# Patient Record
Sex: Female | Born: 1935 | Race: Black or African American | Hispanic: No | Smoking: Former smoker
Health system: Southern US, Community
[De-identification: ages and names within clinical notes are randomized; demographics above are authoritative.]

## PROBLEM LIST (undated history)

## (undated) DIAGNOSIS — I10 Essential (primary) hypertension: Secondary | ICD-10-CM

## (undated) DIAGNOSIS — I5032 Chronic diastolic (congestive) heart failure: Secondary | ICD-10-CM

## (undated) DIAGNOSIS — R609 Edema, unspecified: Secondary | ICD-10-CM

## (undated) DIAGNOSIS — C801 Malignant (primary) neoplasm, unspecified: Secondary | ICD-10-CM

## (undated) DIAGNOSIS — J90 Pleural effusion, not elsewhere classified: Secondary | ICD-10-CM

## (undated) DIAGNOSIS — E213 Hyperparathyroidism, unspecified: Secondary | ICD-10-CM

## (undated) DIAGNOSIS — M4624 Osteomyelitis of vertebra, thoracic region: Secondary | ICD-10-CM

## (undated) DIAGNOSIS — E7800 Pure hypercholesterolemia, unspecified: Secondary | ICD-10-CM

## (undated) DIAGNOSIS — M4644 Discitis, unspecified, thoracic region: Secondary | ICD-10-CM

## (undated) DIAGNOSIS — I509 Heart failure, unspecified: Secondary | ICD-10-CM

## (undated) DIAGNOSIS — J189 Pneumonia, unspecified organism: Secondary | ICD-10-CM

## (undated) DIAGNOSIS — R531 Weakness: Secondary | ICD-10-CM

## (undated) DIAGNOSIS — I119 Hypertensive heart disease without heart failure: Secondary | ICD-10-CM

## (undated) DIAGNOSIS — N184 Chronic kidney disease, stage 4 (severe): Secondary | ICD-10-CM

## (undated) DIAGNOSIS — N289 Disorder of kidney and ureter, unspecified: Secondary | ICD-10-CM

## (undated) HISTORY — PX: ROTATOR CUFF REPAIR: SHX139

## (undated) HISTORY — PX: TONSILLECTOMY: SUR1361

## (undated) HISTORY — DX: Osteomyelitis of vertebra, thoracic region: M46.24

## (undated) HISTORY — DX: Discitis, unspecified, thoracic region: M46.44

## (undated) HISTORY — DX: Hyperparathyroidism, unspecified: E21.3

## (undated) HISTORY — DX: Chronic diastolic (congestive) heart failure: I50.32

## (undated) HISTORY — DX: Pleural effusion, not elsewhere classified: J90

## (undated) HISTORY — DX: Hypertensive heart disease without heart failure: I11.9

## (undated) HISTORY — DX: Pneumonia, unspecified organism: J18.9

## (undated) HISTORY — PX: TUBAL LIGATION: SHX77

## (undated) HISTORY — DX: Chronic kidney disease, stage 4 (severe): N18.4

## (undated) HISTORY — DX: Weakness: R53.1

---

## 1998-10-11 ENCOUNTER — Other Ambulatory Visit: Admission: RE | Admit: 1998-10-11 | Discharge: 1998-10-11 | Payer: Self-pay | Admitting: Family Medicine

## 1998-10-19 ENCOUNTER — Ambulatory Visit (HOSPITAL_COMMUNITY): Admission: RE | Admit: 1998-10-19 | Discharge: 1998-10-19 | Payer: Self-pay | Admitting: Gastroenterology

## 1998-12-01 ENCOUNTER — Encounter: Admission: RE | Admit: 1998-12-01 | Discharge: 1999-03-01 | Payer: Self-pay | Admitting: Orthopedic Surgery

## 2000-09-23 ENCOUNTER — Encounter: Admission: RE | Admit: 2000-09-23 | Discharge: 2000-09-23 | Payer: Self-pay | Admitting: Orthopedic Surgery

## 2000-09-23 ENCOUNTER — Encounter: Payer: Self-pay | Admitting: Orthopedic Surgery

## 2000-09-24 ENCOUNTER — Ambulatory Visit (HOSPITAL_BASED_OUTPATIENT_CLINIC_OR_DEPARTMENT_OTHER): Admission: RE | Admit: 2000-09-24 | Discharge: 2000-09-25 | Payer: Self-pay | Admitting: Orthopedic Surgery

## 2000-12-17 ENCOUNTER — Encounter: Payer: Self-pay | Admitting: Emergency Medicine

## 2000-12-17 ENCOUNTER — Inpatient Hospital Stay (HOSPITAL_COMMUNITY): Admission: EM | Admit: 2000-12-17 | Discharge: 2000-12-23 | Payer: Self-pay | Admitting: Emergency Medicine

## 2000-12-18 ENCOUNTER — Encounter: Payer: Self-pay | Admitting: Family Medicine

## 2002-11-06 ENCOUNTER — Encounter: Admission: RE | Admit: 2002-11-06 | Discharge: 2002-11-06 | Payer: Self-pay | Admitting: Family Medicine

## 2002-11-06 ENCOUNTER — Encounter: Payer: Self-pay | Admitting: Family Medicine

## 2002-12-24 ENCOUNTER — Encounter: Admission: RE | Admit: 2002-12-24 | Discharge: 2003-03-24 | Payer: Self-pay

## 2004-08-11 ENCOUNTER — Emergency Department (HOSPITAL_COMMUNITY): Admission: EM | Admit: 2004-08-11 | Discharge: 2004-08-11 | Payer: Self-pay | Admitting: Emergency Medicine

## 2012-04-08 ENCOUNTER — Emergency Department (HOSPITAL_COMMUNITY)
Admission: EM | Admit: 2012-04-08 | Discharge: 2012-04-08 | Disposition: A | Discharge status: Home Health | Payer: 59 | Source: Home / Self Care | Attending: Family Medicine | Admitting: Family Medicine

## 2012-04-08 ENCOUNTER — Encounter (HOSPITAL_COMMUNITY): Payer: Self-pay

## 2012-04-08 DIAGNOSIS — R609 Edema, unspecified: Secondary | ICD-10-CM

## 2012-04-08 DIAGNOSIS — R600 Localized edema: Secondary | ICD-10-CM

## 2012-04-08 HISTORY — DX: Pure hypercholesterolemia, unspecified: E78.00

## 2012-04-08 HISTORY — DX: Essential (primary) hypertension: I10

## 2012-04-08 LAB — POCT I-STAT, CHEM 8
BUN: 25 mg/dL — ABNORMAL HIGH (ref 6–23)
Creatinine, Ser: 1.5 mg/dL — ABNORMAL HIGH (ref 0.50–1.10)
Glucose, Bld: 91 mg/dL (ref 70–99)
Hemoglobin: 12.6 g/dL (ref 12.0–15.0)
Sodium: 143 mEq/L (ref 135–145)
TCO2: 26 mmol/L (ref 0–100)

## 2012-04-08 NOTE — ED Notes (Signed)
Pt c/o edema to bilateral lower extremities, onset 2 weeks ago.   Pt denies SOB.  Pt states they are painful to palpation.  2+ pulses palpated bilaterally.  Pt currently on antihypertensive with HCTZ.

## 2012-04-08 NOTE — ED Provider Notes (Signed)
History     CSN: [id number]  Arrival date & time 04/08/12  1626   None     Chief Complaint  Patient presents with  . Leg Swelling    (Consider location/radiation/quality/duration/timing/severity/associated sxs/prior treatment) Patient is a 78 y.o. female presenting with extremity pain. The history is provided by the patient. No language interpreter was used.  Extremity Pain This is a new problem. The current episode started 2 days ago. The problem occurs constantly. The problem has not changed since onset.Pertinent negatives include no shortness of breath. The symptoms are aggravated by nothing. The symptoms are relieved by nothing. She has tried nothing for the symptoms. The treatment provided no relief.  Pt complains of swelling to both of her feet.  Pt is visiting here from [place].  Pt reports she has been sitting with feet down more often and may have gotten increased salt from fast food she has been eating.   Past Medical History  Diagnosis Date  . Hypertension   . Gout   . Hypercholesteremia   . Diabetes mellitus     Past Surgical History  Procedure Date  . Cesarean section   . Rotator cuff repair   . Tonsillectomy   . Cholecystectomy   . Tubal ligation     No family history on file.  History  Substance Use Topics  . Smoking status: Former Games developer  . Smokeless tobacco: Not on file  . Alcohol Use: No    OB History    Grav Para Term Preterm Abortions TAB SAB Ect Mult Living                  Review of Systems  Respiratory: Negative for cough and shortness of breath.   Musculoskeletal: Positive for joint swelling.  Skin: Negative for rash.  All other systems reviewed and are negative.    Allergies  Motrin  Home Medications   Current Outpatient Rx  Name Route Sig Dispense Refill  . ALLOPURINOL 300 MG PO TABS Oral Take 300 mg by mouth daily.    Marland Kitchen LOSARTAN POTASSIUM 25 MG PO TABS Oral Take 25 mg by mouth daily.    Marland Kitchen METFORMIN HCL 1000 MG PO TABS  Oral Take 1,000 mg by mouth 2 (two) times daily with a meal.    . METOPROLOL SUCCINATE ER 50 MG PO TB24 Oral Take 50 mg by mouth daily. Take with or immediately following a meal.    . SIMVASTATIN 20 MG PO TABS Oral Take 20 mg by mouth every evening.      BP 152/71  Pulse 78  Temp(Src) 98.6 F (37 C) (Oral)  Resp 24  SpO2 97%  Physical Exam  Nursing note and vitals reviewed. Constitutional: She appears well-developed and well-nourished.  HENT:  Head: Normocephalic and atraumatic.  Eyes: Pupils are equal, round, and reactive to light.  Cardiovascular: Normal rate and normal heart sounds.   Pulmonary/Chest: Effort normal.  Abdominal: Soft. Bowel sounds are normal.  Musculoskeletal: Normal range of motion.  Neurological: She is alert.  Skin: Skin is warm.  Psychiatric: She has a normal mood and affect.    ED Course  Procedures (including critical care time)  Labs Reviewed - No data to display No results found.   No diagnosis found.    MDM  Pt advised of labs,  Elevate feet,  Restrict salt.   Pt advised to see her Md to have her BUn and creatine rechecked.        Elson Areas,  PA 04/08/12 1921

## 2012-04-08 NOTE — Discharge Instructions (Signed)
Peripheral Edema You have swelling in your legs (peripheral edema). This swelling is due to excess accumulation of salt and water in your body. Edema may be a sign of heart, kidney or liver disease, or a side effect of a medication. It may also be due to problems in the leg veins. Elevating your legs and using special support stockings may be very helpful, if the cause of the swelling is due to poor venous circulation. Avoid long periods of standing, whatever the cause. Treatment of edema depends on identifying the cause. Chips, pretzels, pickles and other salty foods should be avoided. Restricting salt in your diet is almost always needed. Water pills (diuretics) are often used to remove the excess salt and water from your body via urine. These medicines prevent the kidney from reabsorbing sodium. This increases urine flow. Diuretic treatment may also result in lowering of potassium levels in your body. Potassium supplements may be needed if you have to use diuretics daily. Daily weights can help you keep track of your progress in clearing your edema. You should call your caregiver for follow up care as recommended. SEEK IMMEDIATE MEDICAL CARE IF:   You have increased swelling, pain, redness, or heat in your legs.   You develop shortness of breath, especially when lying down.   You develop chest or abdominal pain, weakness, or fainting.   You have a fever.  Document Released: 12/13/2004 Document Revised: 10/25/2011 Document Reviewed: 11/23/2009 Exit[employer] Patient Information 2012 [employer]. 

## 2012-04-09 NOTE — ED Provider Notes (Signed)
Medical screening examination/treatment/procedure(s) were performed by non-physician practitioner and as supervising physician I was immediately available for consultation/collaboration.   MORENO-COLL,Azrael Huss; MD   Aycen Porreca Moreno-Coll, MD 04/09/12 1114 

## 2013-11-19 ENCOUNTER — Inpatient Hospital Stay (HOSPITAL_COMMUNITY)
Admission: EM | Admit: 2013-11-19 | Discharge: 2013-11-26 | DRG: 519 | Disposition: A | Discharge status: Skilled Nursing Facility | Payer: Medicare HMO | Attending: Internal Medicine | Admitting: Internal Medicine

## 2013-11-19 ENCOUNTER — Encounter (HOSPITAL_COMMUNITY): Payer: Self-pay | Admitting: Emergency Medicine

## 2013-11-19 ENCOUNTER — Emergency Department (HOSPITAL_COMMUNITY): Payer: Medicare HMO

## 2013-11-19 DIAGNOSIS — M549 Dorsalgia, unspecified: Secondary | ICD-10-CM | POA: Diagnosis present

## 2013-11-19 DIAGNOSIS — M519 Unspecified thoracic, thoracolumbar and lumbosacral intervertebral disc disorder: Principal | ICD-10-CM | POA: Diagnosis present

## 2013-11-19 DIAGNOSIS — N184 Chronic kidney disease, stage 4 (severe): Secondary | ICD-10-CM

## 2013-11-19 DIAGNOSIS — Y92009 Unspecified place in unspecified non-institutional (private) residence as the place of occurrence of the external cause: Secondary | ICD-10-CM

## 2013-11-19 DIAGNOSIS — M4640 Discitis, unspecified, site unspecified: Secondary | ICD-10-CM

## 2013-11-19 DIAGNOSIS — Z87891 Personal history of nicotine dependence: Secondary | ICD-10-CM

## 2013-11-19 DIAGNOSIS — I10 Essential (primary) hypertension: Secondary | ICD-10-CM | POA: Diagnosis present

## 2013-11-19 DIAGNOSIS — R7881 Bacteremia: Secondary | ICD-10-CM

## 2013-11-19 DIAGNOSIS — M109 Gout, unspecified: Secondary | ICD-10-CM | POA: Diagnosis present

## 2013-11-19 DIAGNOSIS — IMO0002 Reserved for concepts with insufficient information to code with codable children: Secondary | ICD-10-CM | POA: Diagnosis present

## 2013-11-19 DIAGNOSIS — I129 Hypertensive chronic kidney disease with stage 1 through stage 4 chronic kidney disease, or unspecified chronic kidney disease: Secondary | ICD-10-CM | POA: Diagnosis present

## 2013-11-19 DIAGNOSIS — E1169 Type 2 diabetes mellitus with other specified complication: Secondary | ICD-10-CM | POA: Diagnosis present

## 2013-11-19 DIAGNOSIS — R8290 Unspecified abnormal findings in urine: Secondary | ICD-10-CM | POA: Diagnosis present

## 2013-11-19 DIAGNOSIS — W07XXXA Fall from chair, initial encounter: Secondary | ICD-10-CM | POA: Diagnosis present

## 2013-11-19 DIAGNOSIS — M869 Osteomyelitis, unspecified: Secondary | ICD-10-CM | POA: Diagnosis present

## 2013-11-19 DIAGNOSIS — N179 Acute kidney failure, unspecified: Secondary | ICD-10-CM | POA: Diagnosis not present

## 2013-11-19 DIAGNOSIS — M9080 Osteopathy in diseases classified elsewhere, unspecified site: Secondary | ICD-10-CM | POA: Diagnosis present

## 2013-11-19 DIAGNOSIS — Z9181 History of falling: Secondary | ICD-10-CM

## 2013-11-19 DIAGNOSIS — Z8249 Family history of ischemic heart disease and other diseases of the circulatory system: Secondary | ICD-10-CM

## 2013-11-19 DIAGNOSIS — E876 Hypokalemia: Secondary | ICD-10-CM | POA: Diagnosis present

## 2013-11-19 MED ORDER — ONDANSETRON 4 MG PO TBDP
4.0000 mg | ORAL_TABLET | Freq: Once | ORAL | Status: AC
  Administered 2013-11-19: 4 mg via ORAL
  Filled 2013-11-19: qty 1

## 2013-11-19 NOTE — ED Notes (Addendum)
Presents with lumbar back pain worse with standing began after falling 3 weeks ago, was not seen at doctor for fall, pain has increasing worsened over the last 3 weeks. Nothing makes better.  Pain is associated with weakness in the legs. CMS intact to lower extremities. Denies loss of bowel and bladder. Reports constipation. Pain rated 10/10, pt is tearful.  denies trouble urinating and painful urination

## 2013-11-20 ENCOUNTER — Inpatient Hospital Stay (HOSPITAL_COMMUNITY): Payer: Medicare HMO

## 2013-11-20 ENCOUNTER — Encounter (HOSPITAL_COMMUNITY): Payer: Self-pay | Admitting: *Deleted

## 2013-11-20 DIAGNOSIS — N184 Chronic kidney disease, stage 4 (severe): Secondary | ICD-10-CM | POA: Diagnosis present

## 2013-11-20 DIAGNOSIS — R8290 Unspecified abnormal findings in urine: Secondary | ICD-10-CM | POA: Diagnosis present

## 2013-11-20 DIAGNOSIS — I10 Essential (primary) hypertension: Secondary | ICD-10-CM

## 2013-11-20 DIAGNOSIS — E876 Hypokalemia: Secondary | ICD-10-CM | POA: Diagnosis present

## 2013-11-20 DIAGNOSIS — M549 Dorsalgia, unspecified: Secondary | ICD-10-CM | POA: Diagnosis present

## 2013-11-20 HISTORY — DX: Chronic kidney disease, stage 4 (severe): N18.4

## 2013-11-20 LAB — BASIC METABOLIC PANEL
BUN: 32 mg/dL — ABNORMAL HIGH (ref 6–23)
BUN: 30 mg/dL — ABNORMAL HIGH (ref 6–23)
CHLORIDE: 102 meq/L (ref 96–112)
CHLORIDE: 105 meq/L (ref 96–112)
CO2: 31 meq/L (ref 19–32)
CO2: 27 meq/L (ref 19–32)
Calcium: 10.8 mg/dL — ABNORMAL HIGH (ref 8.4–10.5)
Calcium: 10.1 mg/dL (ref 8.4–10.5)
Creatinine, Ser: 1.85 mg/dL — ABNORMAL HIGH (ref 0.50–1.10)
Creatinine, Ser: 1.77 mg/dL — ABNORMAL HIGH (ref 0.50–1.10)
GFR calc Af Amer: 28 mL/min — ABNORMAL LOW (ref >90)
GFR calc non Af Amer: 24 mL/min — ABNORMAL LOW (ref >90)
GFR calc non Af Amer: 26 mL/min — ABNORMAL LOW (ref >90)
GFR, EST AFRICAN AMERICAN: 30 mL/min — AB (ref >90)
Glucose, Bld: 125 mg/dL — ABNORMAL HIGH (ref 70–99)
Glucose, Bld: 108 mg/dL — ABNORMAL HIGH (ref 70–99)
POTASSIUM: 3.9 meq/L (ref 3.7–5.3)
Potassium: 3.6 mEq/L — ABNORMAL LOW (ref 3.7–5.3)
SODIUM: 144 meq/L (ref 137–147)
SODIUM: 144 meq/L (ref 137–147)

## 2013-11-20 LAB — CBC
HCT: 35.2 % — ABNORMAL LOW (ref 36.0–46.0)
Hemoglobin: 11.4 g/dL — ABNORMAL LOW (ref 12.0–15.0)
MCH: 27.8 pg (ref 26.0–34.0)
MCHC: 32.4 g/dL (ref 30.0–36.0)
MCV: 85.9 fL (ref 78.0–100.0)
PLATELETS: 171 10*3/uL (ref 150–400)
RBC: 4.1 MIL/uL (ref 3.87–5.11)
RDW: 14.8 % (ref 11.5–15.5)
WBC: 9 10*3/uL (ref 4.0–10.5)

## 2013-11-20 LAB — URINALYSIS, ROUTINE W REFLEX MICROSCOPIC
BILIRUBIN URINE: NEGATIVE
GLUCOSE, UA: NEGATIVE mg/dL
Hgb urine dipstick: NEGATIVE
KETONES UR: NEGATIVE mg/dL
Nitrite: NEGATIVE
PH: 5 (ref 5.0–8.0)
Protein, ur: NEGATIVE mg/dL
Specific Gravity, Urine: 1.016 (ref 1.005–1.030)
Urobilinogen, UA: 0.2 mg/dL (ref 0.0–1.0)

## 2013-11-20 LAB — CBC WITH DIFFERENTIAL/PLATELET
BASOS ABS: 0.1 10*3/uL (ref 0.0–0.1)
Basophils Relative: 1 % (ref 0–1)
Eosinophils Absolute: 0.2 10*3/uL (ref 0.0–0.7)
Eosinophils Relative: 3 % (ref 0–5)
HEMATOCRIT: 37.3 % (ref 36.0–46.0)
Hemoglobin: 12.1 g/dL (ref 12.0–15.0)
LYMPHS PCT: 34 % (ref 12–46)
Lymphs Abs: 2.9 10*3/uL (ref 0.7–4.0)
MCH: 27.9 pg (ref 26.0–34.0)
MCHC: 32.4 g/dL (ref 30.0–36.0)
MCV: 86.1 fL (ref 78.0–100.0)
MONO ABS: 0.7 10*3/uL (ref 0.1–1.0)
Monocytes Relative: 8 % (ref 3–12)
NEUTROS ABS: 4.8 10*3/uL (ref 1.7–7.7)
NEUTROS PCT: 55 % (ref 43–77)
Platelets: 202 10*3/uL (ref 150–400)
RBC: 4.33 MIL/uL (ref 3.87–5.11)
RDW: 14.7 % (ref 11.5–15.5)
WBC: 8.7 10*3/uL (ref 4.0–10.5)

## 2013-11-20 LAB — URINE MICROSCOPIC-ADD ON

## 2013-11-20 LAB — HEMOGLOBIN A1C
Hgb A1c MFr Bld: 6.8 % — ABNORMAL HIGH (ref <5.7)
Mean Plasma Glucose: 148 mg/dL — ABNORMAL HIGH (ref <117)

## 2013-11-20 LAB — SEDIMENTATION RATE: SED RATE: 27 mm/h — AB (ref 0–22)

## 2013-11-20 LAB — TSH: TSH: 1.225 u[IU]/mL (ref 0.350–4.500)

## 2013-11-20 LAB — GLUCOSE, CAPILLARY: GLUCOSE-CAPILLARY: 99 mg/dL (ref 70–99)

## 2013-11-20 LAB — PRO B NATRIURETIC PEPTIDE: PRO B NATRI PEPTIDE: 894.4 pg/mL — AB (ref 0–450)

## 2013-11-20 LAB — CG4 I-STAT (LACTIC ACID): LACTIC ACID, VENOUS: 0.68 mmol/L (ref 0.5–2.2)

## 2013-11-20 MED ORDER — VANCOMYCIN HCL IN DEXTROSE 1-5 GM/200ML-% IV SOLN: 1000.0000 mg | INTRAVENOUS | Status: DC

## 2013-11-20 MED ORDER — HYDROCODONE-ACETAMINOPHEN 5-325 MG PO TABS
1.0000 | ORAL_TABLET | ORAL | Status: DC | PRN
  Administered 2013-11-20 (×3): 1 via ORAL
  Administered 2013-11-21: 2 via ORAL
  Administered 2013-11-21: 1 via ORAL
  Administered 2013-11-21 – 2013-11-26 (×18): 2 via ORAL
  Administered 2013-11-26: 1 via ORAL
  Administered 2013-11-26: 2 via ORAL
  Filled 2013-11-20 (×12): qty 2
  Filled 2013-11-20 (×2): qty 1
  Filled 2013-11-20 (×3): qty 2
  Filled 2013-11-20: qty 1
  Filled 2013-11-20 (×5): qty 2
  Filled 2013-11-20: qty 1
  Filled 2013-11-20: qty 2

## 2013-11-20 MED ORDER — SODIUM CHLORIDE 0.9 % IV SOLN
INTRAVENOUS | Status: DC
  Administered 2013-11-20 – 2013-11-25 (×4): via INTRAVENOUS

## 2013-11-20 MED ORDER — HYDROMORPHONE HCL PF 1 MG/ML IJ SOLN
0.5000 mg | INTRAMUSCULAR | Status: DC | PRN
  Administered 2013-11-25: 0.5 mg via INTRAVENOUS
  Filled 2013-11-20 (×2): qty 1

## 2013-11-20 MED ORDER — ENOXAPARIN SODIUM 30 MG/0.3ML ~~LOC~~ SOLN
30.0000 mg | Freq: Every day | SUBCUTANEOUS | Status: DC
  Administered 2013-11-20 – 2013-11-21 (×2): 30 mg via SUBCUTANEOUS
  Filled 2013-11-20 (×2): qty 0.3

## 2013-11-20 MED ORDER — ONDANSETRON HCL 4 MG/2ML IJ SOLN
4.0000 mg | Freq: Four times a day (QID) | INTRAMUSCULAR | Status: DC | PRN
  Administered 2013-11-25: 4 mg via INTRAVENOUS
  Filled 2013-11-20: qty 2

## 2013-11-20 MED ORDER — ONDANSETRON HCL 4 MG PO TABS: 4.0000 mg | ORAL_TABLET | Freq: Four times a day (QID) | ORAL | Status: DC | PRN

## 2013-11-20 MED ORDER — SODIUM CHLORIDE 0.9 % IJ SOLN
10.0000 mL | Freq: Two times a day (BID) | INTRAMUSCULAR | Status: DC
  Administered 2013-11-23: 40 mL
  Administered 2013-11-24: 10 mL
  Administered 2013-11-26: 20 mL

## 2013-11-20 MED ORDER — VANCOMYCIN HCL 10 G IV SOLR
1500.0000 mg | Freq: Once | INTRAVENOUS | Status: DC
  Filled 2013-11-20: qty 1500

## 2013-11-20 MED ORDER — MORPHINE SULFATE 4 MG/ML IJ SOLN
4.0000 mg | Freq: Once | INTRAMUSCULAR | Status: AC
  Administered 2013-11-20: 4 mg via INTRAVENOUS
  Filled 2013-11-20: qty 1

## 2013-11-20 MED ORDER — ADULT MULTIVITAMIN W/MINERALS CH
1.0000 | ORAL_TABLET | Freq: Every day | ORAL | Status: DC
  Administered 2013-11-20 – 2013-11-26 (×7): 1 via ORAL
  Filled 2013-11-20 (×8): qty 1

## 2013-11-20 MED ORDER — GLUCERNA SHAKE PO LIQD
237.0000 mL | Freq: Two times a day (BID) | ORAL | Status: DC
  Administered 2013-11-20 – 2013-11-26 (×9): 237 mL via ORAL
  Filled 2013-11-20 (×2): qty 237

## 2013-11-20 MED ORDER — SODIUM CHLORIDE 0.9 % IJ SOLN
10.0000 mL | INTRAMUSCULAR | Status: DC | PRN
  Administered 2013-11-20 – 2013-11-26 (×8): 10 mL

## 2013-11-20 MED ORDER — PIPERACILLIN-TAZOBACTAM 3.375 G IVPB
3.3750 g | Freq: Three times a day (TID) | INTRAVENOUS | Status: DC
  Administered 2013-11-20 – 2013-11-21 (×3): 3.375 g via INTRAVENOUS
  Filled 2013-11-20 (×6): qty 50

## 2013-11-20 NOTE — ED Notes (Signed)
IV team at bedside 

## 2013-11-20 NOTE — Progress Notes (Signed)
INITIAL NUTRITION ASSESSMENT  DOCUMENTATION CODES Per approved criteria  -Morbid Obesity   INTERVENTION: Provide Glucerna Shakes BID in between meals Provide Multivitamin with minerals daily Encourage PO intake  NUTRITION DIAGNOSIS: Inadequate oral intake related to decreased appetite as evidenced by 12% weight loss in 8 months per pt report.   Goal: Pt to meet >/= 90% of their estimated nutrition needs   Monitor:  PO intake Weight Labs  Reason for Assessment: Malnutrition Screening Tool, score of 3  80 y.o. female  Admitting Dx: <principal problem not specified>  ASSESSMENT: 80 year old female with history of hypertension, hypercholesterolemia, diabetes and gout who presents with back pain after a fall that occurred several hours PTA. Per family, pt had three other falls at home this past year and has been progressively weaker.  Pt reports having a poor appetite and eating much less for the past month. She reports losing weight over the past 8 months due to eating less; she states she weighed 260 lbs 8 months ago. Per pt's daughter at bedside pt ate about 30% of breakfast and pt recently started drinking Glucerna Shakes at home due to poor po intake and weight loss. 12% weight loss in 8 months per pt report- not significant for malnutrition criteria.   Height: Ht Readings from Last 1 Encounters:  11/20/13 5\' 2"  (1.575 m)    Weight: Wt Readings from Last 1 Encounters:  11/20/13 228 lb 6.3 oz (103.6 kg)    Ideal Body Weight: 110 lbs  % Ideal Body Weight: 207%  Wt Readings from Last 10 Encounters:  11/20/13 228 lb 6.3 oz (103.6 kg)    Usual Body Weight: 260 lbs  % Usual Body Weight: 88%  BMI:  Body mass index is 41.76 kg/(m^2).  Estimated Nutritional Needs: Kcal: 1700-1860 Protein: 80-90 grams Fluid: > 1.9 L/day  Skin: skin tear on left lower pretibial  Diet Order: General  EDUCATION NEEDS: -No education needs identified at this time   Intake/Output  Summary (Last 24 hours) at 11/20/13 1202 Last data filed at 11/20/13 0400  Gross per 24 hour  Intake    240 ml  Output      0 ml  Net    240 ml    Last BM: 12/32  Labs:   Recent Labs Lab 11/20/13 0130 11/20/13 0530  NA 144 144  K 3.9 3.6*  CL 102 105  CO2 31 27  BUN 32* 30*  CREATININE 1.85* 1.77*  CALCIUM 10.8* 10.1  GLUCOSE 125* 108*    CBG (last 3)   Recent Labs  11/20/13 0422  GLUCAP 99    Scheduled Meds: . enoxaparin (LOVENOX) injection  30 mg Subcutaneous Daily  . piperacillin-tazobactam (ZOSYN)  IV  3.375 g Intravenous Q8H  . sodium chloride  10-40 mL Intracatheter Q12H  . vancomycin  1,500 mg Intravenous Once  . [START ON 11/22/2013] vancomycin  1,000 mg Intravenous Q48H    Continuous Infusions: . sodium chloride      Past Medical History  Diagnosis Date  . Hypertension   . Gout   . Hypercholesteremia   . Diabetes mellitus     Past Surgical History  Procedure Laterality Date  . Cesarean section    . Rotator cuff repair    . Tonsillectomy    . Tubal ligation      Pryor Ochoa RD, LDN Inpatient Clinical Dietitian Pager: [phone] After Hours Pager: [phone]

## 2013-11-20 NOTE — ED Notes (Signed)
IV team attempted IV start without success, as well as PA with ultrasound. Pt without IV access at this time.

## 2013-11-20 NOTE — Progress Notes (Signed)
ANTIBIOTIC CONSULT NOTE - INITIAL  Pharmacy Consult for Vancomycin and Zosyn  Indication: osteomyelitis  Allergies  Allergen Reactions  . Motrin [Ibuprofen] Hives    Patient Measurements: Height: 5\' 2"  (157.5 cm) Weight: 228 lb 6.3 oz (103.6 kg) IBW/kg (Calculated) : 50.1 Adjusted Body Weight: 70 kg  Vital Signs: Temp: 98.4 F (36.9 C) (01/02 0409) Temp src: Oral (01/02 0409) BP: 172/99 mmHg (01/02 0409) Pulse Rate: 88 (01/02 0409) Intake/Output from previous day: 01/01 0701 - 01/02 0700 In: 240 [P.O.:240] Out: -  Intake/Output from this shift: Total I/O In: 240 [P.O.:240] Out: -   Labs:  Recent Labs  11/20/13 0130  WBC 8.7  HGB 12.1  PLT 202  CREATININE 1.85*   Estimated Creatinine Clearance: 26.9 ml/min (by C-G formula based on Cr of 1.85). No results found for this basename: VANCOTROUGH, VANCOPEAK, VANCORANDOM, GENTTROUGH, GENTPEAK, GENTRANDOM, TOBRATROUGH, TOBRAPEAK, TOBRARND, AMIKACINPEAK, AMIKACINTROU, AMIKACIN,  in the last 72 hours   Microbiology: No results found for this or any previous visit (from the past 720 hour(s)).  Medical History: Past Medical History  Diagnosis Date  . Hypertension   . Gout   . Hypercholesteremia   . Diabetes mellitus     Medications:  Prescriptions prior to admission  Medication Sig Dispense Refill  . PRESCRIPTION MEDICATION Take 1 tablet by mouth every morning. Blood Pressure      . traMADol (ULTRAM) 50 MG tablet Take 50 mg by mouth every 6 (six) hours as needed.       Assessment: 80 yo female with back pain and possible osteomyelitis for empiric antibiotics  Goal of Therapy:  Vancomycin trough level 15-20 mcg/ml  Plan:  Vancomycin 1500 mg IV now, then 1 g IV q48h Zosyn 3.375 g IV q8h  Rahn Lacuesta, Bronson Curb 11/20/2013,4:51 AM

## 2013-11-20 NOTE — Progress Notes (Cosign Needed)
[facility] Progress Note [place] TEAM [address]    DABNEY DEVER PQZ:[id number] DOB: [birth year] DOA: 11/19/2013 PCP: No PCP Per Patient  Brief narrative: 80 year old female patient with history of hypertension diabetes and gout who presented with back pain after fall that occurred several hours prior to arrival. Patient described the pain as persistent sharp 10 out of 10 in severity worse with movement. The fall was very minimal and occurred when the patient was attempting to sit down in a recliner chair and did not sit far not back into the chair and slid down out of the chair to the floor. According to the family the patient's had 3 other falls at home this year has become progressively weaker. X-ray in the ER was concerning for possible discitis. It is noted that the patient has had no fever or leukocytosis at presentation.  Assessment/Plan: Active Problems:   Back pain -MRI pending -Patient still with significant back pain but able to maneuver self on to bed pan out any apparent neurological deficit -Review of previous MRI in 2003 revealed patient had significant lower thoracic disc disease with noted disc desiccation and bulging and stenosis -Continue empiric antibiotics for possible discitis    Abnormal urinalysis -Urinalysis appears consistent with UTI -Since no fever or leukocytosis will not begin empiric antibiotics and instead will follow up on culture    CKD (chronic kidney disease) stage 4, GFR 15-29 ml/min -Baseline creatinine unknown    HTN (hypertension)    Hypokalemia -Replete as needed   DVT prophylaxis: Lovenox Code Status: Full Family Communication: Daughter at bedside Disposition Plan/Expected LOS: Remain inpatient until source of back pain clearly delineated Isolation: None Nutritional Status: Stable  Consultants: None  Procedures: None  Antibiotics: Zosyn 1/1 >>> Vancomycin 1/1 >>>  HPI/Subjective: Patient alert and complaining of the same low  back pain.  Objective: Blood pressure 159/61, pulse 89, temperature 99.3 F (37.4 C), temperature source Oral, resp. rate 17, height 5\' 2"  (1.575 m), weight 228 lb 6.3 oz (103.6 kg), SpO2 92.00%.  Intake/Output Summary (Last 24 hours) at 11/20/13 1530 Last data filed at 11/20/13 1400  Gross per 24 hour  Intake 366.67 ml  Output      0 ml  Net 366.67 ml     Exam: Followup exam completed noting patient admitted at 4:31 AM today  Scheduled Meds:  Scheduled Meds: . enoxaparin (LOVENOX) injection  30 mg Subcutaneous Daily  . feeding supplement (GLUCERNA SHAKE)  237 mL Oral BID BM  . multivitamin with minerals  1 tablet Oral Daily  . piperacillin-tazobactam (ZOSYN)  IV  3.375 g Intravenous Q8H  . sodium chloride  10-40 mL Intracatheter Q12H  . vancomycin  1,500 mg Intravenous Once  . [START ON 11/22/2013] vancomycin  1,000 mg Intravenous Q48H   Continuous Infusions: . sodium chloride 50 mL/hr at 11/20/13 1228    **Reviewed in detail by the Attending Physician  Data Reviewed: Basic Metabolic Panel:  Recent Labs Lab 11/20/13 0130 11/20/13 0530  NA 144 144  K 3.9 3.6*  CL 102 105  CO2 31 27  GLUCOSE 125* 108*  BUN 32* 30*  CREATININE 1.85* 1.77*  CALCIUM 10.8* 10.1   Liver Function Tests: No results found for this basename: AST, ALT, ALKPHOS, BILITOT, PROT, ALBUMIN,  in the last 168 hours No results found for this basename: LIPASE, AMYLASE,  in the last 168 hours No results found for this basename: AMMONIA,  in the last 168 hours CBC:  Recent Labs Lab 11/20/13  0130 11/20/13 0530  WBC 8.7 9.0  NEUTROABS 4.8  --   HGB 12.1 11.4*  HCT 37.3 35.2*  MCV 86.1 85.9  PLT 202 171   Cardiac Enzymes: No results found for this basename: CKTOTAL, CKMB, CKMBINDEX, TROPONINI,  in the last 168 hours BNP (last 3 results)  Recent Labs  11/20/13 0530  PROBNP 894.4*   CBG:  Recent Labs Lab 11/20/13 0422  GLUCAP 99    No results found for this or any previous visit  (from the past 240 hour(s)).   Studies:  Recent x-ray studies have been reviewed in detail by the Attending Physician     Erin Hearing, ANP [facility] Office  [phone] Pager [phone]  **If unable to reach the above provider after paging please contact the [place] @ [phone]  On-Call/Text Page:      [email or web address]      password [email or web address]  If 7PM-7AM, please contact night-coverage [email or web address] Password [email or web address] 11/20/2013, 3:30 PM   LOS: 1 day

## 2013-11-20 NOTE — Progress Notes (Signed)
Peripherally Inserted Central Catheter/Midline Placement  The IV Nurse has discussed with the patient and/or persons authorized to consent for the patient, the purpose of this procedure and the potential benefits and risks involved with this procedure.  The benefits include less needle sticks, lab draws from the catheter and patient may be discharged home with the catheter.  Risks include, but not limited to, infection, bleeding, blood clot (thrombus formation), and puncture of an artery; nerve damage and irregular heat beat.  Alternatives to this procedure were also discussed. BY Yarnell  PICC/Midline Placement Documentation  PICC / Midline Double Lumen 11/20/13 PICC Right Cephalic 40 cm 1 cm (Active)  Indication for Insertion or Continuance of Line Poor Vasculature-patient has had multiple peripheral attempts or PIVs lasting less than 24 hours 11/20/2013 11:57 AM  Exposed Catheter (cm) 1 cm 11/20/2013 11:57 AM  Site Assessment Clean;Dry;Intact 11/20/2013 11:57 AM  Lumen #1 Status Flushed;Saline locked;Blood return noted 11/20/2013 11:57 AM  Lumen #2 Status Flushed;Saline locked;Blood return noted 11/20/2013 11:57 AM  Dressing Type Transparent 11/20/2013 11:57 AM  Dressing Intervention New dressing 11/20/2013 11:57 AM  Dressing Change Due 11/27/13 11/20/2013 11:57 AM       Gordan Payment 11/20/2013, 11:59 AM

## 2013-11-20 NOTE — ED Notes (Addendum)
Attempted iv start x2. IV team paged. Pt also placed on bedpan but unable to use.

## 2013-11-20 NOTE — ED Notes (Signed)
Pt returned from xray

## 2013-11-20 NOTE — Plan of Care (Signed)
Problem: Phase I Progression Outcomes Goal: Hemodynamically stable Outcome: Progressing Patient admitted to floor from [facility] ED with back pain s/p multiple falls in recent weeks.  She is alert and oriented x4, in no apparent distress.  She is capable to transferring from stretcher to bed with assistance.  Has back pain 7/10.  Will give PRN analgesics for relief.  Discussed plan of care with patient and daughter.  No concerns voiced.  Will continue to monitor patient condition.

## 2013-11-20 NOTE — H&P (Signed)
[facility] History and Physical  ANTWAN BRIBIESCA YNW:[id number] DOB: 1930/06/08 DOA: 11/19/2013  Referring physician: ED physician PCP: No PCP Per Patient   Chief Complaint: back pain   HPI:  Patient is an 80 year old female with history of hypertension, hypercholesterolemia, diabetes and gout who presents with back pain after a fall that occurred several hours PTA. Pt describes pain as persistent and sharp, 10/10 in severity, worse with movement and no specific alleviating symptoms. NO specific radiating symptom but it does involve almost entire back area. She was with family trying to get into a recliner chair when she was sitting too close to the edge and slid down from the chair. Per family, pt had three other falls at home this past year and has been progressively weaker. Pt denies chest pain, shortness of breath, no specific abdominal or urinary concerns. [facility] asked to admit for further evaluation given findings on XRAY imaging of possible discitis vs osteomyelitis.   Assessment and Plan: Active Problems:   Back pain - ? Discitis/osteomyelitis - needs MRI for further evaluation - this will be done in AM - in the meantime will place on Vancomycin and Zosyn - analgesia as needed, bed rest   Acute renal failure - likely pre renal in etiology - will place on IVF and repeat BMP in AM - encourage PO intake when able to tolerate    HTN - place on Hydralazine scheduled and as needed   Hypercalcemia - IVF as noted above and repeat BMP in AM   DM - will place on SSI for now   Code Status: Full Family Communication: Pt at bedside Disposition Plan:  Admit to medical floor    Review of Systems:  Constitutional: Negative for fever, chills and malaise/fatigue. Negative for diaphoresis.  HENT: Negative for hearing loss, ear pain, nosebleeds, congestion, sore throat, neck pain, tinnitus and ear discharge.   Eyes: Negative for blurred vision, double vision, photophobia, pain, discharge  and redness.  Respiratory: Negative for cough, hemoptysis, sputum production, shortness of breath, wheezing and stridor.   Cardiovascular: Negative for chest pain, palpitations, orthopnea, claudication and leg swelling.  Gastrointestinal: Negative for nausea, vomiting and abdominal pain. Negative for heartburn, constipation, blood in stool and melena.  Genitourinary: Negative for dysuria, urgency, frequency, hematuria and flank pain.  Musculoskeletal: Per HPI  Skin: Negative for itching and rash.  Neurological: Negative for tingling, tremors, sensory change, speech change, focal weakness, loss of consciousness and headaches.  Endo/Heme/Allergies: Negative for environmental allergies and polydipsia. Does not bruise/bleed easily.  Psychiatric/Behavioral: Negative for suicidal ideas. The patient is not nervous/anxious.      Past Medical History  Diagnosis Date  . Hypertension   . Gout   . Hypercholesteremia   . Diabetes mellitus     Past Surgical History  Procedure Laterality Date  . Cesarean section    . Rotator cuff repair    . Tonsillectomy    . Cholecystectomy    . Tubal ligation      Social History:  reports that she has quit smoking. She does not have any smokeless tobacco history on file. She reports that she does not drink alcohol or use illicit drugs.  Allergies  Allergen Reactions  . Motrin [Ibuprofen] Hives    History reviewed. No pertinent family history.  Prior to Admission medications   Medication Sig Start Date End Date Taking? Authorizing Provider  PRESCRIPTION MEDICATION Take 1 tablet by mouth every morning. Blood Pressure   Yes Historical Provider, MD  traMADol (  ULTRAM) 50 MG tablet Take 50 mg by mouth every 6 (six) hours as needed.   Yes Historical Provider, MD    Physical Exam: Filed Vitals:   11/19/13 2308 11/20/13 0116 11/20/13 0305 11/20/13 0409  BP: 191/79  158/74 172/99  Pulse: 88  88 88  Temp: 97.9 F (36.6 C) 99.5 F (37.5 C)  98.4 F (36.9  C)  TempSrc: Oral Oral  Oral  Resp: 20  18 16   Height:    5\' 2"  (1.575 m)  Weight: 99.791 kg (220 lb)   103.6 kg (228 lb 6.3 oz)  SpO2: 96%  95% 94%    Physical Exam  Constitutional: Appears well-developed and well-nourished. No distress.  HENT: Normocephalic. External right and left ear normal. Oropharynx is clear and moist.  Eyes: Conjunctivae and EOM are normal. PERRLA, no scleral icterus.  Neck: Normal ROM. Neck supple. No JVD. No tracheal deviation. No thyromegaly.  CVS: RRR, S1/S2 +, no murmurs, no gallops, no carotid bruit.  Pulmonary: Effort and breath sounds normal, no stridor, rhonchi, wheezes, rales.  Abdominal: Soft. BS +,  no distension, tenderness, rebound or guarding.  Musculoskeletal: Normal range of motion. Diffuse back area tenderness with examination  Lymphadenopathy: No lymphadenopathy noted, cervical, inguinal. Neuro: Alert. Normal reflexes, muscle tone coordination. No cranial nerve deficit. Skin: Skin is warm and dry. No rash noted. Not diaphoretic. No erythema. No pallor.  Psychiatric: Normal mood and affect. Behavior, judgment, thought content normal.   Labs on Admission:  Basic Metabolic Panel:  Recent Labs Lab 11/20/13 0130  NA 144  K 3.9  CL 102  CO2 31  GLUCOSE 125*  BUN 32*  CREATININE 1.85*  CALCIUM 10.8*   CBC:  Recent Labs Lab 11/20/13 0130  WBC 8.7  NEUTROABS 4.8  HGB 12.1  HCT 37.3  MCV 86.1  PLT 202   Radiological Exams on Admission: Dg Thoracic Spine W/swimmers   11/20/2013    1. Highly unusual appearance of T10 and T11, as above, which is concern for potential discitis/osteomyelitis. Given the patient's history of recent fall, these findings could simply be related to acute trauma, however, the marked irregularity of the endplates at this level raises concern for infection.  2. The appearance of the lung parenchyma suggests an underlying interstitial lung disease  Dg Lumbar Spine Complete   11/20/2013    1. The appearance of  T10 and T11 remains concerning for potential discitis/osteomyelitis, and correlation with MRI of the thoracic spine with and without IV gadolinium is strongly recommended.  2. No acute radiographic abnormality of the lumbar spine itself.    EKG: Normal sinus rhythm, no ST/T wave changes  Faye Ramsay, MD  [facility] Pager [phone]  If 7PM-7AM, please contact night-coverage [email or web address] Password [email or web address] 11/20/2013, 4:23 AM

## 2013-11-20 NOTE — ED Notes (Addendum)
Spoke with PA Dammen regarding patient not having IV access. PA explained that admitting MD will be ordering a PICC line placed.

## 2013-11-20 NOTE — ED Provider Notes (Signed)
CSN: [id number]     Arrival date & time 11/19/13  2302 History   First MD Initiated Contact with Patient 11/19/13 2315     Chief Complaint  Patient presents with  . Back Pain   HPI  History provided by the patient and family. Patient is an 80 year old female with history of hypertension, hypercholesterolemia, diabetes and gout who presents with back pain after a fall. Patient reports having a fall earlier this morning with persistent and worsened back pain. She was with family trying to get into a recliner chair when she was sitting too close to the edge and slid down from the chair. She had significant difficulty even with help getting back up due to pains in her back. The family reports that patient has had 3 other recent falls and daily continuous back pain and decreased activity. Patient's first fall was on November 27. At that time she states she was getting up to go to the floor in the living room and "tripped"falling to the ground. She states she had significant back pains and difficulty getting up on her own. If she remained on the floor for approximately 45 minutes but was eventually able to get up. Since then she has been using a walker to help with mobility. She also takes Tylenol for pains which she has been having daily. Her next fall was on December 7 again when she was in the living room walking by the couch. She states her legs just gave out and she went down to the floor. On December 30 she was with family who are helping her up the steps when her leg gave out and they helped her down to the floor. She was having significant difficulty getting up on that occasion as well. She denies any numbness to her legs. She does occasionally have pain down the right posterior leg to the thigh. Denies any urinary or fecal incontinence, urinary retention or perineal numbness. No fever, chills or sweats.   Past Medical History  Diagnosis Date  . Hypertension   . Gout   . Hypercholesteremia   .  Diabetes mellitus    Past Surgical History  Procedure Laterality Date  . Cesarean section    . Rotator cuff repair    . Tonsillectomy    . Cholecystectomy    . Tubal ligation     History reviewed. No pertinent family history. History  Substance Use Topics  . Smoking status: Former Research scientist (life sciences)  . Smokeless tobacco: Not on file  . Alcohol Use: No   OB History   Grav Para Term Preterm Abortions TAB SAB Ect Mult Living                 Review of Systems  Constitutional: Negative for fever, chills and diaphoresis.  Musculoskeletal: Positive for back pain.  Neurological: Positive for weakness. Negative for numbness.  All other systems reviewed and are negative.    Allergies  Motrin  Home Medications   Current Outpatient Rx  Name  Route  Sig  Dispense  Refill  . PRESCRIPTION MEDICATION   Oral   Take 1 tablet by mouth every morning. Blood Pressure         . traMADol (ULTRAM) 50 MG tablet   Oral   Take 50 mg by mouth every 6 (six) hours as needed.          BP 191/79  Pulse 88  Temp(Src) 97.9 F (36.6 C) (Oral)  Resp 20  Wt 220 lb (99.791 kg)  SpO2 96% Physical Exam  Nursing note and vitals reviewed. Constitutional: She is oriented to person, place, and time. She appears well-developed and well-nourished. No distress.  HENT:  Head: Normocephalic.  Cardiovascular: Normal rate and regular rhythm.   Pulmonary/Chest: Effort normal and breath sounds normal. No respiratory distress. She has no wheezes. She has no rales.  Abdominal: Soft.  Musculoskeletal:       Lumbar back: She exhibits tenderness.       Back:  Skin appears normal without erythema. There are some healing excoriations lower on the skin. No other rash. No swelling. no deformity. Patient is significant obese.  Neurological: She is alert and oriented to person, place, and time. No sensory deficit.  Strength equal in lower feet and ankles. Normal sensations to light touch.   Skin: Skin is warm and dry. No  rash noted.  Psychiatric: She has a normal mood and affect. Her behavior is normal.    ED Course  Procedures   DIAGNOSTIC STUDIES: Oxygen Saturation is 96% on room air.    COORDINATION OF CARE:  Nursing notes reviewed. Vital signs reviewed. Initial pt interview and examination performed.   1:07 AM-patient seen and evaluated. Patient laying on her stomach appears uncomfortable. No acute distress. Discussed work up plan with pt at bedside, which includes lab testing admission and MRI later in the morning. Pt agrees with plan.  Patient discussed with attending physician. We'll plan on admission so patient may have MRI and further followup and treatment in the morning.  Spoke with [facility] hospital[facility]. They will see patient and admit. Would like holding orders for MedSurg bed.    Results for orders placed during the hospital encounter of 11/19/13  CBC WITH DIFFERENTIAL      Result Value Range   WBC 8.7  4.0 - 10.5 K/uL   RBC 4.33  3.87 - 5.11 MIL/uL   Hemoglobin 12.1  12.0 - 15.0 g/dL   HCT 37.3  36.0 - 46.0 %   MCV 86.1  78.0 - 100.0 fL   MCH 27.9  26.0 - 34.0 pg   MCHC 32.4  30.0 - 36.0 g/dL   RDW 14.7  11.5 - 15.5 %   Platelets 202  150 - 400 K/uL   Neutrophils Relative % 55  43 - 77 %   Neutro Abs 4.8  1.7 - 7.7 K/uL   Lymphocytes Relative 34  12 - 46 %   Lymphs Abs 2.9  0.7 - 4.0 K/uL   Monocytes Relative 8  3 - 12 %   Monocytes Absolute 0.7  0.1 - 1.0 K/uL   Eosinophils Relative 3  0 - 5 %   Eosinophils Absolute 0.2  0.0 - 0.7 K/uL   Basophils Relative 1  0 - 1 %   Basophils Absolute 0.1  0.0 - 0.1 K/uL  BASIC METABOLIC PANEL      Result Value Range   Sodium 144  137 - 147 mEq/L   Potassium 3.9  3.7 - 5.3 mEq/L   Chloride 102  96 - 112 mEq/L   CO2 31  19 - 32 mEq/L   Glucose, Bld 125 (*) 70 - 99 mg/dL   BUN 32 (*) 6 - 23 mg/dL   Creatinine, Ser 1.85 (*) 0.50 - 1.10 mg/dL   Calcium 10.8 (*) 8.4 - 10.5 mg/dL   GFR calc non Af Amer 24 (*) >90 mL/min   GFR calc Af  Amer 28 (*) >90 mL/min  SEDIMENTATION RATE  Result Value Range   Sed Rate 27 (*) 0 - 22 mm/hr  GLUCOSE, CAPILLARY      Result Value Range   Glucose-Capillary 99  70 - 99 mg/dL   Comment 1 Notify RN    CG4 I-STAT (LACTIC ACID)      Result Value Range   Lactic Acid, Venous 0.68  0.5 - 2.2 mmol/L     Imaging Review Dg Thoracic Spine W/swimmers  11/20/2013   CLINICAL DATA:  History of fall yesterday.  Difficulty moving.  EXAM: THORACIC SPINE - 2 VIEW + SWIMMERS  COMPARISON:  No priors.  FINDINGS: Irregularity of the inferior endplate of O24 and superior endplate of M35 is highly irregular and concerning for discitis/osteomyelitis. There is some compression of both of these vertebral bodies with approximately 50% loss of height at both levels, and mild acute kyphotic deformity centered at T10-T11. There is mild multilevel degenerative disc disease, including what appears to be complete bony fusion between T8 and T9. Incidentally imaged portions of the thorax demonstrate diffuse interstitial prominence throughout the lungs bilaterally, most pronounced in the periphery of the lungs, concerning for the underlying interstitial lung disease.  IMPRESSION: 1. Highly unusual appearance of T10 and T11, as above, which is concern for potential discitis/osteomyelitis. Given the patient's history of recent fall, these findings could simply be related to acute trauma, however, the marked irregularity of the endplates at this level raises concern for infection. Correlation with MRI of the thoracic spine with and without IV gadolinium is recommended. 2. The appearance of the lung parenchyma suggests an underlying interstitial lung disease. This could be better evaluated with followup nonemergent high-resolution chest CT if clinically appropriate. These results were called by telephone at the time of interpretation on 11/20/2013 at 12:23 AM toPETER Dinara Lupu, PA, who verbally acknowledged these results.   Electronically  Signed   By: Vinnie Langton M.D.   On: 11/20/2013 00:25   Dg Lumbar Spine Complete  11/20/2013   CLINICAL DATA:  History of fall complaining of difficulty moving.  EXAM: LUMBAR SPINE - COMPLETE 4+ VIEW  COMPARISON:  No priors.  FINDINGS: Five views of the lumbar spine demonstrate no definite acute displaced fracture or compression type fracture in the lumbar spine. Alignment is anatomic. Mild multilevel degenerative disc disease.  Incidentally imaged portions of the lower thoracic spine again demonstrate marked irregularity of T10 and T11 where there is evidence of bony destruction of the inferior endplate of T61 and superior endplate of W43 with widening of the intervening disc space, highly concerning for discitis and osteomyelitis. At both of these levels there is significant loss of vertebral body height (approximately 50%).  IMPRESSION: 1. The appearance of T10 and T11 remains concerning for potential discitis/osteomyelitis, and correlation with MRI of the thoracic spine with and without IV gadolinium is strongly recommended. 2. No acute radiographic abnormality of the lumbar spine itself.   Electronically Signed   By: Vinnie Langton M.D.   On: 11/20/2013 00:28     MDM   1. Back pain        Martie Lee, PA-C 11/20/13 [phone]

## 2013-11-20 NOTE — ED Provider Notes (Signed)
Medical screening examination/treatment/procedure(s) were performed by non-physician practitioner and as supervising physician I was immediately available for consultation/collaboration.  EKG Interpretation    Date/Time:    Ventricular Rate:    PR Interval:    QRS Duration:   QT Interval:    QTC Calculation:   R Axis:     Text Interpretation:                Hoy Morn, MD 11/20/13 [phone]

## 2013-11-21 DIAGNOSIS — E119 Type 2 diabetes mellitus without complications: Secondary | ICD-10-CM

## 2013-11-21 DIAGNOSIS — M519 Unspecified thoracic, thoracolumbar and lumbosacral intervertebral disc disorder: Secondary | ICD-10-CM

## 2013-11-21 DIAGNOSIS — I129 Hypertensive chronic kidney disease with stage 1 through stage 4 chronic kidney disease, or unspecified chronic kidney disease: Secondary | ICD-10-CM

## 2013-11-21 LAB — CBC
HCT: 34.5 % — ABNORMAL LOW (ref 36.0–46.0)
Hemoglobin: 10.8 g/dL — ABNORMAL LOW (ref 12.0–15.0)
MCH: 27.3 pg (ref 26.0–34.0)
MCHC: 31.3 g/dL (ref 30.0–36.0)
MCV: 87.1 fL (ref 78.0–100.0)
Platelets: 177 10*3/uL (ref 150–400)
RBC: 3.96 MIL/uL (ref 3.87–5.11)
RDW: 15.1 % (ref 11.5–15.5)
WBC: 8.3 10*3/uL (ref 4.0–10.5)

## 2013-11-21 LAB — BASIC METABOLIC PANEL
BUN: 30 mg/dL — AB (ref 6–23)
CHLORIDE: 108 meq/L (ref 96–112)
CO2: 27 mEq/L (ref 19–32)
Calcium: 10 mg/dL (ref 8.4–10.5)
Creatinine, Ser: 1.96 mg/dL — ABNORMAL HIGH (ref 0.50–1.10)
GFR calc non Af Amer: 23 mL/min — ABNORMAL LOW (ref >90)
GFR, EST AFRICAN AMERICAN: 26 mL/min — AB (ref >90)
GLUCOSE: 100 mg/dL — AB (ref 70–99)
Potassium: 3.9 mEq/L (ref 3.7–5.3)
Sodium: 145 mEq/L (ref 137–147)

## 2013-11-21 LAB — URINE CULTURE
Colony Count: 30000
Special Requests: NORMAL

## 2013-11-21 LAB — GLUCOSE, CAPILLARY
GLUCOSE-CAPILLARY: 109 mg/dL — AB (ref 70–99)
GLUCOSE-CAPILLARY: 130 mg/dL — AB (ref 70–99)

## 2013-11-21 LAB — VANCOMYCIN, RANDOM: Vancomycin Rm: <5 ug/mL

## 2013-11-21 MED ORDER — CARVEDILOL 12.5 MG PO TABS
12.5000 mg | ORAL_TABLET | Freq: Two times a day (BID) | ORAL | Status: DC
  Administered 2013-11-21 – 2013-11-24 (×6): 12.5 mg via ORAL
  Filled 2013-11-21 (×10): qty 1

## 2013-11-21 MED ORDER — ENOXAPARIN SODIUM 30 MG/0.3ML ~~LOC~~ SOLN
30.0000 mg | Freq: Every day | SUBCUTANEOUS | Status: DC
  Administered 2013-11-23 – 2013-11-25 (×3): 30 mg via SUBCUTANEOUS
  Filled 2013-11-21 (×3): qty 0.3

## 2013-11-21 MED ORDER — INSULIN ASPART 100 UNIT/ML ~~LOC~~ SOLN: 0.0000 [IU] | Freq: Every day | SUBCUTANEOUS | Status: DC

## 2013-11-21 MED ORDER — HYDRALAZINE HCL 20 MG/ML IJ SOLN
10.0000 mg | INTRAMUSCULAR | Status: DC | PRN
  Administered 2013-11-21 – 2013-11-25 (×6): 10 mg via INTRAVENOUS
  Filled 2013-11-21 (×6): qty 1

## 2013-11-21 MED ORDER — INSULIN ASPART 100 UNIT/ML ~~LOC~~ SOLN
0.0000 [IU] | Freq: Three times a day (TID) | SUBCUTANEOUS | Status: DC
  Administered 2013-11-22 – 2013-11-25 (×6): 1 [IU] via SUBCUTANEOUS
  Administered 2013-11-25: 2 [IU] via SUBCUTANEOUS
  Administered 2013-11-25 – 2013-11-26 (×2): 1 [IU] via SUBCUTANEOUS

## 2013-11-21 MED ORDER — VANCOMYCIN HCL 10 G IV SOLR: 1500.0000 mg | INTRAVENOUS | Status: DC

## 2013-11-21 MED ORDER — VANCOMYCIN HCL 10 G IV SOLR
2000.0000 mg | INTRAVENOUS | Status: AC
  Administered 2013-11-21: 2000 mg via INTRAVENOUS
  Filled 2013-11-21: qty 2000

## 2013-11-21 MED ORDER — VANCOMYCIN HCL 10 G IV SOLR
1500.0000 mg | INTRAVENOUS | Status: DC
  Administered 2013-11-23: 1500 mg via INTRAVENOUS
  Filled 2013-11-21 (×2): qty 1500

## 2013-11-21 MED ORDER — WHITE PETROLATUM GEL
Status: AC
  Administered 2013-11-22: 0.2
  Filled 2013-11-21: qty 5

## 2013-11-21 MED ORDER — FUROSEMIDE 40 MG PO TABS
40.0000 mg | ORAL_TABLET | Freq: Two times a day (BID) | ORAL | Status: DC
  Administered 2013-11-21: 40 mg via ORAL
  Filled 2013-11-21: qty 1

## 2013-11-21 NOTE — Progress Notes (Signed)
Notified Dr Dillard Essex of pt's increased BP (206/75) and that pt requesting to have her CBG monitored.  New orders received, will cont to monitor.

## 2013-11-21 NOTE — Progress Notes (Addendum)
[facility] Progress Note     [name] [name]:[id number] DOB: [birth year] DOA: 11/19/2013 PCP: No PCP Per Patient  Brief narrative: 80 year old female patient with history of hypertension diabetes and gout who presented with back pain after fall that occurred several hours prior to arrival. Patient described the pain as persistent sharp 10 out of 10 in severity worse with movement. The fall was very minimal and occurred when the patient was attempting to sit down in a recliner chair and did not sit far not back into the chair and slid down out of the chair to the floor. According to the family the patient's had 3 other falls at home this year has become progressively weaker. X-ray in the ER was concerning for possible discitis. It is noted that the patient has had no fever or leukocytosis at presentation.  Assessment/Plan: Active Problems:   Back pain/T10-T11 discitis/osteo -MRI of thoracic spine with severe chronic-appearing T10-T11 discitis osteomyelitis was discharged on a T10 and T11 vertebral bodies, extension of the dorsal epidural without discrete abscess. Moderate canal stenosis at T10-11 with severe neural foramina all narrowing-please see full MRI report for details -Patient had been started on empiric antibiotics on 1/1 but following consultation with ID with a discussion of the above MRI results Dr. Baxter Flattery recommended to discontinue antibiotics and have radiology to do disc aspiration to send for cultures -Following the above lab called with preliminary blood cultures showing 1/2 bottles with GPC in clusters, and on followup discussion with Dr. Baxter Flattery she recommended to resume vancomycin even though IR can only do the aspirate in a.m. (as patient already received prophylactic Lovenox earlier this a.m.) -Hold a.m. dose of in anticipation of this aspiration in a.m. -I have consulted neurosurgery as recommended per ID for further recommendations   Abnormal urinalysis -Urine cultures  only with multiple bacterial morphotypes present, none predominant   CKD (chronic kidney disease) stage 4, GFR 15-29 ml/min -Baseline creatinine unknown, creatinine 1.96 this a.m. continue to monitor closely on vanc -Continuing to worsen may need further evaluation/renal consulted -Will hold Lasix for now.   HTN (hypertension), uncontrolled -Resume Coreg, when necessary nitroglycerin and follow   Hypokalemia -Resolved, follow Bacteremia GPC in clusters -1/2 -Discussed with Dr. Baxter Flattery and since patient reported fevers/chills prior to admission -she recommends to restart vancomycin at this time.  DVT prophylaxis: Lovenox Code Status: Full Family Communication: Updated patient &Daughter at bedside Disposition Plan/Expected LOS: Remain inpatient until source of back pain clearly delineated   Consultants: Infectious disease eval pending Neurosurgery eval pending  Procedures: None  Antibiotics: Zosyn 1/1 >>>1/3 Vancomycin 1/1 >>>  HPI/Subjective: Patient alert and complaining of low back pain- but states okay control with meds  Objective: Blood pressure 176/81, pulse 82, temperature 98.2 F (36.8 C), temperature source Oral, resp. rate 18, height 5\' 2"  (1.575 m), weight 103.6 kg (228 lb 6.3 oz), SpO2 92.00%.  Intake/Output Summary (Last 24 hours) at 11/21/13 1053 Last data filed at 11/21/13 0549  Gross per 24 hour  Intake  917.5 ml  Output      0 ml  Net  917.5 ml      Exam:  General: alert & oriented x 3 In NAD Cardiovascular: RRR, nl S1 s2 Respiratory: CTAB Abdomen: soft +BS NT/ND, no masses palpable Back: Tenderness in mid back area,decreased range of motion due to pain. Extremities: No cyanosis and no edema    Scheduled Meds:  Scheduled Meds: . enoxaparin (LOVENOX) injection  30 mg Subcutaneous Daily  . feeding  supplement (GLUCERNA SHAKE)  237 mL Oral BID BM  . multivitamin with minerals  1 tablet Oral Daily  . sodium chloride  10-40 mL Intracatheter Q12H   . vancomycin  2,000 mg Intravenous STAT   Continuous Infusions: . sodium chloride 50 mL/hr at 11/20/13 1228     Data Reviewed: Basic Metabolic Panel:  Recent Labs Lab 11/20/13 0130 11/20/13 0530 11/21/13 0500  NA 144 144 145  K 3.9 3.6* 3.9  CL 102 105 108  CO2 31 27 27   GLUCOSE 125* 108* 100*  BUN 32* 30* 30*  CREATININE 1.85* 1.77* 1.96*  CALCIUM 10.8* 10.1 10.0   Liver Function Tests: No results found for this basename: AST, ALT, ALKPHOS, BILITOT, PROT, ALBUMIN,  in the last 168 hours No results found for this basename: LIPASE, AMYLASE,  in the last 168 hours No results found for this basename: AMMONIA,  in the last 168 hours CBC:  Recent Labs Lab 11/20/13 0130 11/20/13 0530 11/21/13 0500  WBC 8.7 9.0 8.3  NEUTROABS 4.8  --   --   HGB 12.1 11.4* 10.8*  HCT 37.3 35.2* 34.5*  MCV 86.1 85.9 87.1  PLT 202 171 177   Cardiac Enzymes: No results found for this basename: CKTOTAL, CKMB, CKMBINDEX, TROPONINI,  in the last 168 hours BNP (last 3 results)  Recent Labs  11/20/13 0530  PROBNP 894.4*   CBG:  Recent Labs Lab 11/20/13 0422  GLUCAP 99    No results found for this or any previous visit (from the past 240 hour(s)).   Studies:  I have reviewed all Recent  Labs and x-ray studies in detail.    Minette Headland MD [facility] Office  [phone] Pager [phone]  **If unable to reach the above provider after paging please contact the [facility] @ [phone]  On-Call/Text Page:      [email or web address]      password [email or web address]  If 7PM-7AM, please contact night-coverage [email or web address] Password [email or web address] 11/21/2013, 10:53 AM   LOS: 2 days

## 2013-11-21 NOTE — Progress Notes (Signed)
RN Spoke with Rod Holler from pharmacy about resuming the Vanc consult.  Explained to her that I'd only stopped the infusing this morning when Dr Dillard Essex notified me to stop it until ID consult.  Now that blood cultures are back, ID advised to restart the Vanc.  Will continue infusing the bag that was hung earlier and held. Lowella Bandy Ward

## 2013-11-21 NOTE — Progress Notes (Signed)
ANTIBIOTIC CONSULT NOTE - FOLLOW UP  Pharmacy Consult for Vanco/Zosyn Indication: Osteo  Allergies  Allergen Reactions  . Motrin [Ibuprofen] Hives    Patient Measurements: Height: 5\' 2"  (157.5 cm) Weight: 228 lb 6.3 oz (103.6 kg) IBW/kg (Calculated) : 50.1 Adjusted Body Weight:    Vital Signs: Temp: 98.2 F (36.8 C) (01/03 0548) Temp src: Oral (01/03 0548) BP: 176/81 mmHg (01/03 0548) Pulse Rate: 82 (01/03 0548) Intake/Output from previous day: 01/02 0701 - 01/03 0700 In: 917.5 [I.V.:867.5; IV Piggyback:50] Out: -  Intake/Output from this shift:    Labs:  Recent Labs  11/20/13 0130 11/20/13 0530 11/21/13 0500  WBC 8.7 9.0 8.3  HGB 12.1 11.4* 10.8*  PLT 202 171 177  CREATININE 1.85* 1.77* 1.96*   Estimated Creatinine Clearance: 25.4 ml/min (by C-G formula based on Cr of 1.96).  Recent Labs  11/21/13 0840  VANCORANDOM <5.0     Microbiology: No results found for this or any previous visit (from the past 720 hour(s)).  Anti-infectives   Start     Dose/Rate Route Frequency Ordered Stop   11/23/13 1000  vancomycin (VANCOCIN) 1,500 mg in sodium chloride 0.9 % 500 mL IVPB     1,500 mg 250 mL/hr over 120 Minutes Intravenous Every 48 hours 11/21/13 0950     11/22/13 0600  vancomycin (VANCOCIN) IVPB 1000 mg/200 mL premix  Status:  Discontinued     1,000 mg 200 mL/hr over 60 Minutes Intravenous Every 48 hours 11/20/13 0455 11/21/13 0950   11/21/13 1000  vancomycin (VANCOCIN) 2,000 mg in sodium chloride 0.9 % 500 mL IVPB     2,000 mg 250 mL/hr over 120 Minutes Intravenous STAT 11/21/13 0950 11/22/13 1000   11/20/13 0600  vancomycin (VANCOCIN) 1,500 mg in sodium chloride 0.9 % 500 mL IVPB  Status:  Discontinued     1,500 mg 250 mL/hr over 120 Minutes Intravenous  Once 11/20/13 0455 11/21/13 0949   11/20/13 0530  piperacillin-tazobactam (ZOSYN) IVPB 3.375 g     3.375 g 12.5 mL/hr over 240 Minutes Intravenous 3 times per day 11/20/13 0455         Assessment: Admit Complaint: Back pain s/p fall.  Anticoagulation: Lovenox 30mg /d. CrCl 28. Hgb down to 10.8. Plts 177 ok.  Infectious Disease Vancomycin and Zosyn for poss osteo/discitis. 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 Tmax 99.3. WBC 8.3. Scr 1.96 (up). F/u cultures. Dose of Vancomycin was not charted as given 1/2. Random Vancomycin level =<5 indicating that Vancomycin was not given yesterday.  1/2 Vanco>> 1/2 Zosyn>>  Cardiovascular: Htn HLD. 176/81, HR 82. No meds Endocrinology: DM Gout. CBG 99, 100. No meds Gastrointestinal / Nutrition: Glucerna, MV, regular diet. Neurology Nephrology: ARF with Scr up to 1.96. Pulmonary: 92% Hematology / Oncology: Hgb trending down to 10.8. PTA Medication Issues Best Practices   Goal of Therapy:  Vancomycin trough level 15-20 mcg/ml  Plan:  Vancomycin 2000 mg IV stat now, then 1500 g IV q48h  Zosyn 3.375 g IV q8h  Analeigha Nauman S. Alford Highland, PharmD, [facility] Clinical Staff Pharmacist Pager [phone]    Eilene Ghazi Stillinger 11/21/2013,9:51 AM

## 2013-11-21 NOTE — Consult Note (Signed)
[facility]  Total days of antibiotics 2        Day 1 vanco        Day 2 piptazo               Reason for Consult:osteo/discitis   Referring Physician:viyuoh  Active Problems:   Back pain   CKD (chronic kidney disease) stage 4, GFR 15-29 ml/min   HTN (hypertension)   Abnormal urinalysis   Hypokalemia    HPI: [name] [name] is a 80 y.o. female with HTN, DM and CKD 4 who has reports repeated ground level falls due to weakness and worsening back pain that became extreme and led her to be admitted to the hospital on 11/19/13. She states that she does not recall any recent illnesses but has had chills for roughly a week. No overt nightsweats. She also has had some new onset right leg weakness. She has recently moved to [place] from [place] to be closer to her daughter. In the ED she was found to have no fever, no leukocytosis but MRI findings concerning for osteo/diskitis with T10-T11 endplate destruction and prevertebral phlegmon. She was seen by neurosurgery who felt no surgical intervention needed but recommended aspiration of tissue/fluid for culture and med manage for chronic thoracic osteo.  Past Medical History  Diagnosis Date  . Hypertension   . Gout   . Hypercholesteremia   . Diabetes mellitus     Allergies:  Allergies  Allergen Reactions  . Motrin [Ibuprofen] Hives    MEDICATIONS: . enoxaparin (LOVENOX) injection  30 mg Subcutaneous Daily  . feeding supplement (GLUCERNA SHAKE)  237 mL Oral BID BM  . multivitamin with minerals  1 tablet Oral Daily  . sodium chloride  10-40 mL Intracatheter Q12H  . vancomycin  2,000 mg Intravenous STAT    History  Substance Use Topics  . Smoking status: Former Research scientist (life sciences)  . Smokeless tobacco: Not on file  . Alcohol Use: No    Family History  Problem Relation Age of Onset  . Hypertension Mother   . Hypertension Father   . Hypertension Daughter      Review of Systems  Constitutional: Negative for  fever, chills, diaphoresis, activity change, appetite change, fatigue and unexpected weight change.  HENT: Negative for congestion, sore throat, rhinorrhea, sneezing, trouble swallowing and sinus pressure.  Eyes: Negative for photophobia and visual disturbance.  Respiratory: Negative for cough, chest tightness, shortness of breath, wheezing and stridor.  Cardiovascular: Negative for chest pain, palpitations and leg swelling.  Gastrointestinal: Negative for nausea, vomiting, abdominal pain, diarrhea, constipation, blood in stool, abdominal distention and anal bleeding.  Genitourinary: Negative for dysuria, hematuria, flank pain and difficulty urinating.  Musculoskeletal: back pain and right lower leg weakness Skin: Negative for color change, pallor, rash and wound.  Neurological: Negative for dizziness, tremors, weakness and light-headedness.  Hematological: Negative for adenopathy. Does not bruise/bleed easily.  Psychiatric/Behavioral: Negative for behavioral problems, confusion, sleep disturbance, dysphoric mood, decreased concentration and agitation.     OBJECTIVE: Temp:  [98.2 F (36.8 C)-99.3 F (37.4 C)] 98.2 F (36.8 C) (01/03 0548) Pulse Rate:  [82-91] 82 (01/03 0548) Resp:  [17-18] 18 (01/03 0548) BP: (153-176)/(46-81) 176/81 mmHg (01/03 0548) SpO2:  [92 %] 92 % (01/03 0548)  Physical Exam  Constitutional:  oriented to person, place, and time. appears well-developed and well-nourished. No distress.  HENT:  Mouth/Throat: Oropharynx is clear and moist. No oropharyngeal exudate.  Cardiovascular: Normal rate, regular rhythm and normal  heart sounds. Exam reveals no gallop and no friction rub.  No murmur heard.  Pulmonary/Chest: Effort normal and breath sounds normal. No respiratory distress.  no wheezes.  Abdominal: Soft. Bowel sounds are normal.  exhibits no distension. There is no tenderness.  Lymphadenopathy:  no cervical adenopathy.  Neurological:  alert and oriented to  person, place, and time. 5/5 motor in upper and lower extremities bilaterally. Gait not assessed Skin: Skin is warm and dry. No rash noted. No erythema.  Psychiatric: a normal mood and affect.  behavior is normal.    LABS: Results for orders placed during the hospital encounter of 11/19/13 (from the past 48 hour(s))  CBC WITH DIFFERENTIAL     Status: None   Collection Time    11/20/13  1:30 AM      Result Value Range   WBC 8.7  4.0 - 10.5 K/uL   RBC 4.33  3.87 - 5.11 MIL/uL   Hemoglobin 12.1  12.0 - 15.0 g/dL   HCT 37.3  36.0 - 46.0 %   MCV 86.1  78.0 - 100.0 fL   MCH 27.9  26.0 - 34.0 pg   MCHC 32.4  30.0 - 36.0 g/dL   RDW 14.7  11.5 - 15.5 %   Platelets 202  150 - 400 K/uL   Neutrophils Relative % 55  43 - 77 %   Neutro Abs 4.8  1.7 - 7.7 K/uL   Lymphocytes Relative 34  12 - 46 %   Lymphs Abs 2.9  0.7 - 4.0 K/uL   Monocytes Relative 8  3 - 12 %   Monocytes Absolute 0.7  0.1 - 1.0 K/uL   Eosinophils Relative 3  0 - 5 %   Eosinophils Absolute 0.2  0.0 - 0.7 K/uL   Basophils Relative 1  0 - 1 %   Basophils Absolute 0.1  0.0 - 0.1 K/uL  BASIC METABOLIC PANEL     Status: Abnormal   Collection Time    11/20/13  1:30 AM      Result Value Range   Sodium 144  137 - 147 mEq/L   Comment: Please note change in reference range.   Potassium 3.9  3.7 - 5.3 mEq/L   Comment: Please note change in reference range.   Chloride 102  96 - 112 mEq/L   CO2 31  19 - 32 mEq/L   Glucose, Bld 125 (*) 70 - 99 mg/dL   BUN 32 (*) 6 - 23 mg/dL   Creatinine, Ser 1.85 (*) 0.50 - 1.10 mg/dL   Calcium 10.8 (*) 8.4 - 10.5 mg/dL   GFR calc non Af Amer 24 (*) >90 mL/min   GFR calc Af Amer 28 (*) >90 mL/min   Comment: (NOTE)     The eGFR has been calculated using the CKD EPI equation.     This calculation has not been validated in all clinical situations.     eGFR's persistently <90 mL/min signify possible Chronic Kidney     Disease.  SEDIMENTATION RATE     Status: Abnormal   Collection Time     11/20/13  1:30 AM      Result Value Range   Sed Rate 27 (*) 0 - 22 mm/hr  CG4 I-STAT (LACTIC ACID)     Status: None   Collection Time    11/20/13  1:35 AM      Result Value Range   Lactic Acid, Venous 0.68  0.5 - 2.2 mmol/L  GLUCOSE, CAPILLARY  Status: None   Collection Time    11/20/13  4:22 AM      Result Value Range   Glucose-Capillary 99  70 - 99 mg/dL   Comment 1 Notify RN    CBC     Status: Abnormal   Collection Time    11/20/13  5:30 AM      Result Value Range   WBC 9.0  4.0 - 10.5 K/uL   RBC 4.10  3.87 - 5.11 MIL/uL   Hemoglobin 11.4 (*) 12.0 - 15.0 g/dL   HCT 35.2 (*) 36.0 - 46.0 %   MCV 85.9  78.0 - 100.0 fL   MCH 27.8  26.0 - 34.0 pg   MCHC 32.4  30.0 - 36.0 g/dL   RDW 14.8  11.5 - 15.5 %   Platelets 171  150 - 400 K/uL  BASIC METABOLIC PANEL     Status: Abnormal   Collection Time    11/20/13  5:30 AM      Result Value Range   Sodium 144  137 - 147 mEq/L   Comment: Please note change in reference range.   Potassium 3.6 (*) 3.7 - 5.3 mEq/L   Comment: Please note change in reference range.   Chloride 105  96 - 112 mEq/L   CO2 27  19 - 32 mEq/L   Glucose, Bld 108 (*) 70 - 99 mg/dL   BUN 30 (*) 6 - 23 mg/dL   Creatinine, Ser 1.77 (*) 0.50 - 1.10 mg/dL   Calcium 10.1  8.4 - 10.5 mg/dL   GFR calc non Af Amer 26 (*) >90 mL/min   GFR calc Af Amer 30 (*) >90 mL/min   Comment: (NOTE)     The eGFR has been calculated using the CKD EPI equation.     This calculation has not been validated in all clinical situations.     eGFR's persistently <90 mL/min signify possible Chronic Kidney     Disease.  TSH     Status: None   Collection Time    11/20/13  5:30 AM      Result Value Range   TSH 1.225  0.350 - 4.500 uIU/mL   Comment: Performed at [facility]     Status: Abnormal   Collection Time    11/20/13  5:30 AM      Result Value Range   Hemoglobin A1C 6.8 (*) <5.7 %   Comment: (NOTE)                                                                                According to the ADA Clinical Practice Recommendations for 2011, when     HbA1c is used as a screening test:      >=6.5%   Diagnostic of Diabetes Mellitus               (if abnormal result is confirmed)     5.7-6.4%   Increased risk of developing Diabetes Mellitus     References:Diagnosis and Classification of Diabetes Mellitus,Diabetes     JIRC,7893,81(OFBPZ 1):S62-S69 and Standards of Medical Care in             Diabetes -  2011,Diabetes YKDX,8338,25 (Suppl 1):S11-S61.   Mean Plasma Glucose 148 (*) <117 mg/dL   Comment: Performed at [facility]     Status: Abnormal   Collection Time    11/20/13  5:30 AM      Result Value Range   Pro B Natriuretic peptide (BNP) 894.4 (*) 0 - 450 pg/mL  URINALYSIS, ROUTINE W REFLEX MICROSCOPIC     Status: Abnormal   Collection Time    11/20/13 10:07 AM      Result Value Range   Color, Urine YELLOW  YELLOW   APPearance CLOUDY (*) CLEAR   Specific Gravity, Urine 1.016  1.005 - 1.030   pH 5.0  5.0 - 8.0   Glucose, UA NEGATIVE  NEGATIVE mg/dL   Hgb urine dipstick NEGATIVE  NEGATIVE   Bilirubin Urine NEGATIVE  NEGATIVE   Ketones, ur NEGATIVE  NEGATIVE mg/dL   Protein, ur NEGATIVE  NEGATIVE mg/dL   Urobilinogen, UA 0.2  0.0 - 1.0 mg/dL   Nitrite NEGATIVE  NEGATIVE   Leukocytes, UA MODERATE (*) NEGATIVE  URINE MICROSCOPIC-ADD ON     Status: Abnormal   Collection Time    11/20/13 10:07 AM      Result Value Range   Squamous Epithelial / LPF FEW (*) RARE   WBC, UA 7-10  <3 WBC/hpf   RBC / HPF 0-2  <3 RBC/hpf   Bacteria, UA MANY (*) RARE  CBC     Status: Abnormal   Collection Time    11/21/13  5:00 AM      Result Value Range   WBC 8.3  4.0 - 10.5 K/uL   RBC 3.96  3.87 - 5.11 MIL/uL   Hemoglobin 10.8 (*) 12.0 - 15.0 g/dL   HCT 34.5 (*) 36.0 - 46.0 %   MCV 87.1  78.0 - 100.0 fL   MCH 27.3  26.0 - 34.0 pg   MCHC 31.3  30.0 - 36.0 g/dL   RDW 15.1  11.5 - 15.5 %   Platelets 177  150 - 400  K/uL  BASIC METABOLIC PANEL     Status: Abnormal   Collection Time    11/21/13  5:00 AM      Result Value Range   Sodium 145  137 - 147 mEq/L   Comment: Please note change in reference range.   Potassium 3.9  3.7 - 5.3 mEq/L   Comment: Please note change in reference range.   Chloride 108  96 - 112 mEq/L   CO2 27  19 - 32 mEq/L   Glucose, Bld 100 (*) 70 - 99 mg/dL   BUN 30 (*) 6 - 23 mg/dL   Creatinine, Ser 1.96 (*) 0.50 - 1.10 mg/dL   Calcium 10.0  8.4 - 10.5 mg/dL   GFR calc non Af Amer 23 (*) >90 mL/min   GFR calc Af Amer 26 (*) >90 mL/min   Comment: (NOTE)     The eGFR has been calculated using the CKD EPI equation.     This calculation has not been validated in all clinical situations.     eGFR's persistently <90 mL/min signify possible Chronic Kidney     Disease.  VANCOMYCIN, RANDOM     Status: None   Collection Time    11/21/13  8:40 AM      Result Value Range   Vancomycin Rm <5.0     Comment:            Random Vancomycin therapeutic  range is dependent on dosage and     time of specimen collection.     A peak range is 20.0-40.0 ug/mL     A trough range is 5.0-15.0 ug/mL               MICRO:  IMAGING: Dg Thoracic Spine W/swimmers  11/20/2013   CLINICAL DATA:  History of fall yesterday.  Difficulty moving.  EXAM: THORACIC SPINE - 2 VIEW + SWIMMERS  COMPARISON:  No priors.  FINDINGS: Irregularity of the inferior endplate of U27 and superior endplate of O53 is highly irregular and concerning for discitis/osteomyelitis. There is some compression of both of these vertebral bodies with approximately 50% loss of height at both levels, and mild acute kyphotic deformity centered at T10-T11. There is mild multilevel degenerative disc disease, including what appears to be complete bony fusion between T8 and T9. Incidentally imaged portions of the thorax demonstrate diffuse interstitial prominence throughout the lungs bilaterally, most pronounced in the periphery of the lungs,  concerning for the underlying interstitial lung disease.  IMPRESSION: 1. Highly unusual appearance of T10 and T11, as above, which is concern for potential discitis/osteomyelitis. Given the patient's history of recent fall, these findings could simply be related to acute trauma, however, the marked irregularity of the endplates at this level raises concern for infection. Correlation with MRI of the thoracic spine with and without IV gadolinium is recommended. 2. The appearance of the lung parenchyma suggests an underlying interstitial lung disease. This could be better evaluated with followup nonemergent high-resolution chest CT if clinically appropriate. These results were called by telephone at the time of interpretation on 11/20/2013 at 12:23 AM toPETER DAMMEN, PA, who verbally acknowledged these results.   Electronically Signed   By: Vinnie Langton M.D.   On: 11/20/2013 00:25   Dg Lumbar Spine Complete  11/20/2013   CLINICAL DATA:  History of fall complaining of difficulty moving.  EXAM: LUMBAR SPINE - COMPLETE 4+ VIEW  COMPARISON:  No priors.  FINDINGS: Five views of the lumbar spine demonstrate no definite acute displaced fracture or compression type fracture in the lumbar spine. Alignment is anatomic. Mild multilevel degenerative disc disease.  Incidentally imaged portions of the lower thoracic spine again demonstrate marked irregularity of T10 and T11 where there is evidence of bony destruction of the inferior endplate of G64 and superior endplate of Q03 with widening of the intervening disc space, highly concerning for discitis and osteomyelitis. At both of these levels there is significant loss of vertebral body height (approximately 50%).  IMPRESSION: 1. The appearance of T10 and T11 remains concerning for potential discitis/osteomyelitis, and correlation with MRI of the thoracic spine with and without IV gadolinium is strongly recommended. 2. No acute radiographic abnormality of the lumbar spine  itself.   Electronically Signed   By: Vinnie Langton M.D.   On: 11/20/2013 00:28   Mr Lumbar Spine Wo Contrast  11/20/2013   CLINICAL DATA:  Back pain, evaluate for discitis.  EXAM: MRI LUMBAR SPINE WITHOUT CONTRAST  TECHNIQUE: Multiplanar, multisequence MR imaging was performed. No intravenous contrast was administered (low GFR of 30, stage 4 kidney disease).  COMPARISON:  Lumbar spine radiograph November 19, 2013  FINDINGS: Lumbar vertebral bodies and posterior elements are intact and aligned with maintenance of lumbar lordosis. Intervertebral discs demonstrate normal morphology, decreased T2 signal within all disc most consistent mild desiccation. Minimal STIR signal within the L5-S1 facets favoring reactive changes associated with small effusions. No suspicious STIR signal within the  vertebral bodies or discs.  Included view of the thoracic spine demonstrates T11-12 broad-based disc bulge fluid signal within the disc common destructive process of the T11 vertebral body incompletely characterized. Conus medullaris terminates at L1-2 and appears normal in morphology and signal characteristics. Cauda equina is nonsuspicious. Multiple T2 hyperintensities arising from the kidneys may reflect cyst. Multiple small low signal lesions throughout the uterus most consistent with leiomyomata. Partially imaged diverticulosis.  Level by level evaluation:  L1-2: Minimal annular bulging without canal stenosis or neural foraminal narrowing.  L2-3: Small bilateral far lateral disc protrusions. Mild facet arthropathy and ligamentum flavum redundancy. Minimal canal stenosis. Mild bilateral neural foraminal narrowing.  L3-4: 4 mm broad-based disc bulge. Moderate facet arthropathy and ligamentum flavum redundancy with bilateral facet effusions measuring up to 3 mm on the right. Moderate canal stenosis, AP dimension of the thecal sac is 7 mm. Mild to moderate right, mild left neural foraminal narrowing.  L4-5: 2 mm broad-based disc  bulge is superimposed small left far lateral disc protrusion and annular tear. Mild to moderate facet arthropathy and ligamentum flavum redundancy with trace facet effusions which are likely reactive. Mild canal stenosis. Mild bilateral neural foraminal narrowing.  L5-S1 4 mm broad-based disc bulge asymmetric to the right may encroach upon the exited right L5 nerve. Moderate to severe facet arthropathy and ligamentum flavum redundancy with trace facet effusions which are likely reactive. No canal stenosis. Severe right, moderate to severe left neural foraminal narrowing.  IMPRESSION: Abnormal destructive changes of the T10-11 disc, please see dedicated MRI of the thoracic spine from same day, reported separately for further description. No MR findings of discitis osteomyelitis in the lumbar spine.  Degenerative change of the lumbar spine: Moderate canal stenosis at L3-4, mild at L4-5.  Neural foraminal narrowing L2-3 to L5-S1: Severe on the right at L5-S1.  Mildly widened facets at L3-4, which can be associated with dynamic instability and could be further characterize with flexion and extension radiographs if clinically indicated.  Critical Value/emergent results were called by telephone at the time of interpretation on 11/20/2013 at 7:57 PM to Dr. Kathline Magic, who verbally acknowledged these results.   Electronically Signed   By: Elon Alas   On: 11/20/2013 19:58   Mr Thoracic Spine W Wo Contrast  11/20/2013   ADDENDUM REPORT: 11/20/2013 19:58  ADDENDUM: Critical Value/emergent results were called by telephone at the time of interpretation on 11/20/2013 at 7:57 PM to Dr. Kathline Magic, who verbally acknowledged these results.   Electronically Signed   By: Elon Alas   On: 11/20/2013 19:58   11/20/2013   CLINICAL DATA:  Pain, rule out discitis.  EXAM: MRI THORACIC SPINE WITHOUT AND WITH CONTRAST  TECHNIQUE: Multiplanar and multiecho pulse sequences of the thoracic spine were obtained without and with  intravenous contrast.  CONTRAST:  None: Due to low GFR (30) contrast was not administered.  COMPARISON:  Thoracic radiographs November 19, 2013  FINDINGS: Low T1, bright T2 signal intradiscal mass at T10-11 with complete destruction of the T10 body and inferior endplate, T11 body at and superior endplate. Abnormal fluid/phlegmon extends of the prevertebral soft tissues, right pedicle and posterior elements. 4 mm retropulsed disc material and bony fragments, in addition to facet arthropathy results in moderate canal stenosis at T10-11, AP dimension of the canal is 7 mm. Severe T10-11 neural foraminal narrowing. In addition, severe T11-12 disc height loss with fluid signal within the T11-12 disc associated with moderate subacute to chronic discogenic endplate changes and  moderate (4 mm) broad-based disc bulge resulting in mild canal stenosis. Moderate to severe right, moderate left T11-12 neural foraminal narrowing.  The remaining thoracic vertebral bodies and posterior elements are intact and aligned. T8-9 disc height loss, with bridging bone marrow signal suggests auto interbody arthrodesis. Mild discogenic endplate changes of the upper thoracic levels. Scattered chronic Schmorl's nodes.  Thoracic spinal cord deformity at T10-11 due to canal stenosis, with no abnormal cord signal ; conus medullaris terminates at L1. Minimal bright interstitial surgeries STIR signal at T10-11 consistent with grade 1 strain.  Small broad-based disc bulge at T7-8 results in mild canal stenosis.  Small bilateral pleural effusions.  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 Moderate canal stenosis at T10-11 with severe neural foraminal narrowing.  Abnormal signal within T11 disc concerning for early discitis without osteomyelitis. Degenerative change at this level resulting in mild canal stenosis  and moderate to severe right, moderate left neural foraminal narrowing.  Grade 1 paraspinal muscle strain T10-11.  Electronically Signed: By: Elon Alas On: 11/20/2013 19:06    Assessment/Plan:  80yo F with DM, HTN, CKD 4 who presents with worsening back pain and recent falls found to have MRI findings concerning for T10-11 disckitis/osteo  - Would get biopsy/aspirate for tissue to send for  aerobic culture as well as pathology - sed rate not elevated significantly, may reflect chronic osteo - no need for antibiotics at this time, await to start them after specimen collection. Can treat with vancomycin and ceftriaxone 2gm IV daily empirically, then will narrow once cultures return - will likely need 4-6 wks of IV antibiotics.  Elzie Rings [facility] [phone]

## 2013-11-21 NOTE — Progress Notes (Signed)
IR consult for disc aspiration. Chart and imaging reviewed with Radiologists, Dr. Estanislado Pandy and Dr. Maree Erie. Appropriate for disc aspiration and should yield a reasonable sample. However, pt received Lovenox today. Pt is also on IV Vanc and Zosyn per med rec, which may affect culture results. IR can perform disc aspiration tomorrow am. Have made pt NPO p MN for procedure.  Ascencion Dike PA-C Interventional Radiology 11/21/2013 12:30 PM

## 2013-11-21 NOTE — Progress Notes (Signed)
ANTIBIOTIC CONSULT NOTE - FOLLOW UP  Pharmacy Consult for Vanco/Zosyn Indication: GPC Bacteremia/  Ostemyelitis  Allergies  Allergen Reactions  . Motrin [Ibuprofen] Hives    Patient Measurements: Height: 5\' 2"  (157.5 cm) Weight: 228 lb 6.3 oz (103.6 kg) IBW/kg (Calculated) : 50.1 Adjusted Body Weight:    Vital Signs: Temp: 97.6 F (36.4 C) (01/03 1339) Temp src: Oral (01/03 1339) BP: 152/53 mmHg (01/03 1552) Pulse Rate: 86 (01/03 1339) Intake/Output from previous day: 01/02 0701 - 01/03 0700 In: 917.5 [I.V.:867.5; IV Piggyback:50] Out: -  Intake/Output from this shift:    Labs:  Recent Labs  11/20/13 0130 11/20/13 0530 11/21/13 0500  WBC 8.7 9.0 8.3  HGB 12.1 11.4* 10.8*  PLT 202 171 177  CREATININE 1.85* 1.77* 1.96*   Estimated Creatinine Clearance: 25.4 ml/min (by C-G formula based on Cr of 1.96).  Recent Labs  11/21/13 0840  VANCORANDOM <5.0     Microbiology: Recent Results (from the past 720 hour(s))  CULTURE, BLOOD (ROUTINE X 2)     Status: None   Collection Time    11/20/13  7:35 AM      Result Value Range Status   Specimen Description BLOOD LEFT ARM   Final   Special Requests BOTTLES DRAWN AEROBIC AND ANAEROBIC 5CC   Final   Culture  Setup Time     Final   Value: 11/20/2013 12:52     Performed at [facility]   Culture     Final   Value:        BLOOD CULTURE RECEIVED NO GROWTH TO DATE CULTURE WILL BE HELD FOR 5 DAYS BEFORE ISSUING A FINAL NEGATIVE REPORT     Performed at [facility]   Report Status PENDING   Incomplete  CULTURE, BLOOD (ROUTINE X 2)     Status: None   Collection Time    11/20/13  7:50 AM      Result Value Range Status   Specimen Description BLOOD RIGHT ARM   Final   Special Requests BOTTLES DRAWN AEROBIC AND ANAEROBIC 5CC   Final   Culture  Setup Time     Final   Value: 11/20/2013 12:51     Performed at [facility]   Culture     Final   Value: GRAM POSITIVE COCCI IN CLUSTERS     Note: Gram  Stain Report Called to,Read Back By and Verified With: KELLY CORUM 11/21/13 @ 1:27PM BY RUSCOE A.     Performed at [facility]   Report Status PENDING   Incomplete  URINE CULTURE     Status: None   Collection Time    11/20/13 10:07 AM      Result Value Range Status   Specimen Description URINE, CLEAN CATCH   Final   Special Requests Normal   Final   Culture  Setup Time     Final   Value: 11/20/2013 15:11     Performed at [facility]     Final   Value: 30,000 COLONIES/ML     Performed at [facility]   Culture     Final   Value: Multiple bacterial morphotypes present, none predominant. Suggest appropriate recollection if clinically indicated.     Performed at [facility]   Report Status 11/21/2013 FINAL   Final    Anti-infectives   Start     Dose/Rate Route Frequency Ordered Stop   11/23/13 1000  vancomycin (VANCOCIN) 1,500 mg  in sodium chloride 0.9 % 500 mL IVPB  Status:  Discontinued     1,500 mg 250 mL/hr over 120 Minutes Intravenous Every 48 hours 11/21/13 0950 11/21/13 1045   11/22/13 0600  vancomycin (VANCOCIN) IVPB 1000 mg/200 mL premix  Status:  Discontinued     1,000 mg 200 mL/hr over 60 Minutes Intravenous Every 48 hours 11/20/13 0455 11/21/13 0950   11/21/13 1000  vancomycin (VANCOCIN) 2,000 mg in sodium chloride 0.9 % 500 mL IVPB     2,000 mg 250 mL/hr over 120 Minutes Intravenous STAT 11/21/13 0950 11/21/13 1235   11/20/13 0600  vancomycin (VANCOCIN) 1,500 mg in sodium chloride 0.9 % 500 mL IVPB  Status:  Discontinued     1,500 mg 250 mL/hr over 120 Minutes Intravenous  Once 11/20/13 0455 11/21/13 0949   11/20/13 0530  piperacillin-tazobactam (ZOSYN) IVPB 3.375 g  Status:  Discontinued     3.375 g 12.5 mL/hr over 240 Minutes Intravenous 3 times per day 11/20/13 0455 11/21/13 1045      Assessment: Admit Complaint: Back pain s/p fall.  Infectious Disease: Vancomycin 2 g IV dose (1st dose) dose hung at 10:35 then  held at 11:05 due to orders from physician to hold all antibiotics since patient was supposed to have fluid drained & cultured.  The vancomycin and zosyn orders were discontinued by the MD.  Now vancomycin pharmacy consult has been reordered to restart vancomycin IV for GPC bacteremia.  RN reports MD said to hold the Zosyn since still needs to have fluid drained/cultured.   Dose of Vancomycin was not charted as given 1/2. Random Vancomycin level =<5 indicating that Vancomycin was not given yesterday.  1/2 Vanco>>1/3 : restart 1/3>> 1/2 Zosyn>>DC'd 1/3   Goal of Therapy:  Vancomycin trough level 15-20 mcg/ml  Plan:  Finish infusing the Vancomycin 2000 mg IV dose now, then 1500 g IV q48h.  Follow up if Zosyn to be resumed  [facility] pharmacy consult has not been discontinued but MD DC'd the Zosyn medication today).  Follow up renal function, clinical status, culture results and vancomycin steady state trough.   Nicole Cella, RPh Clinical Pharmacist Pager: [phone] 11/21/2013,4:22 PM

## 2013-11-21 NOTE — Progress Notes (Signed)
Patient was lower thoracic pain without significant radiation or weakness. MRI scan demonstrates destructive process at the T10/T11 level consistent with a chronic-appearing vertebral osteomyelitis/discitis. No evidence of epidural abscess. No evidence of significant spinal stenosis.  I recommend aspiration of the disc space by interventional radiology and placement of patient on broad-spectrum antibiotics secondary to her history of diabetes. Although the most likely organism will be staph in diabetics there is also the possibility of gram-negative organisms as well.

## 2013-11-22 ENCOUNTER — Inpatient Hospital Stay (HOSPITAL_COMMUNITY): Payer: Medicare HMO

## 2013-11-22 ENCOUNTER — Encounter (HOSPITAL_COMMUNITY): Payer: Self-pay | Admitting: Radiology

## 2013-11-22 DIAGNOSIS — R82998 Other abnormal findings in urine: Secondary | ICD-10-CM

## 2013-11-22 LAB — GLUCOSE, CAPILLARY
GLUCOSE-CAPILLARY: 96 mg/dL (ref 70–99)
GLUCOSE-CAPILLARY: 137 mg/dL — AB (ref 70–99)
Glucose-Capillary: 110 mg/dL — ABNORMAL HIGH (ref 70–99)
Glucose-Capillary: 114 mg/dL — ABNORMAL HIGH (ref 70–99)

## 2013-11-22 LAB — CULTURE, BLOOD (ROUTINE X 2)

## 2013-11-22 LAB — CBC
HCT: 34.1 % — ABNORMAL LOW (ref 36.0–46.0)
Hemoglobin: 10.7 g/dL — ABNORMAL LOW (ref 12.0–15.0)
MCH: 27.3 pg (ref 26.0–34.0)
MCHC: 31.4 g/dL (ref 30.0–36.0)
MCV: 87 fL (ref 78.0–100.0)
PLATELETS: 171 10*3/uL (ref 150–400)
RBC: 3.92 MIL/uL (ref 3.87–5.11)
RDW: 15.2 % (ref 11.5–15.5)
WBC: 8.1 10*3/uL (ref 4.0–10.5)

## 2013-11-22 LAB — BASIC METABOLIC PANEL
BUN: 29 mg/dL — ABNORMAL HIGH (ref 6–23)
CALCIUM: 9.9 mg/dL (ref 8.4–10.5)
CO2: 25 mEq/L (ref 19–32)
Chloride: 108 mEq/L (ref 96–112)
Creatinine, Ser: 1.93 mg/dL — ABNORMAL HIGH (ref 0.50–1.10)
GFR calc non Af Amer: 23 mL/min — ABNORMAL LOW (ref >90)
GFR, EST AFRICAN AMERICAN: 27 mL/min — AB (ref >90)
Glucose, Bld: 111 mg/dL — ABNORMAL HIGH (ref 70–99)
Potassium: 4 mEq/L (ref 3.7–5.3)
SODIUM: 144 meq/L (ref 137–147)

## 2013-11-22 LAB — HEMOGLOBIN A1C
HEMOGLOBIN A1C: 6.8 % — AB (ref <5.7)
MEAN PLASMA GLUCOSE: 148 mg/dL — AB (ref <117)

## 2013-11-22 MED ORDER — MIDAZOLAM HCL 2 MG/2ML IJ SOLN
INTRAMUSCULAR | Status: AC | PRN
  Administered 2013-11-22 (×2): 1 mg via INTRAVENOUS

## 2013-11-22 MED ORDER — SODIUM CHLORIDE 0.9 % IV SOLN: INTRAVENOUS | Status: AC

## 2013-11-22 MED ORDER — FENTANYL CITRATE 0.05 MG/ML IJ SOLN
INTRAMUSCULAR | Status: AC
  Filled 2013-11-22: qty 2

## 2013-11-22 MED ORDER — MIDAZOLAM HCL 2 MG/2ML IJ SOLN
INTRAMUSCULAR | Status: AC
  Filled 2013-11-22: qty 2

## 2013-11-22 MED ORDER — DEXTROSE 5 % IV SOLN
2.0000 g | INTRAVENOUS | Status: DC
  Administered 2013-11-22 – 2013-11-26 (×5): 2 g via INTRAVENOUS
  Filled 2013-11-22 (×6): qty 2

## 2013-11-22 MED ORDER — FENTANYL CITRATE 0.05 MG/ML IJ SOLN
INTRAMUSCULAR | Status: AC | PRN
  Administered 2013-11-22 (×2): 25 ug via INTRAVENOUS

## 2013-11-22 NOTE — Consult Note (Signed)
HPI: [name] [name] is an 80 y.o. female with back pain, admitted and found to have evidence of discitis/osteomyelitis on MRI at the T10 and T11 levels. She has been started on abx and IR is asked to perform disc aspiration in hopes for accurate culture. Chart, PMHx, current meds reviewed. Pt received Lovenox yesterday morning, but none since. Not on any chronic anticoagulation.  Past Medical History:  Past Medical History  Diagnosis Date  . Hypertension   . Gout   . Hypercholesteremia   . Diabetes mellitus     Past Surgical History:  Past Surgical History  Procedure Laterality Date  . Cesarean section    . Rotator cuff repair    . Tonsillectomy    . Tubal ligation      Family History:  Family History  Problem Relation Age of Onset  . Hypertension Mother   . Hypertension Father   . Hypertension Daughter     Social History:  reports that she has quit smoking. She does not have any smokeless tobacco history on file. She reports that she does not drink alcohol or use illicit drugs.  Allergies:  Allergies  Allergen Reactions  . Motrin [Ibuprofen] Hives    Medications: Current facility-administered medications:0.9 %  sodium chloride infusion, , Intravenous, Continuous, Theodis Blaze, MD, Last Rate: 50 mL/hr at 11/21/13 1201;  carvedilol (COREG) tablet 12.5 mg, 12.5 mg, Oral, BID WC, Adeline C Viyuoh, MD, 12.5 mg at 11/21/13 1509;  [START ON 11/23/2013] enoxaparin (LOVENOX) injection 30 mg, 30 mg, Subcutaneous, Daily, Adeline C Viyuoh, MD feeding supplement (GLUCERNA SHAKE) (GLUCERNA SHAKE) liquid 237 mL, 237 mL, Oral, BID BM, Baird Lyons, RD, 237 mL at 11/21/13 1509;  hydrALAZINE (APRESOLINE) injection 10 mg, 10 mg, Intravenous, Q4H PRN, Sheila Oats, MD, 10 mg at 11/21/13 1509;  HYDROcodone-acetaminophen (NORCO/VICODIN) 5-325 MG per tablet 1-2 tablet, 1-2 tablet, Oral, Q4H PRN, Theodis Blaze, MD, 2 tablet at 11/22/13 0214 HYDROmorphone (DILAUDID) injection 0.5 mg, 0.5  mg, Intravenous, Q2H PRN, Theodis Blaze, MD;  insulin aspart (novoLOG) injection 0-5 Units, 0-5 Units, Subcutaneous, QHS, Adeline C Viyuoh, MD;  insulin aspart (novoLOG) injection 0-9 Units, 0-9 Units, Subcutaneous, TID WC, Adeline C Viyuoh, MD;  multivitamin with minerals tablet 1 tablet, 1 tablet, Oral, Daily, Baird Lyons, RD, 1 tablet at 11/21/13 1035 ondansetron (ZOFRAN) injection 4 mg, 4 mg, Intravenous, Q6H PRN, Theodis Blaze, MD;  ondansetron [facility]) tablet 4 mg, 4 mg, Oral, Q6H PRN, Theodis Blaze, MD;  sodium chloride 0.9 % injection 10-40 mL, 10-40 mL, Intracatheter, Q12H, Adeline C Viyuoh, MD;  sodium chloride 0.9 % injection 10-40 mL, 10-40 mL, Intracatheter, PRN, Sheila Oats, MD, 10 mL at 11/21/13 1735 [START ON 11/23/2013] vancomycin (VANCOCIN) 1,500 mg in sodium chloride 0.9 % 500 mL IVPB, 1,500 mg, Intravenous, Q48H, Arman Bogus, [facility]  Please HPI for pertinent positives, otherwise complete 10 system ROS negative.  Physical Exam: BP 168/69  Pulse 80  Temp(Src) 98.5 F (36.9 C) (Oral)  Resp 18  Ht _0  (1.575 m)  Wt 228 lb 6.3 oz (103.6 kg)  BMI 41.76 kg/m2  SpO2 97% Body mass index is 41.76 kg/(m^2).   General Appearance:  Alert, cooperative, no distress, appears stated age  Head:  Normocephalic, without obvious abnormality, atraumatic  ENT: Unremarkable  Neck: Supple, symmetrical, trachea midline  Lungs:   Clear to auscultation bilaterally, no w/r/r, respirations unlabored without use of accessory muscles.  Chest Wall:  No tenderness or  deformity  Heart:  Regular rate and rhythm, S1, S2 normal, no murmur, rub or gallop.  Neurologic: Normal affect, no gross deficits.   Results for orders placed during the hospital encounter of 11/19/13 (from the past 48 hour(s))  URINALYSIS, ROUTINE W REFLEX MICROSCOPIC     Status: Abnormal   Collection Time    11/20/13 10:07 AM      Result Value Range   Color, Urine YELLOW  YELLOW   APPearance CLOUDY (*) CLEAR    Specific Gravity, Urine 1.016  1.005 - 1.030   pH 5.0  5.0 - 8.0   Glucose, UA NEGATIVE  NEGATIVE mg/dL   Hgb urine dipstick NEGATIVE  NEGATIVE   Bilirubin Urine NEGATIVE  NEGATIVE   Ketones, ur NEGATIVE  NEGATIVE mg/dL   Protein, ur NEGATIVE  NEGATIVE mg/dL   Urobilinogen, UA 0.2  0.0 - 1.0 mg/dL   Nitrite NEGATIVE  NEGATIVE   Leukocytes, UA MODERATE (*) NEGATIVE  URINE CULTURE     Status: None   Collection Time    11/20/13 10:07 AM      Result Value Range   Specimen Description URINE, CLEAN CATCH     Special Requests Normal     Culture  Setup Time       Value: 11/20/2013 15:11     Performed at [facility]       Value: 30,000 COLONIES/ML     Performed at [facility]   Culture       Value: Multiple bacterial morphotypes present, none predominant. Suggest appropriate recollection if clinically indicated.     Performed at [facility]   Report Status 11/21/2013 FINAL    URINE MICROSCOPIC-ADD ON     Status: Abnormal   Collection Time    11/20/13 10:07 AM      Result Value Range   Squamous Epithelial / LPF FEW (*) RARE   WBC, UA 7-10  <3 WBC/hpf   RBC / HPF 0-2  <3 RBC/hpf   Bacteria, UA MANY (*) RARE  CBC     Status: Abnormal   Collection Time    11/21/13  5:00 AM      Result Value Range   WBC 8.3  4.0 - 10.5 K/uL   RBC 3.96  3.87 - 5.11 MIL/uL   Hemoglobin 10.8 (*) 12.0 - 15.0 g/dL   HCT 34.5 (*) 36.0 - 46.0 %   MCV 87.1  78.0 - 100.0 fL   MCH 27.3  26.0 - 34.0 pg   MCHC 31.3  30.0 - 36.0 g/dL   RDW 15.1  11.5 - 15.5 %   Platelets 177  150 - 400 K/uL  BASIC METABOLIC PANEL     Status: Abnormal   Collection Time    11/21/13  5:00 AM      Result Value Range   Sodium 145  137 - 147 mEq/L   Comment: Please note change in reference range.   Potassium 3.9  3.7 - 5.3 mEq/L   Comment: Please note change in reference range.   Chloride 108  96 - 112 mEq/L   CO2 27  19 - 32 mEq/L   Glucose, Bld 100 (*) 70 - 99 mg/dL   BUN 30 (*) 6  - 23 mg/dL   Creatinine, Ser 1.96 (*) 0.50 - 1.10 mg/dL   Calcium 10.0  8.4 - 10.5 mg/dL   GFR calc non Af Amer 23 (*) >90 mL/min   GFR calc Af Wyvonnia Lora  26 (*) >90 mL/min   Comment: (NOTE)     The eGFR has been calculated using the CKD EPI equation.     This calculation has not been validated in all clinical situations.     eGFR's persistently <90 mL/min signify possible Chronic Kidney     Disease.  HEMOGLOBIN A1C     Status: Abnormal   Collection Time    11/21/13  5:00 AM      Result Value Range   Hemoglobin A1C 6.8 (*) <5.7 %   Comment: (NOTE)                                                                               According to the ADA Clinical Practice Recommendations for 2011, when     HbA1c is used as a screening test:      >=6.5%   Diagnostic of Diabetes Mellitus               (if abnormal result is confirmed)     5.7-6.4%   Increased risk of developing Diabetes Mellitus     References:Diagnosis and Classification of Diabetes Mellitus,Diabetes     TRVU,0233,43(HWYSH 1):S62-S69 and Standards of Medical Care in             Diabetes - 2011,Diabetes Care,2011,34 (Suppl 1):S11-S61.   Mean Plasma Glucose 148 (*) <117 mg/dL   Comment: Performed at [facility], [facility]     Status: None   Collection Time    11/21/13  8:40 AM      Result Value Range   Vancomycin Rm <5.0     Comment:            Random Vancomycin therapeutic     range is dependent on dosage and     time of specimen collection.     A peak range is 20.0-40.0 ug/mL     A trough range is 5.0-15.0 ug/mL             GLUCOSE, CAPILLARY     Status: Abnormal   Collection Time    11/21/13  5:15 PM      Result Value Range   Glucose-Capillary 109 (*) 70 - 99 mg/dL   Comment 1 Documented in Chart     Comment 2 Notify RN    GLUCOSE, CAPILLARY     Status: Abnormal   Collection Time    11/21/13  9:38 PM      Result Value Range   Glucose-Capillary 130 (*) 70 - 99 mg/dL  BASIC METABOLIC PANEL      Status: Abnormal   Collection Time    11/22/13  5:50 AM      Result Value Range   Sodium 144  137 - 147 mEq/L   Comment: Please note change in reference range.   Potassium 4.0  3.7 - 5.3 mEq/L   Comment: Please note change in reference range.   Chloride 108  96 - 112 mEq/L   CO2 25  19 - 32 mEq/L   Glucose, Bld 111 (*) 70 - 99 mg/dL   BUN 29 (*) 6 - 23 mg/dL   Creatinine, Ser 1.93 (*) 0.50 - 1.10 mg/dL  Calcium 9.9  8.4 - 10.5 mg/dL   GFR calc non Af Amer 23 (*) >90 mL/min   GFR calc Af Amer 27 (*) >90 mL/min   Comment: (NOTE)     The eGFR has been calculated using the CKD EPI equation.     This calculation has not been validated in all clinical situations.     eGFR's persistently <90 mL/min signify possible Chronic Kidney     Disease.  CBC     Status: Abnormal   Collection Time    11/22/13  5:50 AM      Result Value Range   WBC 8.1  4.0 - 10.5 K/uL   RBC 3.92  3.87 - 5.11 MIL/uL   Hemoglobin 10.7 (*) 12.0 - 15.0 g/dL   HCT 34.1 (*) 36.0 - 46.0 %   MCV 87.0  78.0 - 100.0 fL   MCH 27.3  26.0 - 34.0 pg   MCHC 31.4  30.0 - 36.0 g/dL   RDW 15.2  11.5 - 15.5 %   Platelets 171  150 - 400 K/uL  GLUCOSE, CAPILLARY     Status: Abnormal   Collection Time    11/22/13  7:35 AM      Result Value Range   Glucose-Capillary 110 (*) 70 - 99 mg/dL   Mr Lumbar Spine Wo Contrast  11/20/2013   CLINICAL DATA:  Back pain, evaluate for discitis.  EXAM: MRI LUMBAR SPINE WITHOUT CONTRAST  TECHNIQUE: Multiplanar, multisequence MR imaging was performed. No intravenous contrast was administered (low GFR of 30, stage 4 kidney disease).  COMPARISON:  Lumbar spine radiograph November 19, 2013  FINDINGS: Lumbar vertebral bodies and posterior elements are intact and aligned with maintenance of lumbar lordosis. Intervertebral discs demonstrate normal morphology, decreased T2 signal within all disc most consistent mild desiccation. Minimal STIR signal within the L5-S1 facets favoring reactive changes associated  with small effusions. No suspicious STIR signal within the vertebral bodies or discs.  Included view of the thoracic spine demonstrates T11-12 broad-based disc bulge fluid signal within the disc common destructive process of the T11 vertebral body incompletely characterized. Conus medullaris terminates at L1-2 and appears normal in morphology and signal characteristics. Cauda equina is nonsuspicious. Multiple T2 hyperintensities arising from the kidneys may reflect cyst. Multiple small low signal lesions throughout the uterus most consistent with leiomyomata. Partially imaged diverticulosis.  Level by level evaluation:  L1-2: Minimal annular bulging without canal stenosis or neural foraminal narrowing.  L2-3: Small bilateral far lateral disc protrusions. Mild facet arthropathy and ligamentum flavum redundancy. Minimal canal stenosis. Mild bilateral neural foraminal narrowing.  L3-4: 4 mm broad-based disc bulge. Moderate facet arthropathy and ligamentum flavum redundancy with bilateral facet effusions measuring up to 3 mm on the right. Moderate canal stenosis, AP dimension of the thecal sac is 7 mm. Mild to moderate right, mild left neural foraminal narrowing.  L4-5: 2 mm broad-based disc bulge is superimposed small left far lateral disc protrusion and annular tear. Mild to moderate facet arthropathy and ligamentum flavum redundancy with trace facet effusions which are likely reactive. Mild canal stenosis. Mild bilateral neural foraminal narrowing.  L5-S1 4 mm broad-based disc bulge asymmetric to the right may encroach upon the exited right L5 nerve. Moderate to severe facet arthropathy and ligamentum flavum redundancy with trace facet effusions which are likely reactive. No canal stenosis. Severe right, moderate to severe left neural foraminal narrowing.  IMPRESSION: Abnormal destructive changes of the T10-11 disc, please see dedicated MRI of the thoracic spine from  same day, reported separately for further  description. No MR findings of discitis osteomyelitis in the lumbar spine.  Degenerative change of the lumbar spine: Moderate canal stenosis at L3-4, mild at L4-5.  Neural foraminal narrowing L2-3 to L5-S1: Severe on the right at L5-S1.  Mildly widened facets at L3-4, which can be associated with dynamic instability and could be further characterize with flexion and extension radiographs if clinically indicated.  Critical Value/emergent results were called by telephone at the time of interpretation on 11/20/2013 at 7:57 PM to Dr. Kathline Magic, who verbally acknowledged these results.   Electronically Signed   By: Elon Alas   On: 11/20/2013 19:58   Mr Thoracic Spine W Wo Contrast  11/20/2013   ADDENDUM REPORT: 11/20/2013 19:58  ADDENDUM: Critical Value/emergent results were called by telephone at the time of interpretation on 11/20/2013 at 7:57 PM to Dr. Kathline Magic, who verbally acknowledged these results.   Electronically Signed   By: Elon Alas   On: 11/20/2013 19:58   11/20/2013   CLINICAL DATA:  Pain, rule out discitis.  EXAM: MRI THORACIC SPINE WITHOUT AND WITH CONTRAST  TECHNIQUE: Multiplanar and multiecho pulse sequences of the thoracic spine were obtained without and with intravenous contrast.  CONTRAST:  None: Due to low GFR (30) contrast was not administered.  COMPARISON:  Thoracic radiographs November 19, 2013  FINDINGS: Low T1, bright T2 signal intradiscal mass at T10-11 with complete destruction of the T10 body and inferior endplate, T11 body at and superior endplate. Abnormal fluid/phlegmon extends of the prevertebral soft tissues, right pedicle and posterior elements. 4 mm retropulsed disc material and bony fragments, in addition to facet arthropathy results in moderate canal stenosis at T10-11, AP dimension of the canal is 7 mm. Severe T10-11 neural foraminal narrowing. In addition, severe T11-12 disc height loss with fluid signal within the T11-12 disc associated with moderate subacute  to chronic discogenic endplate changes and moderate (4 mm) broad-based disc bulge resulting in mild canal stenosis. Moderate to severe right, moderate left T11-12 neural foraminal narrowing.  The remaining thoracic vertebral bodies and posterior elements are intact and aligned. T8-9 disc height loss, with bridging bone marrow signal suggests auto interbody arthrodesis. Mild discogenic endplate changes of the upper thoracic levels. Scattered chronic Schmorl's nodes.  Thoracic spinal cord deformity at T10-11 due to canal stenosis, with no abnormal cord signal ; conus medullaris terminates at L1. Minimal bright interstitial surgeries STIR signal at T10-11 consistent with grade 1 strain.  Small broad-based disc bulge at T7-8 results in mild canal stenosis.  Small bilateral pleural effusions.  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 Moderate canal stenosis at T10-11 with severe neural foraminal narrowing.  Abnormal signal within T11 disc concerning for early discitis without osteomyelitis. Degenerative change at this level resulting in mild canal stenosis and moderate to severe right, moderate left neural foraminal narrowing.  Grade 1 paraspinal muscle strain T10-11.  Electronically Signed: By: Elon Alas On: 11/20/2013 19:06    Assessment/Plan: Back pain with T10 and T11 discitis/osteomyelitis For Image guided disc aspiration. Discussed procedure, risks, complications, use of sedation Labs reviewed. No anticoagulation past 24hrs. Consent signed in chart  Ascencion Dike PA-C 11/22/2013, 8:19 AM

## 2013-11-22 NOTE — Progress Notes (Signed)
Patient ID: [name] [name], female   DOB: [birth year], 80 y.o.   MRN: [id number] Patient appears to be doing all right this morning still back pain controlled medication no new symptoms in her legs strength appears to be 5 out of 5 awaiting CT guided aspiration of the space.

## 2013-11-22 NOTE — Progress Notes (Signed)
[facility]    Date of Admission:  11/19/2013   Total days of antibiotics 2        Day 2 vanco           ID: [name] [name] is a 80 y.o. female  with DM, HTN, CKD 4 who presents with worsening back pain and recent falls found to have MRI findings concerning for T10-11 disckitis/osteo  Active Problems:   Back pain   CKD (chronic kidney disease) stage 4, GFR 15-29 ml/min   HTN (hypertension)   Abnormal urinalysis   Hypokalemia    Subjective: Underwent aspirate this morning. Remains afebrile  Started on vanco yesterday due to gpc on blood cx from 1 of 2 sets, identified as CoNS  Medications:  . carvedilol  12.5 mg Oral BID WC  . [START ON 11/23/2013] enoxaparin (LOVENOX) injection  30 mg Subcutaneous Daily  . feeding supplement (GLUCERNA SHAKE)  237 mL Oral BID BM  . fentaNYL      . insulin aspart  0-5 Units Subcutaneous QHS  . insulin aspart  0-9 Units Subcutaneous TID WC  . midazolam      . multivitamin with minerals  1 tablet Oral Daily  . sodium chloride  10-40 mL Intracatheter Q12H  . [START ON 11/23/2013] vancomycin  1,500 mg Intravenous Q48H    Objective: Vital signs in last 24 hours: Temp:  [97.6 F (36.4 C)-98.8 F (37.1 C)] 98.5 F (36.9 C) (01/04 0509) Pulse Rate:  [80-93] 93 (01/04 1051) Resp:  [11-18] 12 (01/04 1051) BP: (148-222)/(53-98) 197/98 mmHg (01/04 1051) SpO2:  [94 %-98 %] 98 % (01/04 1051)   Constitutional:oriented to person, place, and time. appears well-developed and well-nourished. No distress.  HENT:  Mouth/Throat: Oropharynx is clear and moist. No oropharyngeal exudate.  Cardiovascular: Normal rate, regular rhythm and normal heart sounds. Exam reveals no gallop and no friction rub.  No murmur heard.  Pulmonary/Chest: Effort normal and breath sounds normal. No respiratory distress.  has no wheezes.  Abdominal: Soft. Bowel sounds are normal.  exhibits no distension. There is no tenderness.  Lymphadenopathy:  no  cervical adenopathy.  Neurological:  alert and oriented to person, place, and time.  Skin: Skin is warm and dry. No rash noted. No erythema.    Lab Results  Recent Labs  11/21/13 0500 11/22/13 0550  WBC 8.3 8.1  HGB 10.8* 10.7*  HCT 34.5* 34.1*  NA 145 144  K 3.9 4.0  CL 108 108  CO2 27 25  BUN 30* 29*  CREATININE 1.96* 1.93*   Liver Panel No results found for this basename: PROT, ALBUMIN, AST, ALT, ALKPHOS, BILITOT, BILIDIR, IBILI,  in the last 72 hours Sedimentation Rate  Recent Labs  11/20/13 0130  ESRSEDRATE 27*   C-Reactive Protein No results found for this basename: CRP,  in the last 72 hours  Microbiology: 1/2 blood cx 1 of 2 CoNs 1/4 aspirate: pending Studies/Results: Mr Lumbar Spine Wo Contrast  11/20/2013   CLINICAL DATA:  Back pain, evaluate for discitis.  EXAM: MRI LUMBAR SPINE WITHOUT CONTRAST  TECHNIQUE: Multiplanar, multisequence MR imaging was performed. No intravenous contrast was administered (low GFR of 30, stage 4 kidney disease).  COMPARISON:  Lumbar spine radiograph November 19, 2013  FINDINGS: Lumbar vertebral bodies and posterior elements are intact and aligned with maintenance of lumbar lordosis. Intervertebral discs demonstrate normal morphology, decreased T2 signal within all disc most consistent mild desiccation. Minimal STIR signal within the L5-S1 facets  favoring reactive changes associated with small effusions. No suspicious STIR signal within the vertebral bodies or discs.  Included view of the thoracic spine demonstrates T11-12 broad-based disc bulge fluid signal within the disc common destructive process of the T11 vertebral body incompletely characterized. Conus medullaris terminates at L1-2 and appears normal in morphology and signal characteristics. Cauda equina is nonsuspicious. Multiple T2 hyperintensities arising from the kidneys may reflect cyst. Multiple small low signal lesions throughout the uterus most consistent with leiomyomata.  Partially imaged diverticulosis.  Level by level evaluation:  L1-2: Minimal annular bulging without canal stenosis or neural foraminal narrowing.  L2-3: Small bilateral far lateral disc protrusions. Mild facet arthropathy and ligamentum flavum redundancy. Minimal canal stenosis. Mild bilateral neural foraminal narrowing.  L3-4: 4 mm broad-based disc bulge. Moderate facet arthropathy and ligamentum flavum redundancy with bilateral facet effusions measuring up to 3 mm on the right. Moderate canal stenosis, AP dimension of the thecal sac is 7 mm. Mild to moderate right, mild left neural foraminal narrowing.  L4-5: 2 mm broad-based disc bulge is superimposed small left far lateral disc protrusion and annular tear. Mild to moderate facet arthropathy and ligamentum flavum redundancy with trace facet effusions which are likely reactive. Mild canal stenosis. Mild bilateral neural foraminal narrowing.  L5-S1 4 mm broad-based disc bulge asymmetric to the right may encroach upon the exited right L5 nerve. Moderate to severe facet arthropathy and ligamentum flavum redundancy with trace facet effusions which are likely reactive. No canal stenosis. Severe right, moderate to severe left neural foraminal narrowing.  IMPRESSION: Abnormal destructive changes of the T10-11 disc, please see dedicated MRI of the thoracic spine from same day, reported separately for further description. No MR findings of discitis osteomyelitis in the lumbar spine.  Degenerative change of the lumbar spine: Moderate canal stenosis at L3-4, mild at L4-5.  Neural foraminal narrowing L2-3 to L5-S1: Severe on the right at L5-S1.  Mildly widened facets at L3-4, which can be associated with dynamic instability and could be further characterize with flexion and extension radiographs if clinically indicated.  Critical Value/emergent results were called by telephone at the time of interpretation on 11/20/2013 at 7:57 PM to Dr. Kathline Magic, who verbally acknowledged  these results.   Electronically Signed   By: Elon Alas   On: 11/20/2013 19:58   Mr Thoracic Spine W Wo Contrast  11/20/2013   ADDENDUM REPORT: 11/20/2013 19:58  ADDENDUM: Critical Value/emergent results were called by telephone at the time of interpretation on 11/20/2013 at 7:57 PM to Dr. Kathline Magic, who verbally acknowledged these results.   Electronically Signed   By: Elon Alas   On: 11/20/2013 19:58   11/20/2013   CLINICAL DATA:  Pain, rule out discitis.  EXAM: MRI THORACIC SPINE WITHOUT AND WITH CONTRAST  TECHNIQUE: Multiplanar and multiecho pulse sequences of the thoracic spine were obtained without and with intravenous contrast.  CONTRAST:  None: Due to low GFR (30) contrast was not administered.  COMPARISON:  Thoracic radiographs November 19, 2013  FINDINGS: Low T1, bright T2 signal intradiscal mass at T10-11 with complete destruction of the T10 body and inferior endplate, T11 body at and superior endplate. Abnormal fluid/phlegmon extends of the prevertebral soft tissues, right pedicle and posterior elements. 4 mm retropulsed disc material and bony fragments, in addition to facet arthropathy results in moderate canal stenosis at T10-11, AP dimension of the canal is 7 mm. Severe T10-11 neural foraminal narrowing. In addition, severe T11-12 disc height loss with fluid signal within  the T11-12 disc associated with moderate subacute to chronic discogenic endplate changes and moderate (4 mm) broad-based disc bulge resulting in mild canal stenosis. Moderate to severe right, moderate left T11-12 neural foraminal narrowing.  The remaining thoracic vertebral bodies and posterior elements are intact and aligned. T8-9 disc height loss, with bridging bone marrow signal suggests auto interbody arthrodesis. Mild discogenic endplate changes of the upper thoracic levels. Scattered chronic Schmorl's nodes.  Thoracic spinal cord deformity at T10-11 due to canal stenosis, with no abnormal cord signal ; conus  medullaris terminates at L1. Minimal bright interstitial surgeries STIR signal at T10-11 consistent with grade 1 strain.  Small broad-based disc bulge at T7-8 results in mild canal stenosis.  Small bilateral pleural effusions.  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 Moderate canal stenosis at T10-11 with severe neural foraminal narrowing.  Abnormal signal within T11 disc concerning for early discitis without osteomyelitis. Degenerative change at this level resulting in mild canal stenosis and moderate to severe right, moderate left neural foraminal narrowing.  Grade 1 paraspinal muscle strain T10-11.  Electronically Signed: By: Elon Alas On: 11/20/2013 19:06     Assessment/Plan: 80 y.o. female  with DM, HTN, CKD 4 who presents with worsening back pain and recent falls found to have MRI findings concerning for T10-11 disckitis/osteo +/- bacteremia  - continue on vancomycin for now, please add ceftriaxone 2gm IV daily - bacteremia = not sure if true pathogen since only 1 of 2 sets positive with CoNS, await to correlate with aspirate cultures  [facility], [facility] Diseases Cell: [phone] Pager: [phone]  11/22/2013, 11:59 AM

## 2013-11-22 NOTE — Progress Notes (Signed)
[facility] Progress Note     [name] [name] [id number] DOB: 1930-09-18 DOA: 11/19/2013 PCP: No PCP Per Patient  Brief narrative: 80 year old female patient with history of hypertension diabetes and gout who presented with back pain after fall that occurred several hours prior to arrival. Patient described the pain as persistent sharp 10 out of 10 in severity worse with movement. The fall was very minimal and occurred when the patient was attempting to sit down in a recliner chair and did not sit far not back into the chair and slid down out of the chair to the floor. According to the family the patient's had 3 other falls at home this year has become progressively weaker. X-ray in the ER was concerning for possible discitis. It is noted that the patient has had no fever or leukocytosis at presentation.  Assessment/Plan: Active Problems:   Back pain/T10-T11 discitis/osteo -MRI of thoracic spine with severe chronic-appearing T10-T11 discitis osteomyelitis was discharged on a T10 and T11 vertebral bodies, extension of the dorsal epidural without discrete abscess. Moderate canal stenosis at T10-11 with severe neural foramina all narrowing-please see full MRI report for details -Patient had been started on empiric antibiotics on 1/1 but following consultation with ID with a discussion of the above MRI results Dr. Baxter Flattery recommended to discontinue antibiotics and have radiology to do disc aspiration to send for cultures -Following the above lab called with preliminary blood cultures showing 1/2 bottles with GPC in clusters, and on followup discussion with Dr. Baxter Flattery she recommended to resume vancomycin - s/p IR guided aspiration this AM -Neurosurgery consulted as recommended per ID for further recommendations   Abnormal urinalysis -Urine cultures only with multiple bacterial morphotypes present, none predominant   CKD (chronic kidney disease) stage 4, GFR 15-29 ml/min -Baseline creatinine  unknown, creatinine 1.93 this a.m. continue to monitor closely on vanc -Continuing to worsen may need further evaluation/renal consulted -Will hold Lasix for now.   HTN (hypertension), uncontrolled -Resume Coreg, when necessary nitroglycerin and follow   Hypokalemia -Resolved, follow Bacteremia GPC in clusters -1/2 -Per Dr. Baxter Flattery cont vancomycin at this time.  DVT prophylaxis: Lovenox Code Status: Full Family Communication: Updated patient &Daughter at bedside Disposition Plan/Expected LOS: Remain inpatient until source of back pain clearly delineated   Consultants: Infectious disease eval pending Neurosurgery eval pending  Procedures: None  Antibiotics: Zosyn 1/1 >>>1/3 Vancomycin 1/1 >>>  HPI/Subjective: No acute events overnight. Pt is s/p IR guided aspiration.  Objective: Blood pressure 197/98, pulse 93, temperature 98.5 F (36.9 C), temperature source Oral, resp. rate 12, height 5\' 2"  (1.575 m), weight 103.6 kg (228 lb 6.3 oz), SpO2 98.00%.  Intake/Output Summary (Last 24 hours) at 11/22/13 1134 Last data filed at 11/21/13 1803  Gross per 24 hour  Intake    120 ml  Output      0 ml  Net    120 ml      Exam:  General: alert & oriented x 3 In NAD Cardiovascular: RRR, nl S1 s2 Respiratory: CTAB Abdomen: soft +BS NT/ND, no masses palpable Back: Tenderness in mid back area,decreased range of motion due to pain. Extremities: No cyanosis and no edema    Scheduled Meds:  Scheduled Meds: . carvedilol  12.5 mg Oral BID WC  . [START ON 11/23/2013] enoxaparin (LOVENOX) injection  30 mg Subcutaneous Daily  . feeding supplement (GLUCERNA SHAKE)  237 mL Oral BID BM  . fentaNYL      . insulin aspart  0-5 Units Subcutaneous QHS  .  insulin aspart  0-9 Units Subcutaneous TID WC  . midazolam      . multivitamin with minerals  1 tablet Oral Daily  . sodium chloride  10-40 mL Intracatheter Q12H  . [START ON 11/23/2013] vancomycin  1,500 mg Intravenous Q48H   Continuous  Infusions: . sodium chloride 50 mL/hr at 11/21/13 1201  . sodium chloride       Data Reviewed: Basic Metabolic Panel:  Recent Labs Lab 11/20/13 0130 11/20/13 0530 11/21/13 0500 11/22/13 0550  NA 144 144 145 144  K 3.9 3.6* 3.9 4.0  CL 102 105 108 108  CO2 31 27 27 25   GLUCOSE 125* 108* 100* 111*  BUN 32* 30* 30* 29*  CREATININE 1.85* 1.77* 1.96* 1.93*  CALCIUM 10.8* 10.1 10.0 9.9   Liver Function Tests: No results found for this basename: AST, ALT, ALKPHOS, BILITOT, PROT, ALBUMIN,  in the last 168 hours No results found for this basename: LIPASE, AMYLASE,  in the last 168 hours No results found for this basename: AMMONIA,  in the last 168 hours CBC:  Recent Labs Lab 11/20/13 0130 11/20/13 0530 11/21/13 0500 11/22/13 0550  WBC 8.7 9.0 8.3 8.1  NEUTROABS 4.8  --   --   --   HGB 12.1 11.4* 10.8* 10.7*  HCT 37.3 35.2* 34.5* 34.1*  MCV 86.1 85.9 87.1 87.0  PLT 202 171 177 171   Cardiac Enzymes: No results found for this basename: CKTOTAL, CKMB, CKMBINDEX, TROPONINI,  in the last 168 hours BNP (last 3 results)  Recent Labs  11/20/13 0530  PROBNP 894.4*   CBG:  Recent Labs Lab 11/20/13 0422 11/21/13 1715 11/21/13 2138 11/22/13 0735  GLUCAP 99 109* 130* 110*    Recent Results (from the past 240 hour(s))  CULTURE, BLOOD (ROUTINE X 2)     Status: None   Collection Time    11/20/13  7:35 AM      Result Value Range Status   Specimen Description BLOOD LEFT ARM   Final   Special Requests BOTTLES DRAWN AEROBIC AND ANAEROBIC 5CC   Final   Culture  Setup Time     Final   Value: 11/20/2013 12:52     Performed at [facility]   Culture     Final   Value:        BLOOD CULTURE RECEIVED NO GROWTH TO DATE CULTURE WILL BE HELD FOR 5 DAYS BEFORE ISSUING A FINAL NEGATIVE REPORT     Performed at [facility]   Report Status PENDING   Incomplete  CULTURE, BLOOD (ROUTINE X 2)     Status: None   Collection Time    11/20/13  7:50 AM      Result Value  Range Status   Specimen Description BLOOD RIGHT ARM   Final   Special Requests BOTTLES DRAWN AEROBIC AND ANAEROBIC 5CC   Final   Culture  Setup Time     Final   Value: 11/20/2013 12:51     Performed at [facility]   Culture     Final   Value: STAPHYLOCOCCUS SPECIES (COAGULASE NEGATIVE)     Note: THE SIGNIFICANCE OF ISOLATING THIS ORGANISM FROM A SINGLE SET OF BLOOD CULTURES WHEN MULTIPLE SETS ARE DRAWN IS UNCERTAIN. PLEASE NOTIFY THE MICROBIOLOGY DEPARTMENT WITHIN ONE WEEK IF SPECIATION AND SENSITIVITIES ARE REQUIRED.     Note: Gram Stain Report Called to,Read Back By and Verified With: KELLY CORUM 11/21/13 @ 1:27PM BY RUSCOE A.     Performed  at [facility]   Report Status 11/22/2013 FINAL   Final  URINE CULTURE     Status: None   Collection Time    11/20/13 10:07 AM      Result Value Range Status   Specimen Description URINE, CLEAN CATCH   Final   Special Requests Normal   Final   Culture  Setup Time     Final   Value: 11/20/2013 15:11     Performed at [place]     Final   Value: 30,000 COLONIES/ML     Performed at [facility]   Culture     Final   Value: Multiple bacterial morphotypes present, none predominant. Suggest appropriate recollection if clinically indicated.     Performed at [facility]   Report Status 11/21/2013 FINAL   Final     Studies:    Marylu Lund, MD [facility] Office  [phone]  **If unable to reach the above provider after paging please contact the Flow Manager @ [phone]  On-Call/Text Page:      [email or web address]      password [email or web address]  If 7PM-7AM, please contact night-coverage [email or web address] Password [email or web address] 11/22/2013, 11:34 AM   LOS: 3 days

## 2013-11-22 NOTE — Procedures (Signed)
S/P T10 T 11  Region disc aspiration using a 20G Franseen needle . x2 passes made. Approx 5cc of turbid blood stained fluid.obtained.

## 2013-11-23 DIAGNOSIS — R7881 Bacteremia: Secondary | ICD-10-CM

## 2013-11-23 LAB — GLUCOSE, CAPILLARY
GLUCOSE-CAPILLARY: 126 mg/dL — AB (ref 70–99)
Glucose-Capillary: 109 mg/dL — ABNORMAL HIGH (ref 70–99)
Glucose-Capillary: 146 mg/dL — ABNORMAL HIGH (ref 70–99)
Glucose-Capillary: 125 mg/dL — ABNORMAL HIGH (ref 70–99)

## 2013-11-23 MED ORDER — SIMVASTATIN 20 MG PO TABS
20.0000 mg | ORAL_TABLET | Freq: Every day | ORAL | Status: DC
  Administered 2013-11-23 – 2013-11-25 (×3): 20 mg via ORAL
  Filled 2013-11-23 (×4): qty 1

## 2013-11-23 MED ORDER — CLOBETASOL PROPIONATE 0.05 % EX CREA
1.0000 "application " | TOPICAL_CREAM | Freq: Two times a day (BID) | CUTANEOUS | Status: DC
  Administered 2013-11-23 – 2013-11-26 (×7): 1 via TOPICAL
  Filled 2013-11-23 (×2): qty 15

## 2013-11-23 MED ORDER — HYDRALAZINE HCL 25 MG PO TABS
25.0000 mg | ORAL_TABLET | Freq: Two times a day (BID) | ORAL | Status: DC
  Administered 2013-11-23 – 2013-11-24 (×3): 25 mg via ORAL
  Filled 2013-11-23 (×4): qty 1

## 2013-11-23 MED ORDER — VITAMIN C 500 MG PO TABS
500.0000 mg | ORAL_TABLET | Freq: Every day | ORAL | Status: DC
  Administered 2013-11-23 – 2013-11-26 (×4): 500 mg via ORAL
  Filled 2013-11-23 (×4): qty 1

## 2013-11-23 NOTE — Progress Notes (Addendum)
[facility] for Infectious Disease    Date of Admission:  11/19/2013   Total days of antibiotics 3        Day 3 vanco        Day 2 ceftriaxone           ID: [name] [name] is a 80 y.o. female  with DM, HTN, CKD 4 who presents with worsening back pain and recent falls found to have MRI findings concerning for T10-11 disckitis/osteo  Active Problems:   Back pain   CKD (chronic kidney disease) stage 4, GFR 15-29 ml/min   HTN (hypertension)   Abnormal urinalysis   Hypokalemia    Subjective: Remains afebrile. Worked with physical therapy   Medications:  . carvedilol  12.5 mg Oral BID WC  . cefTRIAXone (ROCEPHIN)  IV  2 g Intravenous Q24H  . enoxaparin (LOVENOX) injection  30 mg Subcutaneous Daily  . feeding supplement (GLUCERNA SHAKE)  237 mL Oral BID BM  . insulin aspart  0-5 Units Subcutaneous QHS  . insulin aspart  0-9 Units Subcutaneous TID WC  . multivitamin with minerals  1 tablet Oral Daily  . sodium chloride  10-40 mL Intracatheter Q12H  . vancomycin  1,500 mg Intravenous Q48H    Objective: Vital signs in last 24 hours: Temp:  [98 F (36.7 C)-98.4 F (36.9 C)] 98 F (36.7 C) (01/05 0923) Pulse Rate:  [80-97] 85 (01/05 0923) Resp:  [16-20] 20 (01/05 0923) BP: (163-203)/(54-86) 188/79 mmHg (01/05 1052) SpO2:  [94 %-98 %] 94 % (01/05 0923)   Constitutional:oriented to person, place, and time. appears well-developed and well-nourished. No distress.  HENT:  Mouth/Throat: Oropharynx is clear and moist. No oropharyngeal exudate.  Cardiovascular: Normal rate, regular rhythm and normal heart sounds. Exam reveals no gallop and no friction rub.  No murmur heard.  Pulmonary/Chest: Effort normal and breath sounds normal. No respiratory distress.  has no wheezes.  Abdominal: Soft. Bowel sounds are normal.  exhibits no distension. There is no tenderness.  Lymphadenopathy:  no cervical adenopathy.  Neurological:  alert and oriented to person, place, and time.  Skin:  Skin is warm and dry. No rash noted. No erythema.    Lab Results  Recent Labs  11/21/13 0500 11/22/13 0550  WBC 8.3 8.1  HGB 10.8* 10.7*  HCT 34.5* 34.1*  NA 145 144  K 3.9 4.0  CL 108 108  CO2 27 25  BUN 30* 29*  CREATININE 1.96* 1.93*   Liver Panel No results found for this basename: PROT, ALBUMIN, AST, ALT, ALKPHOS, BILITOT, BILIDIR, IBILI,  in the last 72 hours Sedimentation Rate No results found for this basename: ESRSEDRATE,  in the last 72 hours C-Reactive Protein No results found for this basename: CRP,  in the last 72 hours  Microbiology: 1/2 blood cx 1 of 2 CoNs 1/4 aspirate: pending Studies/Results: No results found.   Assessment/Plan: 80 y.o. female  with DM, HTN, CKD 4 who presents with worsening back pain and recent falls found to have MRI findings concerning for T10-11 disckitis/osteo +/- bacteremia  Presumed diskitis/oseto = will treat with IV vancomycin and ceftriaxone 2gm IV daily x 6 wks if cultures remain negative - bacteremia = not sure if true pathogen since only 1 of 2 sets positive with CoNS, await to correlate with aspirate cultures. Appears to be c/w contaminant. Will repeat blood cultures  [facility], [facility] for Infectious Diseases Cell: [phone] Pager: [phone]  11/23/2013, 11:44 AM

## 2013-11-23 NOTE — Evaluation (Signed)
Occupational Therapy Evaluation Patient Details Name: [name] [name] MRN: [id number] DOB: [birth year] Today's Date: 11/23/2013 Time: 3710-6269 OT Time Calculation (min): 17 min  OT Assessment / Plan / Recommendation History of present illness Patient is an 80 year old female with history of hypertension, hypercholesterolemia, diabetes and gout who presents with back pain after a fall that occurred several hours PTA. Pt describes pain as persistent and sharp, 10/10 in severity, worse with movement and no specific alleviating symptoms. NO specific radiating symptom but it does involve almost entire back area. She was with family trying to get into a recliner chair when she was sitting too close to the edge and slid down from the chair. Per family, pt had three other falls at home this past year and has been progressively weaker. Pt denies chest pain, shortness of breath, no specific abdominal or urinary concerns. TRH asked to admit for further evaluation given findings on XRAY imaging of possible discitis vs osteomyelitis.   Clinical Impression   Pt presents with below problem list. Pt could benefit from acute OT to increase independence prior to d/c. Recommending SNF for additional rehab prior to d/c home.    OT Assessment  Patient needs continued OT Services    Follow Up Recommendations  SNF;Supervision/Assistance - 24 hour    Barriers to Discharge      Equipment Recommendations  Other (comment) (defer to next venue)    Recommendations for Other Services    Frequency  Min 2X/week    Precautions / Restrictions Precautions Precautions: Fall Restrictions Weight Bearing Restrictions: No   Pertinent Vitals/Pain Pain 8/10. Nurse notified that pt was in pain. Repositioned.    ADL  Eating/Feeding: Independent Where Assessed - Eating/Feeding: Chair Grooming: Minimal assistance (due to pain) Where Assessed - Grooming: Supported sitting Upper Body Bathing: Maximal assistance Where Assessed  - Upper Body Bathing: Supported sitting Lower Body Bathing: +1 Total assistance Where Assessed - Lower Body Bathing: Rolling right and/or left;Supine, head of bed flat;Supine, head of bed up Upper Body Dressing: Maximal assistance Where Assessed - Upper Body Dressing: Supported sitting Lower Body Dressing: +1 Total assistance Where Assessed - Lower Body Dressing: Supine, head of bed up;Supine, head of bed flat;Rolling right and/or left Toilet Transfer: +2 Total assistance Toilet Transfer: Patient Percentage: 10% (20%-sit to stand and 10%-squat pivot) Toilet Transfer Method: Sit to stand;Squat pivot Toilet Transfer Equipment: Other (comment) (from bed to recliner chair) Toileting - Clothing Manipulation and Hygiene: Maximal assistance Where Assessed - Toileting Clothing Manipulation and Hygiene: Supine, head of bed flat;Rolling right and/or left Tub/Shower Transfer Method: Not assessed Equipment Used: Gait belt;Reacher;Long-handled sponge;Long-handled shoe horn;Sock aid ADL Comments: Educated on dressing technique as pt has limited AROM in left arm. Educated on use of AE for LB ADLs. Pt performed log rolling in bed for bed pan to be placed under her (no BSC in room).  Pt attempted to perform hygiene, unable to reach well so OT assisted.    OT Diagnosis: Acute pain;Generalized weakness  OT Problem List: Decreased strength;Decreased range of motion;Decreased activity tolerance;Impaired UE functional use;Pain;Decreased knowledge of precautions;Decreased knowledge of use of DME or AE;Impaired balance (sitting and/or standing);Obesity OT Treatment Interventions: Self-care/ADL training;DME and/or AE instruction;Therapeutic activities;Patient/family education;Balance training;Therapeutic exercise   OT Goals(Current goals can be found in the care plan section) Acute Rehab OT Goals Patient Stated Goal: be able to cook OT Goal Formulation: With patient Time For Goal Achievement: 11/30/13 Potential to  Achieve Goals: Fair ADL Goals Pt Will Perform Lower Body Dressing: with  min assist;with adaptive equipment;sit to/from stand Pt Will Transfer to Toilet: with min assist;ambulating;bedside commode Pt Will Perform Toileting - Clothing Manipulation and hygiene: with min assist;sit to/from stand Additional ADL Goal #1: Pt will perform bed mobility at [place] A level as precursor for ADLs.    Visit Information  Last OT Received On: 11/23/13 Assistance Needed: +2 History of Present Illness: Patient is an 80 year old female with history of hypertension, hypercholesterolemia, diabetes and gout who presents with back pain after a fall that occurred several hours PTA. Pt describes pain as persistent and sharp, 10/10 in severity, worse with movement and no specific alleviating symptoms. NO specific radiating symptom but it does involve almost entire back area. She was with family trying to get into a recliner chair when she was sitting too close to the edge and slid down from the chair. Per family, pt had three other falls at home this past year and has been progressively weaker. Pt denies chest pain, shortness of breath, no specific abdominal or urinary concerns. TRH asked to admit for further evaluation given findings on XRAY imaging of possible discitis vs osteomyelitis.       Prior [place] expects to be discharged to:: Private residence Living Arrangements: Children Available Help at Discharge: Available 24 hours/day;Family Type of Home: [place]: One [place]: Environmental consultant - 2 wheels;Cane - single point Prior Function Level of Independence: Needs assistance ADL's / Homemaking Assistance Needed: reports needing assistance with dressing Comments: history of falls Communication Communication: No difficulties         Vision/Perception     Cognition  Cognition Arousal/Alertness: Awake/alert Behavior During Therapy: WFL for tasks  assessed/performed Overall Cognitive Status: Within Functional Limits for tasks assessed    Extremity/Trunk Assessment Upper Extremity Assessment Upper Extremity Assessment: LUE deficits/detail LUE Deficits / Details: limited ROM in LUE Lower Extremity Assessment Lower Extremity Assessment: Defer to PT evaluation     Mobility Bed Mobility Bed Mobility: Rolling Right;Right Sidelying to Sit Rolling Right: 3: Mod assist Right Sidelying to Sit: 1: +2 Total assist Right Sidelying to Sit: Patient Percentage: 20% Details for Bed Mobility Assistance: Cues for technique. Assist to help with trunk to sit on EOB. Transfers Transfers: Sit to Stand;Stand to Sit Sit to Stand: 1: +2 Total assist;With upper extremity assist;From bed;From elevated surface Sit to Stand: Patient Percentage: 20% Stand to Sit: 1: +2 Total assist;To chair/3-in-1 Stand to Sit: Patient Percentage: 20% Details for Transfer Assistance: Cues for technique and hand placement. Pt performed squat pivot from bed to recliner chair at +2 total A (10%).      Exercise     Balance     End of Session OT - End of Session Equipment Utilized During Treatment: Gait belt Activity Tolerance: Patient limited by pain Patient left: in chair;with call bell/phone within reach Nurse Communication: Mobility status;Need for lift equipment  GO     Benito Mccreedy OTR/L [phone] 11/23/2013, 5:27 PM

## 2013-11-23 NOTE — Progress Notes (Signed)
Patient ID: [name] [name]  female  [id number]    DOB: [birth year]    DOA: 11/19/2013  PCP: No PCP Per Patient  Assessment/Plan: Active Problems: Back pain/T10-T11 discitis/osteo +/- bacteremia -MRI of thoracic spine with severe chronic-appearing T10-T11 discitis osteomyelitis was discharged on a T10 and T11 vertebral bodies, extension of the dorsal epidural without discrete abscess. Moderate canal stenosis at T10-11 with severe neural foramina all narrowing - started on empiric antibiotics on 1/1 but following consultation with ID with a discussion of the above MRI results Dr. Baxter Flattery recommended to discontinue antibiotics and recommended to do disc aspiration to send for cultures  -Following the above lab called with preliminary blood cultures showing 1/2 bottles with GPC in clusters. Patient is currently on IV vancomycin and ceftriaxone, daily for 6 weeks if cultures remain negative. Repeat blood cultures, if negative, we'll place PICC line - s/p IR guided aspiration, Neurosurgery consulted as recommended per ID for further recommendations   Abnormal urinalysis  -Urine cultures only with multiple bacterial morphotypes present, none predominant   CKD (chronic kidney disease) stage 4, GFR 15-29 ml/min  -Baseline creatinine unknown, recheck BMET  -Will hold Lasix for now.   HTN (hypertension), uncontrolled  -Resume Coreg added oral hydralazine, continue PRN nitroglycerin   Hypokalemia  -Resolved, follow   Bacteremia GPC in clusters -1/2  - cont vancomycin at this time.   DVT Prophylaxis: Lovenox   Code Status: full CODE STATUS   Family Communication:  Disposition:To be determined, may need skilled nursing facility for IV antibiotics    Subjective: Still has some back pain although no fevers or chills   Objective: Weight change:   Intake/Output Summary (Last 24 hours) at 11/23/13 1410 Last data filed at 11/23/13 1024  Gross per 24 hour  Intake   1040 ml  Output      0 ml   Net   1040 ml   Blood pressure 188/79, pulse 85, temperature 98 F (36.7 C), temperature source Oral, resp. rate 20, height 5\' 2"  (1.575 m), weight 103.6 kg (228 lb 6.3 oz), SpO2 94.00%.  Physical Exam: General: Alert and awake, oriented x3, not in any acute distress. CVS: S1-S2 clear, no murmur rubs or gallops Chest: clear to auscultation bilaterally, no wheezing, rales or rhonchi Abdomen: soft nontender, nondistended, normal bowel sounds  Extremities: no cyanosis, clubbing or edema noted bilaterally Neuro : No focal neurological deficits noted   Lab Results: Basic Metabolic Panel:  Recent Labs Lab 11/21/13 0500 11/22/13 0550  NA 145 144  K 3.9 4.0  CL 108 108  CO2 27 25  GLUCOSE 100* 111*  BUN 30* 29*  CREATININE 1.96* 1.93*  CALCIUM 10.0 9.9   Liver Function Tests: No results found for this basename: AST, ALT, ALKPHOS, BILITOT, PROT, ALBUMIN,  in the last 168 hours No results found for this basename: LIPASE, AMYLASE,  in the last 168 hours No results found for this basename: AMMONIA,  in the last 168 hours CBC:  Recent Labs Lab 11/20/13 0130  11/21/13 0500 11/22/13 0550  WBC 8.7  < > 8.3 8.1  NEUTROABS 4.8  --   --   --   HGB 12.1  < > 10.8* 10.7*  HCT 37.3  < > 34.5* 34.1*  MCV 86.1  < > 87.1 87.0  PLT 202  < > 177 171  < > = values in this interval not displayed. Cardiac Enzymes: No results found for this basename: CKTOTAL, CKMB, CKMBINDEX, TROPONINI,  in the last  168 hours BNP: No components found with this basename: POCBNP,  CBG:  Recent Labs Lab 11/22/13 1117 11/22/13 1658 11/22/13 2148 11/23/13 0741 11/23/13 1138  GLUCAP 96 137* 114* 109* 146*     Micro Results: Recent Results (from the past 240 hour(s))  CULTURE, BLOOD (ROUTINE X 2)     Status: None   Collection Time    11/20/13  7:35 AM      Result Value Range Status   Specimen Description BLOOD LEFT ARM   Final   Special Requests BOTTLES DRAWN AEROBIC AND ANAEROBIC 5CC   Final    Culture  Setup Time     Final   Value: 11/20/2013 12:52     Performed at [facility]   Culture     Final   Value:        BLOOD CULTURE RECEIVED NO GROWTH TO DATE CULTURE WILL BE HELD FOR 5 DAYS BEFORE ISSUING A FINAL NEGATIVE REPORT     Performed at [facility]   Report Status PENDING   Incomplete  CULTURE, BLOOD (ROUTINE X 2)     Status: None   Collection Time    11/20/13  7:50 AM      Result Value Range Status   Specimen Description BLOOD RIGHT ARM   Final   Special Requests BOTTLES DRAWN AEROBIC AND ANAEROBIC 5CC   Final   Culture  Setup Time     Final   Value: 11/20/2013 12:51     Performed at [facility]   Culture     Final   Value: STAPHYLOCOCCUS SPECIES (COAGULASE NEGATIVE)     Note: THE SIGNIFICANCE OF ISOLATING THIS ORGANISM FROM A SINGLE SET OF BLOOD CULTURES WHEN MULTIPLE SETS ARE DRAWN IS UNCERTAIN. PLEASE NOTIFY THE MICROBIOLOGY DEPARTMENT WITHIN ONE WEEK IF SPECIATION AND SENSITIVITIES ARE REQUIRED.     Note: Gram Stain Report Called to,Read Back By and Verified With: KELLY CORUM 11/21/13 @ 1:27PM BY RUSCOE A.     Performed at [facility]   Report Status 11/22/2013 FINAL   Final  URINE CULTURE     Status: None   Collection Time    11/20/13 10:07 AM      Result Value Range Status   Specimen Description URINE, CLEAN CATCH   Final   Special Requests Normal   Final   Culture  Setup Time     Final   Value: 11/20/2013 15:11     Performed at [place]     Final   Value: 30,000 COLONIES/ML     Performed at [facility]   Culture     Final   Value: Multiple bacterial morphotypes present, none predominant. Suggest appropriate recollection if clinically indicated.     Performed at [facility]   Report Status 11/21/2013 FINAL   Final  AFB CULTURE WITH SMEAR     Status: None   Collection Time    11/22/13 11:18 AM      Result Value Range Status   Specimen Description ABSCESS BACK   Final   Special  Requests T 10 T 11   Final   ACID FAST SMEAR     Final   Value: NO ACID FAST BACILLI SEEN     Performed at [facility]   Culture     Final   Value: CULTURE WILL BE EXAMINED FOR 6 WEEKS BEFORE ISSUING A FINAL REPORT     Performed at [facility]   Report Status PENDING   Incomplete  ANAEROBIC CULTURE     Status: None   Collection Time    11/22/13 11:18 AM      Result Value Range Status   Specimen Description ABSCESS BACK   Final   Special Requests T 56 T 11   Final   Gram Stain PENDING   Incomplete   Culture     Final   Value: NO ANAEROBES ISOLATED; CULTURE IN PROGRESS FOR 5 DAYS     Performed at [facility]   Report Status PENDING   Incomplete  FUNGUS CULTURE W SMEAR     Status: None   Collection Time    11/22/13 11:18 AM      Result Value Range Status   Specimen Description ABSCESS BACK   Final   Special Requests T 10 T 11   Final   Fungal Smear     Final   Value: NO YEAST OR FUNGAL ELEMENTS SEEN     Performed at [facility]   Culture     Final   Value: CULTURE IN PROGRESS FOR FOUR WEEKS     Performed at [facility]   Report Status PENDING   Incomplete  CULTURE, ROUTINE-ABSCESS     Status: None   Collection Time    11/22/13 11:19 AM      Result Value Range Status   Specimen Description ABSCESS BACK   Final   Special Requests T 24 T 11   Final   Gram Stain PENDING   Incomplete   Culture     Final   Value: NO GROWTH     Performed at [facility]   Report Status PENDING   Incomplete    Studies/Results: Dg Thoracic Spine W/swimmers  11/20/2013   CLINICAL DATA:  History of fall yesterday.  Difficulty moving.  EXAM: THORACIC SPINE - 2 VIEW + SWIMMERS  COMPARISON:  No priors.  FINDINGS: Irregularity of the inferior endplate of G62 and superior endplate of I94 is highly irregular and concerning for discitis/osteomyelitis. There is some compression of both of these vertebral bodies with approximately 50% loss of height at both  levels, and mild acute kyphotic deformity centered at T10-T11. There is mild multilevel degenerative disc disease, including what appears to be complete bony fusion between T8 and T9. Incidentally imaged portions of the thorax demonstrate diffuse interstitial prominence throughout the lungs bilaterally, most pronounced in the periphery of the lungs, concerning for the underlying interstitial lung disease.  IMPRESSION: 1. Highly unusual appearance of T10 and T11, as above, which is concern for potential discitis/osteomyelitis. Given the patient's history of recent fall, these findings could simply be related to acute trauma, however, the marked irregularity of the endplates at this level raises concern for infection. Correlation with MRI of the thoracic spine with and without IV gadolinium is recommended. 2. The appearance of the lung parenchyma suggests an underlying interstitial lung disease. This could be better evaluated with followup nonemergent high-resolution chest CT if clinically appropriate. These results were called by telephone at the time of interpretation on 11/20/2013 at 12:23 AM toPETER DAMMEN, PA, who verbally acknowledged these results.   Electronically Signed   By: Vinnie Langton M.D.   On: 11/20/2013 00:25   Dg Lumbar Spine Complete  11/20/2013   CLINICAL DATA:  History of fall complaining of difficulty moving.  EXAM: LUMBAR SPINE - COMPLETE 4+ VIEW  COMPARISON:  No priors.  FINDINGS: Five views of  the lumbar spine demonstrate no definite acute displaced fracture or compression type fracture in the lumbar spine. Alignment is anatomic. Mild multilevel degenerative disc disease.  Incidentally imaged portions of the lower thoracic spine again demonstrate marked irregularity of T10 and T11 where there is evidence of bony destruction of the inferior endplate of H06 and superior endplate of C37 with widening of the intervening disc space, highly concerning for discitis and osteomyelitis. At both of  these levels there is significant loss of vertebral body height (approximately 50%).  IMPRESSION: 1. The appearance of T10 and T11 remains concerning for potential discitis/osteomyelitis, and correlation with MRI of the thoracic spine with and without IV gadolinium is strongly recommended. 2. No acute radiographic abnormality of the lumbar spine itself.   Electronically Signed   By: Vinnie Langton M.D.   On: 11/20/2013 00:28   Mr Lumbar Spine Wo Contrast  11/20/2013   CLINICAL DATA:  Back pain, evaluate for discitis.  EXAM: MRI LUMBAR SPINE WITHOUT CONTRAST  TECHNIQUE: Multiplanar, multisequence MR imaging was performed. No intravenous contrast was administered (low GFR of 30, stage 4 kidney disease).  COMPARISON:  Lumbar spine radiograph November 19, 2013  FINDINGS: Lumbar vertebral bodies and posterior elements are intact and aligned with maintenance of lumbar lordosis. Intervertebral discs demonstrate normal morphology, decreased T2 signal within all disc most consistent mild desiccation. Minimal STIR signal within the L5-S1 facets favoring reactive changes associated with small effusions. No suspicious STIR signal within the vertebral bodies or discs.  Included view of the thoracic spine demonstrates T11-12 broad-based disc bulge fluid signal within the disc common destructive process of the T11 vertebral body incompletely characterized. Conus medullaris terminates at L1-2 and appears normal in morphology and signal characteristics. Cauda equina is nonsuspicious. Multiple T2 hyperintensities arising from the kidneys may reflect cyst. Multiple small low signal lesions throughout the uterus most consistent with leiomyomata. Partially imaged diverticulosis.  Level by level evaluation:  L1-2: Minimal annular bulging without canal stenosis or neural foraminal narrowing.  L2-3: Small bilateral far lateral disc protrusions. Mild facet arthropathy and ligamentum flavum redundancy. Minimal canal stenosis. Mild  bilateral neural foraminal narrowing.  L3-4: 4 mm broad-based disc bulge. Moderate facet arthropathy and ligamentum flavum redundancy with bilateral facet effusions measuring up to 3 mm on the right. Moderate canal stenosis, AP dimension of the thecal sac is 7 mm. Mild to moderate right, mild left neural foraminal narrowing.  L4-5: 2 mm broad-based disc bulge is superimposed small left far lateral disc protrusion and annular tear. Mild to moderate facet arthropathy and ligamentum flavum redundancy with trace facet effusions which are likely reactive. Mild canal stenosis. Mild bilateral neural foraminal narrowing.  L5-S1 4 mm broad-based disc bulge asymmetric to the right may encroach upon the exited right L5 nerve. Moderate to severe facet arthropathy and ligamentum flavum redundancy with trace facet effusions which are likely reactive. No canal stenosis. Severe right, moderate to severe left neural foraminal narrowing.  IMPRESSION: Abnormal destructive changes of the T10-11 disc, please see dedicated MRI of the thoracic spine from same day, reported separately for further description. No MR findings of discitis osteomyelitis in the lumbar spine.  Degenerative change of the lumbar spine: Moderate canal stenosis at L3-4, mild at L4-5.  Neural foraminal narrowing L2-3 to L5-S1: Severe on the right at L5-S1.  Mildly widened facets at L3-4, which can be associated with dynamic instability and could be further characterize with flexion and extension radiographs if clinically indicated.  Critical Value/emergent results were called  by telephone at the time of interpretation on 11/20/2013 at 7:57 PM to Dr. Kathline Magic, who verbally acknowledged these results.   Electronically Signed   By: Elon Alas   On: 11/20/2013 19:58   Mr Thoracic Spine W Wo Contrast  11/20/2013   ADDENDUM REPORT: 11/20/2013 19:58  ADDENDUM: Critical Value/emergent results were called by telephone at the time of interpretation on 11/20/2013 at  7:57 PM to Dr. Kathline Magic, who verbally acknowledged these results.   Electronically Signed   By: Elon Alas   On: 11/20/2013 19:58   11/20/2013   CLINICAL DATA:  Pain, rule out discitis.  EXAM: MRI THORACIC SPINE WITHOUT AND WITH CONTRAST  TECHNIQUE: Multiplanar and multiecho pulse sequences of the thoracic spine were obtained without and with intravenous contrast.  CONTRAST:  None: Due to low GFR (30) contrast was not administered.  COMPARISON:  Thoracic radiographs November 19, 2013  FINDINGS: Low T1, bright T2 signal intradiscal mass at T10-11 with complete destruction of the T10 body and inferior endplate, T11 body at and superior endplate. Abnormal fluid/phlegmon extends of the prevertebral soft tissues, right pedicle and posterior elements. 4 mm retropulsed disc material and bony fragments, in addition to facet arthropathy results in moderate canal stenosis at T10-11, AP dimension of the canal is 7 mm. Severe T10-11 neural foraminal narrowing. In addition, severe T11-12 disc height loss with fluid signal within the T11-12 disc associated with moderate subacute to chronic discogenic endplate changes and moderate (4 mm) broad-based disc bulge resulting in mild canal stenosis. Moderate to severe right, moderate left T11-12 neural foraminal narrowing.  The remaining thoracic vertebral bodies and posterior elements are intact and aligned. T8-9 disc height loss, with bridging bone marrow signal suggests auto interbody arthrodesis. Mild discogenic endplate changes of the upper thoracic levels. Scattered chronic Schmorl's nodes.  Thoracic spinal cord deformity at T10-11 due to canal stenosis, with no abnormal cord signal ; conus medullaris terminates at L1. Minimal bright interstitial surgeries STIR signal at T10-11 consistent with grade 1 strain.  Small broad-based disc bulge at T7-8 results in mild canal stenosis.  Small bilateral pleural effusions.  IMPRESSION: Severe chronic appearing T10-11 discitis  osteomyelitis with destruction of the T10 and T11 vertebral bodies, extension of this dorsal epidural space without discrete abscess. Moderate canal stenosis at T10-11 with severe neural foraminal narrowing.  Abnormal signal within T11 disc concerning for early discitis without osteomyelitis. Degenerative change at this level resulting in mild canal stenosis and moderate to severe right, moderate left neural foraminal narrowing.  Grade 1 paraspinal muscle strain T10-11.  Electronically Signed: By: Elon Alas On: 11/20/2013 19:06    Medications: Scheduled Meds: . carvedilol  12.5 mg Oral BID WC  . cefTRIAXone (ROCEPHIN)  IV  2 g Intravenous Q24H  . enoxaparin (LOVENOX) injection  30 mg Subcutaneous Daily  . feeding supplement (GLUCERNA SHAKE)  237 mL Oral BID BM  . insulin aspart  0-5 Units Subcutaneous QHS  . insulin aspart  0-9 Units Subcutaneous TID WC  . multivitamin with minerals  1 tablet Oral Daily  . sodium chloride  10-40 mL Intracatheter Q12H  . vancomycin  1,500 mg Intravenous Q48H      LOS: 4 days   Merina Behrendt M.D. [facility] 11/23/2013, 2:10 PM Pager: [phone]  If 7PM-7AM, please contact night-coverage [email or web address] Password [email or web address]

## 2013-11-23 NOTE — Evaluation (Signed)
Physical Therapy Evaluation Patient Details Name: [name] [name] MRN: [id number] DOB: [birth year] Today's Date: 11/23/2013 Time: 7408-1448 PT Time Calculation (min): 24 min  PT Assessment / Plan / Recommendation History of Present Illness  Patient is an 80 year old female with history of hypertension, hypercholesterolemia, diabetes and gout who presents with back pain after a fall that occurred several hours PTA. Pt describes pain as persistent and sharp, 10/10 in severity, worse with movement and no specific alleviating symptoms. NO specific radiating symptom but it does involve almost entire back area. She was with family trying to get into a recliner chair when she was sitting too close to the edge and slid down from the chair. Per family, pt had three other falls at home this past year and has been progressively weaker. Pt denies chest pain, shortness of breath, no specific abdominal or urinary concerns. TRH asked to admit for further evaluation given findings on XRAY imaging of possible discitis vs osteomyelitis.  Clinical Impression  Pt presents with decreased mobility, requiring max-total A and will benefit from acute skilled PT services to increase functional independence.  Recommend SNF at d/c due to pt requiring high level of care at this time, pt agreeable.       PT Assessment  Patient needs continued PT services    Follow Up Recommendations  SNF    Does the patient have the potential to tolerate intense rehabilitation      Barriers to Discharge        Equipment Recommendations  None recommended by PT    Recommendations for Other Services     Frequency Min 3X/week    Precautions / Restrictions Restrictions Weight Bearing Restrictions: No   Pertinent Vitals/Pain Pt c/o 10/10 pain in back with sitting/standing, RN made aware, repositioned      Mobility  Bed Mobility Bed Mobility: Rolling Right;Rolling Left;Supine to Sit;Sit to Supine;Scooting to [facility] Rolling Right: 3:  Mod assist Rolling Left: 3: Mod assist Supine to Sit: 2: Max assist Sit to Supine: 2: Max assist Scooting to [facility]: 1: +2 Total assist Details for Bed Mobility Assistance: pt limited by back pain 10/10 during all movement, requries cues to assist and use bedrails to assist with transfers.  pt states she sleeps in a recliner at home because her bed is too high Transfers Transfers: Sit to Stand;Stand to Sit Sit to Stand: 2: Max assist;From elevated surface Stand to Sit: 3: Mod assist Details for Transfer Assistance: pt requires cues for hand placement, standing with trunk completely forward flexed, unable to extend trunk in standing at all due to pain.  pt requires assist for safety to control descent to sit, requires bed being raised in order to stand with +1 assist    Exercises     PT Diagnosis: Difficulty walking;Generalized weakness;Acute pain  PT Problem List: Decreased strength;Decreased mobility;Decreased range of motion;Decreased activity tolerance;Decreased balance;Pain;Decreased knowledge of use of DME PT Treatment Interventions: DME instruction;Therapeutic exercise;Wheelchair mobility training;Gait training;Balance training;Stair training;Neuromuscular re-education;Modalities;Functional mobility training;Therapeutic activities;Patient/family education     PT Goals(Current goals can be found in the care plan section) Acute Rehab PT Goals Patient Stated Goal: stop the pain PT Goal Formulation: With patient Time For Goal Achievement: 12/07/13 Potential to Achieve Goals: Good  Visit Information  Last PT Received On: 11/23/13 Assistance Needed: +2 History of Present Illness: Patient is an 80 year old female with history of hypertension, hypercholesterolemia, diabetes and gout who presents with back pain after a fall that occurred several hours PTA. Pt describes pain as  persistent and sharp, 10/10 in severity, worse with movement and no specific alleviating symptoms. NO specific  radiating symptom but it does involve almost entire back area. She was with family trying to get into a recliner chair when she was sitting too close to the edge and slid down from the chair. Per family, pt had three other falls at home this past year and has been progressively weaker. Pt denies chest pain, shortness of breath, no specific abdominal or urinary concerns. TRH asked to admit for further evaluation given findings on XRAY imaging of possible discitis vs osteomyelitis.       Prior [place] expects to be discharged to:: Private residence Living Arrangements: Children Available Help at Discharge: Available 24 hours/day;Family Type of Home: [place]: One [place]: Environmental consultant - 2 wheels;Cane - single point Prior Function Level of Independence: Independent with assistive device(s) Comments: history of falls Communication Communication: No difficulties    Cognition  Cognition Arousal/Alertness: Awake/alert Behavior During Therapy: WFL for tasks assessed/performed Overall Cognitive Status: Within Functional Limits for tasks assessed    Extremity/Trunk Assessment Lower Extremity Assessment Lower Extremity Assessment: Generalized weakness Cervical / Trunk Assessment Cervical / Trunk Assessment:  (unable to fully extend trunk in standing due to pain)   Balance Static Sitting Balance Static Sitting - Balance Support: Bilateral upper extremity supported Static Sitting - Level of Assistance: 5: Stand by assistance Static Sitting - Comment/# of Minutes: pt requires B UEs for balance to sit edge of bed, requires min A when lifting 1 UE to don/doff gown  End of Session PT - End of Session Equipment Utilized During Treatment: Gait belt Activity Tolerance: Patient limited by pain Patient left: in bed;with family/visitor present;with nursing/sitter in room;with call bell/phone within reach Nurse Communication: Mobility status  GP      [name] [name] 11/23/2013, 10:43 AM

## 2013-11-24 LAB — GLUCOSE, CAPILLARY
GLUCOSE-CAPILLARY: 112 mg/dL — AB (ref 70–99)
GLUCOSE-CAPILLARY: 145 mg/dL — AB (ref 70–99)
Glucose-Capillary: 136 mg/dL — ABNORMAL HIGH (ref 70–99)
Glucose-Capillary: 127 mg/dL — ABNORMAL HIGH (ref 70–99)

## 2013-11-24 MED ORDER — HYDRALAZINE HCL 50 MG PO TABS
50.0000 mg | ORAL_TABLET | Freq: Two times a day (BID) | ORAL | Status: DC
  Administered 2013-11-24 – 2013-11-25 (×2): 50 mg via ORAL
  Filled 2013-11-24 (×3): qty 1

## 2013-11-24 MED ORDER — CARVEDILOL 25 MG PO TABS
25.0000 mg | ORAL_TABLET | Freq: Two times a day (BID) | ORAL | Status: DC
  Administered 2013-11-24 – 2013-11-26 (×3): 25 mg via ORAL
  Filled 2013-11-24 (×6): qty 1

## 2013-11-24 NOTE — Clinical Social Work Psychosocial (Signed)
Clinical Social Work Department BRIEF PSYCHOSOCIAL ASSESSMENT 11/24/2013  Patient:  [name] [name], [name] [name]     Account Number:  [id number]     Admit date:  11/19/2013  Clinical Social Worker:  Donna Christen  Date/Time:  11/24/2013 04:27 PM  Referred by:  Physician  Date Referred:  11/24/2013 Referred for  SNF Placement   Other Referral:   none.   Interview type:  Family Other interview type:   CSW spoke to pt's daughter via phone.    PSYCHOSOCIAL DATA Living Status:  FAMILY Admitted from facility:   Level of care:   Primary support name:  Ralene Gasparyan Primary support relationship to patient:  CHILD, ADULT Degree of support available:   Strong support system.    CURRENT CONCERNS Current Concerns  Post-Acute Placement   Other Concerns:   none.    SOCIAL WORK ASSESSMENT / PLAN CSW spoke to pt's daughter, Juliann Pulse, via phone. Per Juliann Pulse, family is agreeable SNF placement for pt once she is medically stable for discharge from [facility]. Juliann Pulse stated that pt moved to [place], [place] on November 15, 2013 from [place] to live with Juliann Pulse and for Juliann Pulse to help with pt with her "illness." Juliann Pulse informed CSW that pt would be willing to do "whatever she needed to do to get better." CSW to continue to follow and assist with discharge planning needs for pt.   Assessment/plan status:  Psychosocial Support/Ongoing Assessment of Needs Other assessment/ plan:   none.   Information/referral to community resources:   [facility] SNF placement bed offers.    PATIENTS/FAMILYS RESPONSE TO PLAN OF CARE: Pt's daughter, Juliann Pulse, was understanding and agreeable to CSW plan of care.     Pati Gallo, [place] Social Worker [phone]

## 2013-11-24 NOTE — Progress Notes (Signed)
Patient ID: [name] [name]  female  [id number]    DOB: 1930-02-13    DOA: 11/19/2013  PCP: No PCP Per Patient  Assessment/Plan: Active Problems: Back pain/T10-T11 discitis/osteo +/- bacteremia -MRI of thoracic spine with severe chronic-appearing T10-T11 discitis osteomyelitis was discharged on a T10 and T11 vertebral bodies, extension of the dorsal epidural without discrete abscess. Moderate canal stenosis at T10-11 with severe neural foramina all narrowing - started on empiric antibiotics on 1/1 but following consultation with ID with a discussion of the above MRI results Dr. Baxter Flattery recommended to discontinue antibiotics and recommended to do disc aspiration to send for cultures  -Following the above lab called with preliminary blood cultures showing 1/2 bottles with GPC in clusters. Patient is currently on IV vancomycin and ceftriaxone, daily for 6 weeks if cultures remain negative. Repeat blood cultures 1/5 so far negative - s/p IR guided aspiration, Neurosurgery consulted as recommended per ID for further recommendations   Abnormal urinalysis  -Urine cultures only with multiple bacterial morphotypes present, none predominant   CKD (chronic kidney disease) stage 4, GFR 15-29 ml/min  -Baseline creatinine unknown, recheck BMET  -Will hold Lasix for now.   HTN (hypertension), uncontrolled  -Resume Coreg, increase hydralazine to 50mg  BID, continue PRN IV hydralazine  Hypokalemia  -Resolved, follow   Bacteremia GPC in clusters -1/2  - cont vancomycin at this time.   DVT Prophylaxis: Lovenox   Code Status: full CODE STATUS   Family Communication:  Disposition:To be determined, may need skilled nursing facility for IV antibiotics    Subjective: Continues to have back pain although no fevers or chills , no nausea vomiting  Objective: Weight change:   Intake/Output Summary (Last 24 hours) at 11/24/13 1429 Last data filed at 11/24/13 1231  Gross per 24 hour  Intake    840 ml   Output    225 ml  Net    615 ml   Blood pressure 164/91, pulse 85, temperature 98 F (36.7 C), temperature source Oral, resp. rate 19, height 5\' 2"  (1.575 m), weight 103.6 kg (228 lb 6.3 oz), SpO2 100.00%.  Physical Exam: General: Alert and awake, oriented x3, not in any acute distress. CVS: S1-S2 clear, Chest: CTA B.  Abdomen: soft nontender, nondistended, normal bowel sounds  Extremities: no c/c/e bilaterally Neuro : No focal neurological deficits noted   Lab Results: Basic Metabolic Panel:  Recent Labs Lab 11/21/13 0500 11/22/13 0550  NA 145 144  K 3.9 4.0  CL 108 108  CO2 27 25  GLUCOSE 100* 111*  BUN 30* 29*  CREATININE 1.96* 1.93*  CALCIUM 10.0 9.9   Liver Function Tests: No results found for this basename: AST, ALT, ALKPHOS, BILITOT, PROT, ALBUMIN,  in the last 168 hours No results found for this basename: LIPASE, AMYLASE,  in the last 168 hours No results found for this basename: AMMONIA,  in the last 168 hours CBC:  Recent Labs Lab 11/20/13 0130  11/21/13 0500 11/22/13 0550  WBC 8.7  < > 8.3 8.1  NEUTROABS 4.8  --   --   --   HGB 12.1  < > 10.8* 10.7*  HCT 37.3  < > 34.5* 34.1*  MCV 86.1  < > 87.1 87.0  PLT 202  < > 177 171  < > = values in this interval not displayed. Cardiac Enzymes: No results found for this basename: CKTOTAL, CKMB, CKMBINDEX, TROPONINI,  in the last 168 hours BNP: No components found with this basename: POCBNP,  CBG:  Recent Labs Lab 11/23/13 1138 11/23/13 1711 11/23/13 2229 11/24/13 0732 11/24/13 1222  GLUCAP 146* 125* 126* 112* 145*     Micro Results: Recent Results (from the past 240 hour(s))  CULTURE, BLOOD (ROUTINE X 2)     Status: None   Collection Time    11/20/13  7:35 AM      Result Value Range Status   Specimen Description BLOOD LEFT ARM   Final   Special Requests BOTTLES DRAWN AEROBIC AND ANAEROBIC 5CC   Final   Culture  Setup Time     Final   Value: 11/20/2013 12:52     Performed at [facility]   Culture     Final   Value:        BLOOD CULTURE RECEIVED NO GROWTH TO DATE CULTURE WILL BE HELD FOR 5 DAYS BEFORE ISSUING A FINAL NEGATIVE REPORT     Performed at [facility]   Report Status PENDING   Incomplete  CULTURE, BLOOD (ROUTINE X 2)     Status: None   Collection Time    11/20/13  7:50 AM      Result Value Range Status   Specimen Description BLOOD RIGHT ARM   Final   Special Requests BOTTLES DRAWN AEROBIC AND ANAEROBIC 5CC   Final   Culture  Setup Time     Final   Value: 11/20/2013 12:51     Performed at [facility]   Culture     Final   Value: STAPHYLOCOCCUS SPECIES (COAGULASE NEGATIVE)     Note: THE SIGNIFICANCE OF ISOLATING THIS ORGANISM FROM A SINGLE SET OF BLOOD CULTURES WHEN MULTIPLE SETS ARE DRAWN IS UNCERTAIN. PLEASE NOTIFY THE MICROBIOLOGY DEPARTMENT WITHIN ONE WEEK IF SPECIATION AND SENSITIVITIES ARE REQUIRED.     Note: Gram Stain Report Called to,Read Back By and Verified With: KELLY CORUM 11/21/13 @ 1:27PM BY RUSCOE A.     Performed at [facility]   Report Status 11/22/2013 FINAL   Final  URINE CULTURE     Status: None   Collection Time    11/20/13 10:07 AM      Result Value Range Status   Specimen Description URINE, CLEAN CATCH   Final   Special Requests Normal   Final   Culture  Setup Time     Final   Value: 11/20/2013 15:11     Performed at [facility]     Final   Value: 30,000 COLONIES/ML     Performed at [facility]   Culture     Final   Value: Multiple bacterial morphotypes present, none predominant. Suggest appropriate recollection if clinically indicated.     Performed at [facility]   Report Status 11/21/2013 FINAL   Final  AFB CULTURE WITH SMEAR     Status: None   Collection Time    11/22/13 11:18 AM      Result Value Range Status   Specimen Description ABSCESS BACK   Final   Special Requests T 10 T 11   Final   ACID FAST SMEAR     Final   Value: NO ACID FAST BACILLI  SEEN     Performed at [facility]   Culture     Final   Value: CULTURE WILL BE EXAMINED FOR 6 WEEKS BEFORE ISSUING A FINAL REPORT     Performed at [facility]   Report Status PENDING   Incomplete  ANAEROBIC CULTURE  Status: None   Collection Time    11/22/13 11:18 AM      Result Value Range Status   Specimen Description ABSCESS BACK   Final   Special Requests T 10 T 11   Final   Gram Stain     Final   Value: NO WBC SEEN     NO SQUAMOUS EPITHELIAL CELLS SEEN     NO ORGANISMS SEEN     Performed at [facility]   Culture     Final   Value: NO ANAEROBES ISOLATED; CULTURE IN PROGRESS FOR 5 DAYS     Performed at [facility]   Report Status PENDING   Incomplete  FUNGUS CULTURE W SMEAR     Status: None   Collection Time    11/22/13 11:18 AM      Result Value Range Status   Specimen Description ABSCESS BACK   Final   Special Requests T 10 T 11   Final   Fungal Smear     Final   Value: NO YEAST OR FUNGAL ELEMENTS SEEN     Performed at [facility]   Culture     Final   Value: CULTURE IN PROGRESS FOR FOUR WEEKS     Performed at [facility]   Report Status PENDING   Incomplete  CULTURE, ROUTINE-ABSCESS     Status: None   Collection Time    11/22/13 11:19 AM      Result Value Range Status   Specimen Description ABSCESS BACK   Final   Special Requests T 10 T 11   Final   Gram Stain     Final   Value: RARE WBC PRESENT,BOTH PMN AND MONONUCLEAR     NO SQUAMOUS EPITHELIAL CELLS SEEN     NO ORGANISMS SEEN     Performed at [facility]   Culture     Final   Value: NO GROWTH 2 DAYS     Performed at [facility]   Report Status PENDING   Incomplete  CULTURE, BLOOD (ROUTINE X 2)     Status: None   Collection Time    11/23/13  2:55 PM      Result Value Range Status   Specimen Description BLOOD LEFT HAND   Final   Special Requests BOTTLES DRAWN AEROBIC ONLY 10CC   Final   Culture  Setup Time     Final   Value:  11/23/2013 20:12     Performed at [facility]   Culture     Final   Value:        BLOOD CULTURE RECEIVED NO GROWTH TO DATE CULTURE WILL BE HELD FOR 5 DAYS BEFORE ISSUING A FINAL NEGATIVE REPORT     Performed at [facility]   Report Status PENDING   Incomplete  CULTURE, BLOOD (ROUTINE X 2)     Status: None   Collection Time    11/23/13  3:05 PM      Result Value Range Status   Specimen Description BLOOD LEFT HAND   Final   Special Requests BOTTLES DRAWN AEROBIC ONLY 5CC   Final   Culture  Setup Time     Final   Value: 11/23/2013 20:10     Performed at [facility]   Culture     Final   Value:        BLOOD CULTURE RECEIVED NO GROWTH TO DATE CULTURE WILL BE HELD FOR 5 DAYS BEFORE ISSUING  A FINAL NEGATIVE REPORT     Performed at [facility]   Report Status PENDING   Incomplete    Studies/Results: Dg Thoracic Spine W/swimmers  11/20/2013   CLINICAL DATA:  History of fall yesterday.  Difficulty moving.  EXAM: THORACIC SPINE - 2 VIEW + SWIMMERS  COMPARISON:  No priors.  FINDINGS: Irregularity of the inferior endplate of V95 and superior endplate of G38 is highly irregular and concerning for discitis/osteomyelitis. There is some compression of both of these vertebral bodies with approximately 50% loss of height at both levels, and mild acute kyphotic deformity centered at T10-T11. There is mild multilevel degenerative disc disease, including what appears to be complete bony fusion between T8 and T9. Incidentally imaged portions of the thorax demonstrate diffuse interstitial prominence throughout the lungs bilaterally, most pronounced in the periphery of the lungs, concerning for the underlying interstitial lung disease.  IMPRESSION: 1. Highly unusual appearance of T10 and T11, as above, which is concern for potential discitis/osteomyelitis. Given the patient's history of recent fall, these findings could simply be related to acute trauma, however, the marked  irregularity of the endplates at this level raises concern for infection. Correlation with MRI of the thoracic spine with and without IV gadolinium is recommended. 2. The appearance of the lung parenchyma suggests an underlying interstitial lung disease. This could be better evaluated with followup nonemergent high-resolution chest CT if clinically appropriate. These results were called by telephone at the time of interpretation on 11/20/2013 at 12:23 AM toPETER DAMMEN, PA, who verbally acknowledged these results.   Electronically Signed   By: Vinnie Langton M.D.   On: 11/20/2013 00:25   Dg Lumbar Spine Complete  11/20/2013   CLINICAL DATA:  History of fall complaining of difficulty moving.  EXAM: LUMBAR SPINE - COMPLETE 4+ VIEW  COMPARISON:  No priors.  FINDINGS: Five views of the lumbar spine demonstrate no definite acute displaced fracture or compression type fracture in the lumbar spine. Alignment is anatomic. Mild multilevel degenerative disc disease.  Incidentally imaged portions of the lower thoracic spine again demonstrate marked irregularity of T10 and T11 where there is evidence of bony destruction of the inferior endplate of V56 and superior endplate of E33 with widening of the intervening disc space, highly concerning for discitis and osteomyelitis. At both of these levels there is significant loss of vertebral body height (approximately 50%).  IMPRESSION: 1. The appearance of T10 and T11 remains concerning for potential discitis/osteomyelitis, and correlation with MRI of the thoracic spine with and without IV gadolinium is strongly recommended. 2. No acute radiographic abnormality of the lumbar spine itself.   Electronically Signed   By: Vinnie Langton M.D.   On: 11/20/2013 00:28   Mr Lumbar Spine Wo Contrast  11/20/2013   CLINICAL DATA:  Back pain, evaluate for discitis.  EXAM: MRI LUMBAR SPINE WITHOUT CONTRAST  TECHNIQUE: Multiplanar, multisequence MR imaging was performed. No intravenous  contrast was administered (low GFR of 30, stage 4 kidney disease).  COMPARISON:  Lumbar spine radiograph November 19, 2013  FINDINGS: Lumbar vertebral bodies and posterior elements are intact and aligned with maintenance of lumbar lordosis. Intervertebral discs demonstrate normal morphology, decreased T2 signal within all disc most consistent mild desiccation. Minimal STIR signal within the L5-S1 facets favoring reactive changes associated with small effusions. No suspicious STIR signal within the vertebral bodies or discs.  Included view of the thoracic spine demonstrates T11-12 broad-based disc bulge fluid signal within the disc common destructive process of the  T11 vertebral body incompletely characterized. Conus medullaris terminates at L1-2 and appears normal in morphology and signal characteristics. Cauda equina is nonsuspicious. Multiple T2 hyperintensities arising from the kidneys may reflect cyst. Multiple small low signal lesions throughout the uterus most consistent with leiomyomata. Partially imaged diverticulosis.  Level by level evaluation:  L1-2: Minimal annular bulging without canal stenosis or neural foraminal narrowing.  L2-3: Small bilateral far lateral disc protrusions. Mild facet arthropathy and ligamentum flavum redundancy. Minimal canal stenosis. Mild bilateral neural foraminal narrowing.  L3-4: 4 mm broad-based disc bulge. Moderate facet arthropathy and ligamentum flavum redundancy with bilateral facet effusions measuring up to 3 mm on the right. Moderate canal stenosis, AP dimension of the thecal sac is 7 mm. Mild to moderate right, mild left neural foraminal narrowing.  L4-5: 2 mm broad-based disc bulge is superimposed small left far lateral disc protrusion and annular tear. Mild to moderate facet arthropathy and ligamentum flavum redundancy with trace facet effusions which are likely reactive. Mild canal stenosis. Mild bilateral neural foraminal narrowing.  L5-S1 4 mm broad-based disc bulge  asymmetric to the right may encroach upon the exited right L5 nerve. Moderate to severe facet arthropathy and ligamentum flavum redundancy with trace facet effusions which are likely reactive. No canal stenosis. Severe right, moderate to severe left neural foraminal narrowing.  IMPRESSION: Abnormal destructive changes of the T10-11 disc, please see dedicated MRI of the thoracic spine from same day, reported separately for further description. No MR findings of discitis osteomyelitis in the lumbar spine.  Degenerative change of the lumbar spine: Moderate canal stenosis at L3-4, mild at L4-5.  Neural foraminal narrowing L2-3 to L5-S1: Severe on the right at L5-S1.  Mildly widened facets at L3-4, which can be associated with dynamic instability and could be further characterize with flexion and extension radiographs if clinically indicated.  Critical Value/emergent results were called by telephone at the time of interpretation on 11/20/2013 at 7:57 PM to Dr. Kathline Magic, who verbally acknowledged these results.   Electronically Signed   By: Elon Alas   On: 11/20/2013 19:58   Mr Thoracic Spine W Wo Contrast  11/20/2013   ADDENDUM REPORT: 11/20/2013 19:58  ADDENDUM: Critical Value/emergent results were called by telephone at the time of interpretation on 11/20/2013 at 7:57 PM to Dr. Kathline Magic, who verbally acknowledged these results.   Electronically Signed   By: Elon Alas   On: 11/20/2013 19:58   11/20/2013   CLINICAL DATA:  Pain, rule out discitis.  EXAM: MRI THORACIC SPINE WITHOUT AND WITH CONTRAST  TECHNIQUE: Multiplanar and multiecho pulse sequences of the thoracic spine were obtained without and with intravenous contrast.  CONTRAST:  None: Due to low GFR (30) contrast was not administered.  COMPARISON:  Thoracic radiographs November 19, 2013  FINDINGS: Low T1, bright T2 signal intradiscal mass at T10-11 with complete destruction of the T10 body and inferior endplate, T11 body at and superior  endplate. Abnormal fluid/phlegmon extends of the prevertebral soft tissues, right pedicle and posterior elements. 4 mm retropulsed disc material and bony fragments, in addition to facet arthropathy results in moderate canal stenosis at T10-11, AP dimension of the canal is 7 mm. Severe T10-11 neural foraminal narrowing. In addition, severe T11-12 disc height loss with fluid signal within the T11-12 disc associated with moderate subacute to chronic discogenic endplate changes and moderate (4 mm) broad-based disc bulge resulting in mild canal stenosis. Moderate to severe right, moderate left T11-12 neural foraminal narrowing.  The remaining thoracic vertebral  bodies and posterior elements are intact and aligned. T8-9 disc height loss, with bridging bone marrow signal suggests auto interbody arthrodesis. Mild discogenic endplate changes of the upper thoracic levels. Scattered chronic Schmorl's nodes.  Thoracic spinal cord deformity at T10-11 due to canal stenosis, with no abnormal cord signal ; conus medullaris terminates at L1. Minimal bright interstitial surgeries STIR signal at T10-11 consistent with grade 1 strain.  Small broad-based disc bulge at T7-8 results in mild canal stenosis.  Small bilateral pleural effusions.  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 Moderate canal stenosis at T10-11 with severe neural foraminal narrowing.  Abnormal signal within T11 disc concerning for early discitis without osteomyelitis. Degenerative change at this level resulting in mild canal stenosis and moderate to severe right, moderate left neural foraminal narrowing.  Grade 1 paraspinal muscle strain T10-11.  Electronically Signed: By: Elon Alas On: 11/20/2013 19:06    Medications: Scheduled Meds: . carvedilol  12.5 mg Oral BID WC  . cefTRIAXone (ROCEPHIN)  IV  2 g  Intravenous Q24H  . clobetasol cream  1 application Topical BID  . enoxaparin (LOVENOX) injection  30 mg Subcutaneous Daily  . feeding supplement (GLUCERNA SHAKE)  237 mL Oral BID BM  . hydrALAZINE  25 mg Oral BID  . insulin aspart  0-5 Units Subcutaneous QHS  . insulin aspart  0-9 Units Subcutaneous TID WC  . multivitamin with minerals  1 tablet Oral Daily  . simvastatin  20 mg Oral q1800  . sodium chloride  10-40 mL Intracatheter Q12H  . vancomycin  1,500 mg Intravenous Q48H  . vitamin C  500 mg Oral Daily      LOS: 5 days   RAI,RIPUDEEP M.D. [facility] 11/24/2013, 2:29 PM Pager: [phone]  If 7PM-7AM, please contact night-coverage [email or web address] Password [email or web address]

## 2013-11-24 NOTE — Progress Notes (Signed)
ANTIBIOTIC CONSULT NOTE - FOLLOW UP  Pharmacy Consult for Vancomycin Indication: Discitis/Osteomyelitis  Allergies  Allergen Reactions  . Motrin [Ibuprofen] Hives    Patient Measurements: Height: 5\' 2"  (157.5 cm) Weight: 228 lb 6.3 oz (103.6 kg) IBW/kg (Calculated) : 50.1    Vital Signs: Temp: 98.7 F (37.1 C) (01/06 2585) Temp src: Oral (01/06 0633) BP: 162/69 mmHg (01/06 1001) Pulse Rate: 97 (01/06 0901) Intake/Output from previous day: 01/05 0701 - 01/06 0700 In: 1300 [P.O.:360; I.V.:640; IV Piggyback:300] Out: -  Intake/Output from this shift: Total I/O In: 240 [P.O.:240] Out: -   Labs:  Recent Labs  11/22/13 0550  WBC 8.1  HGB 10.7*  PLT 171  CREATININE 1.93*   Estimated Creatinine Clearance: 25.8 ml/min (by C-G formula based on Cr of 1.93). No results found for this basename: VANCOTROUGH, VANCOPEAK, VANCORANDOM, [place], [place], [place], TOBRATROUGH, TOBRAPEAK, TOBRARND, AMIKACINPEAK, AMIKACINTROU, AMIKACIN,  in the last 72 hours   Microbiology: Recent Results (from the past 720 hour(s))  CULTURE, BLOOD (ROUTINE X 2)     Status: None   Collection Time    11/20/13  7:35 AM      Result Value Range Status   Specimen Description BLOOD LEFT ARM   Final   Special Requests BOTTLES DRAWN AEROBIC AND ANAEROBIC 5CC   Final   Culture  Setup Time     Final   Value: 11/20/2013 12:52     Performed at [facility]   Culture     Final   Value:        BLOOD CULTURE RECEIVED NO GROWTH TO DATE CULTURE WILL BE HELD FOR 5 DAYS BEFORE ISSUING A FINAL NEGATIVE REPORT     Performed at [facility]   Report Status PENDING   Incomplete  CULTURE, BLOOD (ROUTINE X 2)     Status: None   Collection Time    11/20/13  7:50 AM      Result Value Range Status   Specimen Description BLOOD RIGHT ARM   Final   Special Requests BOTTLES DRAWN AEROBIC AND ANAEROBIC 5CC   Final   Culture  Setup Time     Final   Value: 11/20/2013 12:51     Performed at [facility]   Culture     Final   Value: STAPHYLOCOCCUS SPECIES (COAGULASE NEGATIVE)     Note: THE SIGNIFICANCE OF ISOLATING THIS ORGANISM FROM A SINGLE SET OF BLOOD CULTURES WHEN MULTIPLE SETS ARE DRAWN IS UNCERTAIN. PLEASE NOTIFY THE MICROBIOLOGY DEPARTMENT WITHIN ONE WEEK IF SPECIATION AND SENSITIVITIES ARE REQUIRED.     Note: Gram Stain Report Called to,Read Back By and Verified With: KELLY CORUM 11/21/13 @ 1:27PM BY RUSCOE A.     Performed at [facility]   Report Status 11/22/2013 FINAL   Final  URINE CULTURE     Status: None   Collection Time    11/20/13 10:07 AM      Result Value Range Status   Specimen Description URINE, CLEAN CATCH   Final   Special Requests Normal   Final   Culture  Setup Time     Final   Value: 11/20/2013 15:11     Performed at [place]     Final   Value: 30,000 COLONIES/ML     Performed at [facility]   Culture     Final   Value: Multiple bacterial morphotypes present, none predominant. Suggest appropriate recollection if clinically indicated.  Performed at [facility]   Report Status 11/21/2013 FINAL   Final  AFB CULTURE WITH SMEAR     Status: None   Collection Time    11/22/13 11:18 AM      Result Value Range Status   Specimen Description ABSCESS BACK   Final   Special Requests T 10 T 11   Final   ACID FAST SMEAR     Final   Value: NO ACID FAST BACILLI SEEN     Performed at [facility]   Culture     Final   Value: CULTURE WILL BE EXAMINED FOR 6 WEEKS BEFORE ISSUING A FINAL REPORT     Performed at [facility]   Report Status PENDING   Incomplete  ANAEROBIC CULTURE     Status: None   Collection Time    11/22/13 11:18 AM      Result Value Range Status   Specimen Description ABSCESS BACK   Final   Special Requests T 10 T 11   Final   Gram Stain     Final   Value: NO WBC SEEN     NO SQUAMOUS EPITHELIAL CELLS SEEN     NO ORGANISMS SEEN     Performed at [facility]    Culture     Final   Value: NO ANAEROBES ISOLATED; CULTURE IN PROGRESS FOR 5 DAYS     Performed at [facility]   Report Status PENDING   Incomplete  FUNGUS CULTURE W SMEAR     Status: None   Collection Time    11/22/13 11:18 AM      Result Value Range Status   Specimen Description ABSCESS BACK   Final   Special Requests T 10 T 11   Final   Fungal Smear     Final   Value: NO YEAST OR FUNGAL ELEMENTS SEEN     Performed at [facility]   Culture     Final   Value: CULTURE IN PROGRESS FOR FOUR WEEKS     Performed at [facility]   Report Status PENDING   Incomplete  CULTURE, ROUTINE-ABSCESS     Status: None   Collection Time    11/22/13 11:19 AM      Result Value Range Status   Specimen Description ABSCESS BACK   Final   Special Requests T 10 T 11   Final   Gram Stain     Final   Value: RARE WBC PRESENT,BOTH PMN AND MONONUCLEAR     NO SQUAMOUS EPITHELIAL CELLS SEEN     NO ORGANISMS SEEN     Performed at [facility]   Culture     Final   Value: NO GROWTH 2 DAYS     Performed at [facility]   Report Status PENDING   Incomplete  CULTURE, BLOOD (ROUTINE X 2)     Status: None   Collection Time    11/23/13  2:55 PM      Result Value Range Status   Specimen Description BLOOD LEFT HAND   Final   Special Requests BOTTLES DRAWN AEROBIC ONLY 10CC   Final   Culture  Setup Time     Final   Value: 11/23/2013 20:12     Performed at [facility]   Culture     Final   Value:        BLOOD CULTURE RECEIVED NO GROWTH TO DATE CULTURE WILL BE HELD FOR  5 DAYS BEFORE ISSUING A FINAL NEGATIVE REPORT     Performed at [facility]   Report Status PENDING   Incomplete  CULTURE, BLOOD (ROUTINE X 2)     Status: None   Collection Time    11/23/13  3:05 PM      Result Value Range Status   Specimen Description BLOOD LEFT HAND   Final   Special Requests BOTTLES DRAWN AEROBIC ONLY 5CC   Final   Culture  Setup Time     Final   Value: 11/23/2013  20:10     Performed at [facility]   Culture     Final   Value:        BLOOD CULTURE RECEIVED NO GROWTH TO DATE CULTURE WILL BE HELD FOR 5 DAYS BEFORE ISSUING A FINAL NEGATIVE REPORT     Performed at [facility]   Report Status PENDING   Incomplete    Anti-infectives   Start     Dose/Rate Route Frequency Ordered Stop   11/23/13 1100  vancomycin (VANCOCIN) 1,500 mg in sodium chloride 0.9 % 500 mL IVPB     1,500 mg 250 mL/hr over 120 Minutes Intravenous Every 48 hours 11/21/13 1634     11/23/13 1000  vancomycin (VANCOCIN) 1,500 mg in sodium chloride 0.9 % 500 mL IVPB  Status:  Discontinued     1,500 mg 250 mL/hr over 120 Minutes Intravenous Every 48 hours 11/21/13 0950 11/21/13 1045   11/22/13 1400  cefTRIAXone (ROCEPHIN) 2 g in dextrose 5 % 50 mL IVPB     2 g 100 mL/hr over 30 Minutes Intravenous Every 24 hours 11/22/13 1214     11/22/13 0600  vancomycin (VANCOCIN) IVPB 1000 mg/200 mL premix  Status:  Discontinued     1,000 mg 200 mL/hr over 60 Minutes Intravenous Every 48 hours 11/20/13 0455 11/21/13 0950   11/21/13 1000  vancomycin (VANCOCIN) 2,000 mg in sodium chloride 0.9 % 500 mL IVPB     2,000 mg 250 mL/hr over 120 Minutes Intravenous STAT 11/21/13 0950 11/21/13 1235   11/20/13 0600  vancomycin (VANCOCIN) 1,500 mg in sodium chloride 0.9 % 500 mL IVPB  Status:  Discontinued     1,500 mg 250 mL/hr over 120 Minutes Intravenous  Once 11/20/13 0455 11/21/13 0949   11/20/13 0530  piperacillin-tazobactam (ZOSYN) IVPB 3.375 g  Status:  Discontinued     3.375 g 12.5 mL/hr over 240 Minutes Intravenous 3 times per day 11/20/13 0455 11/21/13 1045      Assessment: Pt on Vancomycin Day #4, Rocephin Day #3 for osteo/discitis. Appears CONS in 1/2 blood cx may be contaminant - repeat bld cx pending. Afeb. WBC wnl.  Plan for 6 weeks IV Rocephin/Vanc per ID MD. 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  1/3 Vanco>>      1/3 VR <5 (vanc dose 1/2 not given) 1/2 Zosyn>>1/3 1/4 Rocephin>>  1/2 BC x 2>> 1/2 CONS  1/2 UCx: neg 1/4: Back abscess>>ngtd 1/5 Bld x 2>>ngtd  Goal of Therapy:  Vancomycin trough level 15-20 mcg/ml  Plan:  Vancomycin 1500 g IV q48h. Rocephin 2g IV q24h  Noted plan for 6 wks IV abx per ID MD, will need trough at [facility] with 4th dose on 1/9.  Sherlon Handing, PharmD, BCPS Clinical pharmacist, pager [phone] 11/24/2013,11:16 AM

## 2013-11-24 NOTE — Clinical Social Work Note (Addendum)
CSW attempted to call pt's daughter to complete assessment for possible SNF placement at time of discharge from [facility] CSW left a voice message with pt's daughter. CSW to continue to follow and assist with discharge planning needs.  4:31pm CSW spoke to pt's daughter via phone. Assessment completed. Clinical information faxed to [facility] for insurance authorization for SNF placement once pt is medically stable for discharge. CSW to continue to follow and assist with discharge planning needs.  Pati Gallo, [place] Social Worker [phone]

## 2013-11-24 NOTE — Progress Notes (Signed)
    [facility] for Infectious Disease    Date of Admission:  11/19/2013   Total days of antibiotics 5        Day 5 vanco        Day 3 ceftriaxone           ID: [name] [name] is a 80 y.o. female  with DM, HTN, CKD 4 who presents with worsening back pain and recent falls found to have MRI findings concerning for T10-11 disckitis/osteo  Active Problems:   Back pain   CKD (chronic kidney disease) stage 4, GFR 15-29 ml/min   HTN (hypertension)   Abnormal urinalysis   Hypokalemia    Subjective: Remains afebrile. Worked with physical therapy   Medications:  . carvedilol  25 mg Oral BID WC  . cefTRIAXone (ROCEPHIN)  IV  2 g Intravenous Q24H  . clobetasol cream  1 application Topical BID  . enoxaparin (LOVENOX) injection  30 mg Subcutaneous Daily  . feeding supplement (GLUCERNA SHAKE)  237 mL Oral BID BM  . hydrALAZINE  50 mg Oral BID  . insulin aspart  0-5 Units Subcutaneous QHS  . insulin aspart  0-9 Units Subcutaneous TID WC  . multivitamin with minerals  1 tablet Oral Daily  . simvastatin  20 mg Oral q1800  . sodium chloride  10-40 mL Intracatheter Q12H  . vancomycin  1,500 mg Intravenous Q48H  . vitamin C  500 mg Oral Daily    Objective: Vital signs in last 24 hours: Temp:  [98 F (36.7 C)-98.7 F (37.1 C)] 98 F (36.7 C) (01/06 1300) Pulse Rate:  [73-97] 85 (01/06 1300) Resp:  [16-19] 19 (01/06 1300) BP: (160-205)/(60-91) 164/91 mmHg (01/06 1300) SpO2:  [91 %-100 %] 100 % (01/06 1300)   Constitutional:oriented to person, place, and time. appears well-developed and well-nourished. No distress.  HENT:  Mouth/Throat: Oropharynx is clear and moist. No oropharyngeal exudate.  Cardiovascular: Normal rate, regular rhythm and normal heart sounds. Exam reveals no gallop and no friction rub.  No murmur heard.  Pulmonary/Chest: Effort normal and breath sounds normal. No respiratory distress.  has no wheezes.  Abdominal: Soft. Bowel sounds are normal.  exhibits no  distension. There is no tenderness.  Lymphadenopathy:  no cervical adenopathy.  Neurological:  alert and oriented to person, place, and time.  Skin: Skin is warm and dry. No rash noted. No erythema.    Lab Results  Recent Labs  11/22/13 0550  WBC 8.1  HGB 10.7*  HCT 34.1*  NA 144  K 4.0  CL 108  CO2 25  BUN 29*  CREATININE 1.93*   Lab Results  Component Value Date   ESRSEDRATE 27* 11/20/2013   Microbiology: 1/2 blood cx 1 of 2 CoNs 1/4 aspirate: pending  Studies/Results: No results found.  Assessment/Plan: 80 y.o. female  with DM, HTN, CKD 4 who presents with worsening back pain and recent falls found to have MRI findings concerning for T10-11 disckitis/osteo   Presumed diskitis/oseto = will treat with IV vancomycin and ceftriaxone 2gm IV daily x 6 wks   bacteremia = not sure if true pathogen since only 1 of 2 sets positive with CoNS, await to correlate with aspirate cultures. Appears to be c/w contaminant. Will repeat blood cultures  Will establish follow up in [place], [facility] for Infectious Diseases Cell: [phone] Pager: [phone]  11/24/2013, 5:24 PM

## 2013-11-25 ENCOUNTER — Inpatient Hospital Stay (HOSPITAL_COMMUNITY): Payer: Medicare HMO

## 2013-11-25 DIAGNOSIS — Z9181 History of falling: Secondary | ICD-10-CM

## 2013-11-25 LAB — BASIC METABOLIC PANEL
BUN: 20 mg/dL (ref 6–23)
CO2: 21 mEq/L (ref 19–32)
CREATININE: 1.48 mg/dL — AB (ref 0.50–1.10)
Calcium: 10.2 mg/dL (ref 8.4–10.5)
Chloride: 106 mEq/L (ref 96–112)
GFR, EST AFRICAN AMERICAN: 37 mL/min — AB (ref >90)
GFR, EST NON AFRICAN AMERICAN: 32 mL/min — AB (ref >90)
Glucose, Bld: 125 mg/dL — ABNORMAL HIGH (ref 70–99)
POTASSIUM: 4.1 meq/L (ref 3.7–5.3)
Sodium: 140 mEq/L (ref 137–147)

## 2013-11-25 LAB — PRO B NATRIURETIC PEPTIDE: Pro B Natriuretic peptide (BNP): 1142 pg/mL — ABNORMAL HIGH (ref 0–450)

## 2013-11-25 LAB — CBC
HCT: 34.1 % — ABNORMAL LOW (ref 36.0–46.0)
Hemoglobin: 10.8 g/dL — ABNORMAL LOW (ref 12.0–15.0)
MCH: 27.3 pg (ref 26.0–34.0)
MCHC: 31.7 g/dL (ref 30.0–36.0)
MCV: 86.3 fL (ref 78.0–100.0)
Platelets: 178 10*3/uL (ref 150–400)
RBC: 3.95 MIL/uL (ref 3.87–5.11)
RDW: 15.8 % — AB (ref 11.5–15.5)
WBC: 7.6 10*3/uL (ref 4.0–10.5)

## 2013-11-25 LAB — CULTURE, ROUTINE-ABSCESS: CULTURE: NO GROWTH

## 2013-11-25 LAB — GLUCOSE, CAPILLARY
GLUCOSE-CAPILLARY: 133 mg/dL — AB (ref 70–99)
Glucose-Capillary: 139 mg/dL — ABNORMAL HIGH (ref 70–99)
Glucose-Capillary: 129 mg/dL — ABNORMAL HIGH (ref 70–99)

## 2013-11-25 MED ORDER — FUROSEMIDE 10 MG/ML IJ SOLN
40.0000 mg | Freq: Once | INTRAMUSCULAR | Status: AC
  Administered 2013-11-25: 40 mg via INTRAVENOUS
  Filled 2013-11-25: qty 4

## 2013-11-25 MED ORDER — ENOXAPARIN SODIUM 40 MG/0.4ML ~~LOC~~ SOLN
40.0000 mg | Freq: Every day | SUBCUTANEOUS | Status: DC
  Administered 2013-11-26: 40 mg via SUBCUTANEOUS
  Filled 2013-11-25: qty 0.4

## 2013-11-25 MED ORDER — VANCOMYCIN HCL 10 G IV SOLR
1500.0000 mg | INTRAVENOUS | Status: DC
  Administered 2013-11-25 – 2013-11-26 (×2): 1500 mg via INTRAVENOUS
  Filled 2013-11-25 (×2): qty 1500

## 2013-11-25 MED ORDER — HYDRALAZINE HCL 50 MG PO TABS
50.0000 mg | ORAL_TABLET | Freq: Three times a day (TID) | ORAL | Status: DC
  Administered 2013-11-25 – 2013-11-26 (×4): 50 mg via ORAL
  Filled 2013-11-25 (×4): qty 1

## 2013-11-25 NOTE — Progress Notes (Signed)
Patient ID: [name] [name]  female  WUJ:[id number]    DOB: 04-27-1930    DOA: 11/19/2013  PCP: No PCP Per Patient  Assessment/Plan: Active Problems: Back pain/T10-T11 discitis/osteo +/- bacteremia -MRI of thoracic spine with severe chronic-appearing T10-T11 discitis osteomyelitis was discharged on a T10 and T11 vertebral bodies, extension of the dorsal epidural without discrete abscess. Moderate canal stenosis at T10-11 with severe neural foramina all narrowing - started on empiric antibiotics on 1/1 but following consultation with ID with a discussion of the above MRI results Dr. Baxter Flattery recommended to discontinue antibiotics and recommended to do disc aspiration to send for cultures  -Following the above lab called with preliminary blood cultures showing 1/2 bottles with GPC in clusters. Patient is currently on IV vancomycin and ceftriaxone, daily for 6 weeks if cultures remain negative. Repeat blood cultures 1/5 so far negative - s/p IR guided aspiration  Abnormal urinalysis  -Urine cultures only with multiple bacterial morphotypes present, none predominant   CKD (chronic kidney disease) stage 4, GFR 15-29 ml/min  -Baseline creatinine unknown, recheck BMET   HTN (hypertension), uncontrolled  -Resume Coreg, increase hydralazine to 50mg  TID continue PRN IV hydralazine  Bacteremia GPC in clusters -1/2  - cont vancomycin at this time.   DVT Prophylaxis: Lovenox   Code Status: full CODE STATUS   Family Communication:  Disposition:To be determined, may need skilled nursing facility for IV antibiotics    Subjective: Continues to have back pain although no fevers or chills , + nausea vomiting  Objective: Weight change:   Intake/Output Summary (Last 24 hours) at 11/25/13 1623 Last data filed at 11/25/13 0600  Gross per 24 hour  Intake 1130.83 ml  Output      0 ml  Net 1130.83 ml   Blood pressure 175/78, pulse 98, temperature 98.2 F (36.8 C), temperature source Oral, resp. rate  17, height 5\' 2"  (1.575 m), weight 103.6 kg (228 lb 6.3 oz), SpO2 95.00%.  Physical Exam: General: Alert and awake, oriented x3, not in any acute distress. CVS: S1-S2 clear, Chest: CTA B.  Abdomen: soft nontender, nondistended, normal bowel sounds  Extremities: no c/c/e bilaterally Neuro : No focal neurological deficits noted   Lab Results: Basic Metabolic Panel:  Recent Labs Lab 11/22/13 0550 11/25/13 0535  NA 144 140  K 4.0 4.1  CL 108 106  CO2 25 21  GLUCOSE 111* 125*  BUN 29* 20  CREATININE 1.93* 1.48*  CALCIUM 9.9 10.2   Liver Function Tests: No results found for this basename: AST, ALT, ALKPHOS, BILITOT, PROT, ALBUMIN,  in the last 168 hours No results found for this basename: LIPASE, AMYLASE,  in the last 168 hours No results found for this basename: AMMONIA,  in the last 168 hours CBC:  Recent Labs Lab 11/20/13 0130  11/22/13 0550 11/25/13 0535  WBC 8.7  < > 8.1 7.6  NEUTROABS 4.8  --   --   --   HGB 12.1  < > 10.7* 10.8*  HCT 37.3  < > 34.1* 34.1*  MCV 86.1  < > 87.0 86.3  PLT 202  < > 171 178  < > = values in this interval not displayed. Cardiac Enzymes: No results found for this basename: CKTOTAL, CKMB, CKMBINDEX, TROPONINI,  in the last 168 hours BNP: No components found with this basename: POCBNP,  CBG:  Recent Labs Lab 11/24/13 1222 11/24/13 1655 11/24/13 2210 11/25/13 0748 11/25/13 1612  GLUCAP 145* 136* 127* 133* 139*     Micro  Results: Recent Results (from the past 240 hour(s))  CULTURE, BLOOD (ROUTINE X 2)     Status: None   Collection Time    11/20/13  7:35 AM      Result Value Range Status   Specimen Description BLOOD LEFT ARM   Final   Special Requests BOTTLES DRAWN AEROBIC AND ANAEROBIC 5CC   Final   Culture  Setup Time     Final   Value: 11/20/2013 12:52     Performed at [facility]   Culture     Final   Value:        BLOOD CULTURE RECEIVED NO GROWTH TO DATE CULTURE WILL BE HELD FOR 5 DAYS BEFORE ISSUING A FINAL  NEGATIVE REPORT     Performed at [facility]   Report Status PENDING   Incomplete  CULTURE, BLOOD (ROUTINE X 2)     Status: None   Collection Time    11/20/13  7:50 AM      Result Value Range Status   Specimen Description BLOOD RIGHT ARM   Final   Special Requests BOTTLES DRAWN AEROBIC AND ANAEROBIC 5CC   Final   Culture  Setup Time     Final   Value: 11/20/2013 12:51     Performed at [facility]   Culture     Final   Value: STAPHYLOCOCCUS SPECIES (COAGULASE NEGATIVE)     Note: THE SIGNIFICANCE OF ISOLATING THIS ORGANISM FROM A SINGLE SET OF BLOOD CULTURES WHEN MULTIPLE SETS ARE DRAWN IS UNCERTAIN. PLEASE NOTIFY THE MICROBIOLOGY DEPARTMENT WITHIN ONE WEEK IF SPECIATION AND SENSITIVITIES ARE REQUIRED.     Note: Gram Stain Report Called to,Read Back By and Verified With: KELLY CORUM 11/21/13 @ 1:27PM BY RUSCOE A.     Performed at [facility]   Report Status 11/22/2013 FINAL   Final  URINE CULTURE     Status: None   Collection Time    11/20/13 10:07 AM      Result Value Range Status   Specimen Description URINE, CLEAN CATCH   Final   Special Requests Normal   Final   Culture  Setup Time     Final   Value: 11/20/2013 15:11     Performed at [facility]     Final   Value: 30,000 COLONIES/ML     Performed at [facility]   Culture     Final   Value: Multiple bacterial morphotypes present, none predominant. Suggest appropriate recollection if clinically indicated.     Performed at [facility]   Report Status 11/21/2013 FINAL   Final  AFB CULTURE WITH SMEAR     Status: None   Collection Time    11/22/13 11:18 AM      Result Value Range Status   Specimen Description ABSCESS BACK   Final   Special Requests T 10 T 11   Final   ACID FAST SMEAR     Final   Value: NO ACID FAST BACILLI SEEN     Performed at [facility]   Culture     Final   Value: CULTURE WILL BE EXAMINED FOR 6 WEEKS BEFORE ISSUING A FINAL REPORT      Performed at [facility]   Report Status PENDING   Incomplete  ANAEROBIC CULTURE     Status: None   Collection Time    11/22/13 11:18 AM      Result Value Range Status  Specimen Description ABSCESS BACK   Final   Special Requests T 10 T 11   Final   Gram Stain     Final   Value: NO WBC SEEN     NO SQUAMOUS EPITHELIAL CELLS SEEN     NO ORGANISMS SEEN     Performed at [facility]   Culture     Final   Value: NO ANAEROBES ISOLATED; CULTURE IN PROGRESS FOR 5 DAYS     Performed at [facility]   Report Status PENDING   Incomplete  FUNGUS CULTURE W SMEAR     Status: None   Collection Time    11/22/13 11:18 AM      Result Value Range Status   Specimen Description ABSCESS BACK   Final   Special Requests T 10 T 11   Final   Fungal Smear     Final   Value: NO YEAST OR FUNGAL ELEMENTS SEEN     Performed at [facility]   Culture     Final   Value: CULTURE IN PROGRESS FOR FOUR WEEKS     Performed at [facility]   Report Status PENDING   Incomplete  CULTURE, ROUTINE-ABSCESS     Status: None   Collection Time    11/22/13 11:19 AM      Result Value Range Status   Specimen Description ABSCESS BACK   Final   Special Requests T 10 T 11   Final   Gram Stain     Final   Value: RARE WBC PRESENT,BOTH PMN AND MONONUCLEAR     NO SQUAMOUS EPITHELIAL CELLS SEEN     NO ORGANISMS SEEN     Performed at [facility]   Culture     Final   Value: NO GROWTH 3 DAYS     Performed at [facility]   Report Status 11/25/2013 FINAL   Final  CULTURE, BLOOD (ROUTINE X 2)     Status: None   Collection Time    11/23/13  2:55 PM      Result Value Range Status   Specimen Description BLOOD LEFT HAND   Final   Special Requests BOTTLES DRAWN AEROBIC ONLY 10CC   Final   Culture  Setup Time     Final   Value: 11/23/2013 20:12     Performed at [facility]   Culture     Final   Value:        BLOOD CULTURE RECEIVED NO GROWTH TO DATE CULTURE  WILL BE HELD FOR 5 DAYS BEFORE ISSUING A FINAL NEGATIVE REPORT     Performed at [facility]   Report Status PENDING   Incomplete  CULTURE, BLOOD (ROUTINE X 2)     Status: None   Collection Time    11/23/13  3:05 PM      Result Value Range Status   Specimen Description BLOOD LEFT HAND   Final   Special Requests BOTTLES DRAWN AEROBIC ONLY 5CC   Final   Culture  Setup Time     Final   Value: 11/23/2013 20:10     Performed at [facility]   Culture     Final   Value:        BLOOD CULTURE RECEIVED NO GROWTH TO DATE CULTURE WILL BE HELD FOR 5 DAYS BEFORE ISSUING A FINAL NEGATIVE REPORT     Performed at [facility]   Report Status PENDING   Incomplete  Studies/Results: Dg Thoracic Spine W/swimmers  11/20/2013   CLINICAL DATA:  History of fall yesterday.  Difficulty moving.  EXAM: THORACIC SPINE - 2 VIEW + SWIMMERS  COMPARISON:  No priors.  FINDINGS: Irregularity of the inferior endplate of QA348G and superior endplate of 624THL is highly irregular and concerning for discitis/osteomyelitis. There is some compression of both of these vertebral bodies with approximately 50% loss of height at both levels, and mild acute kyphotic deformity centered at T10-T11. There is mild multilevel degenerative disc disease, including what appears to be complete bony fusion between T8 and T9. Incidentally imaged portions of the thorax demonstrate diffuse interstitial prominence throughout the lungs bilaterally, most pronounced in the periphery of the lungs, concerning for the underlying interstitial lung disease.  IMPRESSION: 1. Highly unusual appearance of T10 and T11, as above, which is concern for potential discitis/osteomyelitis. Given the patient's history of recent fall, these findings could simply be related to acute trauma, however, the marked irregularity of the endplates at this level raises concern for infection. Correlation with MRI of the thoracic spine with and without IV gadolinium is  recommended. 2. The appearance of the lung parenchyma suggests an underlying interstitial lung disease. This could be better evaluated with followup nonemergent high-resolution chest CT if clinically appropriate. These results were called by telephone at the time of interpretation on 11/20/2013 at 12:23 AM toPETER DAMMEN, PA, who verbally acknowledged these results.   Electronically Signed   By: Vinnie Langton M.D.   On: 11/20/2013 00:25   Dg Lumbar Spine Complete  11/20/2013   CLINICAL DATA:  History of fall complaining of difficulty moving.  EXAM: LUMBAR SPINE - COMPLETE 4+ VIEW  COMPARISON:  No priors.  FINDINGS: Five views of the lumbar spine demonstrate no definite acute displaced fracture or compression type fracture in the lumbar spine. Alignment is anatomic. Mild multilevel degenerative disc disease.  Incidentally imaged portions of the lower thoracic spine again demonstrate marked irregularity of T10 and T11 where there is evidence of bony destruction of the inferior endplate of QA348G and superior endplate of 624THL with widening of the intervening disc space, highly concerning for discitis and osteomyelitis. At both of these levels there is significant loss of vertebral body height (approximately 50%).  IMPRESSION: 1. The appearance of T10 and T11 remains concerning for potential discitis/osteomyelitis, and correlation with MRI of the thoracic spine with and without IV gadolinium is strongly recommended. 2. No acute radiographic abnormality of the lumbar spine itself.   Electronically Signed   By: Vinnie Langton M.D.   On: 11/20/2013 00:28   Mr Lumbar Spine Wo Contrast  11/20/2013   CLINICAL DATA:  Back pain, evaluate for discitis.  EXAM: MRI LUMBAR SPINE WITHOUT CONTRAST  TECHNIQUE: Multiplanar, multisequence MR imaging was performed. No intravenous contrast was administered (low GFR of 30, stage 4 kidney disease).  COMPARISON:  Lumbar spine radiograph November 19, 2013  FINDINGS: Lumbar vertebral bodies  and posterior elements are intact and aligned with maintenance of lumbar lordosis. Intervertebral discs demonstrate normal morphology, decreased T2 signal within all disc most consistent mild desiccation. Minimal STIR signal within the L5-S1 facets favoring reactive changes associated with small effusions. No suspicious STIR signal within the vertebral bodies or discs.  Included view of the thoracic spine demonstrates T11-12 broad-based disc bulge fluid signal within the disc common destructive process of the T11 vertebral body incompletely characterized. Conus medullaris terminates at L1-2 and appears normal in morphology and signal characteristics. Cauda equina is nonsuspicious. Multiple T2  hyperintensities arising from the kidneys may reflect cyst. Multiple small low signal lesions throughout the uterus most consistent with leiomyomata. Partially imaged diverticulosis.  Level by level evaluation:  L1-2: Minimal annular bulging without canal stenosis or neural foraminal narrowing.  L2-3: Small bilateral far lateral disc protrusions. Mild facet arthropathy and ligamentum flavum redundancy. Minimal canal stenosis. Mild bilateral neural foraminal narrowing.  L3-4: 4 mm broad-based disc bulge. Moderate facet arthropathy and ligamentum flavum redundancy with bilateral facet effusions measuring up to 3 mm on the right. Moderate canal stenosis, AP dimension of the thecal sac is 7 mm. Mild to moderate right, mild left neural foraminal narrowing.  L4-5: 2 mm broad-based disc bulge is superimposed small left far lateral disc protrusion and annular tear. Mild to moderate facet arthropathy and ligamentum flavum redundancy with trace facet effusions which are likely reactive. Mild canal stenosis. Mild bilateral neural foraminal narrowing.  L5-S1 4 mm broad-based disc bulge asymmetric to the right may encroach upon the exited right L5 nerve. Moderate to severe facet arthropathy and ligamentum flavum redundancy with trace facet  effusions which are likely reactive. No canal stenosis. Severe right, moderate to severe left neural foraminal narrowing.  IMPRESSION: Abnormal destructive changes of the T10-11 disc, please see dedicated MRI of the thoracic spine from same day, reported separately for further description. No MR findings of discitis osteomyelitis in the lumbar spine.  Degenerative change of the lumbar spine: Moderate canal stenosis at L3-4, mild at L4-5.  Neural foraminal narrowing L2-3 to L5-S1: Severe on the right at L5-S1.  Mildly widened facets at L3-4, which can be associated with dynamic instability and could be further characterize with flexion and extension radiographs if clinically indicated.  Critical Value/emergent results were called by telephone at the time of interpretation on 11/20/2013 at 7:57 PM to Dr. Kathline Magic, who verbally acknowledged these results.   Electronically Signed   By: Elon Alas   On: 11/20/2013 19:58   Mr Thoracic Spine W Wo Contrast  11/20/2013   ADDENDUM REPORT: 11/20/2013 19:58  ADDENDUM: Critical Value/emergent results were called by telephone at the time of interpretation on 11/20/2013 at 7:57 PM to Dr. Kathline Magic, who verbally acknowledged these results.   Electronically Signed   By: Elon Alas   On: 11/20/2013 19:58   11/20/2013   CLINICAL DATA:  Pain, rule out discitis.  EXAM: MRI THORACIC SPINE WITHOUT AND WITH CONTRAST  TECHNIQUE: Multiplanar and multiecho pulse sequences of the thoracic spine were obtained without and with intravenous contrast.  CONTRAST:  None: Due to low GFR (30) contrast was not administered.  COMPARISON:  Thoracic radiographs November 19, 2013  FINDINGS: Low T1, bright T2 signal intradiscal mass at T10-11 with complete destruction of the T10 body and inferior endplate, T11 body at and superior endplate. Abnormal fluid/phlegmon extends of the prevertebral soft tissues, right pedicle and posterior elements. 4 mm retropulsed disc material and bony  fragments, in addition to facet arthropathy results in moderate canal stenosis at T10-11, AP dimension of the canal is 7 mm. Severe T10-11 neural foraminal narrowing. In addition, severe T11-12 disc height loss with fluid signal within the T11-12 disc associated with moderate subacute to chronic discogenic endplate changes and moderate (4 mm) broad-based disc bulge resulting in mild canal stenosis. Moderate to severe right, moderate left T11-12 neural foraminal narrowing.  The remaining thoracic vertebral bodies and posterior elements are intact and aligned. T8-9 disc height loss, with bridging bone marrow signal suggests auto interbody arthrodesis. Mild discogenic endplate  changes of the upper thoracic levels. Scattered chronic Schmorl's nodes.  Thoracic spinal cord deformity at T10-11 due to canal stenosis, with no abnormal cord signal ; conus medullaris terminates at L1. Minimal bright interstitial surgeries STIR signal at T10-11 consistent with grade 1 strain.  Small broad-based disc bulge at T7-8 results in mild canal stenosis.  Small bilateral pleural effusions.  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 Moderate canal stenosis at T10-11 with severe neural foraminal narrowing.  Abnormal signal within T11 disc concerning for early discitis without osteomyelitis. Degenerative change at this level resulting in mild canal stenosis and moderate to severe right, moderate left neural foraminal narrowing.  Grade 1 paraspinal muscle strain T10-11.  Electronically Signed: By: Elon Alas On: 11/20/2013 19:06    Medications: Scheduled Meds: . carvedilol  25 mg Oral BID WC  . cefTRIAXone (ROCEPHIN)  IV  2 g Intravenous Q24H  . clobetasol cream  1 application Topical BID  . [START ON 11/26/2013] enoxaparin (LOVENOX) injection  40 mg Subcutaneous Daily  . feeding  supplement (GLUCERNA SHAKE)  237 mL Oral BID BM  . hydrALAZINE  50 mg Oral BID  . insulin aspart  0-5 Units Subcutaneous QHS  . insulin aspart  0-9 Units Subcutaneous TID WC  . multivitamin with minerals  1 tablet Oral Daily  . simvastatin  20 mg Oral q1800  . sodium chloride  10-40 mL Intracatheter Q12H  . vancomycin  1,500 mg Intravenous Q24H  . vitamin C  500 mg Oral Daily      LOS: 6 days   Shakisha Abend M.D. [facility] 11/25/2013, 4:23 PM Pager: [phone]  If 7PM-7AM, please contact night-coverage [email or web address] Password [email or web address]

## 2013-11-25 NOTE — Progress Notes (Signed)
Physical Therapy Treatment Patient Details Name: [name] [name] MRN: [id number] DOB: 08/30/1930 Today's Date: 11/25/2013 Time: 0912-0938 PT Time Calculation (min): 26 min  PT Assessment / Plan / Recommendation  History of Present Illness Patient is an 80 year old female with history of hypertension, hypercholesterolemia, diabetes and gout who presents with back pain after a fall that occurred several hours PTA. Pt describes pain as persistent and sharp, 10/10 in severity, worse with movement and no specific alleviating symptoms. NO specific radiating symptom but it does involve almost entire back area. She was with family trying to get into a recliner chair when she was sitting too close to the edge and slid down from the chair. Per family, pt had three other falls at home this past year and has been progressively weaker. Pt denies chest pain, shortness of breath, no specific abdominal or urinary concerns. TRH asked to admit for further evaluation given findings on XRAY imaging of possible discitis vs osteomyelitis.   PT Comments   Pt continues to be limited by severe pain requiring +2 for all transfers.  Pt with episode of nausea/vomiting at end of session, RN made aware.  Follow Up Recommendations  SNF     Does the patient have the potential to tolerate intense rehabilitation     Barriers to Discharge        Equipment Recommendations  None recommended by PT    Recommendations for Other Services    Frequency Min 3X/week   Progress towards PT Goals Progress towards PT goals: Progressing toward goals  Plan Current plan remains appropriate    Precautions / Restrictions Precautions Precautions: Fall Restrictions Weight Bearing Restrictions: No   Pertinent Vitals/Pain Pt c/o 10/10 back pain after transfer, RN aware    Mobility    pt max A with bed mobility, able to move LEs off of bed but max A for trunk.  Pt +2 total assist for stand pivot transfer with RW to recliner.  Pt unable to  extend trunk in standing, very forward flexed.  +2 for safety, lifting and wt shifting for transfer. Educated pt on positioning for comfort with back pain, pt states she is still uncomfortable despite multiple attempts to decrease pain/discomfort with positioning   Exercises General Exercises - Lower Extremity Ankle Circles/Pumps: AROM;Both;10 reps Long Arc Quad: AROM;Both;10 reps Hip Flexion/Marching: AROM;Both;10 reps      PT Goals (current goals can now be found in the care plan section)    Visit Information  Last PT Received On: 11/25/13 Assistance Needed: +2 History of Present Illness: Patient is an 80 year old female with history of hypertension, hypercholesterolemia, diabetes and gout who presents with back pain after a fall that occurred several hours PTA. Pt describes pain as persistent and sharp, 10/10 in severity, worse with movement and no specific alleviating symptoms. NO specific radiating symptom but it does involve almost entire back area. She was with family trying to get into a recliner chair when she was sitting too close to the edge and slid down from the chair. Per family, pt had three other falls at home this past year and has been progressively weaker. Pt denies chest pain, shortness of breath, no specific abdominal or urinary concerns. TRH asked to admit for further evaluation given findings on XRAY imaging of possible discitis vs osteomyelitis.    Subjective Data      Cognition  Cognition Arousal/Alertness: Awake/alert Behavior During Therapy: WFL for tasks assessed/performed Overall Cognitive Status: Within Functional Limits for tasks assessed  Balance     End of Session PT - End of Session Equipment Utilized During Treatment: Gait belt Activity Tolerance: Patient limited by pain Patient left: with family/visitor present;with nursing/sitter in room;in chair;with call bell/phone within reach Nurse Communication: Mobility status   GP      DONAWERTH,KAREN 11/25/2013, 11:36 AM

## 2013-11-25 NOTE — Progress Notes (Signed)
Pt. Given PRN zofran and dilaudid d/t second vomiting episode.  Will continue to monitor. Syliva Overman

## 2013-11-25 NOTE — Progress Notes (Signed)
Pt. Got up in chair with PT earlier and vomited.  MD in to see now and informed.  Will continue to monitor. Syliva Overman

## 2013-11-25 NOTE — Progress Notes (Addendum)
CSW provided daughter Krystyna Cleckley [phone] bed offers for SNF placement via phone. Pts daughter is interested in [facility] and they have given a bed offer.   CSW notified facility and Danae Chen [facility] Rep) for placement and authorization from insurance.   CSW will notify Pts daughter day of d/c.    CSW will follow for d/c planning.    [facility]  [address];  [phone] Phone: [phone]

## 2013-11-25 NOTE — Progress Notes (Signed)
[place] for Infectious Disease    Date of Admission:  11/19/2013   Total days of antibiotics 5        Day 5 vanco        Day 3 ceftriaxone           ID: [name] [name] is a 80 y.o. female  with DM, HTN, CKD 4 who presents with worsening back pain and recent falls found to have MRI findings concerning for T10-11 disckitis/osteo  Active Problems:   Back pain   CKD (chronic kidney disease) stage 4, GFR 15-29 ml/min   HTN (hypertension)   Abnormal urinalysis   Hypokalemia    Subjective: Remains afebrile. Sleepy after having lunch   Medications:  . carvedilol  25 mg Oral BID WC  . cefTRIAXone (ROCEPHIN)  IV  2 g Intravenous Q24H  . clobetasol cream  1 application Topical BID  . [START ON 11/26/2013] enoxaparin (LOVENOX) injection  40 mg Subcutaneous Daily  . feeding supplement (GLUCERNA SHAKE)  237 mL Oral BID BM  . hydrALAZINE  50 mg Oral BID  . insulin aspart  0-5 Units Subcutaneous QHS  . insulin aspart  0-9 Units Subcutaneous TID WC  . multivitamin with minerals  1 tablet Oral Daily  . simvastatin  20 mg Oral q1800  . sodium chloride  10-40 mL Intracatheter Q12H  . vancomycin  1,500 mg Intravenous Q24H  . vitamin C  500 mg Oral Daily    Objective: Vital signs in last 24 hours: Temp:  [98.2 F (36.8 C)-98.8 F (37.1 C)] 98.2 F (36.8 C) (01/07 1315) Pulse Rate:  [86-98] 98 (01/07 1315) Resp:  [17-20] 17 (01/07 1315) BP: (137-184)/(46-78) 175/78 mmHg (01/07 1305) SpO2:  [95 %-100 %] 95 % (01/07 1315)   Constitutional:oriented to person, place, and time. appears well-developed and well-nourished. No distress.  HENT:  Mouth/Throat: Oropharynx is clear and moist. No oropharyngeal exudate.  Cardiovascular: Normal rate, regular rhythm and normal heart sounds. Exam reveals no gallop and no friction rub.  No murmur heard.  Pulmonary/Chest: Effort normal and breath sounds normal. No respiratory distress.  has no wheezes.  Abdominal: Soft. Bowel sounds are normal.   exhibits no distension. There is no tenderness.  Lymphadenopathy:  no cervical adenopathy.  Neurological:  alert and oriented to person, place, and time.  Skin: Skin is warm and dry. No rash noted. No erythema.    Lab Results  Recent Labs  11/25/13 0535  WBC 7.6  HGB 10.8*  HCT 34.1*  NA 140  K 4.1  CL 106  CO2 21  BUN 20  CREATININE 1.48*   Lab Results  Component Value Date   ESRSEDRATE 27* 11/20/2013   Microbiology: 1/2 blood cx 1 of 2 CoNs 1/4 aspirate: pending  Studies/Results: Dg Chest Port 1 View  11/25/2013   CLINICAL DATA:  Short of breath.  Chest pain.  EXAM: PORTABLE CHEST - 1 VIEW  COMPARISON:  None available.  FINDINGS: Right upper extremity PICC is present with the tip in the right atrium. This could be retracted about 2 cm for positioning at the cavoatrial junction. Cardiopericardial silhouette is borderline for projection. There is a diffuse reticular pattern in the lungs. Differential considerations are interstitial pulmonary edema, atypical pneumonia or chronic interstitial lung disease.  Aortic arch atherosclerosis is present bilateral humeral head AVN with secondary glenohumeral osteoarthritis. Blunting of the left costophrenic angle is present suggesting small pleural effusion. Comparing to prior thoracic spine radiographs of 11/19/2013, the  reticular pattern in the lungs was present at that time suggesting some element of interstitial lung disease.  IMPRESSION: 1. Right upper extremity PICC with the tip in the right atrium. This could be retracted 2 cm for positioning at the junction of the superior vena cava and right atrium. 2. Diffuse reticular pattern in the lungs suggesting chronic interstitial lung disease. Other differential considerations discussed above. 3. Borderline cardiomegaly.   Electronically Signed   By: Dereck Ligas M.D.   On: 11/25/2013 10:43    Assessment/Plan: 80 y.o. female  with DM, HTN, CKD 4 who presents with worsening back pain and  recent falls found to have MRI findings concerning for T10-11 disckitis/osteo   Presumed diskitis/oseto = will treat with IV vancomycin and ceftriaxone 2gm IV daily x 6 wks   bacteremia = not sure if true pathogen since only 1 of 2 sets positive with CoNS, await to correlate with aspirate cultures. Appears to be c/w contaminant. Will repeat blood cultures  Will establish follow up in [place], [facility] for Infectious Diseases Cell: [phone] Pager: [phone]  11/25/2013, 2:30 PM

## 2013-11-25 NOTE — Progress Notes (Addendum)
ANTIBIOTIC CONSULT NOTE - FOLLOW UP  Pharmacy Consult for Vancomycin Indication: Discitis/Osteomyelitis  Allergies  Allergen Reactions  . Motrin [Ibuprofen] Hives    Patient Measurements: Height: 5\' 2"  (157.5 cm) Weight: 228 lb 6.3 oz (103.6 kg) IBW/kg (Calculated) : 50.1    Vital Signs: Temp: 98.4 F (36.9 C) (01/07 0508) BP: 168/76 mmHg (01/07 0932) Pulse Rate: 93 (01/07 0740) Intake/Output from previous day: 01/06 0701 - 01/07 0700 In: 2482.8 [P.O.:610; I.V.:1872.8] Out: 225 [Urine:225] Intake/Output from this shift:    Labs:  Recent Labs  11/25/13 0535  WBC 7.6  HGB 10.8*  PLT 178  CREATININE 1.48*   Estimated Creatinine Clearance: 33.6 ml/min (by C-G formula based on Cr of 1.48). No results found for this basename: VANCOTROUGH, VANCOPEAK, VANCORANDOM, [place], [place], [place], TOBRATROUGH, TOBRAPEAK, TOBRARND, AMIKACINPEAK, AMIKACINTROU, AMIKACIN,  in the last 72 hours   Microbiology: Recent Results (from the past 720 hour(s))  CULTURE, BLOOD (ROUTINE X 2)     Status: None   Collection Time    11/20/13  7:35 AM      Result Value Range Status   Specimen Description BLOOD LEFT ARM   Final   Special Requests BOTTLES DRAWN AEROBIC AND ANAEROBIC 5CC   Final   Culture  Setup Time     Final   Value: 11/20/2013 12:52     Performed at [facility]   Culture     Final   Value:        BLOOD CULTURE RECEIVED NO GROWTH TO DATE CULTURE WILL BE HELD FOR 5 DAYS BEFORE ISSUING A FINAL NEGATIVE REPORT     Performed at [facility]   Report Status PENDING   Incomplete  CULTURE, BLOOD (ROUTINE X 2)     Status: None   Collection Time    11/20/13  7:50 AM      Result Value Range Status   Specimen Description BLOOD RIGHT ARM   Final   Special Requests BOTTLES DRAWN AEROBIC AND ANAEROBIC 5CC   Final   Culture  Setup Time     Final   Value: 11/20/2013 12:51     Performed at [facility]   Culture     Final   Value: STAPHYLOCOCCUS SPECIES  (COAGULASE NEGATIVE)     Note: THE SIGNIFICANCE OF ISOLATING THIS ORGANISM FROM A SINGLE SET OF BLOOD CULTURES WHEN MULTIPLE SETS ARE DRAWN IS UNCERTAIN. PLEASE NOTIFY THE MICROBIOLOGY DEPARTMENT WITHIN ONE WEEK IF SPECIATION AND SENSITIVITIES ARE REQUIRED.     Note: Gram Stain Report Called to,Read Back By and Verified With: KELLY CORUM 11/21/13 @ 1:27PM BY RUSCOE A.     Performed at [facility]   Report Status 11/22/2013 FINAL   Final  URINE CULTURE     Status: None   Collection Time    11/20/13 10:07 AM      Result Value Range Status   Specimen Description URINE, CLEAN CATCH   Final   Special Requests Normal   Final   Culture  Setup Time     Final   Value: 11/20/2013 15:11     Performed at [place]     Final   Value: 30,000 COLONIES/ML     Performed at [facility]   Culture     Final   Value: Multiple bacterial morphotypes present, none predominant. Suggest appropriate recollection if clinically indicated.     Performed at [facility]   Report Status 11/21/2013 FINAL  Final  AFB CULTURE WITH SMEAR     Status: None   Collection Time    11/22/13 11:18 AM      Result Value Range Status   Specimen Description ABSCESS BACK   Final   Special Requests T 10 T 11   Final   ACID FAST SMEAR     Final   Value: NO ACID FAST BACILLI SEEN     Performed at [facility]   Culture     Final   Value: CULTURE WILL BE EXAMINED FOR 6 WEEKS BEFORE ISSUING A FINAL REPORT     Performed at [facility]   Report Status PENDING   Incomplete  ANAEROBIC CULTURE     Status: None   Collection Time    11/22/13 11:18 AM      Result Value Range Status   Specimen Description ABSCESS BACK   Final   Special Requests T 10 T 11   Final   Gram Stain     Final   Value: NO WBC SEEN     NO SQUAMOUS EPITHELIAL CELLS SEEN     NO ORGANISMS SEEN     Performed at [facility]   Culture     Final   Value: NO ANAEROBES ISOLATED; CULTURE IN  PROGRESS FOR 5 DAYS     Performed at [facility]   Report Status PENDING   Incomplete  FUNGUS CULTURE W SMEAR     Status: None   Collection Time    11/22/13 11:18 AM      Result Value Range Status   Specimen Description ABSCESS BACK   Final   Special Requests T 10 T 11   Final   Fungal Smear     Final   Value: NO YEAST OR FUNGAL ELEMENTS SEEN     Performed at [facility]   Culture     Final   Value: CULTURE IN PROGRESS FOR FOUR WEEKS     Performed at [facility]   Report Status PENDING   Incomplete  CULTURE, ROUTINE-ABSCESS     Status: None   Collection Time    11/22/13 11:19 AM      Result Value Range Status   Specimen Description ABSCESS BACK   Final   Special Requests T 10 T 11   Final   Gram Stain     Final   Value: RARE WBC PRESENT,BOTH PMN AND MONONUCLEAR     NO SQUAMOUS EPITHELIAL CELLS SEEN     NO ORGANISMS SEEN     Performed at [facility]   Culture     Final   Value: NO GROWTH 3 DAYS     Performed at [facility]   Report Status 11/25/2013 FINAL   Final  CULTURE, BLOOD (ROUTINE X 2)     Status: None   Collection Time    11/23/13  2:55 PM      Result Value Range Status   Specimen Description BLOOD LEFT HAND   Final   Special Requests BOTTLES DRAWN AEROBIC ONLY 10CC   Final   Culture  Setup Time     Final   Value: 11/23/2013 20:12     Performed at [facility]   Culture     Final   Value:        BLOOD CULTURE RECEIVED NO GROWTH TO DATE CULTURE WILL BE HELD FOR 5 DAYS BEFORE ISSUING A FINAL NEGATIVE REPORT  Performed at [facility]   Report Status PENDING   Incomplete  CULTURE, BLOOD (ROUTINE X 2)     Status: None   Collection Time    11/23/13  3:05 PM      Result Value Range Status   Specimen Description BLOOD LEFT HAND   Final   Special Requests BOTTLES DRAWN AEROBIC ONLY 5CC   Final   Culture  Setup Time     Final   Value: 11/23/2013 20:10     Performed at [facility]   Culture      Final   Value:        BLOOD CULTURE RECEIVED NO GROWTH TO DATE CULTURE WILL BE HELD FOR 5 DAYS BEFORE ISSUING A FINAL NEGATIVE REPORT     Performed at [facility]   Report Status PENDING   Incomplete    Anti-infectives   Start     Dose/Rate Route Frequency Ordered Stop   11/23/13 1100  vancomycin (VANCOCIN) 1,500 mg in sodium chloride 0.9 % 500 mL IVPB     1,500 mg 250 mL/hr over 120 Minutes Intravenous Every 48 hours 11/21/13 1634     11/23/13 1000  vancomycin (VANCOCIN) 1,500 mg in sodium chloride 0.9 % 500 mL IVPB  Status:  Discontinued     1,500 mg 250 mL/hr over 120 Minutes Intravenous Every 48 hours 11/21/13 0950 11/21/13 1045   11/22/13 1400  cefTRIAXone (ROCEPHIN) 2 g in dextrose 5 % 50 mL IVPB     2 g 100 mL/hr over 30 Minutes Intravenous Every 24 hours 11/22/13 1214     11/22/13 0600  vancomycin (VANCOCIN) IVPB 1000 mg/200 mL premix  Status:  Discontinued     1,000 mg 200 mL/hr over 60 Minutes Intravenous Every 48 hours 11/20/13 0455 11/21/13 0950   11/21/13 1000  vancomycin (VANCOCIN) 2,000 mg in sodium chloride 0.9 % 500 mL IVPB     2,000 mg 250 mL/hr over 120 Minutes Intravenous STAT 11/21/13 0950 11/21/13 1235   11/20/13 0600  vancomycin (VANCOCIN) 1,500 mg in sodium chloride 0.9 % 500 mL IVPB  Status:  Discontinued     1,500 mg 250 mL/hr over 120 Minutes Intravenous  Once 11/20/13 0455 11/21/13 0949   11/20/13 0530  piperacillin-tazobactam (ZOSYN) IVPB 3.375 g  Status:  Discontinued     3.375 g 12.5 mL/hr over 240 Minutes Intravenous 3 times per day 11/20/13 0455 11/21/13 1045      Assessment: Pt on Vancomycin Day #5, Rocephin Day #4 for osteo/discitis. Appears CONS in 1/2 blood cx may be contaminant - repeat bld cx pending. Afeb. WBC wnl.  Plan for 6 weeks IV Rocephin/Vanc per ID MD. 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  1/3 Vanco>>      1/3 VR <5 (vanc dose 1/2 not given) 1/2 Zosyn>>1/3 1/4 Rocephin>>  1/2 BC x 2>> 1/2 CONS  1/2 UCx: neg 1/4: Back abscess>>ngtd 1/5 Bld x 2>>ngtd  Nephrology: CKD 4 (baseline unknown). ARF improving with Scr down 1.48 (1.93 pk). Lytes ok. UOP not charted accurately.  Goal of Therapy:  Vancomycin trough level 15-20 mcg/ml  Plan:  1) Change Vancomycin to 1500 g IV q24h. 2) Plan to check Vancomycin trough at [facility] with 4th dose 3) Will continue to f/u micro data, renal function 4) Will also change Lovenox to 40mg  daily with improvement in renal function  Sherlon Handing, PharmD, BCPS Clinical pharmacist, pager [phone] 11/25/2013,9:59 AM

## 2013-11-26 LAB — CBC
HCT: 33.8 % — ABNORMAL LOW (ref 36.0–46.0)
Hemoglobin: 10.6 g/dL — ABNORMAL LOW (ref 12.0–15.0)
MCH: 27.6 pg (ref 26.0–34.0)
MCHC: 31.4 g/dL (ref 30.0–36.0)
MCV: 88 fL (ref 78.0–100.0)
PLATELETS: 186 10*3/uL (ref 150–400)
RBC: 3.84 MIL/uL — AB (ref 3.87–5.11)
RDW: 16.4 % — AB (ref 11.5–15.5)
WBC: 8.7 10*3/uL (ref 4.0–10.5)

## 2013-11-26 LAB — GLUCOSE, CAPILLARY
GLUCOSE-CAPILLARY: 149 mg/dL — AB (ref 70–99)
Glucose-Capillary: 112 mg/dL — ABNORMAL HIGH (ref 70–99)

## 2013-11-26 LAB — CULTURE, BLOOD (ROUTINE X 2): Culture: NO GROWTH

## 2013-11-26 LAB — BASIC METABOLIC PANEL
BUN: 20 mg/dL (ref 6–23)
CALCIUM: 10.5 mg/dL (ref 8.4–10.5)
CO2: 24 meq/L (ref 19–32)
CREATININE: 1.58 mg/dL — AB (ref 0.50–1.10)
Chloride: 106 mEq/L (ref 96–112)
GFR calc Af Amer: 34 mL/min — ABNORMAL LOW (ref >90)
GFR, EST NON AFRICAN AMERICAN: 30 mL/min — AB (ref >90)
GLUCOSE: 111 mg/dL — AB (ref 70–99)
Potassium: 4.2 mEq/L (ref 3.7–5.3)
SODIUM: 143 meq/L (ref 137–147)

## 2013-11-26 MED ORDER — HYDROCODONE-ACETAMINOPHEN 5-325 MG PO TABS: 1.0000 | ORAL_TABLET | Freq: Four times a day (QID) | ORAL | Status: DC | PRN

## 2013-11-26 MED ORDER — ENSURE PUDDING PO PUDG: 1.0000 | ORAL | Status: DC

## 2013-11-26 MED ORDER — LIDOCAINE 5 % EX PTCH: 1.0000 | MEDICATED_PATCH | CUTANEOUS | Status: DC

## 2013-11-26 MED ORDER — FUROSEMIDE 40 MG PO TABS: 20.0000 mg | ORAL_TABLET | Freq: Every day | ORAL | Status: DC

## 2013-11-26 MED ORDER — GLUCERNA SHAKE PO LIQD: 237.0000 mL | ORAL | Status: DC

## 2013-11-26 MED ORDER — SODIUM CHLORIDE 0.9 % IJ SOLN: 10.0000 mL | INTRAMUSCULAR | Status: DC | PRN

## 2013-11-26 MED ORDER — HYDRALAZINE HCL 50 MG PO TABS: 50.0000 mg | ORAL_TABLET | Freq: Three times a day (TID) | ORAL | Status: DC

## 2013-11-26 MED ORDER — VANCOMYCIN HCL 10 G IV SOLR: 1500.0000 mg | INTRAVENOUS | Status: DC

## 2013-11-26 MED ORDER — DEXTROSE 5 % IV SOLN: 2.0000 g | INTRAVENOUS | Status: DC

## 2013-11-26 MED ORDER — CARVEDILOL 25 MG PO TABS: 12.5000 mg | ORAL_TABLET | Freq: Two times a day (BID) | ORAL | Status: DC

## 2013-11-26 MED ORDER — GLUCERNA SHAKE PO LIQD: 237.0000 mL | Freq: Two times a day (BID) | ORAL | Status: DC

## 2013-11-26 MED ORDER — HEPARIN SOD (PORK) LOCK FLUSH 100 UNIT/ML IV SOLN
250.0000 [IU] | INTRAVENOUS | Status: AC | PRN
  Administered 2013-11-26 (×2)

## 2013-11-26 MED ORDER — TRAMADOL HCL 50 MG PO TABS: 50.0000 mg | ORAL_TABLET | Freq: Four times a day (QID) | ORAL | Status: DC | PRN

## 2013-11-26 MED ORDER — ADULT MULTIVITAMIN W/MINERALS CH: 1.0000 | ORAL_TABLET | Freq: Every day | ORAL | Status: DC

## 2013-11-26 NOTE — Progress Notes (Signed)
CSW spoke with Shirlean Mylar from [facility] and Pt has an authorization ([id number]) and can transfer to the facility when facility clears Pt for transport.     [facility]  [address]N 1[address]16;  [phone] Phone: [phone]

## 2013-11-26 NOTE — Progress Notes (Signed)
CSW received a call requesting Medicare Number for placement and auth at [facility].   CSW contacted [facility] and informed facility that Pt only has [place] in the system.   Facility will continue with seeking [employer] Auth Cert.   CSW to follow for d/c planning    [facility]  4N 1-16;  6N1-16 Phone: [phone]

## 2013-11-26 NOTE — Progress Notes (Signed)
CSW spoke with Angela Nevin at [facility] and they do have the Medicare Number and are working with [facility] to receive authorization.   CSW will continue to follow for d/c planning for possibly today.    [facility]  4[address] 1[address]16;  [phone] Phone: [phone]

## 2013-11-26 NOTE — Progress Notes (Signed)
ANTIBIOTIC CONSULT NOTE - FOLLOW UP  Pharmacy Consult for Vancomycin Indication: Discitis/Osteomyelitis  Allergies  Allergen Reactions  . Motrin [Ibuprofen] Hives    Patient Measurements: Height: 5\' 2"  (157.5 cm) Weight: 228 lb 6.3 oz (103.6 kg) IBW/kg (Calculated) : 50.1    Vital Signs: Temp: 98.3 F (36.8 C) (01/08 0524) Temp src: Oral (01/08 0524) BP: 150/50 mmHg (01/08 0524) Pulse Rate: 101 (01/08 0524) Intake/Output from previous day:   Intake/Output from this shift:    Labs:  Recent Labs  11/25/13 0535 11/26/13 0525  WBC 7.6 8.7  HGB 10.8* 10.6*  PLT 178 186  CREATININE 1.48* 1.58*   Estimated Creatinine Clearance: 31.5 ml/min (by C-G formula based on Cr of 1.58). No results found for this basename: VANCOTROUGH, VANCOPEAK, VANCORANDOM, [place], [place], [place], TOBRATROUGH, TOBRAPEAK, TOBRARND, AMIKACINPEAK, AMIKACINTROU, AMIKACIN,  in the last 72 hours   Microbiology: Recent Results (from the past 720 hour(s))  CULTURE, BLOOD (ROUTINE X 2)     Status: None   Collection Time    11/20/13  7:35 AM      Result Value Range Status   Specimen Description BLOOD LEFT ARM   Final   Special Requests BOTTLES DRAWN AEROBIC AND ANAEROBIC 5CC   Final   Culture  Setup Time     Final   Value: 11/20/2013 12:52     Performed at [facility]   Culture     Final   Value: NO GROWTH 5 DAYS     Performed at [facility]   Report Status 11/26/2013 FINAL   Final  CULTURE, BLOOD (ROUTINE X 2)     Status: None   Collection Time    11/20/13  7:50 AM      Result Value Range Status   Specimen Description BLOOD RIGHT ARM   Final   Special Requests BOTTLES DRAWN AEROBIC AND ANAEROBIC 5CC   Final   Culture  Setup Time     Final   Value: 11/20/2013 12:51     Performed at [facility]   Culture     Final   Value: STAPHYLOCOCCUS SPECIES (COAGULASE NEGATIVE)     Note: THE SIGNIFICANCE OF ISOLATING THIS ORGANISM FROM A SINGLE SET OF BLOOD CULTURES  WHEN MULTIPLE SETS ARE DRAWN IS UNCERTAIN. PLEASE NOTIFY THE MICROBIOLOGY DEPARTMENT WITHIN ONE WEEK IF SPECIATION AND SENSITIVITIES ARE REQUIRED.     Note: Gram Stain Report Called to,Read Back By and Verified With: KELLY CORUM 11/21/13 @ 1:27PM BY RUSCOE A.     Performed at [facility]   Report Status 11/22/2013 FINAL   Final  URINE CULTURE     Status: None   Collection Time    11/20/13 10:07 AM      Result Value Range Status   Specimen Description URINE, CLEAN CATCH   Final   Special Requests Normal   Final   Culture  Setup Time     Final   Value: 11/20/2013 15:11     Performed at [place]     Final   Value: 30,000 COLONIES/ML     Performed at [facility]   Culture     Final   Value: Multiple bacterial morphotypes present, none predominant. Suggest appropriate recollection if clinically indicated.     Performed at [facility]   Report Status 11/21/2013 FINAL   Final  AFB CULTURE WITH SMEAR     Status: None   Collection Time  11/22/13 11:18 AM      Result Value Range Status   Specimen Description ABSCESS BACK   Final   Special Requests T 10 T 11   Final   ACID FAST SMEAR     Final   Value: NO ACID FAST BACILLI SEEN     Performed at [facility]   Culture     Final   Value: CULTURE WILL BE EXAMINED FOR 6 WEEKS BEFORE ISSUING A FINAL REPORT     Performed at [facility]   Report Status PENDING   Incomplete  ANAEROBIC CULTURE     Status: None   Collection Time    11/22/13 11:18 AM      Result Value Range Status   Specimen Description ABSCESS BACK   Final   Special Requests T 10 T 11   Final   Gram Stain     Final   Value: NO WBC SEEN     NO SQUAMOUS EPITHELIAL CELLS SEEN     NO ORGANISMS SEEN     Performed at [facility]   Culture     Final   Value: NO ANAEROBES ISOLATED; CULTURE IN PROGRESS FOR 5 DAYS     Performed at [facility]   Report Status PENDING   Incomplete  FUNGUS CULTURE  W SMEAR     Status: None   Collection Time    11/22/13 11:18 AM      Result Value Range Status   Specimen Description ABSCESS BACK   Final   Special Requests T 10 T 11   Final   Fungal Smear     Final   Value: NO YEAST OR FUNGAL ELEMENTS SEEN     Performed at [facility]   Culture     Final   Value: CULTURE IN PROGRESS FOR FOUR WEEKS     Performed at [facility]   Report Status PENDING   Incomplete  CULTURE, ROUTINE-ABSCESS     Status: None   Collection Time    11/22/13 11:19 AM      Result Value Range Status   Specimen Description ABSCESS BACK   Final   Special Requests T 10 T 11   Final   Gram Stain     Final   Value: RARE WBC PRESENT,BOTH PMN AND MONONUCLEAR     NO SQUAMOUS EPITHELIAL CELLS SEEN     NO ORGANISMS SEEN     Performed at [facility]   Culture     Final   Value: NO GROWTH 3 DAYS     Performed at [facility]   Report Status 11/25/2013 FINAL   Final  CULTURE, BLOOD (ROUTINE X 2)     Status: None   Collection Time    11/23/13  2:55 PM      Result Value Range Status   Specimen Description BLOOD LEFT HAND   Final   Special Requests BOTTLES DRAWN AEROBIC ONLY 10CC   Final   Culture  Setup Time     Final   Value: 11/23/2013 20:12     Performed at [facility]   Culture     Final   Value:        BLOOD CULTURE RECEIVED NO GROWTH TO DATE CULTURE WILL BE HELD FOR 5 DAYS BEFORE ISSUING A FINAL NEGATIVE REPORT     Performed at [facility]   Report Status PENDING   Incomplete  CULTURE, BLOOD (ROUTINE X 2)  Status: None   Collection Time    11/23/13  3:05 PM      Result Value Range Status   Specimen Description BLOOD LEFT HAND   Final   Special Requests BOTTLES DRAWN AEROBIC ONLY 5CC   Final   Culture  Setup Time     Final   Value: 11/23/2013 20:10     Performed at [facility]   Culture     Final   Value:        BLOOD CULTURE RECEIVED NO GROWTH TO DATE CULTURE WILL BE HELD FOR 5 DAYS BEFORE ISSUING A  FINAL NEGATIVE REPORT     Performed at [facility]   Report Status PENDING   Incomplete    Anti-infectives   Start     Dose/Rate Route Frequency Ordered Stop   11/26/13 0000  dextrose 5 % SOLN 50 mL with cefTRIAXone 2 G SOLR 2 g  Status:  Discontinued     2 g 100 mL/hr over 30 Minutes Intravenous Every 24 hours 11/26/13 0921 11/26/13    11/26/13 0000  sodium chloride 0.9 % SOLN 500 mL with vancomycin 10 G SOLR 1,500 mg  Status:  Discontinued     1,500 mg 250 mL/hr over 120 Minutes Intravenous Every 24 hours 11/26/13 0921 11/26/13    11/26/13 0000  dextrose 5 % SOLN 50 mL with cefTRIAXone 2 G SOLR 2 g     2 g 100 mL/hr over 30 Minutes Intravenous Every 24 hours 11/26/13 0924     11/26/13 0000  sodium chloride 0.9 % SOLN 500 mL with vancomycin 10 G SOLR 1,500 mg     1,500 mg 250 mL/hr over 120 Minutes Intravenous Every 24 hours 11/26/13 0924     11/25/13 1100  vancomycin (VANCOCIN) 1,500 mg in sodium chloride 0.9 % 500 mL IVPB     1,500 mg 250 mL/hr over 120 Minutes Intravenous Every 24 hours 11/25/13 1001     11/23/13 1100  vancomycin (VANCOCIN) 1,500 mg in sodium chloride 0.9 % 500 mL IVPB  Status:  Discontinued     1,500 mg 250 mL/hr over 120 Minutes Intravenous Every 48 hours 11/21/13 1634 11/25/13 1001   11/23/13 1000  vancomycin (VANCOCIN) 1,500 mg in sodium chloride 0.9 % 500 mL IVPB  Status:  Discontinued     1,500 mg 250 mL/hr over 120 Minutes Intravenous Every 48 hours 11/21/13 0950 11/21/13 1045   11/22/13 1400  cefTRIAXone (ROCEPHIN) 2 g in dextrose 5 % 50 mL IVPB     2 g 100 mL/hr over 30 Minutes Intravenous Every 24 hours 11/22/13 1214     11/22/13 0600  vancomycin (VANCOCIN) IVPB 1000 mg/200 mL premix  Status:  Discontinued     1,000 mg 200 mL/hr over 60 Minutes Intravenous Every 48 hours 11/20/13 0455 11/21/13 0950   11/21/13 1000  vancomycin (VANCOCIN) 2,000 mg in sodium chloride 0.9 % 500 mL IVPB     2,000 mg 250 mL/hr over 120 Minutes Intravenous STAT  11/21/13 0950 11/21/13 1235   11/20/13 0600  vancomycin (VANCOCIN) 1,500 mg in sodium chloride 0.9 % 500 mL IVPB  Status:  Discontinued     1,500 mg 250 mL/hr over 120 Minutes Intravenous  Once 11/20/13 0455 11/21/13 0949   11/20/13 0530  piperacillin-tazobactam (ZOSYN) IVPB 3.375 g  Status:  Discontinued     3.375 g 12.5 mL/hr over 240 Minutes Intravenous 3 times per day 11/20/13 0455 11/21/13 1045      Assessment: Pt  on Vancomycin Day #6, Rocephin Day #5 for osteo/discitis. Appears bld cx CONS may be contaminant - repeat bld cx neg so far. 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 Afeb. WBC wnl. Plan for 6 weeks IV Rocephin/Vanc per ID MD.  1/3 Vanco>> 1/3 VR <5 (vanc dose 1/2 not given) 1/2 Zosyn>>1/3 1/4 Rocephin>>  1/2 BC x 2>> 1/2 CONS  1/2 UCx: neg 1/4: Back abscess>>neg 1/5 Bld x 2>>ngtd   Nephrology: CKD 4 (baseline unknown). ARF -Scr 1.58 (1.93 pk). Lytes ok. UOP not charted accurately.  Goal of Therapy:  Vancomycin trough level 15-20 mcg/ml  Plan:  1) Continue Vancomycin at 1500 g IV q24h.  **Noted plan for 6 wks IV abx per ID MD, if pt is discharged today, will need trough at [facility] with 4th dose (ideally Saturday if able or at the lastest Monday as pt on borderline between q24 and q48h dosing).** 2) Will order vancomycin trough for tomorrow a.m. if pt still here to give [place] idea where she is (will be before 3rd dose so not quite at [facility] yet)  Sherlon Handing, PharmD, BCPS Clinical pharmacist, pager [phone] 11/26/2013,11:26 AM

## 2013-11-26 NOTE — Progress Notes (Signed)
Pt to be d/c today to [facility].   Pt and family agreeable. Confirmed plans with facility.  Plan transfer via EMS.    [facility]  4N 1[address]16;  [phone] Phone: [phone]

## 2013-11-26 NOTE — Progress Notes (Signed)
Patient discharged to [facility] via [facility], report given to nurse Windy Canny, all belongings sent with patient except her basket of flowers as transporter will not allow it.

## 2013-11-26 NOTE — Discharge Summary (Addendum)
Physician Discharge Summary  Patient ID: [name] [name] MRN: [id number] DOB/AGE: 1930/06/30 80 y.o.  Admit date: 11/19/2013 Discharge date: 11/26/2013  Primary Care Physician:  No PCP Per Patient  Discharge Diagnoses:    . Acute T10-T11 discitis with osteomyelitis  . acute Back pain . CKD (chronic kidney disease) stage 4, GFR 15-29 ml/min . HTN (hypertension) . GPC bacteremia possibly contaminant 1/2 blood culture positive . Hypokalemia  Consults: Infectious disease, Dr. Graylon Good   Recommendations for Outpatient Follow-up:  Please check a BMET, CBC, Vanco trough weekly and fax to facility PCP or [facility] clinic, Dr Esmond Camper Tommy Medal   Allergies:   Allergies  Allergen Reactions  . Motrin [Ibuprofen] Hives     Discharge Medications:   Medication List    STOP taking these medications       meloxicam 15 MG tablet  Commonly known as:  MOBIC     naproxen sodium 220 MG tablet  Commonly known as:  ANAPROX      TAKE these medications       carvedilol 25 MG tablet  Commonly known as:  COREG  Take 0.5 tablets (12.5 mg total) by mouth 2 (two) times daily with a meal. Start now     clobetasol cream 0.05 %  Commonly known as:  TEMOVATE  Apply 1 application topically 2 (two) times daily. Apply to affected area(s) on leg     dextrose 5 % SOLN 50 mL with cefTRIAXone 2 G SOLR 2 g  Inject 2 g into the vein daily. X 6 WEEKS, till 01/03/14     feeding supplement (GLUCERNA SHAKE) Liqd  Take 237 mLs by mouth 2 (two) times daily between meals.     Fish Oil 1200 MG Caps  Take 1 capsule by mouth 2 (two) times daily.     furosemide 40 MG tablet  Commonly known as:  LASIX  Take 0.5 tablets (20 mg total) by mouth daily.     hydrALAZINE 50 MG tablet  Commonly known as:  APRESOLINE  Take 1 tablet (50 mg total) by mouth 3 (three) times daily.     HYDROcodone-acetaminophen 5-325 MG per tablet  Commonly known as:  NORCO/VICODIN  Take 1-2 tablets by mouth every 6 (six) hours as needed for  moderate pain.     lidocaine 5 %  Commonly known as:  LIDODERM  Place 1 patch onto the skin daily. Remove & Discard patch within 12 hours or as directed by MD     multivitamin with minerals Tabs tablet  Take 1 tablet by mouth daily.     simvastatin 20 MG tablet  Commonly known as:  ZOCOR  Take 20 mg by mouth daily at 6 PM.     sodium chloride 0.9 % injection  10-40 mLs by Intracatheter route as needed (flush).     sodium chloride 0.9 % SOLN 500 mL with vancomycin 10 G SOLR 1,500 mg  Inject 1,500 mg into the vein daily. X 6 WEEKS, till 01/03/14     traMADol 50 MG tablet  Commonly known as:  ULTRAM  Take 1 tablet (50 mg total) by mouth every 6 (six) hours as needed for moderate pain.     vitamin C 500 MG tablet  Commonly known as:  ASCORBIC ACID  Take 500 mg by mouth daily.         Brief H and P: For complete details please refer to admission H and P, but in brief Patient is an 80 year old female with history of  hypertension, hypercholesterolemia, diabetes and gout who presented with back pain after a fall that occurred several hours prior to admission. Pt described pain as persistent and sharp, 10/10 in severity, worse with movement and no specific alleviating symptoms. NO specific radiating symptom but it does involve almost entire back area. She was with family trying to get into a recliner chair when she was sitting too close to the edge and slid down from the chair. Per family, pt had three other falls at home this past year and has been progressively weaker. Pt denied chest pain, shortness of breath, no specific abdominal or urinary concerns. [facility] asked to admit for further evaluation given findings on XRAY imaging of possible discitis vs osteomyelitis.   Hospital Course:  Patient is 80 year old female who was admitted with back pain, found to have evidence of discitis/osteomyelitis on MRI at the T10 and T11 levels. Infectious disease, interventional radiology and neurosurgery  was consulted. Per neurosurgery, no surgical intervention was needed but recommended aspiration of the tissue/fluid for culture and medical management for chronic thoracic osteomyelitis Back pain/T10-T11 discitis/osteo +/- bacteremia  -MRI of thoracic spine with severe chronic-appearing T10-T11 discitis osteomyelitis with destruction of the T10 and T11 vertebral bodies, extension of the dorsal epidural without discrete abscess. Moderate canal stenosis at T10-11 with severe neural foramina all narrowing. Infectious disease consult was obtained. Neurosurgery was also consulted but Dr Saintclair Halsted did not think she needed any surgical intervention  -Patient was started on empiric antibiotics on 1/1 but following consultation with ID with a discussion of the above MRI results Dr. Baxter Flattery recommended to discontinue antibiotics and recommended to do disc aspiration to send for cultures.   -Following the above lab called with preliminary blood cultures showing 1/2 bottles with GPC in clusters. Patient is currently on IV vancomycin and ceftriaxone, daily for 6 weeks. Repeat blood cultures has been negative so far.  Abnormal urinalysis  -Urine cultures only with multiple bacterial morphotypes present, none predominant   CKD (chronic kidney disease) stage 4, GFR 15-29 ml/min  -Baseline creatinine unknown. Please follow BMET and vanc trough weekly   HTN (hypertension), uncontrolled  -Continue Coreg and hydralazine, titrate according to BP readings.  Bacteremia GPC in clusters -1/2  - cont vancomycin at this time. Per ID, not sure, if true pathogen in bacteremia as only 1/2 sets positive with coagulase-negative staph, appears to be contaminant. Repeat blood culture has been negative.    Day of Discharge BP 150/50  Pulse 101  Temp(Src) 98.3 F (36.8 C) (Oral)  Resp 18  Ht 5\' 2"  (1.575 m)  Wt 103.6 kg (228 lb 6.3 oz)  BMI 41.76 kg/m2  SpO2 95%  Physical Exam: General: Alert and awake oriented x3 not in any  acute distress. CVS: S1-S2 clear no murmur rubs or gallops Chest: clear to auscultation bilaterally, no wheezing rales or rhonchi Abdomen: soft nontender, nondistended, normal bowel sounds Extremities: no cyanosis, clubbing or edema noted bilaterally Neuro: Cranial nerves II-XII intact, no focal neurological deficits   The results of significant diagnostics from this hospitalization (including imaging, microbiology, ancillary and laboratory) are listed below for reference.    LAB RESULTS: Basic Metabolic Panel:  Recent Labs Lab 11/25/13 0535 11/26/13 0525  NA 140 143  K 4.1 4.2  CL 106 106  CO2 21 24  GLUCOSE 125* 111*  BUN 20 20  CREATININE 1.48* 1.58*  CALCIUM 10.2 10.5   Liver Function Tests: No results found for this basename: AST, ALT, ALKPHOS, BILITOT, PROT, ALBUMIN,  in the last 168 hours No results found for this basename: LIPASE, AMYLASE,  in the last 168 hours No results found for this basename: AMMONIA,  in the last 168 hours CBC:  Recent Labs Lab 11/20/13 0130  11/25/13 0535 11/26/13 0525  WBC 8.7  < > 7.6 8.7  NEUTROABS 4.8  --   --   --   HGB 12.1  < > 10.8* 10.6*  HCT 37.3  < > 34.1* 33.8*  MCV 86.1  < > 86.3 88.0  PLT 202  < > 178 186  < > = values in this interval not displayed. Cardiac Enzymes: No results found for this basename: CKTOTAL, CKMB, CKMBINDEX, TROPONINI,  in the last 168 hours BNP: No components found with this basename: POCBNP,  CBG:  Recent Labs Lab 11/25/13 2144 11/26/13 0802  GLUCAP 129* 112*    Significant Diagnostic Studies:  Dg Thoracic Spine W/swimmers  11/20/2013   CLINICAL DATA:  History of fall yesterday.  Difficulty moving.  EXAM: THORACIC SPINE - 2 VIEW + SWIMMERS  COMPARISON:  No priors.  FINDINGS: Irregularity of the inferior endplate of QA348G and superior endplate of 624THL is highly irregular and concerning for discitis/osteomyelitis. There is some compression of both of these vertebral bodies with approximately 50%  loss of height at both levels, and mild acute kyphotic deformity centered at T10-T11. There is mild multilevel degenerative disc disease, including what appears to be complete bony fusion between T8 and T9. Incidentally imaged portions of the thorax demonstrate diffuse interstitial prominence throughout the lungs bilaterally, most pronounced in the periphery of the lungs, concerning for the underlying interstitial lung disease.  IMPRESSION: 1. Highly unusual appearance of T10 and T11, as above, which is concern for potential discitis/osteomyelitis. Given the patient's history of recent fall, these findings could simply be related to acute trauma, however, the marked irregularity of the endplates at this level raises concern for infection. Correlation with MRI of the thoracic spine with and without IV gadolinium is recommended. 2. The appearance of the lung parenchyma suggests an underlying interstitial lung disease. This could be better evaluated with followup nonemergent high-resolution chest CT if clinically appropriate. These results were called by telephone at the time of interpretation on 11/20/2013 at 12:23 AM toPETER DAMMEN, PA, who verbally acknowledged these results.   Electronically Signed   By: Vinnie Langton M.D.   On: 11/20/2013 00:25   Dg Lumbar Spine Complete  11/20/2013   CLINICAL DATA:  History of fall complaining of difficulty moving.  EXAM: LUMBAR SPINE - COMPLETE 4+ VIEW  COMPARISON:  No priors.  FINDINGS: Five views of the lumbar spine demonstrate no definite acute displaced fracture or compression type fracture in the lumbar spine. Alignment is anatomic. Mild multilevel degenerative disc disease.  Incidentally imaged portions of the lower thoracic spine again demonstrate marked irregularity of T10 and T11 where there is evidence of bony destruction of the inferior endplate of QA348G and superior endplate of 624THL with widening of the intervening disc space, highly concerning for discitis and  osteomyelitis. At both of these levels there is significant loss of vertebral body height (approximately 50%).  IMPRESSION: 1. The appearance of T10 and T11 remains concerning for potential discitis/osteomyelitis, and correlation with MRI of the thoracic spine with and without IV gadolinium is strongly recommended. 2. No acute radiographic abnormality of the lumbar spine itself.   Electronically Signed   By: Vinnie Langton M.D.   On: 11/20/2013 00:28   Mr Lumbar Spine Wo  Contrast  11/20/2013   CLINICAL DATA:  Back pain, evaluate for discitis.  EXAM: MRI LUMBAR SPINE WITHOUT CONTRAST  TECHNIQUE: Multiplanar, multisequence MR imaging was performed. No intravenous contrast was administered (low GFR of 30, stage 4 kidney disease).  COMPARISON:  Lumbar spine radiograph November 19, 2013  FINDINGS: Lumbar vertebral bodies and posterior elements are intact and aligned with maintenance of lumbar lordosis. Intervertebral discs demonstrate normal morphology, decreased T2 signal within all disc most consistent mild desiccation. Minimal STIR signal within the L5-S1 facets favoring reactive changes associated with small effusions. No suspicious STIR signal within the vertebral bodies or discs.  Included view of the thoracic spine demonstrates T11-12 broad-based disc bulge fluid signal within the disc common destructive process of the T11 vertebral body incompletely characterized. Conus medullaris terminates at L1-2 and appears normal in morphology and signal characteristics. Cauda equina is nonsuspicious. Multiple T2 hyperintensities arising from the kidneys may reflect cyst. Multiple small low signal lesions throughout the uterus most consistent with leiomyomata. Partially imaged diverticulosis.  Level by level evaluation:  L1-2: Minimal annular bulging without canal stenosis or neural foraminal narrowing.  L2-3: Small bilateral far lateral disc protrusions. Mild facet arthropathy and ligamentum flavum redundancy. Minimal  canal stenosis. Mild bilateral neural foraminal narrowing.  L3-4: 4 mm broad-based disc bulge. Moderate facet arthropathy and ligamentum flavum redundancy with bilateral facet effusions measuring up to 3 mm on the right. Moderate canal stenosis, AP dimension of the thecal sac is 7 mm. Mild to moderate right, mild left neural foraminal narrowing.  L4-5: 2 mm broad-based disc bulge is superimposed small left far lateral disc protrusion and annular tear. Mild to moderate facet arthropathy and ligamentum flavum redundancy with trace facet effusions which are likely reactive. Mild canal stenosis. Mild bilateral neural foraminal narrowing.  L5-S1 4 mm broad-based disc bulge asymmetric to the right may encroach upon the exited right L5 nerve. Moderate to severe facet arthropathy and ligamentum flavum redundancy with trace facet effusions which are likely reactive. No canal stenosis. Severe right, moderate to severe left neural foraminal narrowing.  IMPRESSION: Abnormal destructive changes of the T10-11 disc, please see dedicated MRI of the thoracic spine from same day, reported separately for further description. No MR findings of discitis osteomyelitis in the lumbar spine.  Degenerative change of the lumbar spine: Moderate canal stenosis at L3-4, mild at L4-5.  Neural foraminal narrowing L2-3 to L5-S1: Severe on the right at L5-S1.  Mildly widened facets at L3-4, which can be associated with dynamic instability and could be further characterize with flexion and extension radiographs if clinically indicated.  Critical Value/emergent results were called by telephone at the time of interpretation on 11/20/2013 at 7:57 PM to Dr. Kathline Magic, who verbally acknowledged these results.   Electronically Signed   By: Elon Alas   On: 11/20/2013 19:58   Mr Thoracic Spine W Wo Contrast  11/20/2013   ADDENDUM REPORT: 11/20/2013 19:58  ADDENDUM: Critical Value/emergent results were called by telephone at the time of  interpretation on 11/20/2013 at 7:57 PM to Dr. Kathline Magic, who verbally acknowledged these results.   Electronically Signed   By: Elon Alas   On: 11/20/2013 19:58   11/20/2013   CLINICAL DATA:  Pain, rule out discitis.  EXAM: MRI THORACIC SPINE WITHOUT AND WITH CONTRAST  TECHNIQUE: Multiplanar and multiecho pulse sequences of the thoracic spine were obtained without and with intravenous contrast.  CONTRAST:  None: Due to low GFR (30) contrast was not administered.  COMPARISON:  Thoracic radiographs November 19, 2013  FINDINGS: Low T1, bright T2 signal intradiscal mass at T10-11 with complete destruction of the T10 body and inferior endplate, T11 body at and superior endplate. Abnormal fluid/phlegmon extends of the prevertebral soft tissues, right pedicle and posterior elements. 4 mm retropulsed disc material and bony fragments, in addition to facet arthropathy results in moderate canal stenosis at T10-11, AP dimension of the canal is 7 mm. Severe T10-11 neural foraminal narrowing. In addition, severe T11-12 disc height loss with fluid signal within the T11-12 disc associated with moderate subacute to chronic discogenic endplate changes and moderate (4 mm) broad-based disc bulge resulting in mild canal stenosis. Moderate to severe right, moderate left T11-12 neural foraminal narrowing.  The remaining thoracic vertebral bodies and posterior elements are intact and aligned. T8-9 disc height loss, with bridging bone marrow signal suggests auto interbody arthrodesis. Mild discogenic endplate changes of the upper thoracic levels. Scattered chronic Schmorl's nodes.  Thoracic spinal cord deformity at T10-11 due to canal stenosis, with no abnormal cord signal ; conus medullaris terminates at L1. Minimal bright interstitial surgeries STIR signal at T10-11 consistent with grade 1 strain.  Small broad-based disc bulge at T7-8 results in mild canal stenosis.  Small bilateral pleural effusions.  IMPRESSION: Severe chronic  appearing T10-11 discitis osteomyelitis with destruction of the T10 and T11 vertebral bodies, extension of this dorsal epidural space without discrete abscess. Moderate canal stenosis at T10-11 with severe neural foraminal narrowing.  Abnormal signal within T11 disc concerning for early discitis without osteomyelitis. Degenerative change at this level resulting in mild canal stenosis and moderate to severe right, moderate left neural foraminal narrowing.  Grade 1 paraspinal muscle strain T10-11.  Electronically Signed: By: Elon Alas On: 11/20/2013 19:06     Disposition and Follow-up: Discharge Orders   Future Appointments Provider Department Dept Phone   12/09/2013 2:00 PM Truman Hayward, MD [facility] for Infectious Disease [phone]   Future Orders Complete By Expires   Diet Carb Modified  As directed    Increase activity slowly  As directed        DISPOSITION: Skilled nursing facility  DIET: Carb modified diet  TESTS THAT NEED FOLLOW-UP CBC, BMET, Vanco trough weekly  DISCHARGE FOLLOW-UP Follow-up Information   Follow up with Alcide Evener, MD On 12/09/2013. (at 2:00PM)    Specialty:  Infectious Diseases   Contact information:   [address] [place] [address] [phone]       Time spent on Discharge: 45 minutes  Signed:   RAI,RIPUDEEP M.D. [facility] 11/26/2013, 9:24 AM Pager: [phone]

## 2013-11-26 NOTE — Progress Notes (Signed)
NUTRITION FOLLOW UP  Intervention:   Continue Glucerna once daily Provide Ensure Pudding and Magic Cup once daily Continue Multivitamin Encourage PO intake  Nutrition Dx:   Inadequate oral intake related to decreased appetite as evidenced by 12% weight loss in 8 months per pt report.   Goal:   Pt to meet >/= 90% of their estimated nutrition needs; not met  Monitor:   PO intake: <50% of most meals Weight; no new wt Labs; blood glucose 112 to 145 mg/dL, low GFR, elevated creatinine, low hemoglobin  Assessment:   80-year-old female with history of hypertension, hypercholesterolemia, diabetes and gout who presents with back pain after a fall that occurred several hours PTA. Per family, pt had three other falls at home this past year and has been progressively weaker.  Pt reports ongoing poor appetite. She reports eating 50% of 3 meals daily but, family at bedside report pt is eating less. Glucerna Shakes ordered BID; pt states she is drinking one daily. Pt states she is discharging to rehab soon. Encouraged increased PO intake to aid recovery and improve strength. Encouraged snacking in between meals and continued intake of nutritional supplements.   Height: Ht Readings from Last 1 Encounters:  11/20/13 5' 2" (1.575 m)    Weight Status:   Wt Readings from Last 1 Encounters:  11/20/13 228 lb 6.3 oz (103.6 kg)    Re-estimated needs:  Kcal: 1700-1860  Protein: 80-90 grams  Fluid: > 1.9 L/day  Skin: skin tear on left lower pretibial   Diet Order: Carb Control  No intake or output data in the 24 hours ending 11/26/13 1124  Last BM: 1/6   Labs:   Recent Labs Lab 11/22/13 0550 11/25/13 0535 11/26/13 0525  NA 144 140 143  K 4.0 4.1 4.2  CL 108 106 106  CO2 25 21 24  BUN 29* 20 20  CREATININE 1.93* 1.48* 1.58*  CALCIUM 9.9 10.2 10.5  GLUCOSE 111* 125* 111*    CBG (last 3)   Recent Labs  11/25/13 1612 11/25/13 2144 11/26/13 0802  GLUCAP 139* 129* 112*     Scheduled Meds: . carvedilol  25 mg Oral BID WC  . cefTRIAXone (ROCEPHIN)  IV  2 g Intravenous Q24H  . clobetasol cream  1 application Topical BID  . enoxaparin (LOVENOX) injection  40 mg Subcutaneous Daily  . feeding supplement (GLUCERNA SHAKE)  237 mL Oral BID BM  . hydrALAZINE  50 mg Oral TID  . insulin aspart  0-5 Units Subcutaneous QHS  . insulin aspart  0-9 Units Subcutaneous TID WC  . multivitamin with minerals  1 tablet Oral Daily  . simvastatin  20 mg Oral q1800  . sodium chloride  10-40 mL Intracatheter Q12H  . vancomycin  1,500 mg Intravenous Q24H  . vitamin C  500 mg Oral Daily    Continuous Infusions:    Barnett RD, LDN Inpatient Clinical Dietitian Pager: [phone] After Hours Pager: [phone]  

## 2013-11-27 ENCOUNTER — Non-Acute Institutional Stay (SKILLED_NURSING_FACILITY): Payer: Medicare HMO | Admitting: Adult Health

## 2013-11-27 DIAGNOSIS — M519 Unspecified thoracic, thoracolumbar and lumbosacral intervertebral disc disorder: Secondary | ICD-10-CM

## 2013-11-27 DIAGNOSIS — I10 Essential (primary) hypertension: Secondary | ICD-10-CM

## 2013-11-27 DIAGNOSIS — M4644 Discitis, unspecified, thoracic region: Secondary | ICD-10-CM

## 2013-11-27 DIAGNOSIS — R609 Edema, unspecified: Secondary | ICD-10-CM

## 2013-11-27 DIAGNOSIS — M869 Osteomyelitis, unspecified: Secondary | ICD-10-CM

## 2013-11-27 DIAGNOSIS — E785 Hyperlipidemia, unspecified: Secondary | ICD-10-CM

## 2013-11-27 DIAGNOSIS — M4624 Osteomyelitis of vertebra, thoracic region: Secondary | ICD-10-CM

## 2013-11-27 DIAGNOSIS — N184 Chronic kidney disease, stage 4 (severe): Secondary | ICD-10-CM

## 2013-11-27 DIAGNOSIS — M549 Dorsalgia, unspecified: Secondary | ICD-10-CM

## 2013-11-27 LAB — ANAEROBIC CULTURE: Gram Stain: NONE SEEN

## 2013-11-27 LAB — GLUCOSE, CAPILLARY: Glucose-Capillary: 194 mg/dL — ABNORMAL HIGH (ref 70–99)

## 2013-11-29 ENCOUNTER — Encounter: Payer: Self-pay | Admitting: Adult Health

## 2013-11-29 DIAGNOSIS — M4644 Discitis, unspecified, thoracic region: Secondary | ICD-10-CM

## 2013-11-29 DIAGNOSIS — R609 Edema, unspecified: Secondary | ICD-10-CM | POA: Insufficient documentation

## 2013-11-29 DIAGNOSIS — M4624 Osteomyelitis of vertebra, thoracic region: Secondary | ICD-10-CM | POA: Insufficient documentation

## 2013-11-29 DIAGNOSIS — E785 Hyperlipidemia, unspecified: Secondary | ICD-10-CM | POA: Insufficient documentation

## 2013-11-29 HISTORY — DX: Discitis, unspecified, thoracic region: M46.44

## 2013-11-29 HISTORY — DX: Osteomyelitis of vertebra, thoracic region: M46.24

## 2013-11-29 LAB — CULTURE, BLOOD (ROUTINE X 2)
CULTURE: NO GROWTH
Culture: NO GROWTH

## 2013-11-29 NOTE — Progress Notes (Signed)
Patient ID: [name] [name], female   DOB: Dec 18, 1929, 80 y.o.   MRN: [id number]     [facility]  Allergies  Allergen Reactions  . Motrin [Ibuprofen] Hives     Chief Complaint  Patient presents with  . Hospitalization Follow-up    HPI:  She has been hospitalized after sustaining a fall at home and developing back pain. She was found to have T10-T11 discitis and osteomyelitis. She is here for short term rehab; IV abt management. Her goal is to return back home. She is complaining of having a poor appetite. At this time she would like to continue with her supplements; and will monitor her; at this time neither she nor her family want an appetite stimulant.    Past Medical History  Diagnosis Date  . Hypertension   . Gout   . Hypercholesteremia   . Diabetes mellitus     Past Surgical History  Procedure Laterality Date  . Cesarean section    . Rotator cuff repair    . Tonsillectomy    . Tubal ligation      VITAL SIGNS BP 138/78  Pulse 90  Ht 5\' 2"  (1.575 m)  Wt 228 lb (103.42 kg)  BMI 41.69 kg/m2   Patient's Medications  New Prescriptions   No medications on file  Previous Medications   CARVEDILOL (COREG) 25 MG TABLET    Take 0.5 tablets (12.5 mg total) by mouth 2 (two) times daily with a meal. Start now   CLOBETASOL CREAM (TEMOVATE) 0.05 %    Apply 1 application topically 2 (two) times daily. Apply to affected area(s) on leg   DEXTROSE 5 % SOLN 50 ML WITH CEFTRIAXONE 2 G SOLR 2 G    Inject 2 g into the vein daily. X 6 WEEKS, till 01/03/14   FEEDING SUPPLEMENT, GLUCERNA SHAKE, (GLUCERNA SHAKE) LIQD    Take 237 mLs by mouth 2 (two) times daily between meals.   FUROSEMIDE (LASIX) 40 MG TABLET    Take 0.5 tablets (20 mg total) by mouth daily.   HYDRALAZINE (APRESOLINE) 50 MG TABLET    Take 1 tablet (50 mg total) by mouth 3 (three) times daily.   HYDROCODONE-ACETAMINOPHEN (NORCO/VICODIN) 5-325 MG PER TABLET    Take 1-2 tablets by mouth every 6 (six) hours as needed for  moderate pain.   LIDOCAINE (LIDODERM) 5 %    Place 1 patch onto the skin daily. Remove & Discard patch within 12 hours or as directed by MD   MULTIPLE VITAMIN (MULTIVITAMIN WITH MINERALS) TABS TABLET    Take 1 tablet by mouth daily.   OMEGA-3 FATTY ACIDS (FISH OIL) 1200 MG CAPS    Take 1 capsule by mouth 2 (two) times daily.   SIMVASTATIN (ZOCOR) 20 MG TABLET    Take 20 mg by mouth daily at 6 PM.   SODIUM CHLORIDE 0.9 % INJECTION    10-40 mLs by Intracatheter route as needed (flush).   SODIUM CHLORIDE 0.9 % SOLN 500 ML WITH VANCOMYCIN 10 G SOLR 1,500 MG    Inject 1,500 mg into the vein daily. X 6 WEEKS, till 01/03/14   TRAMADOL (ULTRAM) 50 MG TABLET    Take 1 tablet (50 mg total) by mouth every 6 (six) hours as needed for moderate pain.   VITAMIN C (ASCORBIC ACID) 500 MG TABLET    Take 500 mg by mouth daily.  Modified Medications   No medications on file  Discontinued Medications   No medications on file  SIGNIFICANT DIAGNOSTIC EXAMS  11-20-13: thoracic spine x-ray: 1. Highly unusual appearance of T10 and T11, as above, which is concern for potential discitis/osteomyelitis. Given the patient's history of recent fall, these findings could simply be related to acute trauma, however, the marked irregularity of the endplates at this level raises concern for infection. Correlation with MRI of the thoracic spine with and without IV gadolinium is recommended. 2. The appearance of the lung parenchyma suggests an underlying interstitial lung disease. This could be better evaluated with followup nonemergent high-resolution chest CT if clinicallyappropriate.  11-20-13: lumbar spine x-ray: 1. The appearance of T10 and T11 remains concerning for potential discitis/osteomyelitis, and correlation with MRI of the thoracic spine with and without IV gadolinium is strongly recommended.2. No acute radiographic abnormality of the lumbar spine itself.  11-20-13: mri lumbar spine: Abnormal destructive changes of the  T10-11 disc,  No MR findings of discitis osteomyelitis in the lumbar spine. Degenerative change of the lumbar spine: Moderate canal stenosis at L3-4, mild at L4-5. Neural foraminal narrowing L2-3 to L5-S1: Severe on the right at L5-S1. Mildly widened facets at L3-4, which can be associated with dynamic instability and could be further characterize with flexion and extension radiographs if clinically indicated.  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 Moderate canal stenosis at T10-11 with severe neural foraminal narrowing. Abnormal signal within T11 disc concerning for early discitis without osteomyelitis. Degenerative change at this level resulting in mild canal stenosis and moderate to severe right, moderate left neural foraminal narrowing. Grade 1 paraspinal muscle strain T10-11.    11-25-13: chest x-ray: 1. Right upper extremity PICC with the tip in the right atrium. This could be retracted 2 cm for positioning at the junction of the superior vena cava and right atrium. 2. Diffuse reticular pattern in the lungs suggesting chronic interstitial lung disease. Other differential considerations discussed above. 3. Borderline cardiomegaly.   LABS REVIEWED:   11-20-13: wbc 8.7; hgb 12.1; hct 37.3; mcv 86.1plt 202; glucose 125; bun 32; creat 1.85; k+3.9; na++144; ca++ 10.8; sed rate 27; BNP 894.4; hgb a1c 6.8; tsh 1.225 11-25-13: wbc 7.6; hgb 10.8; hct 34.1; mcv 86.3;plt 178; glucose 125; bun 20; creat 1.48; k+4.1; na++140; ca++ 10.2 BNP 1142       Review of Systems  Constitutional: Negative for malaise/fatigue.       Has poor appetite  Eyes: Negative for blurred vision.  Respiratory: Negative for cough and shortness of breath.   Cardiovascular: Negative for chest pain, palpitations and leg swelling.  Gastrointestinal: Negative for heartburn,  diarrhea and constipation.  Musculoskeletal: Positive for back pain and myalgias.       Her pain is being managed at this time.   Neurological: Negative for dizziness and headaches.  Psychiatric/Behavioral: Negative for depression. The patient is not nervous/anxious.     Physical Exam  Constitutional: She is oriented to person, place, and time. She appears well-developed and well-nourished. No distress.  obese  Neck: Neck supple. No JVD present. No thyromegaly present.  Cardiovascular: Normal rate, regular rhythm and intact distal pulses.   Respiratory: Effort normal. No respiratory distress. She has wheezes.  GI: Soft. Bowel sounds are normal. She exhibits no distension. There is no tenderness.  Musculoskeletal: She exhibits no edema.  Is able to move all extremities; has mild lower extremity weakness present.   Neurological: She is alert and oriented to person,  place, and time.  Skin: Skin is warm and dry. She is not diaphoretic.  Psychiatric: She has a normal mood and affect.     ASSESSMENT/ PLAN:  1. T10-T11 discitis with osteomyelitis: will continue her ceftriaxone 2 mg daily and vancomycin iv daily through 01-03-14. Will continue therapy as directed to improve upon her strengthening and gait training.   2. Back pain: her pain is being managed with lidoderm patch to her back on daily and off nightly; vicodin 5/325 mg 1 or 2 tabs every 6 hours as needed; ultram 50 mg every 6 hours as needed. Will continue therapy as directed and will monitor her status  3. Hypertension: is stable will continue apresoline 50 mg three times daily; coreg 12.5 mg twice daily and will monitor her status   4. Edema: is stable will continue lasix 20 mg daily   5. Dyslipidemia: will continue zocor 20 mg daily  6. Chronic renal disease stage IV: no change in her status will continue her current regimen; pharmacy will dose her vancomycin and will monitor her status   Time spent with patient 50 minutes.

## 2013-11-30 ENCOUNTER — Encounter: Payer: Self-pay | Admitting: Internal Medicine

## 2013-11-30 ENCOUNTER — Non-Acute Institutional Stay (SKILLED_NURSING_FACILITY): Payer: Medicare HMO | Admitting: Internal Medicine

## 2013-11-30 DIAGNOSIS — E785 Hyperlipidemia, unspecified: Secondary | ICD-10-CM

## 2013-11-30 DIAGNOSIS — M4624 Osteomyelitis of vertebra, thoracic region: Secondary | ICD-10-CM

## 2013-11-30 DIAGNOSIS — I10 Essential (primary) hypertension: Secondary | ICD-10-CM | POA: Insufficient documentation

## 2013-11-30 DIAGNOSIS — R5381 Other malaise: Secondary | ICD-10-CM

## 2013-11-30 DIAGNOSIS — M869 Osteomyelitis, unspecified: Secondary | ICD-10-CM

## 2013-11-30 DIAGNOSIS — R21 Rash and other nonspecific skin eruption: Secondary | ICD-10-CM

## 2013-11-30 DIAGNOSIS — M549 Dorsalgia, unspecified: Secondary | ICD-10-CM

## 2013-11-30 DIAGNOSIS — R531 Weakness: Secondary | ICD-10-CM

## 2013-11-30 DIAGNOSIS — R5383 Other fatigue: Secondary | ICD-10-CM

## 2013-11-30 HISTORY — DX: Weakness: R53.1

## 2013-11-30 NOTE — Progress Notes (Signed)
Patient ID: [name] [name], female   DOB: Dec 14, 1929, 80 y.o.   MRN: [id number]    [facility]    PCP: No PCP Per Patient  Code Status: full code  Allergies  Allergen Reactions  . Motrin [Ibuprofen] Hives    Chief Complaint: new admit  HPI:  80 y/o female patient is here for STR after hospital admission from 11/19/13- 11/26/13 with back pain post fall.MRI showed discitis/ osteomyelitis at T10-T11 level. ID, IR and neurosurgery were consulted and medical management was decided after sending CSF fluid for culture.  She was then started on vancomycin and ceftriaxone for total of 6 weeks.  She complaints of her back hurting and pain of 9/10 at present. Denies numbness , tingling or weakness of her extremities. Tolerating her antibiotics well. Has rash on her abdominal folds and has been started on nystatin powder in the facility. Has been able to work with therapy team some.  Review of Systems  Constitutional: Negative for fever, chills, weight loss, malaise/fatigue and diaphoresis.  HENT: Negative for congestion, hearing loss and sore throat.   Eyes: Negative for blurred vision, double vision and discharge.  Respiratory: Negative for cough, sputum production, shortness of breath and wheezing.   Cardiovascular: Negative for chest pain, palpitations, orthopnea and leg swelling.  Gastrointestinal: Negative for heartburn, nausea, vomiting, abdominal pain, diarrhea and constipation. last bowel movement yesterday Genitourinary: Negative for dysuria, urgency, frequency and flank pain.  Musculoskeletal: Negative for falls, joint pain and myalgias.  Skin: Negative for itching and rash.  Neurological: Positive for weakness. Negative for dizziness, tingling, focal weakness and headaches.  Psychiatric/Behavioral: Negative for depression and memory loss. The patient is not nervous/anxious.     Past Medical History  Diagnosis Date  . Hypertension   . Gout   . Hypercholesteremia   .  Diabetes mellitus    Past Surgical History  Procedure Laterality Date  . Cesarean section    . Rotator cuff repair    . Tonsillectomy    . Tubal ligation     Social History:   reports that she has quit smoking. She does not have any smokeless tobacco history on file. She reports that she does not drink alcohol or use illicit drugs.  Family History  Problem Relation Age of Onset  . Hypertension Mother   . Hypertension Father   . Hypertension Daughter     Medications: Patient's Medications  New Prescriptions   No medications on file  Previous Medications   CARVEDILOL (COREG) 25 MG TABLET    Take 0.5 tablets (12.5 mg total) by mouth 2 (two) times daily with a meal. Start now   CLOBETASOL CREAM (TEMOVATE) 0.05 %    Apply 1 application topically 2 (two) times daily. Apply to affected area(s) on leg   DEXTROSE 5 % SOLN 50 ML WITH CEFTRIAXONE 2 G SOLR 2 G    Inject 2 g into the vein daily. X 6 WEEKS, till 01/03/14   FEEDING SUPPLEMENT, GLUCERNA SHAKE, (GLUCERNA SHAKE) LIQD    Take 237 mLs by mouth 2 (two) times daily between meals.   FUROSEMIDE (LASIX) 40 MG TABLET    Take 0.5 tablets (20 mg total) by mouth daily.   HYDRALAZINE (APRESOLINE) 50 MG TABLET    Take 1 tablet (50 mg total) by mouth 3 (three) times daily.   HYDROCODONE-ACETAMINOPHEN (NORCO/VICODIN) 5-325 MG PER TABLET    Take 1-2 tablets by mouth every 6 (six) hours as needed for moderate pain.   LIDOCAINE (  LIDODERM) 5 %    Place 1 patch onto the skin daily. Remove & Discard patch within 12 hours or as directed by MD   MULTIPLE VITAMIN (MULTIVITAMIN WITH MINERALS) TABS TABLET    Take 1 tablet by mouth daily.   OMEGA-3 FATTY ACIDS (FISH OIL) 1200 MG CAPS    Take 1 capsule by mouth 2 (two) times daily.   SIMVASTATIN (ZOCOR) 20 MG TABLET    Take 20 mg by mouth daily at 6 PM.   SODIUM CHLORIDE 0.9 % INJECTION    10-40 mLs by Intracatheter route as needed (flush).   SODIUM CHLORIDE 0.9 % SOLN 500 ML WITH VANCOMYCIN 10 G SOLR 1,500 MG     Inject 1,500 mg into the vein daily. X 6 WEEKS, till 01/03/14   TRAMADOL (ULTRAM) 50 MG TABLET    Take 1 tablet (50 mg total) by mouth every 6 (six) hours as needed for moderate pain.   VITAMIN C (ASCORBIC ACID) 500 MG TABLET    Take 500 mg by mouth daily.  Modified Medications   No medications on file  Discontinued Medications   No medications on file     Physical Exam: Filed Vitals:   11/30/13 1410  BP: 195/89  Pulse: 88  Temp: 98.9 F (37.2 C)  Resp: 20  SpO2: 96%   General- Alert elderly female in discomfort, obese HEENT- MMM, PERRLA, no lymphadenopathy CVS- normal S1,S2 , no murmur rubs or gallops, no edema Chest- clear to auscultation bilaterally, no wheezing rales or rhonchi Abdomen-soft, non tender, normal bowel sounds Extremities- able to move all 4 extremities, weakness in lower extremity, midback discomfort, no spinal tenderness Neuro- no focal neurological deficits, alert and oriented Skin- rash on abdominal folds, dry. picc line in place and site clean   Labs reviewed: Basic Metabolic Panel:  Recent Labs  11/22/13 0550 11/25/13 0535 11/26/13 0525  NA 144 140 143  K 4.0 4.1 4.2  CL 108 106 106  CO2 25 21 24   GLUCOSE 111* 125* 111*  BUN 29* 20 20  CREATININE 1.93* 1.48* 1.58*  CALCIUM 9.9 10.2 10.5   CBC:  Recent Labs  11/20/13 0130  11/22/13 0550 11/25/13 0535 11/26/13 0525  WBC 8.7  < > 8.1 7.6 8.7  NEUTROABS 4.8  --   --   --   --   HGB 12.1  < > 10.7* 10.8* 10.6*  HCT 37.3  < > 34.1* 34.1* 33.8*  MCV 86.1  < > 87.0 86.3 88.0  PLT 202  < > 171 178 186  < > = values in this interval not displayed. CBG:  Recent Labs  11/25/13 2144 11/26/13 0802 11/26/13 1143  GLUCAP 129* 112* 149*    Radiological Exams: Significant Diagnostic Studies:   Dg Thoracic Spine W/swimmers  11/20/2013   CLINICAL DATA:  History of fall yesterday.  Difficulty moving.  EXAM: THORACIC SPINE - 2 VIEW + SWIMMERS  COMPARISON:  No priors.  FINDINGS: Irregularity  of the inferior endplate of Z36 and superior endplate of U44 is highly irregular and concerning for discitis/osteomyelitis. There is some compression of both of these vertebral bodies with approximately 50% loss of height at both levels, and mild acute kyphotic deformity centered at T10-T11. There is mild multilevel degenerative disc disease, including what appears to be complete bony fusion between T8 and T9. Incidentally imaged portions of the thorax demonstrate diffuse interstitial prominence throughout the lungs bilaterally, most pronounced in the periphery of the lungs, concerning for the underlying  interstitial lung disease.  IMPRESSION: 1. Highly unusual appearance of T10 and T11, as above, which is concern for potential discitis/osteomyelitis. Given the patient's history of recent fall, these findings could simply be related to acute trauma, however, the marked irregularity of the endplates at this level raises concern for infection. Correlation with MRI of the thoracic spine with and without IV gadolinium is recommended. 2. The appearance of the lung parenchyma suggests an underlying interstitial lung disease. This could be better evaluated with followup nonemergent high-resolution chest CT if clinically appropriate. These results were called by telephone at the time of interpretation on 11/20/2013 at 12:23 AM toPETER DAMMEN, PA, who verbally acknowledged these results.   Electronically Signed   By: Vinnie Langton M.D.   On: 11/20/2013 00:25   Dg Lumbar Spine Complete  11/20/2013   CLINICAL DATA:  History of fall complaining of difficulty moving.  EXAM: LUMBAR SPINE - COMPLETE 4+ VIEW  COMPARISON:  No priors.  FINDINGS: Five views of the lumbar spine demonstrate no definite acute displaced fracture or compression type fracture in the lumbar spine. Alignment is anatomic. Mild multilevel degenerative disc disease.  Incidentally imaged portions of the lower thoracic spine again demonstrate marked  irregularity of T10 and T11 where there is evidence of bony destruction of the inferior endplate of [id number] and superior endplate of [id number] with widening of the intervening disc space, highly concerning for discitis and osteomyelitis. At both of these levels there is significant loss of vertebral body height (approximately 50%).  IMPRESSION: 1. The appearance of T10 and T11 remains concerning for potential discitis/osteomyelitis, and correlation with MRI of the thoracic spine with and without IV gadolinium is strongly recommended. 2. No acute radiographic abnormality of the lumbar spine itself.   Electronically Signed   By: Vinnie Langton M.D.   On: 11/20/2013 00:28   Mr Lumbar Spine Wo Contrast  11/20/2013   CLINICAL DATA:  Back pain, evaluate for discitis.  EXAM: MRI LUMBAR SPINE WITHOUT CONTRAST  TECHNIQUE: Multiplanar, multisequence MR imaging was performed. No intravenous contrast was administered (low GFR of 30, stage 4 kidney disease).  COMPARISON:  Lumbar spine radiograph November 19, 2013  FINDINGS: Lumbar vertebral bodies and posterior elements are intact and aligned with maintenance of lumbar lordosis. Intervertebral discs demonstrate normal morphology, decreased T2 signal within all disc most consistent mild desiccation. Minimal STIR signal within the L5-S1 facets favoring reactive changes associated with small effusions. No suspicious STIR signal within the vertebral bodies or discs.  Included view of the thoracic spine demonstrates T11-12 broad-based disc bulge fluid signal within the disc common destructive process of the T11 vertebral body incompletely characterized. Conus medullaris terminates at L1-2 and appears normal in morphology and signal characteristics. Cauda equina is nonsuspicious. Multiple T2 hyperintensities arising from the kidneys may reflect cyst. Multiple small low signal lesions throughout the uterus most consistent with leiomyomata. Partially imaged diverticulosis.  Level by level  evaluation:  L1-2: Minimal annular bulging without canal stenosis or neural foraminal narrowing.  L2-3: Small bilateral far lateral disc protrusions. Mild facet arthropathy and ligamentum flavum redundancy. Minimal canal stenosis. Mild bilateral neural foraminal narrowing.  L3-4: 4 mm broad-based disc bulge. Moderate facet arthropathy and ligamentum flavum redundancy with bilateral facet effusions measuring up to 3 mm on the right. Moderate canal stenosis, AP dimension of the thecal sac is 7 mm. Mild to moderate right, mild left neural foraminal narrowing.  L4-5: 2 mm broad-based disc bulge is superimposed small left far lateral disc protrusion and  annular tear. Mild to moderate facet arthropathy and ligamentum flavum redundancy with trace facet effusions which are likely reactive. Mild canal stenosis. Mild bilateral neural foraminal narrowing.  L5-S1 4 mm broad-based disc bulge asymmetric to the right may encroach upon the exited right L5 nerve. Moderate to severe facet arthropathy and ligamentum flavum redundancy with trace facet effusions which are likely reactive. No canal stenosis. Severe right, moderate to severe left neural foraminal narrowing.  IMPRESSION: Abnormal destructive changes of the T10-11 disc, please see dedicated MRI of the thoracic spine from same day, reported separately for further description. No MR findings of discitis osteomyelitis in the lumbar spine.  Degenerative change of the lumbar spine: Moderate canal stenosis at L3-4, mild at L4-5. Neural foraminal narrowing L2-3 to L5-S1: Severe on the right at L5-S1.  Mildly widened facets at L3-4, which can be associated with dynamic instability and could be further characterize with flexion and extension radiographs if clinically indicated.  Critical Value/emergent results were called by telephone at the time of interpretation on 11/20/2013 at 7:57 PM to Dr. Kathline Magic, who verbally acknowledged these results.   Electronically Signed   By:  Elon Alas   On: 11/20/2013 19:58   Mr Thoracic Spine W Wo Contrast  11/20/2013   ADDENDUM REPORT: 11/20/2013 19:58  ADDENDUM: Critical Value/emergent results were called by telephone at the time of interpretation on 11/20/2013 at 7:57 PM to Dr. Kathline Magic, who verbally acknowledged these results.   Electronically Signed   By: Elon Alas   On: 11/20/2013 19:58   11/20/2013   CLINICAL DATA:  Pain, rule out discitis.  EXAM: MRI THORACIC SPINE WITHOUT AND WITH CONTRAST  TECHNIQUE: Multiplanar and multiecho pulse sequences of the thoracic spine were obtained without and with intravenous contrast.  CONTRAST:  None: Due to low GFR (30) contrast was not administered.  COMPARISON:  Thoracic radiographs November 19, 2013  FINDINGS: Low T1, bright T2 signal intradiscal mass at T10-11 with complete destruction of the T10 body and inferior endplate, T11 body at and superior endplate. Abnormal fluid/phlegmon extends of the prevertebral soft tissues, right pedicle and posterior elements. 4 mm retropulsed disc material and bony fragments, in addition to facet arthropathy results in moderate canal stenosis at T10-11, AP dimension of the canal is 7 mm. Severe T10-11 neural foraminal narrowing. In addition, severe T11-12 disc height loss with fluid signal within the T11-12 disc associated with moderate subacute to chronic discogenic endplate changes and moderate (4 mm) broad-based disc bulge resulting in mild canal stenosis. Moderate to severe right, moderate left T11-12 neural foraminal narrowing.  The remaining thoracic vertebral bodies and posterior elements are intact and aligned. T8-9 disc height loss, with bridging bone marrow signal suggests auto interbody arthrodesis. Mild discogenic endplate changes of the upper thoracic levels. Scattered chronic Schmorl's nodes.  Thoracic spinal cord deformity at T10-11 due to canal stenosis, with no abnormal cord signal ; conus medullaris terminates at L1. Minimal bright  interstitial surgeries STIR signal at T10-11 consistent with grade 1 strain.  Small broad-based disc bulge at T7-8 results in mild canal stenosis.  Small bilateral pleural effusions.  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 Moderate canal stenosis at T10-11 with severe neural foraminal narrowing.  Abnormal signal within T11 disc concerning for early discitis without osteomyelitis. Degenerative change at this level resulting in mild canal stenosis and moderate to severe right, moderate left  neural foraminal narrowing.  Grade 1 paraspinal muscle strain T10-11.  Electronically Signed: By: Elon Alas On: 11/20/2013 19:06    Assessment/Plan  Osteomyelitis Complete course of iv vancomycin and rocephin for total 6 weeks. To follow on vac trough and labs for renal dosing of her vancomycin. picc line skin care. Wbc and temp curve monitor.Will need to follow up with ID.  Candidal rash Continue nystatin powder and monitor clinically  Generalized weakness In setting of pain and infection. Will need to work with PT and OT for gait training and muscle strengthening  Back pain Worsened.  Will change her vicodin to 10-325 1-2 tab q6h prn pain. Will have her on ultram 50 mg q4h prn breakthrough pain. Reassess if no improvement. No neurological deficit at present  Hypertension Elevated blood pressure at present and on review of her facility bp readings have been elevated. On apresoline 50 mg three times daily and coreg 12.5 mg twice daily at present. Also on lasix 20 mg daily. Will change her hydralazine to 50 mg qid for now and monitor clinically. Check bp q shift. Explained to pt about warning signs with elevated bp readings.   Hyperlipidemia continue zocor 20 mg daily   Family/ staff Communication:  reviewed care plan with patient and nursing  supervisor   Goals of care: to return home after STR   Labs/tests ordered- weekly vancomycin trough, cbc, bmp

## 2013-12-01 ENCOUNTER — Other Ambulatory Visit: Payer: Self-pay | Admitting: *Deleted

## 2013-12-01 ENCOUNTER — Other Ambulatory Visit: Payer: Self-pay

## 2013-12-01 LAB — CREATININE, SERUM
Creatinine: 1.36 mg/dL — ABNORMAL HIGH (ref 0.60–1.30)
EGFR (Non-African Amer.): 36 — ABNORMAL LOW
GFR CALC AF AMER: 42 — AB

## 2013-12-01 LAB — VANCOMYCIN, TROUGH: Vancomycin, Trough: 25 ug/mL (ref 10–20)

## 2013-12-01 LAB — BUN: BUN: 15 mg/dL (ref 7–18)

## 2013-12-01 MED ORDER — HYDROCODONE-ACETAMINOPHEN 10-325 MG PO TABS: 1.0000 | ORAL_TABLET | Freq: Four times a day (QID) | ORAL | Status: DC | PRN

## 2013-12-01 MED ORDER — TRAMADOL HCL 50 MG PO TABS: 50.0000 mg | ORAL_TABLET | Freq: Four times a day (QID) | ORAL | Status: DC | PRN

## 2013-12-01 NOTE — Telephone Encounter (Signed)
rx filled per protocol  

## 2013-12-07 ENCOUNTER — Other Ambulatory Visit: Payer: Self-pay

## 2013-12-08 LAB — VANCOMYCIN, TROUGH: VANCOMYCIN, TROUGH: 17 ug/mL (ref 10–20)

## 2013-12-09 ENCOUNTER — Encounter: Payer: Self-pay | Admitting: Infectious Disease

## 2013-12-09 ENCOUNTER — Ambulatory Visit (INDEPENDENT_AMBULATORY_CARE_PROVIDER_SITE_OTHER): Payer: Medicare HMO | Admitting: Infectious Disease

## 2013-12-09 VITALS — BP 181/80 | HR 82 | Temp 98.1°F

## 2013-12-09 DIAGNOSIS — M869 Osteomyelitis, unspecified: Secondary | ICD-10-CM

## 2013-12-09 DIAGNOSIS — Z992 Dependence on renal dialysis: Secondary | ICD-10-CM

## 2013-12-09 DIAGNOSIS — M4640 Discitis, unspecified, site unspecified: Secondary | ICD-10-CM

## 2013-12-09 DIAGNOSIS — N186 End stage renal disease: Secondary | ICD-10-CM

## 2013-12-09 DIAGNOSIS — M519 Unspecified thoracic, thoracolumbar and lumbosacral intervertebral disc disorder: Secondary | ICD-10-CM

## 2013-12-09 DIAGNOSIS — E1151 Type 2 diabetes mellitus with diabetic peripheral angiopathy without gangrene: Secondary | ICD-10-CM

## 2013-12-09 DIAGNOSIS — E1159 Type 2 diabetes mellitus with other circulatory complications: Secondary | ICD-10-CM

## 2013-12-09 DIAGNOSIS — E1129 Type 2 diabetes mellitus with other diabetic kidney complication: Secondary | ICD-10-CM

## 2013-12-09 DIAGNOSIS — M4626 Osteomyelitis of vertebra, lumbar region: Secondary | ICD-10-CM

## 2013-12-09 NOTE — Progress Notes (Signed)
Subjective:    Patient ID: [name] [name], female    DOB: 10-22-1930, 80 y.o.   MRN: [id number]  HPI  80 y.o. female with DM, HTN, CKD 4 who presents with worsening back pain and recent falls found to have MRI findings concerning for T10-11 disckitis/osteo. She underwent IR guided biopsy of the lesion for cultures while she was Re: on antibiotics and cultures did not yield an organism. She had a coagulase-negative staphylococcal species isolated from one out of 2 blood cultures likely due to a contaminant.  She's been treated with vancomycin and ceftriaxone now nearing the second week of therapy.  She continues to have severe lower back pain although is slightly better than when she was in the hospital. Pain is there "all the time. She also has significant lower charity weakness and is unable to stand without assistance.  She had been seen by neurosurgery in the hospital I felt was the need for surgery at that time.  On closer talking in questioning of the patient and her grandson who accompanies her it appears that her lower from his strength is improving gradually her pain is improving improving gradually although she still appears fairly miserable. She is without fevers chills nausea or malaise. He does feel cold great deal of time.      Review of Systems  Constitutional: Negative for fever, chills, diaphoresis, activity change, appetite change, fatigue and unexpected weight change.  HENT: Negative for congestion, rhinorrhea, sinus pressure, sneezing, sore throat and trouble swallowing.   Eyes: Negative for photophobia and visual disturbance.  Respiratory: Negative for cough, chest tightness, shortness of breath, wheezing and stridor.   Gastrointestinal: Negative for nausea, vomiting, abdominal pain, diarrhea, constipation, blood in stool, abdominal distention and anal bleeding.  Genitourinary: Negative for dysuria, hematuria, flank pain and difficulty urinating.  Musculoskeletal:  Positive for arthralgias and back pain. Negative for gait problem, joint swelling and myalgias.  Skin: Negative for color change, pallor, rash and wound.  Neurological: Positive for weakness. Negative for dizziness, tremors and light-headedness.  Hematological: Negative for adenopathy. Does not bruise/bleed easily.  Psychiatric/Behavioral: Negative for behavioral problems, confusion, sleep disturbance, dysphoric mood, decreased concentration and agitation.       Objective:   Physical Exam  Constitutional: She is oriented to person, place, and time. She appears well-nourished. No distress.  HENT:  Head: Normocephalic and atraumatic.  Eyes: Conjunctivae and EOM are normal. No scleral icterus.  Neck: Normal range of motion. Neck supple.  Cardiovascular: Normal rate and regular rhythm.   Pulmonary/Chest: Effort normal. No respiratory distress. She has no wheezes.  Abdominal: She exhibits no distension. There is no tenderness.  Neurological: She is alert and oriented to person, place, and time. She exhibits normal muscle tone. Coordination normal.  Her hip flexion and extension and right and left are 4/5 barely able to push against my hands in addition to gravity, flexion extension about the knee also 4/ 5 in dorsiflexion and flexion 4/5  Skin: Skin is warm and dry. She is not diaphoretic. No erythema. No pallor.  Psychiatric: Her behavior is normal. Judgment and thought content normal. She exhibits a depressed mood.    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 Patient should remain on these antibiotics through that were the  20th.  We'll see her back in infectious disease clinic on February 18 and recheck inflammatory markers and sedimentation rate and C-reactive protein.  It is imperative if she  has any worsening weakness in her large cavities or worsening pain she'll let me know immediately and also we would want to get in touch with neurosurgery immediately.  Also she'll continue to improve. If she does not improve sufficiently are we still have anxiety about her thoracic disc space and spine we will get an MRI of the spine.  I spent greater than 25 minutes with the patient including greater than 50% of time in face to face counsel of the patient and in coordination of their care.

## 2013-12-10 ENCOUNTER — Non-Acute Institutional Stay (SKILLED_NURSING_FACILITY): Payer: Medicare HMO | Admitting: Adult Health

## 2013-12-10 DIAGNOSIS — J90 Pleural effusion, not elsewhere classified: Secondary | ICD-10-CM

## 2013-12-10 DIAGNOSIS — J189 Pneumonia, unspecified organism: Secondary | ICD-10-CM

## 2013-12-10 DIAGNOSIS — K5900 Constipation, unspecified: Secondary | ICD-10-CM

## 2013-12-13 ENCOUNTER — Encounter: Payer: Self-pay | Admitting: Adult Health

## 2013-12-13 DIAGNOSIS — J189 Pneumonia, unspecified organism: Secondary | ICD-10-CM | POA: Insufficient documentation

## 2013-12-13 DIAGNOSIS — K5900 Constipation, unspecified: Secondary | ICD-10-CM | POA: Insufficient documentation

## 2013-12-13 DIAGNOSIS — J90 Pleural effusion, not elsewhere classified: Secondary | ICD-10-CM

## 2013-12-13 HISTORY — DX: Pleural effusion, not elsewhere classified: J90

## 2013-12-13 MED ORDER — FUROSEMIDE 40 MG PO TABS: 40.0000 mg | ORAL_TABLET | Freq: Every day | ORAL | Status: DC

## 2013-12-13 MED ORDER — POLYETHYLENE GLYCOL 3350 17 GM/SCOOP PO POWD: 17.0000 g | Freq: Every day | ORAL | Status: DC

## 2013-12-13 MED ORDER — SENNA 8.6 MG PO TABS: 2.0000 | ORAL_TABLET | Freq: Every day | ORAL | Status: DC

## 2013-12-13 NOTE — Progress Notes (Signed)
Patient ID: [name] [name], female   DOB: Apr 28, 1930, 80 y.o.   MRN: [id number]     [facility]  Allergies  Allergen Reactions  . Motrin [Ibuprofen] Hives     Chief Complaint  Patient presents with  . Acute Visit    fever; follow up x-ray results.     HPI:  She has been running a fever for the past 48 hours. She states she has a cough. She is also constipated. Her appetite is poor. She continue to have back pain; but her pain medication is effective for her pain relief.   Past Medical History  Diagnosis Date  . Hypertension   . Gout   . Hypercholesteremia   . Diabetes mellitus     Past Surgical History  Procedure Laterality Date  . Cesarean section    . Rotator cuff repair    . Tonsillectomy    . Tubal ligation      VITAL SIGNS BP 118/76  Pulse 90  Temp(Src) 100.7 F (38.2 C)  Ht 5\' 2"  (1.575 m)  Wt 228 lb (103.42 kg)  BMI 41.69 kg/m2   Patient's Medications  New Prescriptions   No medications on file  Previous Medications   CARVEDILOL (COREG) 25 MG TABLET    Take 0.5 tablets (12.5 mg total) by mouth 2 (two) times daily with a meal. Start now   CLOBETASOL CREAM (TEMOVATE) 0.05 %    Apply 1 application topically 2 (two) times daily. Apply to affected area(s) on leg   DEXTROSE 5 % SOLN 50 ML WITH CEFTRIAXONE 2 G SOLR 2 G    Inject 2 g into the vein daily. X 6 WEEKS, till 01/03/14   FEEDING SUPPLEMENT, GLUCERNA SHAKE, (GLUCERNA SHAKE) LIQD    Take 237 mLs by mouth 2 (two) times daily between meals.   FUROSEMIDE (LASIX) 40 MG TABLET    Take 0.5 tablets (20 mg total) by mouth daily.   HYDRALAZINE (APRESOLINE) 50 MG TABLET    Take 1 tablet (50 mg total) by mouth 3 (three) times daily.   HYDROCODONE-ACETAMINOPHEN (NORCO) 10-325 MG PER TABLET    Take 1 tablet by mouth every 6 (six) hours as needed. Take 1 tablet by mouth every 6 hours as need for pain scale 1-5. *  DO NOT EXCEED 4 GM APAP*; Take 2 tablets by mouth every 6 hours as needed for pain scale 6-10.   LIDOCAINE (LIDODERM) 5 %    Place 1 patch onto the skin daily. Remove & Discard patch within 12 hours or as directed by MD   MULTIPLE VITAMIN (MULTIVITAMIN WITH MINERALS) TABS TABLET    Take 1 tablet by mouth daily.   OMEGA-3 FATTY ACIDS (FISH OIL) 1200 MG CAPS    Take 1 capsule by mouth 2 (two) times daily.   SIMVASTATIN (ZOCOR) 20 MG TABLET    Take 20 mg by mouth daily at 6 PM.   SODIUM CHLORIDE 0.9 % INJECTION    10-40 mLs by Intracatheter route as needed (flush).   SODIUM CHLORIDE 0.9 % SOLN 500 ML WITH VANCOMYCIN 10 G SOLR 1,500 MG    Inject 1,500 mg into the vein daily. X 6 WEEKS, till 01/03/14   TRAMADOL (ULTRAM) 50 MG TABLET    Take 1 tablet (50 mg total) by mouth every 6 (six) hours as needed for moderate pain.   VITAMIN C (ASCORBIC ACID) 500 MG TABLET    Take 500 mg by mouth daily.  Modified Medications   No medications on  file  Discontinued Medications   No medications on file    SIGNIFICANT DIAGNOSTIC EXAMS  11-20-13: thoracic spine x-ray: 1. Highly unusual appearance of T10 and T11, as above, which is concern for potential discitis/osteomyelitis. Given the patient's history of recent fall, these findings could simply be related to acute trauma, however, the marked irregularity of the endplates at this level raises concern for infection. Correlation with MRI of the thoracic spine with and without IV gadolinium is recommended. 2. The appearance of the lung parenchyma suggests an underlying interstitial lung disease. This could be better evaluated with followup nonemergent high-resolution chest CT if clinicallyappropriate.  11-20-13: lumbar spine x-ray: 1. The appearance of T10 and T11 remains concerning for potential discitis/osteomyelitis, and correlation with MRI of the thoracic spine with and without IV gadolinium is strongly recommended.2. No acute radiographic abnormality of the lumbar spine itself.  11-20-13: mri lumbar spine: Abnormal destructive changes of the T10-11 disc,  No MR  findings of discitis osteomyelitis in the lumbar spine. Degenerative change of the lumbar spine: Moderate canal stenosis at L3-4, mild at L4-5. Neural foraminal narrowing L2-3 to L5-S1: Severe on the right at L5-S1. Mildly widened facets at L3-4, which can be associated with dynamic instability and could be further characterize with flexion and extension radiographs if clinically indicated.  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 Moderate canal stenosis at T10-11 with severe neural foraminal narrowing. Abnormal signal within T11 disc concerning for early discitis without osteomyelitis. Degenerative change at this level resulting in mild canal stenosis and moderate to severe right, moderate left neural foraminal narrowing. Grade 1 paraspinal muscle strain T10-11.    11-25-13: chest x-ray: 1. Right upper extremity PICC with the tip in the right atrium. This could be retracted 2 cm for positioning at the junction of the superior vena cava and right atrium. 2. Diffuse reticular pattern in the lungs suggesting chronic interstitial lung disease. Other differential considerations discussed above. 3. Borderline cardiomegaly.  11-09-14: chest x-ray: 1. Poor inspiration due to patient habitus. 2. Minimal cardiomegaly 3. Moderate to severe pulmonary vascular congestion 4. Small right pulmonary effusion. 5. Patchy pneumonitis or edema at mid to lower bilateral lungs.   11-09-14: kub: mild ileus no obstruction. Moderate diffuse retained colorectal stool no abnormal calcifications seen    LABS REVIEWED:   11-20-13: wbc 8.7; hgb 12.1; hct 37.3; mcv 86.1plt 202; glucose 125; bun 32; creat 1.85; k+3.9; na++144; ca++ 10.8; sed rate 27; BNP 894.4; hgb a1c 6.8; tsh 1.225 11-25-13: wbc 7.6; hgb 10.8; hct 34.1; mcv 86.3;plt 178; glucose 125; bun 20; creat 1.48;  k+4.1; na++140; ca++ 10.2 BNP 1142       Review of Systems  Constitutional: Negative for malaise/fatigue.       Has poor appetite  Eyes: Negative for blurred vision.  Respiratory: Negative for  shortness of breath.  has cough Cardiovascular: Negative for chest pain, palpitations and leg swelling.  Gastrointestinal: Negative for heartburn, diarrhea has constipation Musculoskeletal: Positive for back pain and myalgias.       Her pain is being managed at this time.   Neurological: Negative for dizziness and headaches.  Psychiatric/Behavioral: Negative for depression. The patient is not nervous/anxious.     Physical Exam  Constitutional: She is oriented to person, place, and time. She appears well-developed and well-nourished. No distress.  obese  Neck: Neck supple. No JVD present. No  thyromegaly present.  Cardiovascular: Normal rate, regular rhythm and intact distal pulses.   Respiratory: Effort normal. No respiratory distress. She has wheezes.  GI: Soft. Bowel sounds are normal. She exhibits no distension. There is no tenderness.  Musculoskeletal: She exhibits no edema.  Is able to move all extremities; has mild lower extremity weakness present.   Neurological: She is alert and oriented to person, place, and time.  Skin: Skin is warm and dry. She is not diaphoretic.  Psychiatric: She has a normal mood and affect.     ASSESSMENT/ PLAN:  1. Pneumonia: I am concerned that she may aspirated; she has a pneumonia however; she is on iv vancomycin and ceftriaxone. Her posture is very poor since she is eating in bed. Will being zithromax 500 mg daily for one week. Will have speech therapy evaluate and treat for possible pneumonia and will monitor her status. Will repeat chest x-ray in one week  2. Right pleural effusion:  Will increase her lasix to 40 mg daily and will check bmp in one week will monitor her status   3. Constipation: will being miralax daily and senna s 2 tabs daily and  will monitor her status.

## 2013-12-16 ENCOUNTER — Non-Acute Institutional Stay (SKILLED_NURSING_FACILITY): Payer: Medicare HMO | Admitting: Adult Health

## 2013-12-16 ENCOUNTER — Emergency Department (HOSPITAL_COMMUNITY): Payer: Medicare HMO

## 2013-12-16 ENCOUNTER — Encounter (HOSPITAL_COMMUNITY): Payer: Self-pay | Admitting: Emergency Medicine

## 2013-12-16 ENCOUNTER — Encounter: Payer: Self-pay | Admitting: Adult Health

## 2013-12-16 ENCOUNTER — Inpatient Hospital Stay (HOSPITAL_COMMUNITY)
Admission: EM | Admit: 2013-12-16 | Discharge: 2013-12-21 | DRG: 193 | Disposition: A | Discharge status: Skilled Nursing Facility | Payer: Medicare HMO | Attending: Internal Medicine | Admitting: Internal Medicine

## 2013-12-16 DIAGNOSIS — Z8249 Family history of ischemic heart disease and other diseases of the circulatory system: Secondary | ICD-10-CM

## 2013-12-16 DIAGNOSIS — R404 Transient alteration of awareness: Secondary | ICD-10-CM | POA: Diagnosis present

## 2013-12-16 DIAGNOSIS — M549 Dorsalgia, unspecified: Secondary | ICD-10-CM

## 2013-12-16 DIAGNOSIS — I5032 Chronic diastolic (congestive) heart failure: Secondary | ICD-10-CM | POA: Diagnosis present

## 2013-12-16 DIAGNOSIS — Z886 Allergy status to analgesic agent status: Secondary | ICD-10-CM

## 2013-12-16 DIAGNOSIS — J9601 Acute respiratory failure with hypoxia: Secondary | ICD-10-CM | POA: Diagnosis present

## 2013-12-16 DIAGNOSIS — E7800 Pure hypercholesterolemia, unspecified: Secondary | ICD-10-CM | POA: Diagnosis present

## 2013-12-16 DIAGNOSIS — M9080 Osteopathy in diseases classified elsewhere, unspecified site: Secondary | ICD-10-CM | POA: Diagnosis present

## 2013-12-16 DIAGNOSIS — I2699 Other pulmonary embolism without acute cor pulmonale: Secondary | ICD-10-CM

## 2013-12-16 DIAGNOSIS — M869 Osteomyelitis, unspecified: Secondary | ICD-10-CM | POA: Diagnosis present

## 2013-12-16 DIAGNOSIS — I872 Venous insufficiency (chronic) (peripheral): Secondary | ICD-10-CM | POA: Diagnosis present

## 2013-12-16 DIAGNOSIS — R609 Edema, unspecified: Secondary | ICD-10-CM

## 2013-12-16 DIAGNOSIS — Z6841 Body Mass Index (BMI) 40.0 and over, adult: Secondary | ICD-10-CM

## 2013-12-16 DIAGNOSIS — E1169 Type 2 diabetes mellitus with other specified complication: Secondary | ICD-10-CM | POA: Diagnosis present

## 2013-12-16 DIAGNOSIS — J9600 Acute respiratory failure, unspecified whether with hypoxia or hypercapnia: Secondary | ICD-10-CM | POA: Diagnosis present

## 2013-12-16 DIAGNOSIS — R0902 Hypoxemia: Secondary | ICD-10-CM

## 2013-12-16 DIAGNOSIS — I129 Hypertensive chronic kidney disease with stage 1 through stage 4 chronic kidney disease, or unspecified chronic kidney disease: Secondary | ICD-10-CM | POA: Diagnosis present

## 2013-12-16 DIAGNOSIS — J189 Pneumonia, unspecified organism: Principal | ICD-10-CM | POA: Diagnosis present

## 2013-12-16 DIAGNOSIS — E669 Obesity, unspecified: Secondary | ICD-10-CM | POA: Diagnosis present

## 2013-12-16 DIAGNOSIS — M542 Cervicalgia: Secondary | ICD-10-CM | POA: Diagnosis present

## 2013-12-16 DIAGNOSIS — Z87891 Personal history of nicotine dependence: Secondary | ICD-10-CM

## 2013-12-16 DIAGNOSIS — N184 Chronic kidney disease, stage 4 (severe): Secondary | ICD-10-CM | POA: Diagnosis present

## 2013-12-16 DIAGNOSIS — M4624 Osteomyelitis of vertebra, thoracic region: Secondary | ICD-10-CM

## 2013-12-16 DIAGNOSIS — I509 Heart failure, unspecified: Secondary | ICD-10-CM | POA: Diagnosis present

## 2013-12-16 DIAGNOSIS — I10 Essential (primary) hypertension: Secondary | ICD-10-CM

## 2013-12-16 DIAGNOSIS — K5900 Constipation, unspecified: Secondary | ICD-10-CM

## 2013-12-16 DIAGNOSIS — M47814 Spondylosis without myelopathy or radiculopathy, thoracic region: Secondary | ICD-10-CM | POA: Diagnosis present

## 2013-12-16 DIAGNOSIS — J690 Pneumonitis due to inhalation of food and vomit: Secondary | ICD-10-CM | POA: Diagnosis present

## 2013-12-16 DIAGNOSIS — Z79899 Other long term (current) drug therapy: Secondary | ICD-10-CM

## 2013-12-16 LAB — POCT I-STAT 3, ART BLOOD GAS (G3+)
ACID-BASE EXCESS: 2 mmol/L (ref 0.0–2.0)
Bicarbonate: 26 mEq/L — ABNORMAL HIGH (ref 20.0–24.0)
O2 Saturation: 97 %
PH ART: 7.431 (ref 7.350–7.450)
TCO2: 27 mmol/L (ref 0–100)
pCO2 arterial: 39 mmHg (ref 35.0–45.0)
pO2, Arterial: 92 mmHg (ref 80.0–100.0)

## 2013-12-16 LAB — URINALYSIS, ROUTINE W REFLEX MICROSCOPIC
Bilirubin Urine: NEGATIVE
GLUCOSE, UA: NEGATIVE mg/dL
HGB URINE DIPSTICK: NEGATIVE
Ketones, ur: NEGATIVE mg/dL
Leukocytes, UA: NEGATIVE
Nitrite: NEGATIVE
Protein, ur: NEGATIVE mg/dL
SPECIFIC GRAVITY, URINE: 1.02 (ref 1.005–1.030)
UROBILINOGEN UA: 0.2 mg/dL (ref 0.0–1.0)
pH: 5 (ref 5.0–8.0)

## 2013-12-16 LAB — CBC WITH DIFFERENTIAL/PLATELET
Basophils Absolute: 0.1 10*3/uL (ref 0.0–0.1)
Basophils Relative: 1 % (ref 0–1)
Eosinophils Absolute: 0.3 10*3/uL (ref 0.0–0.7)
Eosinophils Relative: 3 % (ref 0–5)
HCT: 30.2 % — ABNORMAL LOW (ref 36.0–46.0)
HEMOGLOBIN: 9.8 g/dL — AB (ref 12.0–15.0)
LYMPHS ABS: 1.3 10*3/uL (ref 0.7–4.0)
LYMPHS PCT: 15 % (ref 12–46)
MCH: 26.9 pg (ref 26.0–34.0)
MCHC: 32.5 g/dL (ref 30.0–36.0)
MCV: 83 fL (ref 78.0–100.0)
Monocytes Absolute: 0.9 10*3/uL (ref 0.1–1.0)
Monocytes Relative: 11 % (ref 3–12)
NEUTROS PCT: 70 % (ref 43–77)
Neutro Abs: 5.8 10*3/uL (ref 1.7–7.7)
PLATELETS: 199 10*3/uL (ref 150–400)
RBC: 3.64 MIL/uL — AB (ref 3.87–5.11)
RDW: 15.7 % — ABNORMAL HIGH (ref 11.5–15.5)
WBC: 8.2 10*3/uL (ref 4.0–10.5)

## 2013-12-16 LAB — BASIC METABOLIC PANEL
BUN: 39 mg/dL — ABNORMAL HIGH (ref 6–23)
CO2: 21 mEq/L (ref 19–32)
Calcium: 10.3 mg/dL (ref 8.4–10.5)
Chloride: 95 mEq/L — ABNORMAL LOW (ref 96–112)
Creatinine, Ser: 1.69 mg/dL — ABNORMAL HIGH (ref 0.50–1.10)
GFR calc Af Amer: 32 mL/min — ABNORMAL LOW (ref >90)
GFR calc non Af Amer: 27 mL/min — ABNORMAL LOW (ref >90)
Glucose, Bld: 154 mg/dL — ABNORMAL HIGH (ref 70–99)
Potassium: 4.5 mEq/L (ref 3.7–5.3)
Sodium: 133 mEq/L — ABNORMAL LOW (ref 137–147)

## 2013-12-16 LAB — D-DIMER, QUANTITATIVE (NOT AT ARMC): D DIMER QUANT: 1.61 ug{FEU}/mL — AB (ref 0.00–0.48)

## 2013-12-16 LAB — TROPONIN I: Troponin I: <0.3 ng/mL (ref <0.30)

## 2013-12-16 LAB — PRO B NATRIURETIC PEPTIDE: Pro B Natriuretic peptide (BNP): 11565 pg/mL — ABNORMAL HIGH (ref 0–450)

## 2013-12-16 MED ORDER — ALTEPLASE 2 MG IJ SOLR
2.0000 mg | Freq: Once | INTRAMUSCULAR | Status: DC
  Filled 2013-12-16: qty 2

## 2013-12-16 MED ORDER — FUROSEMIDE 10 MG/ML IJ SOLN
20.0000 mg | Freq: Once | INTRAMUSCULAR | Status: AC
  Administered 2013-12-17: 20 mg via INTRAVENOUS

## 2013-12-16 MED ORDER — ALTEPLASE 100 MG IV SOLR
2.0000 mg | Freq: Once | INTRAVENOUS | Status: AC
  Administered 2013-12-16: 2 mg
  Filled 2013-12-16: qty 2

## 2013-12-16 MED ORDER — TRAMADOL HCL 50 MG PO TABS
50.0000 mg | ORAL_TABLET | Freq: Four times a day (QID) | ORAL | Status: DC | PRN
  Administered 2013-12-17 – 2013-12-20 (×4): 50 mg via ORAL
  Filled 2013-12-16 (×6): qty 1

## 2013-12-16 MED ORDER — FUROSEMIDE 10 MG/ML IJ SOLN
40.0000 mg | Freq: Two times a day (BID) | INTRAMUSCULAR | Status: DC
  Administered 2013-12-17 – 2013-12-18 (×4): 40 mg via INTRAVENOUS
  Filled 2013-12-16 (×5): qty 4

## 2013-12-16 MED ORDER — OSELTAMIVIR PHOSPHATE 30 MG PO CAPS
30.0000 mg | ORAL_CAPSULE | Freq: Two times a day (BID) | ORAL | Status: DC
  Administered 2013-12-17 (×2): 30 mg via ORAL
  Filled 2013-12-16 (×3): qty 1

## 2013-12-16 MED ORDER — VANCOMYCIN HCL 10 G IV SOLR
1500.0000 mg | INTRAVENOUS | Status: DC
  Administered 2013-12-17 – 2013-12-20 (×3): 1500 mg via INTRAVENOUS
  Filled 2013-12-16 (×4): qty 1500

## 2013-12-16 MED ORDER — IPRATROPIUM-ALBUTEROL 0.5-2.5 (3) MG/3ML IN SOLN
3.0000 mL | RESPIRATORY_TRACT | Status: DC
  Administered 2013-12-16 – 2013-12-17 (×5): 3 mL via RESPIRATORY_TRACT
  Filled 2013-12-16 (×6): qty 3

## 2013-12-16 MED ORDER — DEXTROSE 5 % IV SOLN: 1.0000 g | Freq: Three times a day (TID) | INTRAVENOUS | Status: DC

## 2013-12-16 MED ORDER — SODIUM CHLORIDE 0.9 % IV SOLN
INTRAVENOUS | Status: AC
  Administered 2013-12-16: 10 mL/h via INTRAVENOUS

## 2013-12-16 MED ORDER — FUROSEMIDE 10 MG/ML IJ SOLN
40.0000 mg | Freq: Once | INTRAMUSCULAR | Status: AC
  Administered 2013-12-16: 40 mg via INTRAVENOUS
  Filled 2013-12-16: qty 4

## 2013-12-16 MED ORDER — OSELTAMIVIR PHOSPHATE 75 MG PO CAPS: 75.0000 mg | ORAL_CAPSULE | Freq: Two times a day (BID) | ORAL | Status: DC

## 2013-12-16 MED ORDER — GUAIFENESIN ER 600 MG PO TB12
600.0000 mg | ORAL_TABLET | Freq: Two times a day (BID) | ORAL | Status: DC
  Administered 2013-12-17 – 2013-12-21 (×10): 600 mg via ORAL
  Filled 2013-12-16 (×11): qty 1

## 2013-12-16 MED ORDER — IPRATROPIUM-ALBUTEROL 0.5-2.5 (3) MG/3ML IN SOLN
3.0000 mL | RESPIRATORY_TRACT | Status: DC | PRN
  Administered 2013-12-16 – 2013-12-19 (×4): 3 mL via RESPIRATORY_TRACT
  Filled 2013-12-16 (×5): qty 3

## 2013-12-16 MED ORDER — DEXTROSE 5 % IV SOLN
1.0000 g | Freq: Two times a day (BID) | INTRAVENOUS | Status: DC
  Administered 2013-12-16 – 2013-12-18 (×4): 1 g via INTRAVENOUS
  Filled 2013-12-16 (×5): qty 1

## 2013-12-16 MED ORDER — ALTEPLASE 2 MG IJ SOLR
2.0000 mg | Freq: Once | INTRAMUSCULAR | Status: AC
  Filled 2013-12-16: qty 2

## 2013-12-16 MED ORDER — LIDOCAINE 5 % EX PTCH
1.0000 | MEDICATED_PATCH | CUTANEOUS | Status: DC
  Administered 2013-12-17 – 2013-12-21 (×5): 1 via TRANSDERMAL
  Filled 2013-12-16 (×5): qty 1

## 2013-12-16 MED ORDER — ENOXAPARIN SODIUM 120 MG/0.8ML ~~LOC~~ SOLN
1.0000 mg/kg | Freq: Once | SUBCUTANEOUS | Status: AC
  Administered 2013-12-16: 105 mg via SUBCUTANEOUS
  Filled 2013-12-16: qty 0.8

## 2013-12-16 MED ORDER — HYDRALAZINE HCL 50 MG PO TABS
50.0000 mg | ORAL_TABLET | Freq: Three times a day (TID) | ORAL | Status: DC
  Administered 2013-12-17 – 2013-12-21 (×15): 50 mg via ORAL
  Filled 2013-12-16 (×16): qty 1

## 2013-12-16 MED ORDER — SIMVASTATIN 20 MG PO TABS
20.0000 mg | ORAL_TABLET | Freq: Every day | ORAL | Status: DC
  Administered 2013-12-17 – 2013-12-20 (×3): 20 mg via ORAL
  Filled 2013-12-16 (×5): qty 1

## 2013-12-16 MED ORDER — CARVEDILOL 12.5 MG PO TABS
12.5000 mg | ORAL_TABLET | Freq: Two times a day (BID) | ORAL | Status: DC
  Administered 2013-12-17 – 2013-12-19 (×4): 12.5 mg via ORAL
  Filled 2013-12-16 (×8): qty 1

## 2013-12-16 NOTE — ED Notes (Signed)
IV team, [name] [name], at bedside.

## 2013-12-16 NOTE — H&P (Signed)
[facility] History and Physical  Patient: [name] [name]  ACZ:[id number]  DOB: [birth year]  DOS: the patient was seen and examined on 12/16/2013 PCP: No PCP Per Patient  Chief Complaint: Cough and shortness of breath  HPI: [name] [name] is a 80 y.o. female with Past medical history of hypertension, glaucoma hypercholesterolemia, diabetes mellitus, chronic kidney disease, recent thoracic osteomyelitis. The patient is coming from SNF. The patient presented with complaints of cough and shortness of breath that has been ongoing since last few days. She was recently admitted for thoracic osteomyelitis and was discharged on IV vancomycin and IV ceftriaxone. In the nursing home 3 days ago she was found to have worsening cough and shortness of breath with yellowish sputum production without any blood and progressively worsening generalized weakness. She was started on azithromycin as an added antibiotic to her regimen. Despite that her breathing was progressively worsening and today she was short of breath even at rest and therefore they brought her here as she was also found to be hypoxic and high 80s on room air. She denies any chest pain, fever, palpitation, orthopnea, PND. The daughter has noted that she has been wheezing more frequently. She also had some chills. That is a suspicion that she might have choked up on her meal by the nurse practitioner in SNF  Review of Systems: as mentioned in the history of present illness.  A Comprehensive review of the other systems is negative.  Past Medical History  Diagnosis Date  . Hypertension   . Gout   . Hypercholesteremia   . Diabetes mellitus    Past Surgical History  Procedure Laterality Date  . Cesarean section    . Rotator cuff repair    . Tonsillectomy    . Tubal ligation     Social History:  reports that she quit smoking about 34 years ago. She does not have any smokeless tobacco history on file. She reports that she does not drink  alcohol or use illicit drugs. Partially Independent for most of her  ADL.  Allergies  Allergen Reactions  . Motrin [Ibuprofen] Hives    Family History  Problem Relation Age of Onset  . Hypertension Mother   . Hypertension Father   . Hypertension Daughter     Prior to Admission medications   Medication Sig Start Date End Date Taking? Authorizing Provider  lidocaine (LIDODERM) 5 % Place 1 patch onto the skin daily. Remove & Discard patch within 12 hours or as directed by MD 11/26/13  Yes Ripudeep Krystal Eaton, MD  carvedilol (COREG) 25 MG tablet Take 0.5 tablets (12.5 mg total) by mouth 2 (two) times daily with a meal. Start now 11/26/13   Ripudeep Krystal Eaton, MD  clobetasol cream (TEMOVATE) 1.09 % Apply 1 application topically 2 (two) times daily. Apply to affected area(s) on leg    Historical Provider, MD  dextrose 5 % SOLN 50 mL with cefTRIAXone 2 G SOLR 2 g Inject 2 g into the vein daily. X 6 WEEKS, till 01/03/14 11/26/13   Ripudeep Krystal Eaton, MD  feeding supplement, GLUCERNA SHAKE, (GLUCERNA SHAKE) LIQD Take 237 mLs by mouth 2 (two) times daily between meals. 11/26/13   Ripudeep Krystal Eaton, MD  furosemide (LASIX) 40 MG tablet Take 1 tablet (40 mg total) by mouth daily. 12/13/13   Gerlene Fee, NP  hydrALAZINE (APRESOLINE) 50 MG tablet Take 1 tablet (50 mg total) by mouth 3 (three) times daily. 11/26/13   Ripudeep Krystal Eaton, MD  HYDROcodone-acetaminophen (NORCO) 10-325 MG per tablet Take 1 tablet by mouth every 6 (six) hours as needed. Take 1 tablet by mouth every 6 hours as need for pain scale 1-5. *  DO NOT EXCEED 4 GM APAP*; Take 2 tablets by mouth every 6 hours as needed for pain scale 6-10. 12/01/13   Blanchie Serve, MD  Multiple Vitamin (MULTIVITAMIN WITH MINERALS) TABS tablet Take 1 tablet by mouth daily. 11/26/13   Ripudeep Krystal Eaton, MD  Omega-3 Fatty Acids (FISH OIL) 1200 MG CAPS Take 1 capsule by mouth 2 (two) times daily.    Historical Provider, MD  polyethylene glycol powder (GLYCOLAX/MIRALAX) powder Take 17 g by  mouth daily. 12/13/13   Gerlene Fee, NP  senna (SENOKOT) 8.6 MG TABS tablet Take 2 tablets (17.2 mg total) by mouth daily. 12/13/13   Gerlene Fee, NP  simvastatin (ZOCOR) 20 MG tablet Take 20 mg by mouth daily at 6 PM.    Historical Provider, MD  sodium chloride 0.9 % injection 10-40 mLs by Intracatheter route as needed (flush). 11/26/13   Ripudeep Krystal Eaton, MD  sodium chloride 0.9 % SOLN 500 mL with vancomycin 10 G SOLR 1,500 mg Inject 1,500 mg into the vein daily. X 6 WEEKS, till 01/03/14 11/26/13   Ripudeep Krystal Eaton, MD  traMADol (ULTRAM) 50 MG tablet Take 1 tablet (50 mg total) by mouth every 6 (six) hours as needed for moderate pain. 12/01/13   Blanchie Serve, MD  vitamin C (ASCORBIC ACID) 500 MG tablet Take 500 mg by mouth daily.    Historical Provider, MD    Physical Exam: Filed Vitals:   12/16/13 2045 12/16/13 2130 12/16/13 2145 12/16/13 2215  BP: 145/83 129/78 126/105 128/66  Pulse: 85 87 88 95  Temp:      TempSrc:      Resp: 14 19 18 19   Height:      Weight:      SpO2: 96% 97% 96% 95%    General: Alert, Awake and Oriented to Time, Place and Person. Appear in marked distress Eyes: PERRL ENT: Oral Mucosa clear moist. Neck: Minimal JVD Cardiovascular: S1 and S2 Present, no Murmur, Peripheral Pulses Present Respiratory: Bilateral Air entry equal and Decreased, bilateral rhonchi and Crackles, expiratory wheezes Abdomen: Bowel Sound Present, Soft and Non tender Skin: No Rash Extremities: Bilateral Pedal edema, no calf tenderness Neurologic: Grossly Unremarkable.  Labs on Admission:  CBC:  Recent Labs Lab 12/16/13 2021  WBC 8.2  NEUTROABS 5.8  HGB 9.8*  HCT 30.2*  MCV 83.0  PLT 199    CMP     Component Value Date/Time   NA 133* 12/16/2013 1812   K 4.5 12/16/2013 1812   CL 95* 12/16/2013 1812   CO2 21 12/16/2013 1812   GLUCOSE 154* 12/16/2013 1812   BUN 39* 12/16/2013 1812   CREATININE 1.69* 12/16/2013 1812   CALCIUM 10.3 12/16/2013 1812   GFRNONAA 27* 12/16/2013 1812    GFRAA 32* 12/16/2013 1812    No results found for this basename: LIPASE, AMYLASE,  in the last 168 hours No results found for this basename: AMMONIA,  in the last 168 hours   Recent Labs Lab 12/16/13 1812  TROPONINI <0.30   BNP (last 3 results)  Recent Labs  11/20/13 0530 11/25/13 0535 12/16/13 1812  PROBNP 894.4* 1142.0* 11565.0*    Radiological Exams on Admission: Dg Chest Port 1 View  12/16/2013   CLINICAL DATA:  Shortness of breath.  EXAM: PORTABLE CHEST - 1 VIEW  COMPARISON:  11/25/2013  FINDINGS: PICC tip is at the cavoatrial junction. There is diffuse increased interstitial accentuation bilaterally which may represent pulmonary edema or pneumonitis. Increased density at the right lung base may represent a small effusion.  Severe arthritic changes seen at both shoulders.  Pulmonary vascularity is within normal limits. Heart size is at the upper limits of normal.  IMPRESSION: Progressive accentuation of interstitial markings bilaterally which may represent pulmonary edema or pneumonitis.   Electronically Signed   By: Rozetta Nunnery M.D.   On: 12/16/2013 17:28    EKG: Independently reviewed. sinus tachycardia.  Assessment/Plan Active Problems:   CKD (chronic kidney disease) stage 4, GFR 15-29 ml/min   Osteomyelitis of thoracic spine   Edema   Hypertension   HCAP (healthcare-associated pneumonia)   Congestive heart failure   Acute respiratory failure with hypoxia   1. Acute respiratory failure with hypoxia The patient is presenting with complains of cough and shortness of breath associated with that she has bilateral leg swelling. Her chest x-ray has extensive bilateral changes which could suggest pulmonary edema versus pneumonia. She also has expiratory wheezing. Along with elevated proBNP she also has elevated d-dimer With her wheezing and appearance of the chest x-ray my suspicion for healthcare associated pneumonia versus aspiration pneumonia is high. I will continue  her on vancomycin and I would change her to IV cefepime. I would place her in step down unit. DuoNeb as needed and BiPAP as needed I will keep her n.p.o. and get swallowing evaluation Patient has been given one dose of Lovenox for possible PE, I will get duplex of her lower extremity and if negative then PE is less likely. I would also treat her empirically with Tamiflu  2. Chronic diastolic Congestive heart failure The patient does have mild JVD but as per the family her swelling is stable as well as weight. She has received one dose of IV Lasix in the ED and I would continue her on IV Lasix in the hospital Monitor BMP  3. Hypertension Stable continue home antihypertensive medications  4. Osteoarthritis of the thoracic spine Check vancomycin random level and continue vancomycin and cefepime  5 chronic kidney disease Stable avoid nephrotoxic medications  DVT Prophylaxis: subcutaneous HeparinNutrition: N.p.o.  Code Status: Full  Family Communication: Daughter was present at bedside, opportunity was given to ask question and all questions were answered satisfactorily at the time of interview. Disposition: Admitted to inpatient in step-down unit.  Author: Berle Mull, MD [facility] Pager: [phone] 12/16/2013, 10:33 PM    If 7PM-7AM, please contact night-coverage [email or web address] Password [email or web address]

## 2013-12-16 NOTE — ED Notes (Signed)
Pt has PICC line. Called IV team to verify usage of PICC line. OK to use. Line has clotted, unable to draw labs from line. Will call IV team to de-clot line.

## 2013-12-16 NOTE — Progress Notes (Signed)
Patient ID: [name] [name], female   DOB: 01/16/1930, 80 y.o.   MRN: [id number]     [facility]  Allergies  Allergen Reactions  . Motrin [Ibuprofen] Hives     Chief Complaint  Patient presents with  . Acute Visit    resp. status     HPI:  She is presently being treated for heath care associated pneumonia with zithromax 500 mg daily for one week. She is on iv abt for osteomyelitis as well. Today she has become more short of breath has require the use of of 02 due to sate of <88% on room. She is more lethargic and less able to participate in the hpi or ros. I am concerned about PE.   Past Medical History  Diagnosis Date  . Hypertension   . Gout   . Hypercholesteremia   . Diabetes mellitus     Past Surgical History  Procedure Laterality Date  . Cesarean section    . Rotator cuff repair    . Tonsillectomy    . Tubal ligation      VITAL SIGNS BP 119/78  Pulse 90  Ht 5\' 2"  (1.575 m)  Wt 228 lb (103.42 kg)  BMI 41.69 kg/m2   Patient's Medications  New Prescriptions   No medications on file  Previous Medications   CARVEDILOL (COREG) 25 MG TABLET    Take 0.5 tablets (12.5 mg total) by mouth 2 (two) times daily with a meal. Start now   CLOBETASOL CREAM (TEMOVATE) 0.05 %    Apply 1 application topically 2 (two) times daily. Apply to affected area(s) on leg   DEXTROSE 5 % SOLN 50 ML WITH CEFTRIAXONE 2 G SOLR 2 G    Inject 2 g into the vein daily. X 6 WEEKS, till 01/03/14   FEEDING SUPPLEMENT, GLUCERNA SHAKE, (GLUCERNA SHAKE) LIQD    Take 237 mLs by mouth 2 (two) times daily between meals.   FUROSEMIDE (LASIX) 40 MG TABLET    Take 1 tablet (40 mg total) by mouth daily.   HYDRALAZINE (APRESOLINE) 50 MG TABLET    Take 1 tablet (50 mg total) by mouth 3 (three) times daily.   HYDROCODONE-ACETAMINOPHEN (NORCO) 10-325 MG PER TABLET    Take 1 tablet by mouth every 6 (six) hours as needed. Take 1 tablet by mouth every 6 hours as need for pain scale 1-5. *  DO NOT EXCEED 4 GM APAP*;  Take 2 tablets by mouth every 6 hours as needed for pain scale 6-10.   LIDOCAINE (LIDODERM) 5 %    Place 1 patch onto the skin daily. Remove & Discard patch within 12 hours or as directed by MD   MULTIPLE VITAMIN (MULTIVITAMIN WITH MINERALS) TABS TABLET    Take 1 tablet by mouth daily.   OMEGA-3 FATTY ACIDS (FISH OIL) 1200 MG CAPS    Take 1 capsule by mouth 2 (two) times daily.   POLYETHYLENE GLYCOL POWDER (GLYCOLAX/MIRALAX) POWDER    Take 17 g by mouth daily.   SENNA (SENOKOT) 8.6 MG TABS TABLET    Take 2 tablets (17.2 mg total) by mouth daily.   SIMVASTATIN (ZOCOR) 20 MG TABLET    Take 20 mg by mouth daily at 6 PM.   SODIUM CHLORIDE 0.9 % INJECTION    10-40 mLs by Intracatheter route as needed (flush).   SODIUM CHLORIDE 0.9 % SOLN 500 ML WITH VANCOMYCIN 10 G SOLR 1,500 MG    Inject 1,500 mg into the vein daily. X 6 WEEKS,  till 01/03/14   TRAMADOL (ULTRAM) 50 MG TABLET    Take 1 tablet (50 mg total) by mouth every 6 (six) hours as needed for moderate pain.   VITAMIN C (ASCORBIC ACID) 500 MG TABLET    Take 500 mg by mouth daily.  Modified Medications   No medications on file  Discontinued Medications   No medications on file    SIGNIFICANT DIAGNOSTIC EXAMS  11-20-13: thoracic spine x-ray: 1. Highly unusual appearance of T10 and T11, as above, which is concern for potential discitis/osteomyelitis. Given the patient's history of recent fall, these findings could simply be related to acute trauma, however, the marked irregularity of the endplates at this level raises concern for infection. Correlation with MRI of the thoracic spine with and without IV gadolinium is recommended. 2. The appearance of the lung parenchyma suggests an underlying interstitial lung disease. This could be better evaluated with followup nonemergent high-resolution chest CT if clinicallyappropriate.  11-20-13: lumbar spine x-ray: 1. The appearance of T10 and T11 remains concerning for potential discitis/osteomyelitis, and  correlation with MRI of the thoracic spine with and without IV gadolinium is strongly recommended.2. No acute radiographic abnormality of the lumbar spine itself.  11-20-13: mri lumbar spine: Abnormal destructive changes of the T10-11 disc,  No MR findings of discitis osteomyelitis in the lumbar spine. Degenerative change of the lumbar spine: Moderate canal stenosis at L3-4, mild at L4-5. Neural foraminal narrowing L2-3 to L5-S1: Severe on the right at L5-S1. Mildly widened facets at L3-4, which can be associated with dynamic instability and could be further characterize with flexion and extension radiographs if clinically indicated.  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 Moderate canal stenosis at T10-11 with severe neural foraminal narrowing. Abnormal signal within T11 disc concerning for early discitis without osteomyelitis. Degenerative change at this level resulting in mild canal stenosis and moderate to severe right, moderate left neural foraminal narrowing. Grade 1 paraspinal muscle strain T10-11.    11-25-13: chest x-ray: 1. Right upper extremity PICC with the tip in the right atrium. This could be retracted 2 cm for positioning at the junction of the superior vena cava and right atrium. 2. Diffuse reticular pattern in the lungs suggesting chronic interstitial lung disease. Other differential considerations discussed above. 3. Borderline cardiomegaly.  12-10-13: chest x-ray: 1. Poor inspiration due to patient habitus. 2. Minimal cardiomegaly 3. Moderate to severe pulmonary vascular congestion 4. Small right pulmonary effusion. 5. Patchy pneumonitis or edema at mid to lower bilateral lungs.   12-10-13: kub: mild ileus no obstruction. Moderate diffuse retained colorectal stool no abnormal calcifications seen    LABS REVIEWED:   11-20-13:  wbc 8.7; hgb 12.1; hct 37.3; mcv 86.1plt 202; glucose 125; bun 32; creat 1.85; k+3.9; na++144; ca++ 10.8; sed rate 27; BNP 894.4; hgb a1c 6.8; tsh 1.225 11-25-13: wbc 7.6; hgb 10.8; hct 34.1; mcv 86.3;plt 178; glucose 125; bun 20; creat 1.48; k+4.1; na++140; ca++ 10.2 BNP 1142  12-10-13: bun 15; creat 1.2       Review of Systems  Constitutional: Positive for malaise/fatigue.  Respiratory: Positive for shortness of breath. Negative for cough and wheezing.   Cardiovascular: Negative for chest pain, palpitations and leg swelling.  Gastrointestinal: Negative for abdominal pain.  Musculoskeletal: Positive for back pain and myalgias.  Skin: Negative.   Neurological: Positive for weakness.  Psychiatric/Behavioral: The patient is not nervous/anxious.  Physical Exam  Constitutional: No distress.  obese  Neck: Neck supple. No JVD present.  Cardiovascular: Normal rate, regular rhythm and intact distal pulses.   Respiratory: Effort normal. She has wheezes.  Breath sounds diminished throughout  GI: Soft. Bowel sounds are normal. She exhibits no distension. There is no tenderness.  Musculoskeletal: She exhibits no edema.  Is able to move all extremities.   Neurological: She is alert.  Skin: Skin is warm and dry. She is not diaphoretic.       ASSESSMENT/ PLAN:  1. Will send her toe her ED for further workup and evaluation for possible pulmonary embolism.   Time spent with patient 45 minutes.

## 2013-12-16 NOTE — ED Notes (Signed)
Admitting physician at bedside

## 2013-12-16 NOTE — Progress Notes (Signed)
ANTIBIOTIC CONSULT NOTE - INITIAL  Pharmacy Consult for Vancomycin Indication: pneumonia  Allergies  Allergen Reactions  . Motrin [Ibuprofen] Hives    Patient Measurements: Height: 5' (152.4 cm) Weight: 228 lb (103.42 kg) IBW/kg (Calculated) : 45.5  Labs:  Recent Labs  12/16/13 1812 12/16/13 2021  WBC  --  8.2  HGB  --  9.8*  PLT  --  199  CREATININE 1.69*  --     Microbiology: Recent Results (from the past 720 hour(s))  CULTURE, BLOOD (ROUTINE X 2)     Status: None   Collection Time    11/20/13  7:35 AM      Result Value Range Status   Specimen Description BLOOD LEFT ARM   Final   Special Requests BOTTLES DRAWN AEROBIC AND ANAEROBIC 5CC   Final   Culture  Setup Time     Final   Value: 11/20/2013 12:52     Performed at [facility]   Culture     Final   Value: NO GROWTH 5 DAYS     Performed at [facility]   Report Status 11/26/2013 FINAL   Final  CULTURE, BLOOD (ROUTINE X 2)     Status: None   Collection Time    11/20/13  7:50 AM      Result Value Range Status   Specimen Description BLOOD RIGHT ARM   Final   Special Requests BOTTLES DRAWN AEROBIC AND ANAEROBIC 5CC   Final   Culture  Setup Time     Final   Value: 11/20/2013 12:51     Performed at [facility]   Culture     Final   Value: STAPHYLOCOCCUS SPECIES (COAGULASE NEGATIVE)     Note: THE SIGNIFICANCE OF ISOLATING THIS ORGANISM FROM A SINGLE SET OF BLOOD CULTURES WHEN MULTIPLE SETS ARE DRAWN IS UNCERTAIN. PLEASE NOTIFY THE MICROBIOLOGY DEPARTMENT WITHIN ONE WEEK IF SPECIATION AND SENSITIVITIES ARE REQUIRED.     Note: Gram Stain Report Called to,Read Back By and Verified With: KELLY CORUM 11/21/13 @ 1:27PM BY RUSCOE A.     Performed at [facility]   Report Status 11/22/2013 FINAL   Final  URINE CULTURE     Status: None   Collection Time    11/20/13 10:07 AM      Result Value Range Status   Specimen Description URINE, CLEAN CATCH   Final   Special Requests Normal   Final    Culture  Setup Time     Final   Value: 11/20/2013 15:11     Performed at [facility]     Final   Value: 30,000 COLONIES/ML     Performed at [facility]   Culture     Final   Value: Multiple bacterial morphotypes present, none predominant. Suggest appropriate recollection if clinically indicated.     Performed at [facility]   Report Status 11/21/2013 FINAL   Final  AFB CULTURE WITH SMEAR     Status: None   Collection Time    11/22/13 11:18 AM      Result Value Range Status   Specimen Description ABSCESS BACK   Final   Special Requests T 10 T 11   Final   ACID FAST SMEAR     Final   Value: NO ACID FAST BACILLI SEEN     Performed at [facility]   Culture     Final   Value: CULTURE WILL BE  EXAMINED FOR 6 WEEKS BEFORE ISSUING A FINAL REPORT     Performed at [facility]   Report Status PENDING   Incomplete  ANAEROBIC CULTURE     Status: None   Collection Time    11/22/13 11:18 AM      Result Value Range Status   Specimen Description ABSCESS BACK   Final   Special Requests T 10 T 11   Final   Gram Stain     Final   Value: NO WBC SEEN     NO SQUAMOUS EPITHELIAL CELLS SEEN     NO ORGANISMS SEEN     Performed at [facility]   Culture     Final   Value: NO ANAEROBES ISOLATED     Performed at [facility]   Report Status 11/27/2013 FINAL   Final  FUNGUS CULTURE W SMEAR     Status: None   Collection Time    11/22/13 11:18 AM      Result Value Range Status   Specimen Description ABSCESS BACK   Final   Special Requests T 10 T 11   Final   Fungal Smear     Final   Value: NO YEAST OR FUNGAL ELEMENTS SEEN     Performed at [facility]   Culture     Final   Value: CULTURE IN PROGRESS FOR FOUR WEEKS     Performed at [facility]   Report Status PENDING   Incomplete  CULTURE, ROUTINE-ABSCESS     Status: None   Collection Time    11/22/13 11:19 AM      Result Value Range Status   Specimen  Description ABSCESS BACK   Final   Special Requests T 10 T 11   Final   Gram Stain     Final   Value: RARE WBC PRESENT,BOTH PMN AND MONONUCLEAR     NO SQUAMOUS EPITHELIAL CELLS SEEN     NO ORGANISMS SEEN     Performed at [facility]   Culture     Final   Value: NO GROWTH 3 DAYS     Performed at [facility]   Report Status 11/25/2013 FINAL   Final  CULTURE, BLOOD (ROUTINE X 2)     Status: None   Collection Time    11/23/13  2:55 PM      Result Value Range Status   Specimen Description BLOOD LEFT HAND   Final   Special Requests BOTTLES DRAWN AEROBIC ONLY 10CC   Final   Culture  Setup Time     Final   Value: 11/23/2013 20:12     Performed at [facility]   Culture     Final   Value: NO GROWTH 5 DAYS     Performed at [facility]   Report Status 11/29/2013 FINAL   Final  CULTURE, BLOOD (ROUTINE X 2)     Status: None   Collection Time    11/23/13  3:05 PM      Result Value Range Status   Specimen Description BLOOD LEFT HAND   Final   Special Requests BOTTLES DRAWN AEROBIC ONLY 5CC   Final   Culture  Setup Time     Final   Value: 11/23/2013 20:10     Performed at [facility]   Culture     Final   Value: NO GROWTH 5 DAYS     Performed at [facility]  Status 11/29/2013 FINAL   Final    Medical History: Past Medical History  Diagnosis Date  . Hypertension   . Gout   . Hypercholesteremia   . Diabetes mellitus     Assessment: 80 year old female to begin vancomycin and cefepime for pneumonia.  CrCl = 28 ml / min  Goal of Therapy:  Vancomycin trough level 15-20 mcg/ml Appropriate cefepime dosing  Plan:  1) Cefepime 1 Gram iv Q 12 hours 2) Vancomycin 1500 mg iv Q 48 hours 3) Follow up Scr, fever curve, cultures, plan  Thank you. Anette Guarneri, PharmD [phone]  Tad Moore 12/16/2013,10:34 PM

## 2013-12-16 NOTE — ED Notes (Signed)
Pt from [facility] for rehab. Pt recently diagnosed with UTI/PNA and receiving IV antibiotics via PICC line in right arm. Pt became progressively more SOB during PT and sats were in 80's according to staff- placed 2L sats up to 99%. EMS tried on room air 93%, placed back on 2L to 100%. Pt reports she feels she is at her baseline since being diagnosed with PNA. 160/64, 92HR

## 2013-12-16 NOTE — ED Provider Notes (Signed)
CSN: [id number]     Arrival date & time 12/16/13  1609 History   First MD Initiated Contact with Patient 12/16/13 1625     Chief Complaint  Patient presents with  . Shortness of Breath   (Consider location/radiation/quality/duration/timing/severity/associated sxs/prior Treatment) Patient is a 80 y.o. female presenting with shortness of breath. The history is provided by the patient, the nursing home and a relative.  Shortness of Breath  She was sent here for evaluation of shortness of breath with possible need for CT angiogram to evaluate for pulmonary embolus according to her primary care doctor. She was apparently short of breath with O2 saturations in the 80s, today, it improved to 90% with nasal cannula oxygen. She's not had any chest pain. She is in rehabilitation because of an infection in her back. She has been essentially bedbound, due to back pain. She denies swelling or pain in her legs. She's been eating well. She is using her usual medications, without relief. There are no other known modifying factors.   Past Medical History  Diagnosis Date  . Hypertension   . Gout   . Hypercholesteremia   . Diabetes mellitus    Past Surgical History  Procedure Laterality Date  . Cesarean section    . Rotator cuff repair    . Tonsillectomy    . Tubal ligation     Family History  Problem Relation Age of Onset  . Hypertension Mother   . Hypertension Father   . Hypertension Daughter    History  Substance Use Topics  . Smoking status: Former Smoker    Quit date: 12/17/1979  . Smokeless tobacco: Not on file  . Alcohol Use: No   OB History   Grav Para Term Preterm Abortions TAB SAB Ect Mult Living                 Review of Systems  Respiratory: Positive for shortness of breath.   All other systems reviewed and are negative.    Allergies  Motrin  Home Medications   No current outpatient prescriptions on file. BP 190/69  Pulse 94  Temp(Src) 98.3 F (36.8 C) (Oral)   Resp 12  Ht 5\' 1"  (1.549 m)  Wt 231 lb 4.2 oz (104.9 kg)  BMI 43.72 kg/m2  SpO2 97% Physical Exam  Nursing note and vitals reviewed. Constitutional: She appears well-developed. No distress.  Obese, elderly  HENT:  Head: Normocephalic and atraumatic.  Eyes: Conjunctivae and EOM are normal. Pupils are equal, round, and reactive to light.  Neck: Normal range of motion and phonation normal. Neck supple.  Cardiovascular: Normal rate, regular rhythm and intact distal pulses.   JVD is present  Pulmonary/Chest: Effort normal and breath sounds normal. She exhibits no tenderness.  Abdominal: Soft. She exhibits no distension. There is no tenderness. There is no guarding.  Musculoskeletal: Normal range of motion. She exhibits edema ( 2+ lower extremities).  Neurological: She is alert. She exhibits normal muscle tone.  Skin: Skin is warm and dry.  Psychiatric: She has a normal mood and affect. Her behavior is normal.    ED Course  Procedures (including critical care time) Medications  alteplase (CATHFLO ACTIVASE) injection 2 mg (not administered)  alteplase (CATHFLO ACTIVASE) injection 2 mg (not administered)  0.9 %  sodium chloride infusion (10 mL/hr Intravenous New Bag/Given 12/16/13 2300)  traMADol (ULTRAM) tablet 50 mg (not administered)  carvedilol (COREG) tablet 12.5 mg (not administered)  hydrALAZINE (APRESOLINE) tablet 50 mg (not administered)  lidocaine (LIDODERM)  5 % 1 patch (not administered)  simvastatin (ZOCOR) tablet 20 mg (not administered)  furosemide (LASIX) injection 20 mg (not administered)  furosemide (LASIX) injection 40 mg (not administered)  guaiFENesin (MUCINEX) 12 hr tablet 600 mg (not administered)  ipratropium-albuterol (DUONEB) 0.5-2.5 (3) MG/3ML nebulizer solution 3 mL (3 mLs Nebulization Given 12/16/13 2252)  ceFEPIme (MAXIPIME) 1 g in dextrose 5 % 50 mL IVPB (1 g Intravenous Restarted 12/17/13 0002)  oseltamivir (TAMIFLU) capsule 30 mg (not administered)   vancomycin (VANCOCIN) 1,500 mg in sodium chloride 0.9 % 500 mL IVPB (not administered)  ipratropium-albuterol (DUONEB) 0.5-2.5 (3) MG/3ML nebulizer solution 3 mL (3 mLs Nebulization Given 12/16/13 2307)  alteplase (ACTIVASE) injection 2 mg (2 mg Intracatheter Given 12/16/13 1841)  furosemide (LASIX) injection 40 mg (40 mg Intravenous Given 12/16/13 2104)  enoxaparin (LOVENOX) injection 105 mg (105 mg Subcutaneous Given 12/16/13 2241)    Patient Vitals for the past 24 hrs:  BP Temp Temp src Pulse Resp SpO2 Height Weight  12/17/13 0026 190/69 mmHg 98.3 F (36.8 C) Oral 94 12 97 % 5\' 1"  (1.549 m) 231 lb 4.2 oz (104.9 kg)  12/16/13 2307 - - - - - 99 % - -  12/16/13 2300 134/109 mmHg - - 90 14 96 % - -  12/16/13 2253 - - - - - 100 % - -  12/16/13 2215 128/66 mmHg - - 95 19 95 % - -  12/16/13 2145 126/105 mmHg - - 88 18 96 % - -  12/16/13 2130 129/78 mmHg - - 87 19 97 % - -  12/16/13 2045 145/83 mmHg - - 85 14 96 % - -  12/16/13 2000 138/73 mmHg - - 90 20 97 % - -  12/16/13 1900 145/74 mmHg - - 75 13 93 % - -  12/16/13 1845 141/74 mmHg - - 84 15 96 % - -  12/16/13 1730 122/76 mmHg - - 84 25 97 % - -  12/16/13 1700 133/80 mmHg - - 84 14 98 % - -  12/16/13 1622 - - - - - - 5' (1.524 m) 228 lb (103.42 kg)  12/16/13 1617 - - - - - 98 % - -  12/16/13 1615 91/69 mmHg 98.7 F (37.1 C) Oral 88 20 88 % - -    9:00 PM Reevaluation with update and discussion. After initial assessment and treatment, an updated evaluation reveals she remains comfortable. Blood pressure has improved. There are no further complaints. Zhara Gieske L   9:07 PM-Consult complete with Dr Posey Pronto. Patient case explained and discussed. He agrees to admit patient for further evaluation and treatment. Call ended at 2115  Labs Review Labs Reviewed  CBC WITH DIFFERENTIAL - Abnormal; Notable for the following:    RBC 3.64 (*)    Hemoglobin 9.8 (*)    HCT 30.2 (*)    RDW 15.7 (*)    All other components within normal limits   BASIC METABOLIC PANEL - Abnormal; Notable for the following:    Sodium 133 (*)    Chloride 95 (*)    Glucose, Bld 154 (*)    BUN 39 (*)    Creatinine, Ser 1.69 (*)    GFR calc non Af Amer 27 (*)    GFR calc Af Amer 32 (*)    All other components within normal limits  URINALYSIS, ROUTINE W REFLEX MICROSCOPIC - Abnormal; Notable for the following:    APPearance CLOUDY (*)    All other components within normal limits  PRO  B NATRIURETIC PEPTIDE - Abnormal; Notable for the following:    Pro B Natriuretic peptide (BNP) 11565.0 (*)    All other components within normal limits  D-DIMER, QUANTITATIVE - Abnormal; Notable for the following:    D-Dimer, Quant 1.61 (*)    All other components within normal limits  POCT I-STAT 3, BLOOD GAS (G3+) - Abnormal; Notable for the following:    Bicarbonate 26.0 (*)    All other components within normal limits  URINE CULTURE  CULTURE, EXPECTORATED SPUTUM-ASSESSMENT  GRAM STAIN  CULTURE, BLOOD (ROUTINE X 2)  CULTURE, BLOOD (ROUTINE X 2)  MRSA PCR SCREENING  TROPONIN I  STREP PNEUMONIAE URINARY ANTIGEN  LEGIONELLA ANTIGEN, URINE  COMPREHENSIVE METABOLIC PANEL  CBC WITH DIFFERENTIAL  VANCOMYCIN, RANDOM  BLOOD GAS, ARTERIAL  INFLUENZA PANEL BY PCR (TYPE A & B, H1N1)   Imaging Review Dg Chest Port 1 View  12/16/2013   CLINICAL DATA:  Shortness of breath.  EXAM: PORTABLE CHEST - 1 VIEW  COMPARISON:  11/25/2013  FINDINGS: PICC tip is at the cavoatrial junction. There is diffuse increased interstitial accentuation bilaterally which may represent pulmonary edema or pneumonitis. Increased density at the right lung base may represent a small effusion.  Severe arthritic changes seen at both shoulders.  Pulmonary vascularity is within normal limits. Heart size is at the upper limits of normal.  IMPRESSION: Progressive accentuation of interstitial markings bilaterally which may represent pulmonary edema or pneumonitis.   Electronically Signed   By: Rozetta Nunnery  M.D.   On: 12/16/2013 17:28    EKG Interpretation    Date/Time:  Wednesday December 16 2013 16:13:25 EST Ventricular Rate:  87 PR Interval:  165 QRS Duration: 92 QT Interval:  338 QTC Calculation: 407 R Axis:     Text Interpretation:  Sinus rhythm Anterior infarct, old Since last tracing artifact has resolved. No acute abnormalities Confirmed by Eulis Foster  MD, Lonni Dirden ([id number]) on 12/16/2013 5:36:31 PM            MDM   1. Congestive heart failure   2. Hypoxia      Shortness of breath, most likely secondary to congestive heart failure. Cannot rule out PE, at this time. Her creatinine is elevated and GFR is low. She therefore needs to be screened for PE, with a nuclear medicine study. This is not currently available for a nonacute situation. She will be treated with Lovenox pending nuclear medicine study tomorrow. She will be admitted for further evaluation and treatment   Nursing Notes Reviewed/ Care Coordinated, and agree without changes. Applicable Imaging Reviewed.  Interpretation of Laboratory Data incorporated into ED treatment    Richarda Blade, MD 12/17/13 0040

## 2013-12-16 NOTE — ED Notes (Signed)
Donnie from IV team on her way to flush the patients PICC line with TPA.

## 2013-12-17 DIAGNOSIS — J9600 Acute respiratory failure, unspecified whether with hypoxia or hypercapnia: Secondary | ICD-10-CM

## 2013-12-17 DIAGNOSIS — M869 Osteomyelitis, unspecified: Secondary | ICD-10-CM

## 2013-12-17 DIAGNOSIS — J189 Pneumonia, unspecified organism: Principal | ICD-10-CM

## 2013-12-17 DIAGNOSIS — M7989 Other specified soft tissue disorders: Secondary | ICD-10-CM

## 2013-12-17 DIAGNOSIS — I509 Heart failure, unspecified: Secondary | ICD-10-CM

## 2013-12-17 LAB — COMPREHENSIVE METABOLIC PANEL
ALT: 56 U/L — ABNORMAL HIGH (ref 0–35)
AST: 43 U/L — ABNORMAL HIGH (ref 0–37)
Albumin: 2.9 g/dL — ABNORMAL LOW (ref 3.5–5.2)
Alkaline Phosphatase: 102 U/L (ref 39–117)
BUN: 41 mg/dL — ABNORMAL HIGH (ref 6–23)
CO2: 25 meq/L (ref 19–32)
CREATININE: 1.73 mg/dL — AB (ref 0.50–1.10)
Calcium: 10.6 mg/dL — ABNORMAL HIGH (ref 8.4–10.5)
Chloride: 92 mEq/L — ABNORMAL LOW (ref 96–112)
GFR calc Af Amer: 31 mL/min — ABNORMAL LOW (ref >90)
GFR, EST NON AFRICAN AMERICAN: 26 mL/min — AB (ref >90)
Glucose, Bld: 143 mg/dL — ABNORMAL HIGH (ref 70–99)
Potassium: 4 mEq/L (ref 3.7–5.3)
Sodium: 130 mEq/L — ABNORMAL LOW (ref 137–147)
Total Protein: 6.4 g/dL (ref 6.0–8.3)

## 2013-12-17 LAB — SODIUM, URINE, RANDOM: SODIUM UR: 63 meq/L

## 2013-12-17 LAB — LEGIONELLA ANTIGEN, URINE: Legionella Antigen, Urine: NEGATIVE

## 2013-12-17 LAB — URINE CULTURE
Colony Count: NO GROWTH
Culture: NO GROWTH

## 2013-12-17 LAB — GLUCOSE, CAPILLARY
GLUCOSE-CAPILLARY: 135 mg/dL — AB (ref 70–99)
Glucose-Capillary: 120 mg/dL — ABNORMAL HIGH (ref 70–99)
Glucose-Capillary: 138 mg/dL — ABNORMAL HIGH (ref 70–99)
Glucose-Capillary: 153 mg/dL — ABNORMAL HIGH (ref 70–99)

## 2013-12-17 LAB — CBC WITH DIFFERENTIAL/PLATELET
BASOS ABS: 0.1 10*3/uL (ref 0.0–0.1)
Basophils Relative: 1 % (ref 0–1)
EOS ABS: 0.3 10*3/uL (ref 0.0–0.7)
Eosinophils Relative: 4 % (ref 0–5)
HCT: 29.8 % — ABNORMAL LOW (ref 36.0–46.0)
Hemoglobin: 9.7 g/dL — ABNORMAL LOW (ref 12.0–15.0)
Lymphocytes Relative: 14 % (ref 12–46)
Lymphs Abs: 1.2 10*3/uL (ref 0.7–4.0)
MCH: 27.3 pg (ref 26.0–34.0)
MCHC: 32.6 g/dL (ref 30.0–36.0)
MCV: 83.9 fL (ref 78.0–100.0)
Monocytes Absolute: 0.7 10*3/uL (ref 0.1–1.0)
Monocytes Relative: 8 % (ref 3–12)
NEUTROS PCT: 73 % (ref 43–77)
Neutro Abs: 6.2 10*3/uL (ref 1.7–7.7)
PLATELETS: 193 10*3/uL (ref 150–400)
RBC: 3.55 MIL/uL — AB (ref 3.87–5.11)
RDW: 15.6 % — ABNORMAL HIGH (ref 11.5–15.5)
WBC: 8.6 10*3/uL (ref 4.0–10.5)

## 2013-12-17 LAB — INFLUENZA PANEL BY PCR (TYPE A & B)
H1N1 flu by pcr: NOT DETECTED
Influenza A By PCR: NEGATIVE
Influenza B By PCR: NEGATIVE

## 2013-12-17 LAB — MRSA PCR SCREENING: MRSA by PCR: NEGATIVE

## 2013-12-17 LAB — STREP PNEUMONIAE URINARY ANTIGEN: Strep Pneumo Urinary Antigen: NEGATIVE

## 2013-12-17 LAB — VANCOMYCIN, RANDOM: VANCOMYCIN RM: 21.1 ug/mL

## 2013-12-17 MED ORDER — ENOXAPARIN SODIUM 30 MG/0.3ML ~~LOC~~ SOLN
30.0000 mg | SUBCUTANEOUS | Status: DC
  Administered 2013-12-17 – 2013-12-20 (×4): 30 mg via SUBCUTANEOUS
  Filled 2013-12-17 (×5): qty 0.3

## 2013-12-17 MED ORDER — FUROSEMIDE 10 MG/ML IJ SOLN
INTRAMUSCULAR | Status: AC
  Filled 2013-12-17: qty 4

## 2013-12-17 MED ORDER — ADULT MULTIVITAMIN W/MINERALS CH
1.0000 | ORAL_TABLET | Freq: Every day | ORAL | Status: DC
  Administered 2013-12-17 – 2013-12-21 (×5): 1 via ORAL
  Filled 2013-12-17 (×5): qty 1

## 2013-12-17 MED ORDER — ENSURE COMPLETE PO LIQD
237.0000 mL | Freq: Two times a day (BID) | ORAL | Status: DC
  Administered 2013-12-17 – 2013-12-21 (×6): 237 mL via ORAL
  Filled 2013-12-17 (×5): qty 237

## 2013-12-17 MED ORDER — OSELTAMIVIR PHOSPHATE 30 MG PO CAPS
30.0000 mg | ORAL_CAPSULE | Freq: Every day | ORAL | Status: AC
  Administered 2013-12-18 – 2013-12-20 (×3): 30 mg via ORAL
  Filled 2013-12-17 (×3): qty 1

## 2013-12-17 MED ORDER — CYCLOBENZAPRINE HCL 10 MG PO TABS
10.0000 mg | ORAL_TABLET | Freq: Once | ORAL | Status: AC
  Administered 2013-12-18: 10 mg via ORAL
  Filled 2013-12-17: qty 1

## 2013-12-17 NOTE — Progress Notes (Signed)
ANTIBIOTIC CONSULT NOTE - Follow up  Pharmacy Consult for Vancomycin and Cefepime (renal dose adjustment) Indication: pneumonia  Allergies  Allergen Reactions  . Motrin [Ibuprofen] Hives    Patient Measurements: Height: 5\' 1"  (154.9 cm) Weight: 231 lb 4.2 oz (104.9 kg) IBW/kg (Calculated) : 47.8  Labs:  Recent Labs  12/16/13 1812 12/16/13 2021 12/17/13 0128  WBC  --  8.2 8.6  HGB  --  9.8* 9.7*  PLT  --  199 193  CREATININE 1.69*  --  1.73*    Microbiology: Recent Results (from the past 720 hour(s))  CULTURE, BLOOD (ROUTINE X 2)     Status: None   Collection Time    11/20/13  7:35 AM      Result Value Range Status   Specimen Description BLOOD LEFT ARM   Final   Special Requests BOTTLES DRAWN AEROBIC AND ANAEROBIC 5CC   Final   Culture  Setup Time     Final   Value: 11/20/2013 12:52     Performed at [facility]   Culture     Final   Value: NO GROWTH 5 DAYS     Performed at [facility]   Report Status 11/26/2013 FINAL   Final  CULTURE, BLOOD (ROUTINE X 2)     Status: None   Collection Time    11/20/13  7:50 AM      Result Value Range Status   Specimen Description BLOOD RIGHT ARM   Final   Special Requests BOTTLES DRAWN AEROBIC AND ANAEROBIC 5CC   Final   Culture  Setup Time     Final   Value: 11/20/2013 12:51     Performed at [facility]   Culture     Final   Value: STAPHYLOCOCCUS SPECIES (COAGULASE NEGATIVE)     Note: THE SIGNIFICANCE OF ISOLATING THIS ORGANISM FROM A SINGLE SET OF BLOOD CULTURES WHEN MULTIPLE SETS ARE DRAWN IS UNCERTAIN. PLEASE NOTIFY THE MICROBIOLOGY DEPARTMENT WITHIN ONE WEEK IF SPECIATION AND SENSITIVITIES ARE REQUIRED.     Note: Gram Stain Report Called to,Read Back By and Verified With: KELLY CORUM 11/21/13 @ 1:27PM BY RUSCOE A.     Performed at [facility]   Report Status 11/22/2013 FINAL   Final  URINE CULTURE     Status: None   Collection Time    11/20/13 10:07 AM      Result Value Range Status    Specimen Description URINE, CLEAN CATCH   Final   Special Requests Normal   Final   Culture  Setup Time     Final   Value: 11/20/2013 15:11     Performed at [facility]     Final   Value: 30,000 COLONIES/ML     Performed at [facility]   Culture     Final   Value: Multiple bacterial morphotypes present, none predominant. Suggest appropriate recollection if clinically indicated.     Performed at [facility]   Report Status 11/21/2013 FINAL   Final  AFB CULTURE WITH SMEAR     Status: None   Collection Time    11/22/13 11:18 AM      Result Value Range Status   Specimen Description ABSCESS BACK   Final   Special Requests T 10 T 11   Final   ACID FAST SMEAR     Final   Value: NO ACID FAST BACILLI SEEN     Performed at [facility]   Culture     Final   Value: CULTURE WILL BE EXAMINED FOR 6 WEEKS BEFORE ISSUING A FINAL REPORT     Performed at [facility]   Report Status PENDING   Incomplete  ANAEROBIC CULTURE     Status: None   Collection Time    11/22/13 11:18 AM      Result Value Range Status   Specimen Description ABSCESS BACK   Final   Special Requests T 10 T 11   Final   Gram Stain     Final   Value: NO WBC SEEN     NO SQUAMOUS EPITHELIAL CELLS SEEN     NO ORGANISMS SEEN     Performed at [facility]   Culture     Final   Value: NO ANAEROBES ISOLATED     Performed at [facility]   Report Status 11/27/2013 FINAL   Final  FUNGUS CULTURE W SMEAR     Status: None   Collection Time    11/22/13 11:18 AM      Result Value Range Status   Specimen Description ABSCESS BACK   Final   Special Requests T 10 T 11   Final   Fungal Smear     Final   Value: NO YEAST OR FUNGAL ELEMENTS SEEN     Performed at [facility]   Culture     Final   Value: CULTURE IN PROGRESS FOR FOUR WEEKS     Performed at [facility]   Report Status PENDING   Incomplete  CULTURE, ROUTINE-ABSCESS     Status: None    Collection Time    11/22/13 11:19 AM      Result Value Range Status   Specimen Description ABSCESS BACK   Final   Special Requests T 10 T 11   Final   Gram Stain     Final   Value: RARE WBC PRESENT,BOTH PMN AND MONONUCLEAR     NO SQUAMOUS EPITHELIAL CELLS SEEN     NO ORGANISMS SEEN     Performed at [facility]   Culture     Final   Value: NO GROWTH 3 DAYS     Performed at [facility]   Report Status 11/25/2013 FINAL   Final  CULTURE, BLOOD (ROUTINE X 2)     Status: None   Collection Time    11/23/13  2:55 PM      Result Value Range Status   Specimen Description BLOOD LEFT HAND   Final   Special Requests BOTTLES DRAWN AEROBIC ONLY 10CC   Final   Culture  Setup Time     Final   Value: 11/23/2013 20:12     Performed at [facility]   Culture     Final   Value: NO GROWTH 5 DAYS     Performed at [facility]   Report Status 11/29/2013 FINAL   Final  CULTURE, BLOOD (ROUTINE X 2)     Status: None   Collection Time    11/23/13  3:05 PM      Result Value Range Status   Specimen Description BLOOD LEFT HAND   Final   Special Requests BOTTLES DRAWN AEROBIC ONLY 5CC   Final   Culture  Setup Time     Final   Value: 11/23/2013 20:10     Performed at [facility]   Culture     Final   Value: NO  GROWTH 5 DAYS     Performed at [facility]   Report Status 11/29/2013 FINAL   Final  URINE CULTURE     Status: None   Collection Time    12/16/13  5:47 PM      Result Value Range Status   Specimen Description URINE, CATHETERIZED   Final   Special Requests NONE   Final   Culture  Setup Time     Final   Value: 12/16/2013 19:12     Performed at [facility]     Final   Value: NO GROWTH     Performed at [facility]   Culture     Final   Value: NO GROWTH     Performed at [facility]   Report Status 12/17/2013 FINAL   Final  MRSA PCR SCREENING     Status: None   Collection Time    12/17/13 12:27  AM      Result Value Range Status   MRSA by PCR NEGATIVE  NEGATIVE Final   Comment:            The GeneXpert MRSA Assay (FDA     approved for NASAL specimens     only), is one component of a     comprehensive MRSA colonization     surveillance program. It is not     intended to diagnose MRSA     infection nor to guide or     monitor treatment for     MRSA infections.    Medical History: Past Medical History  Diagnosis Date  . Hypertension   . Gout   . Hypercholesteremia   . Diabetes mellitus     Assessment: 80 year old female on vancomycin and cefepime for pneumonia. Patient with CKD stage 4.  SCr increased to 1.73, estimated CrCl ~ 28 ml/min.  Hydration continues.  Goal of Therapy:  Vancomycin trough level 15-20 mcg/ml Appropriate cefepime dosing  Plan:  1) Decrease Cefepime to 1 Gram IV q24 hours 2) Continue Vancomycin 1500 mg IV q48 hours 3) Follow up Scr, fever curve, cultures, steady state vancomycin trough around 3rd or 4th dose.   Thank you. Nicole Cella, RPh Clinical Pharmacist Pager: [phone] 12/17/2013,4:41 PM

## 2013-12-17 NOTE — ED Notes (Signed)
Dr. Posey Pronto made aware that only 2 set of blood cultures could be drawn and reports it ok to start abx.

## 2013-12-17 NOTE — Progress Notes (Signed)
  Echocardiogram 2D Echocardiogram has been performed.  [name] [name] FRANCES 12/17/2013, 5:54 PM

## 2013-12-17 NOTE — Care Management Note (Signed)
    Page 1 of 1   12/17/2013     4:12:54 PM   CARE MANAGEMENT NOTE 12/17/2013  Patient:  [name], [name]   Account Number:  [id number]  Date Initiated:  12/17/2013  Documentation initiated by:  Fuller Mandril  Subjective/Objective Assessment:   80 y.o. female with PMHx of HTN, glaucoma hypercholesterolemia, diabetes mellitus, chronic kidney disease, recent thoracic osteomyelitis. Admitted with Acute Resp Failure with hypoxia//From [facility]     Action/Plan:   IV abx, step down unit. DuoNeb as needed and BiPAP as needed  I will keep her n.p.o. and get swallowing evaluation//Return to [facility]   Anticipated DC Date:  12/20/2013   Anticipated DC Plan:  SKILLED NURSING FACILITY  In-house referral  Clinical Social Worker      DC Planning Services  CM consult      Choice offered to / List presented to:             Status of service:   Medicare Important Message given?   (If response is "NO", the following Medicare IM given date fields will be blank) Date Medicare IM given:   Date Additional Medicare IM given:    Discharge Disposition:    Per UR Regulation:    If discussed at Long Length of Stay Meetings, dates discussed:    Comments:  12/17/13 [place], RN, BSN.NCM [phone] Pt from [facility].  Plans to return upon discharge. Crawford Givens, LCSW notified of admission.

## 2013-12-17 NOTE — Progress Notes (Signed)
[facility] Progress Note [place] TEAM 1 - Stepdown/ICU TEAM   [name] [name] XLK:[id number] DOB: Jul 29, 1930 DOA: 12/16/2013 PCP: No PCP Per Patient  Brief narrative: [name] [name] is a 80 y.o. female with Past medical history of hypertension, glaucoma hypercholesterolemia, diabetes mellitus, chronic kidney disease, recent thoracic osteomyelitis who lives in SNF.  The patient presented with complaints of cough and shortness of breath that has been ongoing since last few days. She was recently admitted for thoracic osteomyelitis and was discharged on IV vancomycin and IV ceftriaxone. In the nursing home 3 days ago she was found to have worsening cough and shortness of breath with yellowish sputum production without any blood and progressively worsening generalized weakness. She was started on azithromycin as an added antibiotic to her regimen. Despite that her breathing was progressively worsening and today she was short of breath even at rest and therefore they brought her here as she was also found to be hypoxic and high 80s on room air. The daughter has noted that she has been wheezing more frequently.  She also had some chills.  That is a suspicion that she might have choked up on her meal by the nurse practitioner in SNF   Subjective: Feeling about the same as yesterday but does tell me that her cough has resolved.   Assessment/Plan: Principal Problem:   Acute respiratory failure with hypoxia  - cont Vanc and Cefepime for HCAP - influenza negative -cont Lasix for possible CHF  f/u on ECHO   Active Problems:    CKD (chronic kidney disease) stage 4, GFR 15-29 ml/min - cont to follow with hydration    Osteomyelitis of thoracic spine - cont Vancomycin- on Cefepime instead of Rocephin    Edema - lasix as above    Hypertension - reasonably controlled- follow    Code Status: Full code  Family Communication: none Disposition Plan: follow in  SDU  Consultants: none  Procedures: none  Antibiotics: Vancomycin  Cefepime- 1/28  DVT prophylaxis: Lovenox  Objective: Blood pressure 112/72, pulse 82, temperature 98.2 F (36.8 C), temperature source Oral, resp. rate 16, height 5\' 1"  (1.549 m), weight 104.9 kg (231 lb 4.2 oz), SpO2 98.00%.  Intake/Output Summary (Last 24 hours) at 12/17/13 1545 Last data filed at 12/17/13 0800  Gross per 24 hour  Intake      0 ml  Output    475 ml  Net   -475 ml     Exam: General: AAO x 3 - No acute respiratory distress Lungs:  Crackles at bases Cardiovascular: Regular rate and rhythm without murmur gallop or rub normal S1 and S2 Abdomen: Nontender, nondistended, soft, bowel sounds positive, no rebound, no ascites, no appreciable mass Extremities: No significant cyanosis, clubbing- +1 pitting edema bilateral lower extremities  Data Reviewed: Basic Metabolic Panel:  Recent Labs Lab 12/16/13 1812 12/17/13 0128  NA 133* 130*  K 4.5 4.0  CL 95* 92*  CO2 21 25  GLUCOSE 154* 143*  BUN 39* 41*  CREATININE 1.69* 1.73*  CALCIUM 10.3 10.6*   Liver Function Tests:  Recent Labs Lab 12/17/13 0128  AST 43*  ALT 56*  ALKPHOS 102  BILITOT <0.2*  PROT 6.4  ALBUMIN 2.9*   No results found for this basename: LIPASE, AMYLASE,  in the last 168 hours No results found for this basename: AMMONIA,  in the last 168 hours CBC:  Recent Labs Lab 12/16/13 2021 12/17/13 0128  WBC 8.2 8.6  NEUTROABS 5.8 6.2  HGB  9.8* 9.7*  HCT 30.2* 29.8*  MCV 83.0 83.9  PLT 199 193   Cardiac Enzymes:  Recent Labs Lab 12/16/13 1812  TROPONINI <0.30   BNP (last 3 results)  Recent Labs  11/20/13 0530 11/25/13 0535 12/16/13 1812  PROBNP 894.4* 1142.0* 11565.0*   CBG:  Recent Labs Lab 12/17/13 0827 12/17/13 1158  GLUCAP 120* 135*    Recent Results (from the past 240 hour(s))  URINE CULTURE     Status: None   Collection Time    12/16/13  5:47 PM      Result Value Range Status    Specimen Description URINE, CATHETERIZED   Final   Special Requests NONE   Final   Culture  Setup Time     Final   Value: 12/16/2013 19:12     Performed at [place]     Final   Value: NO GROWTH     Performed at [facility]   Culture     Final   Value: NO GROWTH     Performed at [facility]   Report Status 12/17/2013 FINAL   Final  MRSA PCR SCREENING     Status: None   Collection Time    12/17/13 12:27 AM      Result Value Range Status   MRSA by PCR NEGATIVE  NEGATIVE Final   Comment:            The GeneXpert MRSA Assay (FDA     approved for NASAL specimens     only), is one component of a     comprehensive MRSA colonization     surveillance program. It is not     intended to diagnose MRSA     infection nor to guide or     monitor treatment for     MRSA infections.     Studies:  Recent x-ray studies have been reviewed in detail by the Attending Physician  Scheduled Meds:  Scheduled Meds: . alteplase  2 mg Intracatheter Once  . carvedilol  12.5 mg Oral BID WC  . ceFEPime (MAXIPIME) IV  1 g Intravenous Q12H  . furosemide  40 mg Intravenous Q12H  . guaiFENesin  600 mg Oral BID  . hydrALAZINE  50 mg Oral TID  . ipratropium-albuterol  3 mL Nebulization Q4H  . lidocaine  1 patch Transdermal Q24H  . oseltamivir  30 mg Oral BID  . simvastatin  20 mg Oral q1800  . vancomycin  1,500 mg Intravenous Q48H   Continuous Infusions:   Time spent on care of this patient: > 35 min   Debbe Odea, MD  [facility] Office  [phone] Pager - Text Page per Shea Evans as per below:  On-Call/Text Page:      [email or web address]      password [email or web address]  If 7PM-7AM, please contact night-coverage [email or web address] Password [email or web address] 12/17/2013, 3:45 PM   LOS: 1 day

## 2013-12-17 NOTE — Progress Notes (Addendum)
INITIAL NUTRITION ASSESSMENT  DOCUMENTATION CODES Per approved criteria  -Morbid Obesity   INTERVENTION: 1. Ensure Complete po BID, each supplement provides 350 kcal and 13 grams of protein Strawberry flavor  2. MVI daily  NUTRITION DIAGNOSIS: Inadequate oral intake related to poor appetite/infection as evidenced by meal completion <50%.   Goal: Pt to meet >/= 90% of their estimated nutrition needs   Monitor:  PO intake, supplement acceptance, weight trend, labs  Reason for Assessment: Pt identified as at nutrition risk on the Malnutrition Screen Tool  80 y.o. female  Admitting Dx: Acute respiratory failure with hypoxia  ASSESSMENT: Pt admitted with healthcare associated PNA, flu negative. Pt also with chronic CHF per pt her weight is stable. BNP 11565 on admission. Pt recently hospitalized for osteomyelitis of her thoracic spine and has been on IV antibiotics. Per RN pt's intake has been very poor today. Per pt her grandson helped her eat lunch but consumed < 50% of the meal. Per her report her appetite has been poor since the beginning of the year. Pt likes strawberry boost and is willing to try ensure compete in strawberry.  Nutrition-focused physical exam is WNL.   Height: Ht Readings from Last 1 Encounters:  12/17/13 5\' 1"  (1.549 m)    Weight: Wt Readings from Last 1 Encounters:  12/17/13 231 lb 4.2 oz (104.9 kg)    Ideal Body Weight: 47.7 kg   % Ideal Body Weight: 220%  Wt Readings from Last 10 Encounters:  12/17/13 231 lb 4.2 oz (104.9 kg)  12/16/13 228 lb (103.42 kg)  12/10/13 228 lb (103.42 kg)  11/27/13 228 lb (103.42 kg)  11/20/13 228 lb 6.3 oz (103.6 kg)    Usual Body Weight: 228 lb   % Usual Body Weight: >100%  BMI:  Body mass index is 43.72 kg/(m^2).  Estimated Nutritional Needs: Kcal: 1700-1900 Protein: 85-95 grams Fluid: > 1.5 L/day  Skin: skin tear  Diet Order: Cardiac Meal Completion: <50%  EDUCATION NEEDS: -No education needs  identified at this time   Intake/Output Summary (Last 24 hours) at 12/17/13 0923 Last data filed at 12/17/13 0800  Gross per 24 hour  Intake      0 ml  Output    475 ml  Net   -475 ml    Last BM: 1/27   Labs:   Recent Labs Lab 12/16/13 1812 12/17/13 0128  NA 133* 130*  K 4.5 4.0  CL 95* 92*  CO2 21 25  BUN 39* 41*  CREATININE 1.69* 1.73*  CALCIUM 10.3 10.6*  GLUCOSE 154* 143*    CBG (last 3)   Recent Labs  12/17/13 0827  GLUCAP 120*    Scheduled Meds: . sodium chloride   Intravenous STAT  . alteplase  2 mg Intracatheter Once  . carvedilol  12.5 mg Oral BID WC  . ceFEPime (MAXIPIME) IV  1 g Intravenous Q12H  . furosemide  40 mg Intravenous Q12H  . guaiFENesin  600 mg Oral BID  . hydrALAZINE  50 mg Oral TID  . ipratropium-albuterol  3 mL Nebulization Q4H  . lidocaine  1 patch Transdermal Q24H  . oseltamivir  30 mg Oral BID  . simvastatin  20 mg Oral q1800  . vancomycin  1,500 mg Intravenous Q48H    Continuous Infusions:   Past Medical History  Diagnosis Date  . Hypertension   . Gout   . Hypercholesteremia   . Diabetes mellitus     Past Surgical History  Procedure Laterality Date  .  Cesarean section    . Rotator cuff repair    . Tonsillectomy    . Tubal ligation      [place], [place], [place] Pager [phone] After Hours Pager

## 2013-12-17 NOTE — Evaluation (Signed)
Clinical/Bedside Swallow Evaluation Patient Details  Name: [name] [name] MRN: [id number] Date of Birth: [birth year]  Today's Date: 12/17/2013 Time: 0932-6712 SLP Time Calculation (min): 21 min  Past Medical History:  Past Medical History  Diagnosis Date  . Hypertension   . Gout   . Hypercholesteremia   . Diabetes mellitus    Past Surgical History:  Past Surgical History  Procedure Laterality Date  . Cesarean section    . Rotator cuff repair    . Tonsillectomy    . Tubal ligation     HPI:  80 y.o. female with Past medical history of hypertension, glaucoma hypercholesterolemia, diabetes mellitus, chronic kidney disease, recent thoracic osteomyelitis. The patient is coming from SNF, presenting with complaints of cough and shortness of breath that has been ongoing since last few days. She was recently admitted for thoracic osteomyelitis and was discharged on IV vancomycin and IV ceftriaxone. In the nursing home 3 days ago she was found to have worsening cough and shortness of breath with yellowish sputum production without any blood and progressively worsening generalized weakness.  CXR Progressive accentuation of interstitial markings bilaterally which may represent pulmonary edema or pneumonitis.  There is a suspicion that she might have choked up on her meal by the nurse practitioner in SNF    Assessment / Plan / Recommendation Clinical Impression  Pt. presents with intermittent wet vocal quality ("phlegm-like") and clears with cough.  No indications of penetration or aspiration during observation and do no suspect pna is due to oropharyngeal dysphagia.  Current pna, however places her at mildly increased risk due to SOB and premature abduction of vocal cords.  SLP explained clinical reasoning/rationale for small sips and rest breaks if needed.  Recommend continue regular texture diet and thin liquids, straws okay and pills one at a time with thin if small (whole in applesauce if large).   No further ST needed.    Aspiration Risk  Moderate    Diet Recommendation Regular;Thin liquid   Liquid Administration via: Cup;Straw Medication Administration: Whole meds with liquid Supervision: Patient able to self feed;Intermittent supervision to cue for compensatory strategies Compensations: Slow rate;Small sips/bites (rest breaks) Postural Changes and/or Swallow Maneuvers: Seated upright 90 degrees;Upright 30-60 min after meal    Other  Recommendations Oral Care Recommendations: Oral care BID   Follow Up Recommendations  None    Frequency and Duration        Pertinent Vitals/Pain WDL         Swallow Study         Oral/Motor/Sensory Function Overall Oral Motor/Sensory Function: Appears within functional limits for tasks assessed   Ice Chips Ice chips: Within functional limits   Thin Liquid Thin Liquid: Within functional limits Presentation: Cup;Straw    Nectar Thick Nectar Thick Liquid: Not tested   Honey Thick Honey Thick Liquid: Not tested   Puree Puree: Within functional limits   Solid   GO    Solid: Within functional limits       Houston Siren M.Ed [employer] [phone]  12/17/2013

## 2013-12-18 ENCOUNTER — Inpatient Hospital Stay (HOSPITAL_COMMUNITY): Payer: Medicare HMO

## 2013-12-18 LAB — CBC
HCT: 26.8 % — ABNORMAL LOW (ref 36.0–46.0)
HEMOGLOBIN: 8.9 g/dL — AB (ref 12.0–15.0)
MCH: 27.6 pg (ref 26.0–34.0)
MCHC: 33.2 g/dL (ref 30.0–36.0)
MCV: 83.2 fL (ref 78.0–100.0)
Platelets: 218 10*3/uL (ref 150–400)
RBC: 3.22 MIL/uL — ABNORMAL LOW (ref 3.87–5.11)
RDW: 15.8 % — ABNORMAL HIGH (ref 11.5–15.5)
WBC: 6.8 10*3/uL (ref 4.0–10.5)

## 2013-12-18 LAB — GLUCOSE, CAPILLARY
GLUCOSE-CAPILLARY: 151 mg/dL — AB (ref 70–99)
Glucose-Capillary: 114 mg/dL — ABNORMAL HIGH (ref 70–99)
Glucose-Capillary: 152 mg/dL — ABNORMAL HIGH (ref 70–99)
Glucose-Capillary: 142 mg/dL — ABNORMAL HIGH (ref 70–99)

## 2013-12-18 LAB — FUNGUS CULTURE W SMEAR: Fungal Smear: NONE SEEN

## 2013-12-18 LAB — BASIC METABOLIC PANEL
BUN: 44 mg/dL — ABNORMAL HIGH (ref 6–23)
CALCIUM: 10.2 mg/dL (ref 8.4–10.5)
CO2: 26 mEq/L (ref 19–32)
Chloride: 98 mEq/L (ref 96–112)
Creatinine, Ser: 1.68 mg/dL — ABNORMAL HIGH (ref 0.50–1.10)
GFR calc non Af Amer: 27 mL/min — ABNORMAL LOW (ref >90)
GFR, EST AFRICAN AMERICAN: 32 mL/min — AB (ref >90)
Glucose, Bld: 129 mg/dL — ABNORMAL HIGH (ref 70–99)
POTASSIUM: 4.1 meq/L (ref 3.7–5.3)
SODIUM: 137 meq/L (ref 137–147)

## 2013-12-18 MED ORDER — IPRATROPIUM-ALBUTEROL 0.5-2.5 (3) MG/3ML IN SOLN
3.0000 mL | Freq: Four times a day (QID) | RESPIRATORY_TRACT | Status: DC
  Administered 2013-12-18 – 2013-12-19 (×5): 3 mL via RESPIRATORY_TRACT
  Filled 2013-12-18 (×3): qty 3

## 2013-12-18 MED ORDER — LABETALOL HCL 5 MG/ML IV SOLN
20.0000 mg | INTRAVENOUS | Status: DC | PRN
  Filled 2013-12-18: qty 4

## 2013-12-18 MED ORDER — SODIUM CHLORIDE 0.9 % IJ SOLN
10.0000 mL | INTRAMUSCULAR | Status: DC | PRN
  Administered 2013-12-18 – 2013-12-21 (×7): 10 mL

## 2013-12-18 MED ORDER — SODIUM CHLORIDE 0.9 % IJ SOLN: 10.0000 mL | Freq: Two times a day (BID) | INTRAMUSCULAR | Status: DC

## 2013-12-18 MED ORDER — DEXTROSE 5 % IV SOLN
1.0000 g | INTRAVENOUS | Status: DC
  Administered 2013-12-19 – 2013-12-21 (×3): 1 g via INTRAVENOUS
  Filled 2013-12-18 (×3): qty 1

## 2013-12-18 MED ORDER — MENTHOL 3 MG MT LOZG
1.0000 | LOZENGE | OROMUCOSAL | Status: DC | PRN
  Administered 2013-12-19: 3 mg via ORAL
  Filled 2013-12-18: qty 9

## 2013-12-18 MED ORDER — SODIUM CHLORIDE 0.9 % IV SOLN
INTRAVENOUS | Status: DC
  Administered 2013-12-18: 14:00:00 via INTRAVENOUS

## 2013-12-18 NOTE — Evaluation (Signed)
Physical Therapy Evaluation Patient Details Name: [name] [name] MRN: [id number] DOB: 27-Nov-1929 Today's Date: 12/18/2013 Time: 1525-1540 PT Time Calculation (min): 15 min  PT Assessment / Plan / Recommendation History of Present Illness  [name] [name] is a 80 y.o. female with Past medical history of hypertension, glaucoma hypercholesterolemia, diabetes mellitus, chronic kidney disease, recent thoracic osteomyelitis who lives in SNF.    Clinical Impression  Pt admitted with above. Pt currently with functional limitations due to the deficits listed below (see PT Problem List).  Pt will benefit from skilled PT to increase their independence and safety with mobility to allow discharge to the venue listed below.       PT Assessment  Patient needs continued PT services    Follow Up Recommendations  SNF    Does the patient have the potential to tolerate intense rehabilitation      Barriers to Discharge        Equipment Recommendations  None recommended by PT    Recommendations for Other Services     Frequency Min 2X/week    Precautions / Restrictions Precautions Precautions: Fall   Pertinent Vitals/Pain Dyspnea 3/4 on RA.  HR 90's. Pain in rt lower back with sitting.      Mobility  Bed Mobility Bed Mobility: Sidelying to Sit;Sit to Sidelying;Rolling Rolling: Max assist Sidelying to sit: +2 for physical assistance;Max assist Sit to sidelying: +2 for physical assistance;Mod assist General bed mobility comments: Assist to bring legs off of bed and elevate trunk.  Assist to bring legs back up into bed.    Exercises     PT Diagnosis: Difficulty walking;Generalized weakness  PT Problem List: Decreased strength;Decreased mobility;Decreased activity tolerance;Decreased balance;Pain;Decreased knowledge of use of DME PT Treatment Interventions: DME instruction;Therapeutic exercise;Gait training;Balance training;Functional mobility training;Therapeutic activities;Patient/family  education     PT Goals(Current goals can be found in the care plan section) Acute Rehab PT Goals Patient Stated Goal: Return home eventually PT Goal Formulation: With patient Time For Goal Achievement: 12/25/13 Potential to Achieve Goals: Fair  Visit Information  Last PT Received On: 12/18/13 Assistance Needed: +2 History of Present Illness: [name] [name] is a 80 y.o. female with Past medical history of hypertension, glaucoma hypercholesterolemia, diabetes mellitus, chronic kidney disease, recent thoracic osteomyelitis who lives in SNF.         Prior Functioning  Home Living Family/patient expects to be discharged to:: [place]: [place] - 2 wheels;Cane - single point Prior Function Level of Independence: Needs assistance Gait / Transfers Assistance Needed: Non ambulatory since hospitalization 3 weeks ago. Assist required for standing and pivot to chair. Comments: history of falls Communication Communication: No difficulties    Cognition  Cognition Arousal/Alertness: Awake/alert Behavior During Therapy: WFL for tasks assessed/performed Overall Cognitive Status: Within Functional Limits for tasks assessed    Extremity/Trunk Assessment Upper Extremity Assessment Upper Extremity Assessment: Generalized weakness Lower Extremity Assessment Lower Extremity Assessment: Generalized weakness   Balance Balance Overall balance assessment: Needs assistance Sitting-balance support: Bilateral upper extremity supported;Feet supported Sitting balance-Leahy Scale: Poor Sitting balance - Comments: Sat EOB x 6-7 minutes with initial assist for balance and then able to maintain with supervision.  Verbal cues to relax and perform pursed lip breathing. Postural control: Posterior lean  End of Session PT - End of Session Activity Tolerance: Patient limited by fatigue Patient left: in bed;with call bell/phone within reach;with family/visitor present Nurse  Communication: Mobility status  GP     [facility] 12/18/2013, 4:27 PM  [employer] PT [phone]

## 2013-12-18 NOTE — Progress Notes (Signed)
[facility] Progress Note  TEAM 1 - Stepdown/ICU TEAM   [name] [name] JYN:[id number] DOB: 1930/02/11 DOA: 12/16/2013 PCP: No PCP Per Patient  Brief narrative: [name] [name] is a 80 y.o. female with Past medical history of hypertension, glaucoma hypercholesterolemia, diabetes mellitus, chronic kidney disease, recent thoracic osteomyelitis who lives in SNF.  The patient presented with complaints of cough and shortness of breath that has been ongoing since last few days. She was recently admitted for thoracic osteomyelitis and was discharged on IV vancomycin and IV ceftriaxone. In the nursing home 3 days ago she was found to have worsening cough and shortness of breath with yellowish sputum production without any blood and progressively worsening generalized weakness. She was started on azithromycin as an added antibiotic to her regimen. Despite that her breathing was progressively worsening and today she was short of breath even at rest and therefore they brought her here as she was also found to be hypoxic and high 80s on room air. The daughter has noted that she has been wheezing more frequently.  She also had some chills.  That is a suspicion that she might have choked up on her meal by the nurse practitioner in SNF   Subjective: C/o back pain which is not a new issue  Assessment/Plan: Principal Problem:   Acute respiratory failure with hypoxia  - cont Vanc and Cefepime for HCAP - influenza negative however as symptoms very consistent with influenza along with a pneumonitis picture on CXR, will continue to treat with Tamiflu  -were treating with Lasix as CXR was consistent with pulmonary edema pattern however, ECHO does not reveal heart failure issues therefore will d/c Lasix - also to be noted, pt's weight had not changed from prior discharge.   Active Problems:    CKD (chronic kidney disease) stage 4, GFR 15-29 ml/min - cont to follow with hydration    Osteomyelitis of  thoracic spine - cont Vancomycin- on Cefepime now instead of Rocephin    Edema - possibly due to venous stasis    Hypertension - BP elevated- will add PRN labetalol   Code Status: Full code  Family Communication: none Disposition Plan: return to SNF  Consultants: none  Procedures: none  Antibiotics: Vancomycin  Cefepime- 1/28  DVT prophylaxis: Lovenox  Objective: Blood pressure 169/74, pulse 93, temperature 98.7 F (37.1 C), temperature source Oral, resp. rate 18, height 5\' 1"  (1.549 m), weight 105.5 kg (232 lb 9.4 oz), SpO2 95.00%.  Intake/Output Summary (Last 24 hours) at 12/18/13 1756 Last data filed at 12/18/13 1637  Gross per 24 hour  Intake    530 ml  Output   1625 ml  Net  -1095 ml     Exam: General: AAO x 3 - No acute respiratory distress Lungs:  CTA b/l  Cardiovascular: Regular rate and rhythm without murmur gallop or rub normal S1 and S2 Abdomen: Nontender, nondistended, soft, bowel sounds positive, no rebound, no ascites, no appreciable mass Extremities: No significant cyanosis, clubbing- no further pitting edema bilateral lower extremities  Data Reviewed: Basic Metabolic Panel:  Recent Labs Lab 12/16/13 1812 12/17/13 0128 12/18/13 0450  NA 133* 130* 137  K 4.5 4.0 4.1  CL 95* 92* 98  CO2 21 25 26   GLUCOSE 154* 143* 129*  BUN 39* 41* 44*  CREATININE 1.69* 1.73* 1.68*  CALCIUM 10.3 10.6* 10.2   Liver Function Tests:  Recent Labs Lab 12/17/13 0128  AST 43*  ALT 56*  ALKPHOS 102  BILITOT <0.2*  PROT 6.4  ALBUMIN 2.9*   No results found for this basename: LIPASE, AMYLASE,  in the last 168 hours No results found for this basename: AMMONIA,  in the last 168 hours CBC:  Recent Labs Lab 12/16/13 2021 12/17/13 0128 12/18/13 0450  WBC 8.2 8.6 6.8  NEUTROABS 5.8 6.2  --   HGB 9.8* 9.7* 8.9*  HCT 30.2* 29.8* 26.8*  MCV 83.0 83.9 83.2  PLT 199 193 218   Cardiac Enzymes:  Recent Labs Lab 12/16/13 1812  TROPONINI <0.30    BNP (last 3 results)  Recent Labs  11/20/13 0530 11/25/13 0535 12/16/13 1812  PROBNP 894.4* 1142.0* 11565.0*   CBG:  Recent Labs Lab 12/17/13 1158 12/17/13 1727 12/17/13 2105 12/18/13 0827 12/18/13 1248  GLUCAP 135* 138* 153* 114* 151*    Recent Results (from the past 240 hour(s))  URINE CULTURE     Status: None   Collection Time    12/16/13  5:47 PM      Result Value Range Status   Specimen Description URINE, CATHETERIZED   Final   Special Requests NONE   Final   Culture  Setup Time     Final   Value: 12/16/2013 19:12     Performed at [facility]     Final   Value: NO GROWTH     Performed at [facility]   Culture     Final   Value: NO GROWTH     Performed at [facility]   Report Status 12/17/2013 FINAL   Final  CULTURE, BLOOD (ROUTINE X 2)     Status: None   Collection Time    12/16/13 11:14 PM      Result Value Range Status   Specimen Description BLOOD PICC LINE   Final   Special Requests BOTTLES DRAWN AEROBIC ONLY 10CC   Final   Culture  Setup Time     Final   Value: 12/17/2013 03:34     Performed at [facility]   Culture     Final   Value:        BLOOD CULTURE RECEIVED NO GROWTH TO DATE CULTURE WILL BE HELD FOR 5 DAYS BEFORE ISSUING A FINAL NEGATIVE REPORT     Performed at [facility]   Report Status PENDING   Incomplete  MRSA PCR SCREENING     Status: None   Collection Time    12/17/13 12:27 AM      Result Value Range Status   MRSA by PCR NEGATIVE  NEGATIVE Final   Comment:            The GeneXpert MRSA Assay (FDA     approved for NASAL specimens     only), is one component of a     comprehensive MRSA colonization     surveillance program. It is not     intended to diagnose MRSA     infection nor to guide or     monitor treatment for     MRSA infections.     Studies:  Recent x-ray studies have been reviewed in detail by the Attending Physician  Scheduled Meds:  Scheduled  Meds: . alteplase  2 mg Intracatheter Once  . carvedilol  12.5 mg Oral BID WC  . [START ON 12/19/2013] ceFEPime (MAXIPIME) IV  1 g Intravenous Q24H  . enoxaparin (LOVENOX) injection  30 mg Subcutaneous Q24H  . feeding supplement (ENSURE COMPLETE)  237 mL Oral BID  BM  . guaiFENesin  600 mg Oral BID  . hydrALAZINE  50 mg Oral TID  . ipratropium-albuterol  3 mL Nebulization Q6H  . lidocaine  1 patch Transdermal Q24H  . multivitamin with minerals  1 tablet Oral Daily  . oseltamivir  30 mg Oral Daily  . simvastatin  20 mg Oral q1800  . sodium chloride  10-40 mL Intracatheter Q12H  . vancomycin  1,500 mg Intravenous Q48H   Continuous Infusions: . sodium chloride 100 mL/hr at 12/18/13 1330    Time spent on care of this patient: > 35 min   Angellee Cohill, MD  [facility] Office  [phone] Pager - Text Page per Shea Evans as per below:  On-Call/Text Page:      [email or web address]      password [email or web address]  If 7PM-7AM, please contact night-coverage [email or web address] Password [email or web address] 12/18/2013, 5:56 PM   LOS: 2 days

## 2013-12-18 NOTE — Clinical Social Work Note (Signed)
Patient from [facility] and can return at discharge per [facility] with admissions at [facility]. Patient is [employer] and will need prior authorization before returning. Physical therapy has been ordered but has not evaluated patient yet. 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 Attending MD advised.  Constance Hackenberg Givens, MSW, LCSW [phone]

## 2013-12-18 NOTE — Progress Notes (Signed)
VASCULAR LAB PRELIMINARY  PRELIMINARY  PRELIMINARY  PRELIMINARY  Bilateral lower extremity venous duplex  completed.    Preliminary report:  Bilateral:  No evidence of DVT, superficial thrombosis, or Baker's Cyst.    Jolyn Deshmukh, RVT 12/18/2013, 2:48 PM

## 2013-12-18 NOTE — Progress Notes (Signed)
Patient c/o sore throat,T. Callahan,NP notified,order received. Ashleah Valtierra Joselita,RN

## 2013-12-19 DIAGNOSIS — N184 Chronic kidney disease, stage 4 (severe): Secondary | ICD-10-CM

## 2013-12-19 LAB — GLUCOSE, CAPILLARY
GLUCOSE-CAPILLARY: 134 mg/dL — AB (ref 70–99)
Glucose-Capillary: 110 mg/dL — ABNORMAL HIGH (ref 70–99)
Glucose-Capillary: 162 mg/dL — ABNORMAL HIGH (ref 70–99)
Glucose-Capillary: 194 mg/dL — ABNORMAL HIGH (ref 70–99)

## 2013-12-19 LAB — BASIC METABOLIC PANEL
BUN: 39 mg/dL — ABNORMAL HIGH (ref 6–23)
CHLORIDE: 98 meq/L (ref 96–112)
CO2: 24 mEq/L (ref 19–32)
Calcium: 10.5 mg/dL (ref 8.4–10.5)
Creatinine, Ser: 1.43 mg/dL — ABNORMAL HIGH (ref 0.50–1.10)
GFR, EST AFRICAN AMERICAN: 39 mL/min — AB (ref >90)
GFR, EST NON AFRICAN AMERICAN: 33 mL/min — AB (ref >90)
Glucose, Bld: 125 mg/dL — ABNORMAL HIGH (ref 70–99)
POTASSIUM: 4 meq/L (ref 3.7–5.3)
Sodium: 137 mEq/L (ref 137–147)

## 2013-12-19 LAB — CBC
HCT: 28.7 % — ABNORMAL LOW (ref 36.0–46.0)
HEMOGLOBIN: 9.6 g/dL — AB (ref 12.0–15.0)
MCH: 27.7 pg (ref 26.0–34.0)
MCHC: 33.4 g/dL (ref 30.0–36.0)
MCV: 82.9 fL (ref 78.0–100.0)
Platelets: 250 10*3/uL (ref 150–400)
RBC: 3.46 MIL/uL — ABNORMAL LOW (ref 3.87–5.11)
RDW: 15.5 % (ref 11.5–15.5)
WBC: 8 10*3/uL (ref 4.0–10.5)

## 2013-12-19 MED ORDER — CARVEDILOL 25 MG PO TABS
25.0000 mg | ORAL_TABLET | Freq: Two times a day (BID) | ORAL | Status: DC
  Administered 2013-12-19 – 2013-12-21 (×4): 25 mg via ORAL
  Filled 2013-12-19 (×6): qty 1

## 2013-12-19 MED ORDER — OXYCODONE HCL 5 MG PO TABS
5.0000 mg | ORAL_TABLET | Freq: Once | ORAL | Status: AC
  Administered 2013-12-19: 5 mg via ORAL
  Filled 2013-12-19: qty 1

## 2013-12-19 MED ORDER — HALOPERIDOL LACTATE 5 MG/ML IJ SOLN: 2.0000 mg | Freq: Four times a day (QID) | INTRAMUSCULAR | Status: DC | PRN

## 2013-12-19 MED ORDER — CYCLOBENZAPRINE HCL 10 MG PO TABS
10.0000 mg | ORAL_TABLET | Freq: Once | ORAL | Status: AC
Start: 2013-12-19 — End: 2013-12-19
  Administered 2013-12-19: 10 mg via ORAL
  Filled 2013-12-19: qty 1

## 2013-12-19 MED ORDER — IPRATROPIUM-ALBUTEROL 0.5-2.5 (3) MG/3ML IN SOLN: 3.0000 mL | Freq: Four times a day (QID) | RESPIRATORY_TRACT | Status: DC | PRN

## 2013-12-19 MED ORDER — QUETIAPINE 12.5 MG HALF TABLET
12.5000 mg | ORAL_TABLET | Freq: Every evening | ORAL | Status: DC | PRN
  Filled 2013-12-19 (×3): qty 1

## 2013-12-19 MED ORDER — HYDROCODONE-ACETAMINOPHEN 10-325 MG PO TABS
1.0000 | ORAL_TABLET | Freq: Four times a day (QID) | ORAL | Status: DC | PRN
  Administered 2013-12-21 (×2): 2 via ORAL
  Filled 2013-12-19 (×3): qty 2

## 2013-12-19 NOTE — Plan of Care (Signed)
Problem: Phase I Progression Outcomes Goal: Pain controlled with appropriate interventions Outcome: Not Progressing Back pain unrelieved by tramadol.

## 2013-12-19 NOTE — Progress Notes (Signed)
Room air SAT 86%, placed on 2 l n/c

## 2013-12-19 NOTE — Plan of Care (Signed)
Problem: Phase I Progression Outcomes Goal: EF % per last Echo/documented,Core Reminder form on chart Outcome: Completed/Met Date Met:  12/19/13 EF=60-65% per 2D echo done 12/17/13

## 2013-12-19 NOTE — Progress Notes (Signed)
[facility] Progress Note [place] TEAM 1 - Stepdown/ICU TEAM   [name] [name] [id number] DOB: Aug 15, 1930 DOA: 12/16/2013 PCP: No PCP Per Patient  Brief narrative: [name] [name] is a 80 y.o. female with Past medical history of hypertension, glaucoma hypercholesterolemia, diabetes mellitus, chronic kidney disease, recent thoracic osteomyelitis who lives in SNF.  The patient presented with complaints of cough and shortness of breath that has been ongoing since last few days. She was recently admitted for thoracic osteomyelitis and was discharged on IV vancomycin and IV ceftriaxone. In the nursing home 3 days ago she was found to have worsening cough and shortness of breath with yellowish sputum production without any blood and progressively worsening generalized weakness. She was started on azithromycin as an added antibiotic to her regimen. Despite that her breathing was progressively worsening and today she was short of breath even at rest and therefore they brought her here as she was also found to be hypoxic and high 80s on room air. The daughter has noted that she has been wheezing more frequently.  She also had some chills.  That is a suspicion that she might have choked up on her meal by the nurse practitioner in SNF   Subjective: Was quite restless and confused last night. Her daughter had to spend the night. Now quite tired. No longer confused. No new complaints.   Assessment/Plan: Principal Problem:   Acute respiratory failure with hypoxia  - cont Vanc and Cefepime for HCAP- would continue Cefepime for 7 days and convert back to Rocephin - influenza negative however as symptoms very consistent with influenza along with a pneumonitis picture on CXR, will continue to treat with Tamiflu  -were treating with Lasix as CXR was consistent with pulmonary edema pattern however, ECHO does not reveal heart failure issues therefore will d/c Lasix - also to be noted, pt's weight had not  changed from prior discharge.   Active Problems:    CKD (chronic kidney disease) stage 4, GFR 15-29 ml/min - - improved back to baseline    Osteomyelitis of thoracic spine - cont Vancomycin- on Cefepime now instead of Rocephin    Edema - possibly due to venous stasis    Hypertension - BP elevated- Increase Coreg from 12.5 to 25 - change Nebs to PRN only- will add PRN labetalol  Acute delirium/ sundowning - add PRN Seroquel and Haldol for tonight   Code Status: Full code  Family Communication: none Disposition Plan: return to SNF on Monday  Consultants: none  Procedures: none  Antibiotics: Vancomycin  Cefepime- 1/28- stop on 1/3  DVT prophylaxis: Lovenox  Objective: Blood pressure 155/63, pulse 100, temperature 98 F (36.7 C), temperature source Oral, resp. rate 18, height 5\' 1"  (1.549 m), weight 104.2 kg (229 lb 11.5 oz), SpO2 100.00%.  Intake/Output Summary (Last 24 hours) at 12/19/13 1130 Last data filed at 12/19/13 0700  Gross per 24 hour  Intake    860 ml  Output   1025 ml  Net   -165 ml     Exam: General: AAO x 3 - No acute respiratory distress Lungs:  CTA b/l -- 95% on room air Cardiovascular: Regular rate and rhythm without murmur gallop or rub normal S1 and S2 Abdomen: Nontender, nondistended, soft, bowel sounds positive, no rebound, no ascites, no appreciable mass Extremities: No significant cyanosis, clubbing- no further pitting edema bilateral lower extremities  Data Reviewed: Basic Metabolic Panel:  Recent Labs Lab 12/16/13 1812 12/17/13 0128 12/18/13 0450 12/19/13 0450  NA 133* 130* 137 137  K 4.5 4.0 4.1 4.0  CL 95* 92* 98 98  CO2 21 25 26 24   GLUCOSE 154* 143* 129* 125*  BUN 39* 41* 44* 39*  CREATININE 1.69* 1.73* 1.68* 1.43*  CALCIUM 10.3 10.6* 10.2 10.5   Liver Function Tests:  Recent Labs Lab 12/17/13 0128  AST 43*  ALT 56*  ALKPHOS 102  BILITOT <0.2*  PROT 6.4  ALBUMIN 2.9*   No results found for this basename:  LIPASE, AMYLASE,  in the last 168 hours No results found for this basename: AMMONIA,  in the last 168 hours CBC:  Recent Labs Lab 12/16/13 2021 12/17/13 0128 12/18/13 0450 12/19/13 0450  WBC 8.2 8.6 6.8 8.0  NEUTROABS 5.8 6.2  --   --   HGB 9.8* 9.7* 8.9* 9.6*  HCT 30.2* 29.8* 26.8* 28.7*  MCV 83.0 83.9 83.2 82.9  PLT 199 193 218 250   Cardiac Enzymes:  Recent Labs Lab 12/16/13 1812  TROPONINI <0.30   BNP (last 3 results)  Recent Labs  11/20/13 0530 11/25/13 0535 12/16/13 1812  PROBNP 894.4* 1142.0* 11565.0*   CBG:  Recent Labs Lab 12/18/13 0827 12/18/13 1248 12/18/13 1739 12/18/13 2134 12/19/13 0819  GLUCAP 114* 151* 152* 142* 110*    Recent Results (from the past 240 hour(s))  URINE CULTURE     Status: None   Collection Time    12/16/13  5:47 PM      Result Value Range Status   Specimen Description URINE, CATHETERIZED   Final   Special Requests NONE   Final   Culture  Setup Time     Final   Value: 12/16/2013 19:12     Performed at [place]     Final   Value: NO GROWTH     Performed at [facility]   Culture     Final   Value: NO GROWTH     Performed at [facility]   Report Status 12/17/2013 FINAL   Final  CULTURE, BLOOD (ROUTINE X 2)     Status: None   Collection Time    12/16/13 11:14 PM      Result Value Range Status   Specimen Description BLOOD PICC LINE   Final   Special Requests BOTTLES DRAWN AEROBIC ONLY 10CC   Final   Culture  Setup Time     Final   Value: 12/17/2013 03:34     Performed at [facility]   Culture     Final   Value:        BLOOD CULTURE RECEIVED NO GROWTH TO DATE CULTURE WILL BE HELD FOR 5 DAYS BEFORE ISSUING A FINAL NEGATIVE REPORT     Performed at [facility]   Report Status PENDING   Incomplete  MRSA PCR SCREENING     Status: None   Collection Time    12/17/13 12:27 AM      Result Value Range Status   MRSA by PCR NEGATIVE  NEGATIVE Final   Comment:             The GeneXpert MRSA Assay (FDA     approved for NASAL specimens     only), is one component of a     comprehensive MRSA colonization     surveillance program. It is not     intended to diagnose MRSA     infection nor to guide or     monitor treatment  for     MRSA infections.     Studies:  Recent x-ray studies have been reviewed in detail by the Attending Physician  Scheduled Meds:  Scheduled Meds: . alteplase  2 mg Intracatheter Once  . carvedilol  12.5 mg Oral BID WC  . ceFEPime (MAXIPIME) IV  1 g Intravenous Q24H  . enoxaparin (LOVENOX) injection  30 mg Subcutaneous Q24H  . feeding supplement (ENSURE COMPLETE)  237 mL Oral BID BM  . guaiFENesin  600 mg Oral BID  . hydrALAZINE  50 mg Oral TID  . lidocaine  1 patch Transdermal Q24H  . multivitamin with minerals  1 tablet Oral Daily  . oseltamivir  30 mg Oral Daily  . simvastatin  20 mg Oral q1800  . vancomycin  1,500 mg Intravenous Q48H   Continuous Infusions:    Time spent on care of this patient:  25 min   Debbe Odea, MD  [facility] Office  [phone] Pager - Text Page per Shea Evans as per below:  On-Call/Text Page:      [email or web address]      password [email or web address]  If 7PM-7AM, please contact night-coverage [email or web address] Password [email or web address] 12/19/2013, 11:30 AM   LOS: 3 days

## 2013-12-19 NOTE — Progress Notes (Signed)
Patient can't get comfortable due to back pain.Per daughter,patient was on vicodin for pain when she was at the SNF.T. Rogue Bussing text paged.Awaiting call back. Keshaun Dubey Joselita,RN

## 2013-12-20 ENCOUNTER — Inpatient Hospital Stay (HOSPITAL_COMMUNITY): Payer: Medicare HMO

## 2013-12-20 LAB — GLUCOSE, CAPILLARY
GLUCOSE-CAPILLARY: 189 mg/dL — AB (ref 70–99)
GLUCOSE-CAPILLARY: 134 mg/dL — AB (ref 70–99)
GLUCOSE-CAPILLARY: 130 mg/dL — AB (ref 70–99)
Glucose-Capillary: 137 mg/dL — ABNORMAL HIGH (ref 70–99)

## 2013-12-20 NOTE — Progress Notes (Signed)
[facility] Progress Note [place] TEAM 1 - Stepdown/ICU TEAM   JEANNIA TATRO FIE:[id number] DOB: Nov 05, 1930 DOA: 12/16/2013 PCP: No PCP Per Patient  Brief narrative: CHE BELOW is a 80 y.o. female with Past medical history of hypertension, glaucoma hypercholesterolemia, diabetes mellitus, chronic kidney disease, recent thoracic osteomyelitis who lives in SNF.  The patient presented with complaints of cough and shortness of breath that has been ongoing since last few days. She was recently admitted for thoracic osteomyelitis and was discharged on IV vancomycin and IV ceftriaxone. In the nursing home 3 days ago she was found to have worsening cough and shortness of breath with yellowish sputum production without any blood and progressively worsening generalized weakness. She was started on azithromycin as an added antibiotic to her regimen. Despite that her breathing was progressively worsening and today she was short of breath even at rest and therefore they brought her here as she was also found to be hypoxic and high 80s on room air. The daughter has noted that she has been wheezing more frequently.  She also had some chills.  That is a suspicion that she might have choked up on her meal by the nurse practitioner in SNF   Subjective: She states she still did not sleep well last night- no other complaints.   Assessment/Plan: Principal Problem:   Acute respiratory failure with hypoxia  - cont Vanc and Cefepime for HCAP- would continue Cefepime for 7 days and convert back to Rocephin - influenza negative however as symptoms very consistent with influenza along with a pneumonitis picture on CXR, will continue to treat with Tamiflu  -were treating with Lasix as CXR was consistent with pulmonary edema pattern however, ECHO does not reveal heart failure issues therefore d/c'd Lasix - also to be noted, pt's weight had not changed from prior discharge.  - back on 2 L O2 yesterday afternoon  as she had a pulse ox in 80s yesterday afternoon per RN (I was not called) - repeat CXR today - lungs otherwise clear.   Active Problems:    CKD (chronic kidney disease) stage 4, GFR 15-29 ml/min - - improved back to baseline    Osteomyelitis of thoracic spine - cont Vancomycin- on Cefepime now instead of Rocephin    Edema - possibly due to venous stasis    Hypertension - BP elevated- Increase Coreg from 12.5 to 25 - change Nebs to PRN only- will add PRN labetalol  Acute delirium/ sundowning - add PRN Seroquel and Haldol for tonight   Code Status: Full code  Family Communication: none Disposition Plan: return to SNF on Monday  Consultants: none  Procedures: none  Antibiotics: Vancomycin  Cefepime- 1/28- stop on 2/3  DVT prophylaxis: Lovenox  Objective: Blood pressure 151/65, pulse 98, temperature 98.4 F (36.9 C), temperature source Oral, resp. rate 22, height 5\' 1"  (1.549 m), weight 105.9 kg (233 lb 7.5 oz), SpO2 97.00%.  Intake/Output Summary (Last 24 hours) at 12/20/13 1338 Last data filed at 12/19/13 1700  Gross per 24 hour  Intake    120 ml  Output      0 ml  Net    120 ml     Exam: General: AAO x 3 - No acute respiratory distress Lungs:  CTA b/l -- 93% on 3 L - was 95% on RA when check by myself on 1/1 Cardiovascular: Regular rate and rhythm without murmur gallop or rub normal S1 and S2 Abdomen: Nontender, nondistended, soft, bowel sounds positive, no  rebound, no ascites, no appreciable mass Extremities: No significant cyanosis, clubbing- no further pitting edema bilateral lower extremities  Data Reviewed: Basic Metabolic Panel:  Recent Labs Lab 12/16/13 1812 12/17/13 0128 12/18/13 0450 12/19/13 0450  NA 133* 130* 137 137  K 4.5 4.0 4.1 4.0  CL 95* 92* 98 98  CO2 21 25 26 24   GLUCOSE 154* 143* 129* 125*  BUN 39* 41* 44* 39*  CREATININE 1.69* 1.73* 1.68* 1.43*  CALCIUM 10.3 10.6* 10.2 10.5   Liver Function Tests:  Recent Labs Lab  12/17/13 0128  AST 43*  ALT 56*  ALKPHOS 102  BILITOT <0.2*  PROT 6.4  ALBUMIN 2.9*   No results found for this basename: LIPASE, AMYLASE,  in the last 168 hours No results found for this basename: AMMONIA,  in the last 168 hours CBC:  Recent Labs Lab 12/16/13 2021 12/17/13 0128 12/18/13 0450 12/19/13 0450  WBC 8.2 8.6 6.8 8.0  NEUTROABS 5.8 6.2  --   --   HGB 9.8* 9.7* 8.9* 9.6*  HCT 30.2* 29.8* 26.8* 28.7*  MCV 83.0 83.9 83.2 82.9  PLT 199 193 218 250   Cardiac Enzymes:  Recent Labs Lab 12/16/13 1812  TROPONINI <0.30   BNP (last 3 results)  Recent Labs  11/20/13 0530 11/25/13 0535 12/16/13 1812  PROBNP 894.4* 1142.0* 11565.0*   CBG:  Recent Labs Lab 12/19/13 1147 12/19/13 1629 12/19/13 2055 12/20/13 0733 12/20/13 1201  GLUCAP 134* 162* 194* 137* 189*    Recent Results (from the past 240 hour(s))  URINE CULTURE     Status: None   Collection Time    12/16/13  5:47 PM      Result Value Range Status   Specimen Description URINE, CATHETERIZED   Final   Special Requests NONE   Final   Culture  Setup Time     Final   Value: 12/16/2013 19:12     Performed at [facility]     Final   Value: NO GROWTH     Performed at [facility]   Culture     Final   Value: NO GROWTH     Performed at [facility]   Report Status 12/17/2013 FINAL   Final  CULTURE, BLOOD (ROUTINE X 2)     Status: None   Collection Time    12/16/13 11:14 PM      Result Value Range Status   Specimen Description BLOOD PICC LINE   Final   Special Requests BOTTLES DRAWN AEROBIC ONLY 10CC   Final   Culture  Setup Time     Final   Value: 12/17/2013 03:34     Performed at [facility]   Culture     Final   Value:        BLOOD CULTURE RECEIVED NO GROWTH TO DATE CULTURE WILL BE HELD FOR 5 DAYS BEFORE ISSUING A FINAL NEGATIVE REPORT     Performed at [facility]   Report Status PENDING   Incomplete  MRSA PCR SCREENING     Status:  None   Collection Time    12/17/13 12:27 AM      Result Value Range Status   MRSA by PCR NEGATIVE  NEGATIVE Final   Comment:            The GeneXpert MRSA Assay (FDA     approved for NASAL specimens     only), is one component of a  comprehensive MRSA colonization     surveillance program. It is not     intended to diagnose MRSA     infection nor to guide or     monitor treatment for     MRSA infections.     Studies:  Recent x-ray studies have been reviewed in detail by the Attending Physician  Scheduled Meds:  Scheduled Meds: . alteplase  2 mg Intracatheter Once  . carvedilol  25 mg Oral BID WC  . ceFEPime (MAXIPIME) IV  1 g Intravenous Q24H  . enoxaparin (LOVENOX) injection  30 mg Subcutaneous Q24H  . feeding supplement (ENSURE COMPLETE)  237 mL Oral BID BM  . guaiFENesin  600 mg Oral BID  . hydrALAZINE  50 mg Oral TID  . lidocaine  1 patch Transdermal Q24H  . multivitamin with minerals  1 tablet Oral Daily  . simvastatin  20 mg Oral q1800  . vancomycin  1,500 mg Intravenous Q48H   Continuous Infusions:    Time spent on care of this patient:  25 min   Debbe Odea, MD  [facility] Office  [phone] Pager - Text Page per Shea Evans as per below:  On-Call/Text Page:      [email or web address]      password [email or web address]  If 7PM-7AM, please contact night-coverage [email or web address] Password [email or web address] 12/20/2013, 1:38 PM   LOS: 4 days

## 2013-12-20 NOTE — Progress Notes (Signed)
ANTIBIOTIC CONSULT NOTE - FOLLOW UP  Pharmacy Consult for vancomycin Indication: pneumonia  Allergies  Allergen Reactions  . Motrin [Ibuprofen] Hives    Patient Measurements: Height: 5\' 1"  (154.9 cm) Weight: 233 lb 7.5 oz (105.9 kg) IBW/kg (Calculated) : 47.8  Vital Signs: Temp: 98.4 F (36.9 C) (02/01 0900) Temp src: Oral (02/01 0900) BP: 151/65 mmHg (02/01 0900) Pulse Rate: 98 (02/01 0900) Intake/Output from previous day: 01/31 0701 - 02/01 0700 In: 410 [P.O.:360; IV Piggyback:50] Out: -    Recent Labs  12/18/13 0450 12/19/13 0450  WBC 6.8 8.0  HGB 8.9* 9.6*  PLT 218 250  CREATININE 1.68* 1.43*   Estimated Creatinine Clearance: 34.6 ml/min (by C-G formula based on Cr of 1.43). No results found for this basename: VANCOTROUGH, VANCOPEAK, VANCORANDOM, GENTTROUGH, GENTPEAK, GENTRANDOM, TOBRATROUGH, TOBRAPEAK, TOBRARND, AMIKACINPEAK, AMIKACINTROU, AMIKACIN,  in the last 72 hours   Microbiology: Recent Results (from the past 720 hour(s))  AFB CULTURE WITH SMEAR     Status: None   Collection Time    11/22/13 11:18 AM      Result Value Range Status   Specimen Description ABSCESS BACK   Final   Special Requests T 10 T 11   Final   ACID FAST SMEAR     Final   Value: NO ACID FAST BACILLI SEEN     Performed at [facility]   Culture     Final   Value: CULTURE WILL BE EXAMINED FOR 6 WEEKS BEFORE ISSUING A FINAL REPORT     Performed at [facility]   Report Status PENDING   Incomplete  ANAEROBIC CULTURE     Status: None   Collection Time    11/22/13 11:18 AM      Result Value Range Status   Specimen Description ABSCESS BACK   Final   Special Requests T 10 T 11   Final   Gram Stain     Final   Value: NO WBC SEEN     NO SQUAMOUS EPITHELIAL CELLS SEEN     NO ORGANISMS SEEN     Performed at [facility]   Culture     Final   Value: NO ANAEROBES ISOLATED     Performed at [facility]   Report Status 11/27/2013 FINAL   Final  FUNGUS  CULTURE W SMEAR     Status: None   Collection Time    11/22/13 11:18 AM      Result Value Range Status   Specimen Description ABSCESS BACK   Final   Special Requests T 10 T 11   Final   Fungal Smear     Final   Value: NO YEAST OR FUNGAL ELEMENTS SEEN     Performed at [facility]   Culture     Final   Value: No Fungi Isolated in 4 Weeks     Performed at [facility]   Report Status 12/18/2013 FINAL   Final  CULTURE, ROUTINE-ABSCESS     Status: None   Collection Time    11/22/13 11:19 AM      Result Value Range Status   Specimen Description ABSCESS BACK   Final   Special Requests T 10 T 11   Final   Gram Stain     Final   Value: RARE WBC PRESENT,BOTH PMN AND MONONUCLEAR     NO SQUAMOUS EPITHELIAL CELLS SEEN     NO ORGANISMS SEEN     Performed at [facility]   Culture     Final   Value: NO GROWTH 3 DAYS     Performed at [facility]   Report Status 11/25/2013 FINAL   Final  CULTURE, BLOOD (ROUTINE X 2)     Status: None   Collection Time    11/23/13  2:55 PM      Result Value Range Status   Specimen Description BLOOD LEFT HAND   Final   Special Requests BOTTLES DRAWN AEROBIC ONLY 10CC   Final   Culture  Setup Time     Final   Value: 11/23/2013 20:12     Performed at [facility]   Culture     Final   Value: NO GROWTH 5 DAYS     Performed at [facility]   Report Status 11/29/2013 FINAL   Final  CULTURE, BLOOD (ROUTINE X 2)     Status: None   Collection Time    11/23/13  3:05 PM      Result Value Range Status   Specimen Description BLOOD LEFT HAND   Final   Special Requests BOTTLES DRAWN AEROBIC ONLY 5CC   Final   Culture  Setup Time     Final   Value: 11/23/2013 20:10     Performed at [facility]   Culture     Final   Value: NO GROWTH 5 DAYS     Performed at [facility]   Report Status 11/29/2013 FINAL   Final  URINE CULTURE     Status: None   Collection Time    12/16/13  5:47 PM      Result Value  Range Status   Specimen Description URINE, CATHETERIZED   Final   Special Requests NONE   Final   Culture  Setup Time     Final   Value: 12/16/2013 19:12     Performed at [facility]     Final   Value: NO GROWTH     Performed at [facility]   Culture     Final   Value: NO GROWTH     Performed at [facility]   Report Status 12/17/2013 FINAL   Final  CULTURE, BLOOD (ROUTINE X 2)     Status: None   Collection Time    12/16/13 11:14 PM      Result Value Range Status   Specimen Description BLOOD PICC LINE   Final   Special Requests BOTTLES DRAWN AEROBIC ONLY 10CC   Final   Culture  Setup Time     Final   Value: 12/17/2013 03:34     Performed at [facility]   Culture     Final   Value:        BLOOD CULTURE RECEIVED NO GROWTH TO DATE CULTURE WILL BE HELD FOR 5 DAYS BEFORE ISSUING A FINAL NEGATIVE REPORT     Performed at [facility]   Report Status PENDING   Incomplete  MRSA PCR SCREENING     Status: None   Collection Time    12/17/13 12:27 AM      Result Value Range Status   MRSA by PCR NEGATIVE  NEGATIVE Final   Comment:            The GeneXpert MRSA Assay (FDA     approved for NASAL specimens     only), is one component of a     comprehensive MRSA colonization  surveillance program. It is not     intended to diagnose MRSA     infection nor to guide or     monitor treatment for     MRSA infections.    Anti-infectives   Start     Dose/Rate Route Frequency Ordered Stop   12/19/13 1000  ceFEPIme (MAXIPIME) 1 g in dextrose 5 % 50 mL IVPB     1 g 100 mL/hr over 30 Minutes Intravenous Every 24 hours 12/18/13 1353     12/18/13 1000  oseltamivir (TAMIFLU) capsule 30 mg     30 mg Oral Daily 12/17/13 1636 12/20/13 0958   12/16/13 2300  vancomycin (VANCOCIN) 1,500 mg in sodium chloride 0.9 % 500 mL IVPB     1,500 mg 250 mL/hr over 120 Minutes Intravenous Every 48 hours 12/16/13 2239     12/16/13 2245  ceFEPIme  (MAXIPIME) 1 g in dextrose 5 % 50 mL IVPB  Status:  Discontinued     1 g 100 mL/hr over 30 Minutes Intravenous 3 times per day 12/16/13 2232 12/16/13 2233   12/16/13 2245  oseltamivir (TAMIFLU) capsule 75 mg  Status:  Discontinued     75 mg Oral 2 times daily 12/16/13 2233 12/16/13 2233   12/16/13 2245  ceFEPIme (MAXIPIME) 1 g in dextrose 5 % 50 mL IVPB  Status:  Discontinued     1 g 100 mL/hr over 30 Minutes Intravenous Every 12 hours 12/16/13 2233 12/18/13 1353   12/16/13 2245  oseltamivir (TAMIFLU) capsule 30 mg  Status:  Discontinued     30 mg Oral 2 times daily 12/16/13 2233 12/17/13 1636      Assessment: 80 yo F from SNF recently admitted for thoracic osteomyelitis and discharged on IV vancomycin and ceftriaxone. Patient was admitted with SOB and hypoxia; pharmacy was consulted to dose vancomycin for pneumonia.  Vancomycin 1/28 >> Cefepime  1/28 >> Tamiflu 1/28 >>  1/28 blood culture >> NGTD 1/28 urine culture >> negative  Her renal function has improved during this admission (1.43 on 1/31; CrCl ~35 ml/min). No new BMET ordered for today.   Goal of Therapy:  Vancomycin trough level 15-20 mcg/ml  Plan:  -Continue vancomycin 1500 mg IV q 48 hours -Continue cefepime 1 g IV q 24 hours -Consider vancomycin trough at steady state (4th dose due 2/4 at midnight) -Monitor renal function, cultures, clinical progression  [place] Stegemann C. Belle Charlie, PharmD Clinical Pharmacist-Resident Pager: [phone] Pharmacy: [phone] 12/20/2013 1:01 PM

## 2013-12-21 LAB — BASIC METABOLIC PANEL
BUN: 32 mg/dL — ABNORMAL HIGH (ref 6–23)
CALCIUM: 10.2 mg/dL (ref 8.4–10.5)
CHLORIDE: 100 meq/L (ref 96–112)
CO2: 25 meq/L (ref 19–32)
Creatinine, Ser: 1.32 mg/dL — ABNORMAL HIGH (ref 0.50–1.10)
GFR calc Af Amer: 43 mL/min — ABNORMAL LOW (ref >90)
GFR calc non Af Amer: 37 mL/min — ABNORMAL LOW (ref >90)
Glucose, Bld: 145 mg/dL — ABNORMAL HIGH (ref 70–99)
Potassium: 4.1 mEq/L (ref 3.7–5.3)
SODIUM: 136 meq/L — AB (ref 137–147)

## 2013-12-21 LAB — GLUCOSE, CAPILLARY
Glucose-Capillary: 180 mg/dL — ABNORMAL HIGH (ref 70–99)
Glucose-Capillary: 164 mg/dL — ABNORMAL HIGH (ref 70–99)
Glucose-Capillary: 180 mg/dL — ABNORMAL HIGH (ref 70–99)

## 2013-12-21 MED ORDER — IPRATROPIUM-ALBUTEROL 0.5-2.5 (3) MG/3ML IN SOLN: 3.0000 mL | RESPIRATORY_TRACT | Status: DC | PRN

## 2013-12-21 MED ORDER — HYDROCODONE-ACETAMINOPHEN 10-325 MG PO TABS: 1.0000 | ORAL_TABLET | Freq: Four times a day (QID) | ORAL | Status: DC | PRN

## 2013-12-21 MED ORDER — DEXTROSE 5 % IV SOLN: 2.0000 g | INTRAVENOUS | Status: DC

## 2013-12-21 MED ORDER — ENOXAPARIN SODIUM 40 MG/0.4ML ~~LOC~~ SOLN
40.0000 mg | SUBCUTANEOUS | Status: DC
  Filled 2013-12-21: qty 0.4

## 2013-12-21 MED ORDER — CARVEDILOL 25 MG PO TABS: 25.0000 mg | ORAL_TABLET | Freq: Two times a day (BID) | ORAL | Status: AC

## 2013-12-21 MED ORDER — TRAMADOL HCL 50 MG PO TABS: 50.0000 mg | ORAL_TABLET | ORAL | Status: DC | PRN

## 2013-12-21 MED ORDER — DEXTROSE 5 % IV SOLN: 1.0000 g | INTRAVENOUS | Status: AC

## 2013-12-21 NOTE — Clinical Social Work Psychosocial (Signed)
Clinical Social Work Department BRIEF PSYCHOSOCIAL ASSESSMENT AND DISCHARGE NOTE 12/21/2013  Patient:  [name] [name], [name] [name]     Account Number:  [id number]     Admit date:  12/16/2013  Clinical Social Worker:  Frederico Hamman  Date/Time:  12/21/2013 05:11 AM  Referred by:  Physician  Date Referred:  12/21/2013 Referred for  SNF Placement   Other Referral:   Interview type:  Other - See comment Other interview type:   Carla and Crystal  in admissions at [place] Status:  [place] Admitted from facility:  [facility] Level of care:  [place] Primary support name:  Alleen Borne Primary support relationship to patient:  CHILD, ADULT Degree of support available:   Level of support unknown    CURRENT CONCERNS Current Concerns  Post-Acute Placement   Other Concerns:    SOCIAL WORK ASSESSMENT / PLAN Contact made with Isaias Cowman on 1/30 regarding patient's readiness to return to SNF and need for [facility] authorization. Clinicals transmitted to facility same date.    CSW rec'd call from [facility] in admission with Isaias Cowman on 2/2 advising CSW that authorization rec'd.   Assessment/plan status:  No Further Intervention Required Other assessment/ plan:   Information/referral to community resources:   None needed at time.    PATIENT'S/FAMILY'S RESPONSE TO PLAN OF CARE: Patient aware of discharge back to SNF today. Patient discharged back to [facility] today. Discharge paperwork transmitted to facility and discharge packet accompanied patient. CSW facilitated transport via ambulance.

## 2013-12-21 NOTE — Discharge Summary (Signed)
DISCHARGE SUMMARY  [name] [name]  MR#: [id number]  DOB:Feb 13, 1930  Date of Admission: 12/16/2013 Date of Discharge: 12/21/2013  Attending 30 T  Patient's PCP: Attending MD of record at SNF of choice   Disposition: D/C to SNF   Follow-up Appts:     Follow-up Information   Follow up with MD of record at your SNF .     Tests Needing Follow-up: -) repeat CXR is suggested in 7-10 days to assure her B infiltrates are clearing -) intermittent monitoring of her Oxygen saturations is suggested to determine when she can be weaned from her nasal cannula O2 support  Discharge Diagnoses: Acute respiratory failure with hypoxia - Multilobar B HCAP v/s viral pneumonitis  CKD (chronic kidney disease) stage 4, GFR 15-29 ml/min  Osteomyelitis of thoracic spine  LE Edema  Hypertension  Acute delirium/ sundowning   Initial presentation: 80 y.o. female with history of hypertension, glaucoma, hypercholesterolemia, diabetes mellitus, chronic kidney disease, and recent thoracic osteomyelitis who lives in a SNF.    The patient presented with complaints of cough and shortness of breath for a few days. She was  on IV vancomycin and IV ceftriaxone for thoracic osteomyelitis. She was found to have worsening cough and shortness of breath with yellowish sputum production with progressively worsening generalized weakness. She was started on azithromycin as an added antibiotic to her regimen. Despite that her breathing progressively worsened and therefore she was brought to [facility] where she was found to be hypoxic.   Hospital Course:  Acute respiratory failure with hypoxia - Multilobar B HCAP v/s viral pneumonitis  - Vanc and Cefepime utilized for possible HCAP - continue Cefepime for 7 days (through 12/22/2013) total then convert back to Rocephin for osteomyelitis of thoracic spine  - influenza negative however as symptoms were consistent with influenza along with a pneumonitis picture on  CXR, completed Tamiflu 5 day course -were treating with Lasix due to concern for pulmon edema - ECHO did not reveal heart failure therefore d/c'd Lasix - also to be noted, pt's weight had not changed from prior discharge.  - remaining stable on 2 L O2 - repeat CXR 2/1 w/o significant change in diffuse R lung infiltrate and L basilar infiltrate - cont O2 support until infiltrates clear (could be 1-2 weeks or more)  CKD (chronic kidney disease) stage 4, GFR 15-29 ml/min  - improved back to baseline at time of d/c   Osteomyelitis of thoracic spine  - cont Vancomycin - on Cefepime now instead of Rocephin - to resume Rocphin as per prior Rx after Cefepime dosing completed (through 12/22/2013)  LE Edema  - mild - likely due to venous stasis   Hypertension  - well controlled at time of d/c    Acute delirium/ sundowning  - resolved w/ PRN Seroquel and Haldol     Medication List    STOP taking these medications       azithromycin 500 MG tablet  Commonly known as:  ZITHROMAX     furosemide 40 MG tablet  Commonly known as:  LASIX      TAKE these medications       acetaminophen 325 MG tablet  Commonly known as:  TYLENOL  Take 650 mg by mouth every 4 (four) hours as needed for fever. *takes if temperature over 99.5*     carvedilol 25 MG tablet  Commonly known as:  COREG  Take 1 tablet (25 mg total) by mouth 2 (two) times daily with a meal.  dextrose 5 % SOLN 50 mL with ceFEPIme 1 G SOLR 1 g  Inject 1 g into the vein daily.     dextrose 5 % SOLN 50 mL with cefTRIAXone 2 G SOLR 2 g  Inject 2 g into the vein daily. X 6 WEEKS, till 01/03/14  Start taking on:  12/23/2013     feeding supplement (GLUCERNA SHAKE) Liqd  Take 237 mLs by mouth 2 (two) times daily between meals.     hydrALAZINE 50 MG tablet  Commonly known as:  APRESOLINE  Take 50 mg by mouth 4 (four) times daily.     HYDROcodone-acetaminophen 10-325 MG per tablet  Commonly known as:  NORCO  Take 1-2 tablets by mouth  every 6 (six) hours as needed for moderate pain.     ipratropium-albuterol 0.5-2.5 (3) MG/3ML Soln  Commonly known as:  DUONEB  Take 3 mLs by nebulization every 2 (two) hours as needed.     lidocaine 5 %  Commonly known as:  LIDODERM  Place 1 patch onto the skin daily. Remove & Discard patch within 12 hours or as directed by MD     multivitamin with minerals tablet  Take 1 tablet by mouth daily.     polyethylene glycol packet  Commonly known as:  MIRALAX / GLYCOLAX  Take 17 g by mouth daily.     senna 8.6 MG Tabs tablet  Commonly known as:  SENOKOT  Take 2 tablets by mouth daily.     simvastatin 20 MG tablet  Commonly known as:  ZOCOR  Take 20 mg by mouth daily at 6 PM.     sodium chloride 0.9 % injection  10-40 mLs by Intracatheter route as needed (flush).     sodium chloride 0.9 % SOLN 250 mL with vancomycin 1000 MG SOLR 1,000 mg  Inject 1,000 mg into the vein daily. *at 2100*     traMADol 50 MG tablet  Commonly known as:  ULTRAM  Take 50 mg by mouth every 4 (four) hours as needed (for breakthrough pain).     vitamin C 500 MG tablet  Commonly known as:  ASCORBIC ACID  Take 500 mg by mouth daily.          Oxygen [facility] [place] at 2LPM at all times   Day of Discharge BP 119/70  Pulse 78  Temp(Src) 98.4 F (36.9 C) (Oral)  Resp 18  Ht 5\' 1"  (1.549 m)  Wt 105.9 kg (233 lb 7.5 oz)  BMI 44.14 kg/m2  SpO2 96%  Physical Exam: General: No acute respiratory distress Lungs: fine crackles th/o R fields and in L base - no wheeze  Cardiovascular: Regular rate and rhythm without murmur gallop or rub normal S1 and S2 Abdomen: Nontender, nondistended, soft, bowel sounds positive, no rebound, no ascites, no appreciable mass Extremities: No significant cyanosis, clubbing;  Trace edema bilateral lower extremities  BASIC METABOLIC PANEL     Status: Abnormal   Collection Time    12/21/13  3:32 AM      Result Value Range   Sodium 136 (*) 137 - 147 mEq/L   Potassium 4.1  3.7 - 5.3  mEq/L   Chloride 100  96 - 112 mEq/L   CO2 25  19 - 32 mEq/L   Glucose, Bld 145 (*) 70 - 99 mg/dL   BUN 32 (*) 6 - 23 mg/dL   Creatinine, Ser 1.32 (*) 0.50 - 1.10 mg/dL   Calcium 10.2  8.4 - 10.5 mg/dL   GFR  calc non Af Amer 37 (*) >90 mL/min   GFR calc Af Amer 43 (*) >90 mL/min  GLUCOSE, CAPILLARY     Status: Abnormal   Collection Time    12/21/13  7:49 AM   Glucose-Capillary 180 (*) 70 - 99 mg/dL    Time spent in discharge (includes decision making & examination of pt): >35 minutes  12/21/2013, 11:55 AM   Cherene Altes, MD [facility] Office  [phone] Pager [phone]  On-Call/Text Page:      [email or web address]      password [facility]

## 2013-12-22 ENCOUNTER — Non-Acute Institutional Stay (SKILLED_NURSING_FACILITY): Payer: Medicare HMO | Admitting: Adult Health

## 2013-12-22 ENCOUNTER — Encounter: Payer: Self-pay | Admitting: Adult Health

## 2013-12-22 DIAGNOSIS — M549 Dorsalgia, unspecified: Secondary | ICD-10-CM

## 2013-12-22 DIAGNOSIS — K5900 Constipation, unspecified: Secondary | ICD-10-CM

## 2013-12-22 DIAGNOSIS — J189 Pneumonia, unspecified organism: Secondary | ICD-10-CM

## 2013-12-22 DIAGNOSIS — R531 Weakness: Secondary | ICD-10-CM

## 2013-12-22 DIAGNOSIS — R5381 Other malaise: Secondary | ICD-10-CM

## 2013-12-22 DIAGNOSIS — N184 Chronic kidney disease, stage 4 (severe): Secondary | ICD-10-CM

## 2013-12-22 DIAGNOSIS — E785 Hyperlipidemia, unspecified: Secondary | ICD-10-CM

## 2013-12-22 DIAGNOSIS — M869 Osteomyelitis, unspecified: Secondary | ICD-10-CM

## 2013-12-22 DIAGNOSIS — R5383 Other fatigue: Secondary | ICD-10-CM

## 2013-12-22 DIAGNOSIS — M4624 Osteomyelitis of vertebra, thoracic region: Secondary | ICD-10-CM

## 2013-12-22 DIAGNOSIS — M519 Unspecified thoracic, thoracolumbar and lumbosacral intervertebral disc disorder: Secondary | ICD-10-CM

## 2013-12-22 DIAGNOSIS — M4644 Discitis, unspecified, thoracic region: Secondary | ICD-10-CM

## 2013-12-22 HISTORY — DX: Pneumonia, unspecified organism: J18.9

## 2013-12-22 NOTE — Progress Notes (Signed)
Patient ID: [name] [name], female   DOB: [birth year], 80 y.o.   MRN: [id number]     [facility]  Allergies  Allergen Reactions  . Motrin [Ibuprofen] Hives     Chief Complaint  Patient presents with  . Hospitalization Follow-up    HPI:  She has been hospitalized for viral pneumonitis; hypoxia; respiratory failure. She is here for short term rehab for her osteomyelitis of her spine. She remains on IV abt for this. There are no concerns being voiced by the nursing staff at this time. Her follow up chest x-ray has been setup. She is very weak today; has very poor tolerance to activity. She is unable to fully participate in the hpi or ros.   Past Medical History  Diagnosis Date  . Hypertension   . Gout   . Hypercholesteremia   . Diabetes mellitus     Past Surgical History  Procedure Laterality Date  . Cesarean section    . Rotator cuff repair    . Tonsillectomy    . Tubal ligation      VITAL SIGNS BP 154/80  Pulse 100  Ht 5' (1.524 m)  Wt 232 lb 11.2 oz (105.552 kg)  BMI 45.45 kg/m2   Patient's Medications  New Prescriptions   No medications on file  Previous Medications   ACETAMINOPHEN (TYLENOL) 325 MG TABLET    Take 650 mg by mouth every 4 (four) hours as needed for fever. *takes if temperature over 99.5*   CARVEDILOL (COREG) 25 MG TABLET    Take 1 tablet (25 mg total) by mouth 2 (two) times daily with a meal.   DEXTROSE 5 % SOLN 50 ML WITH CEFEPIME 1 G SOLR 1 G    Inject 1 g into the vein daily.   DEXTROSE 5 % SOLN 50 ML WITH CEFTRIAXONE 2 G SOLR 2 G    Inject 2 g into the vein daily. X 6 WEEKS, till 01/03/14   FEEDING SUPPLEMENT, GLUCERNA SHAKE, (GLUCERNA SHAKE) LIQD    Take 237 mLs by mouth 2 (two) times daily between meals.   HYDRALAZINE (APRESOLINE) 50 MG TABLET    Take 50 mg by mouth 4 (four) times daily.   HYDROCODONE-ACETAMINOPHEN (NORCO) 10-325 MG PER TABLET    Take 1-2 tablets by mouth every 6 (six) hours as needed for moderate pain.   IPRATROPIUM-ALBUTEROL (DUONEB) 0.5-2.5 (3) MG/3ML SOLN    Take 3 mLs by nebulization every 2 (two) hours as needed.   LIDOCAINE (LIDODERM) 5 %    Place 1 patch onto the skin daily. Remove & Discard patch within 12 hours or as directed by MD   MULTIPLE VITAMINS-MINERALS (MULTIVITAMIN WITH MINERALS) TABLET    Take 1 tablet by mouth daily.   POLYETHYLENE GLYCOL (MIRALAX / GLYCOLAX) PACKET    Take 17 g by mouth daily.   SENNA (SENOKOT) 8.6 MG TABS TABLET    Take 2 tablets by mouth daily.   SIMVASTATIN (ZOCOR) 20 MG TABLET    Take 20 mg by mouth daily at 6 PM.   SODIUM CHLORIDE 0.9 % INJECTION    10-40 mLs by Intracatheter route as needed (flush).   SODIUM CHLORIDE 0.9 % SOLN 250 ML WITH VANCOMYCIN 1000 MG SOLR 1,000 MG    Inject 1,000 mg into the vein daily. *at 2100*   TRAMADOL (ULTRAM) 50 MG TABLET    Take 1 tablet (50 mg total) by mouth every 4 (four) hours as needed (for breakthrough pain).   VITAMIN C (ASCORBIC ACID)  500 MG TABLET    Take 500 mg by mouth daily.  Modified Medications   No medications on file  Discontinued Medications   No medications on file    SIGNIFICANT DIAGNOSTIC EXAMS   11-20-13: thoracic spine x-ray: 1. Highly unusual appearance of T10 and T11, as above, which is concern for potential discitis/osteomyelitis. Given the patient's history of recent fall, these findings could simply be related to acute trauma, however, the marked irregularity of the endplates at this level raises concern for infection. Correlation with MRI of the thoracic spine with and without IV gadolinium is recommended. 2. The appearance of the lung parenchyma suggests an underlying interstitial lung disease. This could be better evaluated with followup nonemergent high-resolution chest CT if clinicallyappropriate.  11-20-13: lumbar spine x-ray: 1. The appearance of T10 and T11 remains concerning for potential discitis/osteomyelitis, and correlation with MRI of the thoracic spine with and without IV  gadolinium is strongly recommended.2. No acute radiographic abnormality of the lumbar spine itself.  11-20-13: mri lumbar spine: Abnormal destructive changes of the T10-11 disc,  No MR findings of discitis osteomyelitis in the lumbar spine. Degenerative change of the lumbar spine: Moderate canal stenosis at L3-4, mild at L4-5. Neural foraminal narrowing L2-3 to L5-S1: Severe on the right at L5-S1. Mildly widened facets at L3-4, which can be associated with dynamic instability and could be further characterize with flexion and extension radiographs if clinically indicated.  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 Moderate canal stenosis at T10-11 with severe neural foraminal narrowing. Abnormal signal within T11 disc concerning for early discitis without osteomyelitis. Degenerative change at this level resulting in mild canal stenosis and moderate to severe right, moderate left neural foraminal narrowing. Grade 1 paraspinal muscle strain T10-11.    11-25-13: chest x-ray: 1. Right upper extremity PICC with the tip in the right atrium. This could be retracted 2 cm for positioning at the junction of the superior vena cava and right atrium. 2. Diffuse reticular pattern in the lungs suggesting chronic interstitial lung disease. Other differential considerations discussed above. 3. Borderline cardiomegaly.  12-10-13: chest x-ray: 1. Poor inspiration due to patient habitus. 2. Minimal cardiomegaly 3. Moderate to severe pulmonary vascular congestion 4. Small right pulmonary effusion. 5. Patchy pneumonitis or edema at mid to lower bilateral lungs.   12-10-13: kub: mild ileus no obstruction. Moderate diffuse retained colorectal stool no abnormal calcifications seen   12-16-13: chest x-ray: Progressive accentuation of interstitial markings bilaterally which may  represent pulmonary edema or pneumonitis.  12-17-13: 2-d echo: Left ventricle: The cavity size was normal. Wall thickness was increased in a pattern of moderate LVH. Systolic function was normal. The estimated ejection fraction was in the range of 60% to 65%. Wall motion was normal; there were no regional wall motion abnormalities. - Left atrium: The atrium was mildly dilated.- Pulmonary arteries: Systolic pressure was mildly increased.  12-18-13: chest x-ray: 1. The lungs are better inflated today. There remain bilateral interstitial and alveolar infiltrates. This coupled with the prominence of the cardiac silhouette and pulmonary vascularity suggests congestive heart failure. Certainly pneumonia cannot be excluded in the appropriate clinical setting. 2. A followup PA and lateral chest x-ray would be useful when the patient can tolerate the procedure.    12-18-13: bilateral lower extremity doppler: - No evidence of deep vein thrombosis involving the right   lower extremity.- No evidence  of deep vein thrombosis involving the left lower extremity.  12-20-13: chest x-ray: 1. Right greater than left patchy alveolar interstitial densities, unchanged from the prior study. 2. New right PICC, well positioned.  No other change.     LABS REVIEWED:   11-20-13: wbc 8.7; hgb 12.1; hct 37.3; mcv 86.1plt 202; glucose 125; bun 32; creat 1.85; k+3.9; na++144; ca++ 10.8; sed rate 27; BNP 894.4; hgb a1c 6.8; tsh 1.225 11-25-13: wbc 7.6; hgb 10.8; hct 34.1; mcv 86.3;plt 178; glucose 125; bun 20; creat 1.48; k+4.1; na++140; ca++ 10.2 BNP 1142  12-10-13: bun 15; creat 1.2 12-16-13: wbc 8.2; hgb 9.8 hct 30.2; mcv 83.0; plt 199; glucose 154; bun 39; creat 1.69 ;k+4.5;na++133 BNP: 11565.0; D-Dimer: 1.61; urine culture: no growth; influenza: negative.       Review of Systems  Unable to perform ROS   Physical Exam  Constitutional: No distress.  obese  Neck: Neck supple. No JVD present.  Cardiovascular: Normal rate,  regular rhythm and intact distal pulses.   Respiratory: No respiratory distress.  Has increased effort present with diminished breath sound bilaterally; has fine rales throughout   GI: Soft. Bowel sounds are normal. She exhibits no distension. There is no tenderness.  Musculoskeletal: She exhibits no edema.  Is able to move all extremities; is very weak with poor trunk control   Neurological: She is alert.  Skin: Skin is warm and dry. She is not diaphoretic.  Right arm picc line      ASSESSMENT/ PLAN:  1. Osteomyelitis: will continue vancomycin through 01-03-14. Will continue ceftriaxone 2 gm daily through 01-03-14. Will continue therapy as directed; will continue vicodin 10/325 mg 1-2 tabs every 6 hours as needed for pain and will continue lidoderm patch to lower back daily and will monitor her status.   2. Pneumonia; she is to complete her cefepime 1 gm daily today; will continue her 02 2liters; will continue duoneb every 2 hours as needed will continue to monitor her status   3.  Back pain: she is presently being managed on vicodin 10/325 mg 1-2 tabs every 6 hours as needed and lidoderm patch to her back daily; ultram 50 mg every 4 hours as needed and  will continue to monitor her status   4. Hypertension: she is stable at the present time; will continue coreg 25 mg twice daily apresoline 50 mg four times daily;   5. Dyslipidemia; will continue zocor 20 mg daily   6. Constipation; will continue senna 2 tabs daily; miralax daily   7. CKD; stage IV: her renal function is without change; is on vancomycin for her osteomyelitis which continues to be dosed by pharmacy. Will monitor her status.   Time spent with patient 50 minutes.

## 2013-12-23 LAB — CULTURE, BLOOD (ROUTINE X 2): Culture: NO GROWTH

## 2013-12-25 ENCOUNTER — Other Ambulatory Visit: Payer: Self-pay

## 2013-12-25 LAB — VANCOMYCIN, TROUGH: Vancomycin, Trough: 23 ug/mL (ref 10–20)

## 2013-12-26 ENCOUNTER — Other Ambulatory Visit: Payer: Self-pay

## 2013-12-26 LAB — BUN: BUN: 30 mg/dL — ABNORMAL HIGH (ref 7–18)

## 2013-12-26 LAB — CREATININE, SERUM
CREATININE: 1.63 mg/dL — AB (ref 0.60–1.30)
EGFR (African American): 34 — ABNORMAL LOW
GFR CALC NON AF AMER: 29 — AB

## 2013-12-27 ENCOUNTER — Emergency Department (HOSPITAL_COMMUNITY): Payer: Medicare HMO

## 2013-12-27 ENCOUNTER — Encounter (HOSPITAL_COMMUNITY): Payer: Self-pay | Admitting: Emergency Medicine

## 2013-12-27 ENCOUNTER — Inpatient Hospital Stay (HOSPITAL_COMMUNITY)
Admission: EM | Admit: 2013-12-27 | Discharge: 2014-01-01 | DRG: 291 | Disposition: A | Discharge status: Nursing Home (Includes ICF) | Payer: Medicare HMO | Attending: Internal Medicine | Admitting: Internal Medicine

## 2013-12-27 DIAGNOSIS — D649 Anemia, unspecified: Secondary | ICD-10-CM | POA: Diagnosis present

## 2013-12-27 DIAGNOSIS — N179 Acute kidney failure, unspecified: Secondary | ICD-10-CM

## 2013-12-27 DIAGNOSIS — E1169 Type 2 diabetes mellitus with other specified complication: Secondary | ICD-10-CM | POA: Diagnosis present

## 2013-12-27 DIAGNOSIS — I119 Hypertensive heart disease without heart failure: Secondary | ICD-10-CM

## 2013-12-27 DIAGNOSIS — E785 Hyperlipidemia, unspecified: Secondary | ICD-10-CM | POA: Diagnosis present

## 2013-12-27 DIAGNOSIS — Z9989 Dependence on other enabling machines and devices: Secondary | ICD-10-CM

## 2013-12-27 DIAGNOSIS — M4624 Osteomyelitis of vertebra, thoracic region: Secondary | ICD-10-CM

## 2013-12-27 DIAGNOSIS — Z87891 Personal history of nicotine dependence: Secondary | ICD-10-CM

## 2013-12-27 DIAGNOSIS — D638 Anemia in other chronic diseases classified elsewhere: Secondary | ICD-10-CM | POA: Diagnosis present

## 2013-12-27 DIAGNOSIS — I509 Heart failure, unspecified: Secondary | ICD-10-CM

## 2013-12-27 DIAGNOSIS — E7800 Pure hypercholesterolemia, unspecified: Secondary | ICD-10-CM | POA: Diagnosis present

## 2013-12-27 DIAGNOSIS — G9340 Encephalopathy, unspecified: Secondary | ICD-10-CM | POA: Diagnosis present

## 2013-12-27 DIAGNOSIS — N189 Chronic kidney disease, unspecified: Secondary | ICD-10-CM

## 2013-12-27 DIAGNOSIS — I5032 Chronic diastolic (congestive) heart failure: Secondary | ICD-10-CM | POA: Insufficient documentation

## 2013-12-27 DIAGNOSIS — I5033 Acute on chronic diastolic (congestive) heart failure: Principal | ICD-10-CM | POA: Diagnosis present

## 2013-12-27 DIAGNOSIS — K5900 Constipation, unspecified: Secondary | ICD-10-CM

## 2013-12-27 DIAGNOSIS — I10 Essential (primary) hypertension: Secondary | ICD-10-CM

## 2013-12-27 DIAGNOSIS — M1990 Unspecified osteoarthritis, unspecified site: Secondary | ICD-10-CM | POA: Diagnosis present

## 2013-12-27 DIAGNOSIS — Z9981 Dependence on supplemental oxygen: Secondary | ICD-10-CM

## 2013-12-27 DIAGNOSIS — I5031 Acute diastolic (congestive) heart failure: Secondary | ICD-10-CM | POA: Diagnosis present

## 2013-12-27 DIAGNOSIS — N184 Chronic kidney disease, stage 4 (severe): Secondary | ICD-10-CM

## 2013-12-27 DIAGNOSIS — R0602 Shortness of breath: Secondary | ICD-10-CM

## 2013-12-27 DIAGNOSIS — M4644 Discitis, unspecified, thoracic region: Secondary | ICD-10-CM

## 2013-12-27 DIAGNOSIS — N183 Chronic kidney disease, stage 3 unspecified: Secondary | ICD-10-CM | POA: Diagnosis present

## 2013-12-27 DIAGNOSIS — J8410 Pulmonary fibrosis, unspecified: Secondary | ICD-10-CM | POA: Diagnosis present

## 2013-12-27 DIAGNOSIS — J189 Pneumonia, unspecified organism: Secondary | ICD-10-CM

## 2013-12-27 DIAGNOSIS — M109 Gout, unspecified: Secondary | ICD-10-CM | POA: Diagnosis present

## 2013-12-27 DIAGNOSIS — M9080 Osteopathy in diseases classified elsewhere, unspecified site: Secondary | ICD-10-CM | POA: Diagnosis present

## 2013-12-27 DIAGNOSIS — E119 Type 2 diabetes mellitus without complications: Secondary | ICD-10-CM

## 2013-12-27 DIAGNOSIS — I130 Hypertensive heart and chronic kidney disease with heart failure and stage 1 through stage 4 chronic kidney disease, or unspecified chronic kidney disease: Secondary | ICD-10-CM | POA: Diagnosis present

## 2013-12-27 DIAGNOSIS — G4733 Obstructive sleep apnea (adult) (pediatric): Secondary | ICD-10-CM | POA: Diagnosis present

## 2013-12-27 DIAGNOSIS — E875 Hyperkalemia: Secondary | ICD-10-CM

## 2013-12-27 DIAGNOSIS — Z6841 Body Mass Index (BMI) 40.0 and over, adult: Secondary | ICD-10-CM

## 2013-12-27 HISTORY — DX: Edema, unspecified: R60.9

## 2013-12-27 HISTORY — DX: Malignant (primary) neoplasm, unspecified: C80.1

## 2013-12-27 HISTORY — DX: Heart failure, unspecified: I50.9

## 2013-12-27 HISTORY — DX: Disorder of kidney and ureter, unspecified: N28.9

## 2013-12-27 HISTORY — DX: Chronic diastolic (congestive) heart failure: I50.32

## 2013-12-27 LAB — TROPONIN I: Troponin I: <0.3 ng/mL (ref <0.30)

## 2013-12-27 LAB — CBC WITH DIFFERENTIAL/PLATELET
BASOS ABS: 0.1 10*3/uL (ref 0.0–0.1)
BASOS PCT: 1 % (ref 0–1)
EOS ABS: 0.3 10*3/uL (ref 0.0–0.7)
EOS PCT: 3 % (ref 0–5)
HEMATOCRIT: 30.1 % — AB (ref 36.0–46.0)
HEMOGLOBIN: 9.4 g/dL — AB (ref 12.0–15.0)
Lymphocytes Relative: 14 % (ref 12–46)
Lymphs Abs: 1.1 10*3/uL (ref 0.7–4.0)
MCH: 26.4 pg (ref 26.0–34.0)
MCHC: 31.2 g/dL (ref 30.0–36.0)
MCV: 84.6 fL (ref 78.0–100.0)
MONO ABS: 0.7 10*3/uL (ref 0.1–1.0)
MONOS PCT: 9 % (ref 3–12)
Neutro Abs: 5.8 10*3/uL (ref 1.7–7.7)
Neutrophils Relative %: 73 % (ref 43–77)
Platelets: 231 10*3/uL (ref 150–400)
RBC: 3.56 MIL/uL — ABNORMAL LOW (ref 3.87–5.11)
RDW: 15.1 % (ref 11.5–15.5)
WBC: 8 10*3/uL (ref 4.0–10.5)

## 2013-12-27 LAB — BASIC METABOLIC PANEL
BUN: 27 mg/dL — ABNORMAL HIGH (ref 6–23)
CALCIUM: 10.9 mg/dL — AB (ref 8.4–10.5)
CO2: 24 mEq/L (ref 19–32)
Chloride: 101 mEq/L (ref 96–112)
Creatinine, Ser: 1.47 mg/dL — ABNORMAL HIGH (ref 0.50–1.10)
GFR, EST AFRICAN AMERICAN: 37 mL/min — AB (ref >90)
GFR, EST NON AFRICAN AMERICAN: 32 mL/min — AB (ref >90)
Glucose, Bld: 182 mg/dL — ABNORMAL HIGH (ref 70–99)
Potassium: 5.5 mEq/L — ABNORMAL HIGH (ref 3.7–5.3)
Sodium: 137 mEq/L (ref 137–147)

## 2013-12-27 LAB — PRO B NATRIURETIC PEPTIDE: PRO B NATRI PEPTIDE: 7200 pg/mL — AB (ref 0–450)

## 2013-12-27 LAB — VANCOMYCIN, TROUGH: Vancomycin Tr: 26.3 ug/mL (ref 10.0–20.0)

## 2013-12-27 LAB — GLUCOSE, CAPILLARY
GLUCOSE-CAPILLARY: 148 mg/dL — AB (ref 70–99)
GLUCOSE-CAPILLARY: 166 mg/dL — AB (ref 70–99)
GLUCOSE-CAPILLARY: 143 mg/dL — AB (ref 70–99)

## 2013-12-27 MED ORDER — MULTI-VITAMIN/MINERALS PO TABS: 1.0000 | ORAL_TABLET | Freq: Every day | ORAL | Status: DC

## 2013-12-27 MED ORDER — ALBUTEROL SULFATE (2.5 MG/3ML) 0.083% IN NEBU
5.0000 mg | INHALATION_SOLUTION | Freq: Once | RESPIRATORY_TRACT | Status: AC
  Administered 2013-12-27: 5 mg via RESPIRATORY_TRACT
  Filled 2013-12-27: qty 6

## 2013-12-27 MED ORDER — GLUCERNA PO LIQD: 1.0000 | Freq: Two times a day (BID) | ORAL | Status: DC

## 2013-12-27 MED ORDER — ONDANSETRON HCL 4 MG PO TABS: 4.0000 mg | ORAL_TABLET | Freq: Four times a day (QID) | ORAL | Status: DC | PRN

## 2013-12-27 MED ORDER — ONDANSETRON HCL 4 MG/2ML IJ SOLN
4.0000 mg | Freq: Three times a day (TID) | INTRAMUSCULAR | Status: AC | PRN
  Administered 2013-12-27: 4 mg via INTRAVENOUS

## 2013-12-27 MED ORDER — DEXTROSE 5 % IV SOLN
2.0000 g | INTRAVENOUS | Status: DC
  Administered 2013-12-27 – 2014-01-01 (×6): 2 g via INTRAVENOUS
  Filled 2013-12-27 (×11): qty 2

## 2013-12-27 MED ORDER — FUROSEMIDE 10 MG/ML IJ SOLN
40.0000 mg | Freq: Once | INTRAMUSCULAR | Status: AC
  Administered 2013-12-27: 40 mg via INTRAVENOUS
  Filled 2013-12-27: qty 4

## 2013-12-27 MED ORDER — LIDOCAINE 5 % EX PTCH
1.0000 | MEDICATED_PATCH | CUTANEOUS | Status: DC
  Administered 2013-12-28 – 2013-12-31 (×4): 1 via TRANSDERMAL
  Filled 2013-12-27 (×5): qty 1

## 2013-12-27 MED ORDER — HYDRALAZINE HCL 50 MG PO TABS
50.0000 mg | ORAL_TABLET | Freq: Four times a day (QID) | ORAL | Status: DC
  Administered 2013-12-27 – 2014-01-01 (×21): 50 mg via ORAL
  Filled 2013-12-27 (×23): qty 1

## 2013-12-27 MED ORDER — HYDROCODONE-ACETAMINOPHEN 10-325 MG PO TABS
1.0000 | ORAL_TABLET | Freq: Four times a day (QID) | ORAL | Status: DC | PRN
Start: 2013-12-27 — End: 2014-01-01
  Administered 2013-12-28 – 2013-12-29 (×2): 1 via ORAL
  Administered 2013-12-29 – 2014-01-01 (×6): 2 via ORAL
  Filled 2013-12-27: qty 1
  Filled 2013-12-27 (×6): qty 2
  Filled 2013-12-27: qty 1

## 2013-12-27 MED ORDER — INSULIN ASPART 100 UNIT/ML ~~LOC~~ SOLN
0.0000 [IU] | Freq: Three times a day (TID) | SUBCUTANEOUS | Status: DC
  Administered 2013-12-27 – 2013-12-29 (×5): 2 [IU] via SUBCUTANEOUS
  Administered 2013-12-29: 1 [IU] via SUBCUTANEOUS
  Administered 2013-12-30: 2 [IU] via SUBCUTANEOUS
  Administered 2013-12-30: 1 [IU] via SUBCUTANEOUS
  Administered 2013-12-30: 3 [IU] via SUBCUTANEOUS
  Administered 2013-12-31 (×2): 2 [IU] via SUBCUTANEOUS
  Administered 2013-12-31: 1 [IU] via SUBCUTANEOUS
  Administered 2014-01-01 (×2): 2 [IU] via SUBCUTANEOUS

## 2013-12-27 MED ORDER — FUROSEMIDE 10 MG/ML IJ SOLN
40.0000 mg | Freq: Two times a day (BID) | INTRAMUSCULAR | Status: DC
  Administered 2013-12-27 – 2013-12-28 (×2): 40 mg via INTRAVENOUS
  Filled 2013-12-27 (×3): qty 4

## 2013-12-27 MED ORDER — ISOSORBIDE MONONITRATE 15 MG HALF TABLET
15.0000 mg | ORAL_TABLET | Freq: Every day | ORAL | Status: DC
  Administered 2013-12-27 – 2014-01-01 (×6): 15 mg via ORAL
  Filled 2013-12-27 (×6): qty 1

## 2013-12-27 MED ORDER — VANCOMYCIN HCL IN DEXTROSE 750-5 MG/150ML-% IV SOLN
750.0000 mg | INTRAVENOUS | Status: DC
  Administered 2013-12-28 – 2013-12-31 (×4): 750 mg via INTRAVENOUS
  Filled 2013-12-27 (×4): qty 150

## 2013-12-27 MED ORDER — POLYETHYLENE GLYCOL 3350 17 G PO PACK
17.0000 g | PACK | Freq: Every day | ORAL | Status: DC
  Administered 2013-12-28 – 2013-12-31 (×4): 17 g via ORAL
  Filled 2013-12-27 (×5): qty 1

## 2013-12-27 MED ORDER — SODIUM CHLORIDE 0.9 % IJ SOLN
10.0000 mL | INTRAMUSCULAR | Status: DC | PRN
  Administered 2013-12-27 – 2013-12-28 (×2): 10 mL
  Administered 2013-12-28: 20 mL
  Administered 2013-12-29 – 2013-12-31 (×4): 10 mL
  Administered 2014-01-01: 20 mL

## 2013-12-27 MED ORDER — HYDROMORPHONE HCL PF 1 MG/ML IJ SOLN: 1.0000 mg | INTRAMUSCULAR | Status: DC | PRN

## 2013-12-27 MED ORDER — IPRATROPIUM BROMIDE 0.02 % IN SOLN
0.5000 mg | Freq: Once | RESPIRATORY_TRACT | Status: AC
  Administered 2013-12-27: 0.5 mg via RESPIRATORY_TRACT
  Filled 2013-12-27: qty 2.5

## 2013-12-27 MED ORDER — GLUCERNA SHAKE PO LIQD
237.0000 mL | Freq: Two times a day (BID) | ORAL | Status: DC
  Administered 2013-12-28 (×2): 237 mL via ORAL

## 2013-12-27 MED ORDER — SIMVASTATIN 20 MG PO TABS
20.0000 mg | ORAL_TABLET | Freq: Every day | ORAL | Status: DC
  Administered 2013-12-27 – 2013-12-31 (×5): 20 mg via ORAL
  Filled 2013-12-27 (×6): qty 1

## 2013-12-27 MED ORDER — SODIUM CHLORIDE 0.9 % IJ SOLN
3.0000 mL | Freq: Two times a day (BID) | INTRAMUSCULAR | Status: DC
  Administered 2013-12-27 – 2013-12-31 (×6): 3 mL via INTRAVENOUS

## 2013-12-27 MED ORDER — VITAMIN C 500 MG PO TABS
500.0000 mg | ORAL_TABLET | Freq: Every day | ORAL | Status: DC
  Administered 2013-12-28 – 2014-01-01 (×5): 500 mg via ORAL
  Filled 2013-12-27 (×5): qty 1

## 2013-12-27 MED ORDER — ALBUTEROL SULFATE (2.5 MG/3ML) 0.083% IN NEBU: 5.0000 mg | INHALATION_SOLUTION | RESPIRATORY_TRACT | Status: AC | PRN

## 2013-12-27 MED ORDER — ADULT MULTIVITAMIN W/MINERALS CH
1.0000 | ORAL_TABLET | Freq: Every day | ORAL | Status: DC
  Administered 2013-12-28 – 2014-01-01 (×5): 1 via ORAL
  Filled 2013-12-27 (×5): qty 1

## 2013-12-27 MED ORDER — ONDANSETRON HCL 4 MG/2ML IJ SOLN
4.0000 mg | Freq: Four times a day (QID) | INTRAMUSCULAR | Status: DC | PRN
  Administered 2013-12-31: 4 mg via INTRAVENOUS
  Filled 2013-12-27 (×2): qty 2

## 2013-12-27 MED ORDER — HEPARIN SODIUM (PORCINE) 5000 UNIT/ML IJ SOLN
5000.0000 [IU] | Freq: Three times a day (TID) | INTRAMUSCULAR | Status: DC
  Administered 2013-12-27 – 2014-01-01 (×15): 5000 [IU] via SUBCUTANEOUS
  Filled 2013-12-27 (×18): qty 1

## 2013-12-27 MED ORDER — CARVEDILOL 25 MG PO TABS
25.0000 mg | ORAL_TABLET | Freq: Two times a day (BID) | ORAL | Status: DC
  Administered 2013-12-27 – 2014-01-01 (×11): 25 mg via ORAL
  Filled 2013-12-27 (×12): qty 1

## 2013-12-27 MED ORDER — ACETAMINOPHEN 650 MG RE SUPP
650.0000 mg | Freq: Four times a day (QID) | RECTAL | Status: DC | PRN
  Filled 2013-12-27: qty 1

## 2013-12-27 MED ORDER — ACETAMINOPHEN 325 MG PO TABS
650.0000 mg | ORAL_TABLET | Freq: Four times a day (QID) | ORAL | Status: DC | PRN
  Administered 2013-12-28 (×2): 650 mg via ORAL
  Filled 2013-12-27 (×2): qty 2

## 2013-12-27 MED ORDER — IPRATROPIUM-ALBUTEROL 0.5-2.5 (3) MG/3ML IN SOLN
3.0000 mL | RESPIRATORY_TRACT | Status: DC | PRN
  Administered 2013-12-27 – 2013-12-29 (×3): 3 mL via RESPIRATORY_TRACT
  Filled 2013-12-27 (×3): qty 3

## 2013-12-27 MED ORDER — SENNA 8.6 MG PO TABS
2.0000 | ORAL_TABLET | Freq: Every day | ORAL | Status: DC
  Administered 2013-12-28 – 2014-01-01 (×5): 17.2 mg via ORAL
  Filled 2013-12-27 (×6): qty 2

## 2013-12-27 NOTE — ED Notes (Signed)
Per EMS, [facility] called and stated patient had an alter in mental status in the last two days. Per EMS, Patient answered all questions correctly and did not show signs of altered mental status. Upon arrival, patient had some positional expiratory stridor. Lung fields clear upon assessment. Patient has a PICC line in the right arm. History of CKD, Osteomyelitis, CHF, Edema, and HTN. Patient Alert x4. No acute distress noted.

## 2013-12-27 NOTE — Progress Notes (Signed)
ANTIBIOTIC CONSULT NOTE Pharmacy Consult for Vancomycin Indication: Osteomyelitis  Allergies  Allergen Reactions  . Motrin [Ibuprofen] Hives    Patient Measurements: Height: 5\' 1"  (154.9 cm) Weight: 236 lb 8.9 oz (107.3 kg) (bed scale ) IBW/kg (Calculated) : 47.8  Vital Signs: Temp: 98 F (36.7 C) (02/08 2039) Temp src: Oral (02/08 2039) BP: 151/66 mmHg (02/08 2039) Pulse Rate: 92 (02/08 2039) Intake/Output from previous day:   Intake/Output from this shift: Total I/O In: 240 [P.O.:240] Out: -   Labs:  Recent Labs  12/27/13 1020  WBC 8.0  HGB 9.4*  PLT 231  CREATININE 1.47*   Estimated Creatinine Clearance: 33.9 ml/min (by C-G formula based on Cr of 1.47).  Recent Labs  12/27/13 2145  Jennings 26.3*    Assessment: 80 yo Female with OM/discitis for vancomycin  Goal of Therapy:  Vancomycin trough level 15-20 mcg/ml  Plan:  Change vancomycin 750 mg IV q24h F/U renal function  Tedra Coppernoll, Bronson Curb 12/27/2013,10:55 PM

## 2013-12-27 NOTE — H&P (Addendum)
[facility] History and Physical  THI KLICH TFT:[id number] DOB: [birth year] DOA: 12/27/2013  Referring physician:  PCP: No PCP Per Patient  Specialists:   Chief Complaint: SOB  HPI: [name] [name] is a 80 y.o. female with PMH of HTN, HPL, DM, CHF, initially admitted 1/2 for discitis/osteomyelitis on MRI at the T10 and T11 discharged 1/8 then readmitted with HCAP (completed cefepime 7 days on 2/3) presented today with progressive worsening SOB, some PNDs; denies cough, no chest pain, no fever, no nausea, vomiting, no diarrhea; also patient was not taking lasix at SNF;  -patient not ambulatory for few month due to weakness, DJD; per her daughter she was confused at SNF, which completely resolved upon arrival of EMS  Review of Systems: The patient denies anorexia, fever, weight loss,, vision loss, decreased hearing, hoarseness, chest pain, syncope, dyspnea on exertion, peripheral edema, balance deficits, hemoptysis, abdominal pain, melena, hematochezia, severe indigestion/heartburn, hematuria, incontinence, genital sores, muscle weakness, suspicious skin lesions, transient blindness, difficulty walking, depression, unusual weight change, abnormal bleeding, enlarged lymph nodes, angioedema, and breast masses.    Past Medical History  Diagnosis Date  . Hypertension   . Gout   . Hypercholesteremia   . Diabetes mellitus   . CHF (congestive heart failure)   . Renal disorder   . Cancer   . Edema    Past Surgical History  Procedure Laterality Date  . Cesarean section    . Rotator cuff repair    . Tonsillectomy    . Tubal ligation     Social History:  reports that she quit smoking about 34 years ago. She does not have any smokeless tobacco history on file. She reports that she does not drink alcohol or use illicit drugs. SNF where does patient live--home, ALF, SNF? and with whom if at home? No';  Can patient participate in ADLs?  Allergies  Allergen Reactions  . Motrin [Ibuprofen]  Hives    Family History  Problem Relation Age of Onset  . Hypertension Mother   . Hypertension Father   . Hypertension Daughter     (be sure to complete)  Prior to Admission medications   Medication Sig Start Date End Date Taking? Authorizing Provider  acetaminophen (TYLENOL) 325 MG tablet Take 650 mg by mouth every 4 (four) hours as needed for fever. *takes if temperature over 99.5*   Yes Historical Provider, MD  carvedilol (COREG) 25 MG tablet Take 1 tablet (25 mg total) by mouth 2 (two) times daily with a meal. 12/21/13  Yes Cherene Altes, MD  dextrose 5 % SOLN 50 mL with cefTRIAXone 2 G SOLR 2 g Inject 2 g into the vein daily. X 6 WEEKS, till 01/03/14 12/23/13  Yes Cherene Altes, MD  hydrALAZINE (APRESOLINE) 50 MG tablet Take 50 mg by mouth 4 (four) times daily.   Yes Historical Provider, MD  HYDROcodone-acetaminophen (NORCO) 10-325 MG per tablet Take 1-2 tablets by mouth every 6 (six) hours as needed for moderate pain. 12/21/13  Yes Cherene Altes, MD  ipratropium-albuterol (DUONEB) 0.5-2.5 (3) MG/3ML SOLN Take 3 mLs by nebulization every 2 (two) hours as needed (shortness of breath).   Yes Historical Provider, MD  lidocaine (LIDODERM) 5 % Place 1 patch onto the skin daily. Remove & Discard patch within 12 hours or as directed by MD   Yes Historical Provider, MD  Multiple Vitamins-Minerals (MULTIVITAMIN WITH MINERALS) tablet Take 1 tablet by mouth daily.   Yes Historical Provider, MD  polyethylene glycol (MIRALAX /  GLYCOLAX) packet Take 17 g by mouth daily.   Yes Historical Provider, MD  senna (SENOKOT) 8.6 MG TABS tablet Take 2 tablets by mouth daily.   Yes Historical Provider, MD  simvastatin (ZOCOR) 20 MG tablet Take 20 mg by mouth daily at 6 PM.   Yes Historical Provider, MD  sodium chloride 0.9 % injection 10-40 mLs by Intracatheter route as needed (flush). 11/26/13  Yes Ripudeep K Rai, MD  sodium chloride 0.9 % SOLN 250 mL with vancomycin 1000 MG SOLR 1,000 mg Inject 1,000 mg  into the vein daily. *at 2100*   Yes Historical Provider, MD  traMADol (ULTRAM) 50 MG tablet Take 1 tablet (50 mg total) by mouth every 4 (four) hours as needed (for breakthrough pain). 12/21/13  Yes Lonia Blood, MD  vitamin C (ASCORBIC ACID) 500 MG tablet Take 500 mg by mouth daily.   Yes Historical Provider, MD   Physical Exam: Filed Vitals:   12/27/13 1157  BP: 95/64  Pulse: 90  Resp: 20     General:  alert  Eyes: EOM-i,  ENT: no oral ulcers  Neck: supple   Cardiovascular: s1s,2 rrr  Respiratory: LL crackles   Abdomen: soft, obese, nt  Skin: no rash  Musculoskeletal: mild LE edema  Psychiatric: no hallucinations   Neurologic: alert, CN 2-12 intact, motor 5/5 BL UE; 3/5 LE symmetric   Labs on Admission:  Basic Metabolic Panel:  Recent Labs Lab 12/21/13 0332 12/27/13 1020  NA 136* 137  K 4.1 5.5*  CL 100 101  CO2 25 24  GLUCOSE 145* 182*  BUN 32* 27*  CREATININE 1.32* 1.47*  CALCIUM 10.2 10.9*   Liver Function Tests: No results found for this basename: AST, ALT, ALKPHOS, BILITOT, PROT, ALBUMIN,  in the last 168 hours No results found for this basename: LIPASE, AMYLASE,  in the last 168 hours No results found for this basename: AMMONIA,  in the last 168 hours CBC:  Recent Labs Lab 12/27/13 1020  WBC 8.0  NEUTROABS 5.8  HGB 9.4*  HCT 30.1*  MCV 84.6  PLT 231   Cardiac Enzymes:  Recent Labs Lab 12/27/13 1020  TROPONINI <0.30    BNP (last 3 results)  Recent Labs  11/25/13 0535 12/16/13 1812 12/27/13 1020  PROBNP 1142.0* 11565.0* 7200.0*   CBG:  Recent Labs Lab 12/20/13 1704 12/20/13 2037 12/21/13 0749 12/21/13 1150 12/21/13 1604  GLUCAP 134* 130* 180* 164* 180*    Radiological Exams on Admission: Dg Chest 2 View  12/27/2013   CLINICAL DATA:  Congestive failure  EXAM: CHEST  2 VIEW  COMPARISON:  12/20/2013  FINDINGS: Right-sided PICC line is again identified and stable. Cardiomegaly with diffuse vascular congestion and  interstitial infiltrates is identified. The overall appearance is stable. No new focal abnormality is seen. Degenerative changes of the shoulder joints are noted bilaterally.  IMPRESSION: No change from the prior exam.   Electronically Signed   By: Alcide Clever M.D.   On: 12/27/2013 10:37    EKG: Independently reviewed. NSR, old MI  Assessment/Plan Active Problems:   CHF exacerbation   80 y.o. female with PMH of HTN, HPL, DM, CHF, initially admitted 1/2 for discitis/osteomyelitis on MRI at the T10 and T11 discharged 1/8 then readmitted with HCAP (completed cefepime 7 days on 2/3) presented today with progressive worsening SOB is admitted with CHF  1. Acute on chronic CHF likely diastolic HF; CXR: BL pulmonary edema; echo (11/2013): LVEF 65%, LVH, diastolic dysfunction; patient was not on  lasix at SNF -start IV diuresis, daily weight, I/O, expect worsening renal function due to CKD, close monitor; cont BB, Hydrazine add NTG; not on ACE; NiPPV as needed  2. Recent HCAP completed IV atx (cefepime 7 days on 2/3) -unlikely recurrent HCAP due to lack of clinical symptoms; but CXR still persistent infiltrates; obtain CT for better evaluation   3. Discitis/osteomyelitis on MRI at the T10 and T11; on IV atx (ceftiaxone+Vanc) per ID till 2/18  4. DM last HA1C 6.8; not on meds; cont ISS while inpatient   5. Anemia chronic; no s/s of acute bleeding;   6. CKD III, mild hyper K cont diuresis, recheck renal function in AM   None;  if consultant consulted, please document name and whether formally or informally consulted  Code Status: full  (must indicate code status--if unknown or must be presumed, indicate so) Family Communication: d/w patient, her daughter [phone] (indicate person spoken with, if applicable, with phone number if by telephone) Disposition Plan: snf when stable  (indicate anticipated LOS)  Time spent: >35 minutes   [place], [place] [facility] Pager [phone]  If  7PM-7AM, please contact night-coverage [email or web address] Password [facility] 12/27/2013, 12:13 PM

## 2013-12-27 NOTE — ED Notes (Signed)
Respiratory Therapy at the bedside.  

## 2013-12-27 NOTE — ED Provider Notes (Signed)
Medical screening examination/treatment/procedure(s) were conducted as a shared visit with non-physician practitioner(s) and myself.  I personally evaluated the patient during the encounter.  EKG Interpretation    Date/Time:  Sunday December 27 2013 09:53:32 EST Ventricular Rate:  99 PR Interval:  187 QRS Duration: 88 QT Interval:  332 QTC Calculation: 426 R Axis:   4 Text Interpretation:  Sinus rhythm Probable left atrial enlargement Anterior infarct, old No significant change since last tracing Confirmed by Arlen Dupuis  MD-J, Timesha Cervantez ([id number]) on 12/27/2013 10:24:48 AM            Pt presents with recurrent shortness of breath form her nursing facility.  On exam she is alert and oriented.  Noted to have crackles on exam.  Few end exp wheezes.  CXR and labs consistent with recurrent pulmonary edema.  Will order lasix admit for further treatment.  Kathalene Frames, MD 12/27/13 [phone]

## 2013-12-27 NOTE — Progress Notes (Signed)
ANTIBIOTIC CONSULT NOTE - INITIAL  Pharmacy Consult for Vancomycin Indication: Osteomyelitis  Allergies  Allergen Reactions  . Motrin [Ibuprofen] Hives    Patient Measurements: Height: 5\' 1"  (154.9 cm) Weight: 236 lb 8.9 oz (107.3 kg) (bed scale ) IBW/kg (Calculated) : 47.8  Vital Signs: Temp: 98 F (36.7 C) (02/08 1307) Temp src: Oral (02/08 1307) BP: 163/70 mmHg (02/08 1307) Pulse Rate: 94 (02/08 1307) Intake/Output from previous day:   Intake/Output from this shift:    Labs:  Recent Labs  12/27/13 1020  WBC 8.0  HGB 9.4*  PLT 231  CREATININE 1.47*   Estimated Creatinine Clearance: 33.9 ml/min (by C-G formula based on Cr of 1.47). No results found for this basename: Letta Median, VANCORANDOM, [place], GENTPEAK, [place], [place], TOBRAPEAK, TOBRARND, AMIKACINPEAK, AMIKACINTROU, AMIKACIN,  in the last 72 hours   Microbiology: Recent Results (from the past 720 hour(s))  URINE CULTURE     Status: None   Collection Time    12/16/13  5:47 PM      Result Value Range Status   Specimen Description URINE, CATHETERIZED   Final   Special Requests NONE   Final   Culture  Setup Time     Final   Value: 12/16/2013 19:12     Performed at [facility]     Final   Value: NO GROWTH     Performed at [facility]   Culture     Final   Value: NO GROWTH     Performed at [facility]   Report Status 12/17/2013 FINAL   Final  CULTURE, BLOOD (ROUTINE X 2)     Status: None   Collection Time    12/16/13 11:14 PM      Result Value Range Status   Specimen Description BLOOD PICC LINE   Final   Special Requests BOTTLES DRAWN AEROBIC ONLY 10CC   Final   Culture  Setup Time     Final   Value: 12/17/2013 03:34     Performed at [facility]   Culture     Final   Value: NO GROWTH 5 DAYS     Performed at [facility]   Report Status 12/23/2013 FINAL   Final  MRSA PCR SCREENING     Status: None   Collection  Time    12/17/13 12:27 AM      Result Value Range Status   MRSA by PCR NEGATIVE  NEGATIVE Final   Comment:            The GeneXpert MRSA Assay (FDA     approved for NASAL specimens     only), is one component of a     comprehensive MRSA colonization     surveillance program. It is not     intended to diagnose MRSA     infection nor to guide or     monitor treatment for     MRSA infections.    Medical History: Past Medical History  Diagnosis Date  . Hypertension   . Gout   . Hypercholesteremia   . Diabetes mellitus   . CHF (congestive heart failure)   . Renal disorder   . Cancer   . Edema     Medications:  Scheduled:  . carvedilol  25 mg Oral BID WC  . cefTRIAXone (ROCEPHIN) IVPB 2 gram/50 mL D5W (Pyxis)  2 g Intravenous Q24H  . furosemide  40 mg Intravenous Q12H  . heparin  5,000 Units Subcutaneous Q8H  . hydrALAZINE  50 mg Oral QID  . insulin aspart  0-9 Units Subcutaneous TID WC  . isosorbide mononitrate  15 mg Oral Daily  . [START ON 12/28/2013] lidocaine  1 patch Transdermal Q24H  . [START ON 12/28/2013] multivitamin with minerals  1 tablet Oral Daily  . [START ON 12/28/2013] polyethylene glycol  17 g Oral Daily  . senna  2 tablet Oral Daily  . simvastatin  20 mg Oral q1800  . sodium chloride  3 mL Intravenous Q12H  . [START ON 12/28/2013] vitamin C  500 mg Oral Daily   Assessment: 80 yo F presented from SNF w/ SOB. Patient had recent admission at the end of January for CHF exacerbation and HCAP was treated w/ Cefepime x7 days (compeleted 2/3). Patient has OM/discitis of thoracic spine at T10 and T11, was sent to SNF with vancomycin 1g IV q24h. Looking back at pharmacy notes in January looks like we dosed patient at 1.5g IV   Goal of Therapy:  Vancomycin trough level 15-20 mcg/ml  Plan:  - Pending VT @ 2030 will dose Vancomycin - Follow- up on renal function and clinical progression  Angelica Chessman 12/27/2013,2:34 PM

## 2013-12-27 NOTE — ED Provider Notes (Signed)
CSN: [id number]     Arrival date & time 12/27/13  0945 History   First MD Initiated Contact with Patient 12/27/13 [phone]     Chief Complaint  Patient presents with  . Shortness of Breath   (Consider location/radiation/quality/duration/timing/severity/associated sxs/prior Treatment) Patient is a 80 y.o. female presenting with shortness of breath. The history is provided by the patient and medical records.  Shortness of Breath  This is an 80 year old female with past history significant for hypertension, hyperlipidemia, diabetes, congestive heart failure, chronic kidney disease, presenting to the ED from [facility] via EMS for shortness of breath. Initially EMS was called for altered mental status, upon EMS arrival patient was alert and oriented without signs of altered mental status or neurologic deficit. On arrival to the ED patient remains at her baseline, and is able to answer all questions appropriately. She states over the past several days she has had difficulty breathing, and feels a though she cannot get a full breath.  Denies associated chest pain, palpitations, dizziness, weakness, headaches, confusion, or feelings of syncope.  Pt is on 2L via  at all times.  States she is supposed to use CPAP machine while sleeping but does not wear it.  Patient was hospitalized at the end of January for CHF exacerbation and was told she might have a slight pneumonia. Patient is currently on vancomycin via PICC line for osteomyelitis of her thoracic spine.  States her back pain is at baseline. She denies any numbness or paresthesias of extremities. No loss of bowel or bladder control.  Past Medical History  Diagnosis Date  . Hypertension   . Gout   . Hypercholesteremia   . Diabetes mellitus   . CHF (congestive heart failure)   . Renal disorder   . Cancer   . Edema    Past Surgical History  Procedure Laterality Date  . Cesarean section    . Rotator cuff repair    . Tonsillectomy     . Tubal ligation     Family History  Problem Relation Age of Onset  . Hypertension Mother   . Hypertension Father   . Hypertension Daughter    History  Substance Use Topics  . Smoking status: Former Smoker    Quit date: 12/17/1979  . Smokeless tobacco: Not on file  . Alcohol Use: No   OB History   Grav Para Term Preterm Abortions TAB SAB Ect Mult Living                 Review of Systems  Respiratory: Positive for shortness of breath.   All other systems reviewed and are negative.    Allergies  Motrin  Home Medications   Current Outpatient Rx  Name  Route  Sig  Dispense  Refill  . acetaminophen (TYLENOL) 325 MG tablet   Oral   Take 650 mg by mouth every 4 (four) hours as needed for fever. *takes if temperature over 99.5*         . carvedilol (COREG) 25 MG tablet   Oral   Take 1 tablet (25 mg total) by mouth 2 (two) times daily with a meal.         . dextrose 5 % SOLN 50 mL with cefTRIAXone 2 G SOLR 2 g   Intravenous   Inject 2 g into the vein daily. X 6 WEEKS, till 01/03/14         . feeding supplement, GLUCERNA SHAKE, (GLUCERNA SHAKE) LIQD   Oral  Take 237 mLs by mouth 2 (two) times daily between meals.         . hydrALAZINE (APRESOLINE) 50 MG tablet   Oral   Take 50 mg by mouth 4 (four) times daily.         Marland Kitchen HYDROcodone-acetaminophen (NORCO) 10-325 MG per tablet   Oral   Take 1-2 tablets by mouth every 6 (six) hours as needed for moderate pain.   30 tablet   0   . ipratropium-albuterol (DUONEB) 0.5-2.5 (3) MG/3ML SOLN   Nebulization   Take 3 mLs by nebulization every 2 (two) hours as needed.   360 mL      . lidocaine (LIDODERM) 5 %   Transdermal   Place 1 patch onto the skin daily. Remove & Discard patch within 12 hours or as directed by MD         . Multiple Vitamins-Minerals (MULTIVITAMIN WITH MINERALS) tablet   Oral   Take 1 tablet by mouth daily.         . polyethylene glycol (MIRALAX / GLYCOLAX) packet   Oral   Take  17 g by mouth daily.         Marland Kitchen senna (SENOKOT) 8.6 MG TABS tablet   Oral   Take 2 tablets by mouth daily.         . simvastatin (ZOCOR) 20 MG tablet   Oral   Take 20 mg by mouth daily at 6 PM.         . sodium chloride 0.9 % injection   Intracatheter   10-40 mLs by Intracatheter route as needed (flush).   5 mL      . sodium chloride 0.9 % SOLN 250 mL with vancomycin 1000 MG SOLR 1,000 mg   Intravenous   Inject 1,000 mg into the vein daily. *at 2100*         . traMADol (ULTRAM) 50 MG tablet   Oral   Take 1 tablet (50 mg total) by mouth every 4 (four) hours as needed (for breakthrough pain).   30 tablet   0   . vitamin C (ASCORBIC ACID) 500 MG tablet   Oral   Take 500 mg by mouth daily.          BP 138/66  Pulse 97  Resp 20  SpO2 98%  Physical Exam  Nursing note and vitals reviewed. Constitutional: She is oriented to person, place, and time. She appears well-developed and well-nourished. No distress.  HENT:  Head: Normocephalic and atraumatic.  Mouth/Throat: Oropharynx is clear and moist.  Airway patent, no edema noted  Eyes: Conjunctivae and EOM are normal. Pupils are equal, round, and reactive to light.  Neck: Normal range of motion.  Cardiovascular: Normal rate, regular rhythm and normal heart sounds.   Pulmonary/Chest: Effort normal. She has wheezes. She has no rhonchi. She has no rales.  Mildly increased work of breathing, Diffuse inspiratory and expiratory wheezes; no audible rhonchi or rales, pt able to speak in full complete sentences without difficulty  Abdominal: Soft. Bowel sounds are normal. There is no tenderness. There is no guarding.  Musculoskeletal: Normal range of motion.  2+ pitting edema BLE  Neurological: She is alert and oriented to person, place, and time. She has normal strength. She displays no tremor. No cranial nerve deficit or sensory deficit. She displays no seizure activity.  AAOx3, answering questions appropriately; equal  strength UE and LE bilaterally; CN grossly intact; moves all extremities appropriately without ataxia; no focal neuro deficits  or facial asymmetry appreciated  Skin: Skin is warm and dry. She is not diaphoretic.  Psychiatric: She has a normal mood and affect.    ED Course  Procedures (including critical care time) Labs Review Labs Reviewed  CBC WITH DIFFERENTIAL - Abnormal; Notable for the following:    RBC 3.56 (*)    Hemoglobin 9.4 (*)    HCT 30.1 (*)    All other components within normal limits  BASIC METABOLIC PANEL - Abnormal; Notable for the following:    Potassium 5.5 (*)    Glucose, Bld 182 (*)    BUN 27 (*)    Creatinine, Ser 1.47 (*)    Calcium 10.9 (*)    GFR calc non Af Amer 32 (*)    GFR calc Af Amer 37 (*)    All other components within normal limits  PRO B NATRIURETIC PEPTIDE - Abnormal; Notable for the following:    Pro B Natriuretic peptide (BNP) 7200.0 (*)    All other components within normal limits  TROPONIN I   Imaging Review Dg Chest 2 View  12/27/2013   CLINICAL DATA:  Congestive failure  EXAM: CHEST  2 VIEW  COMPARISON:  12/20/2013  FINDINGS: Right-sided PICC line is again identified and stable. Cardiomegaly with diffuse vascular congestion and interstitial infiltrates is identified. The overall appearance is stable. No new focal abnormality is seen. Degenerative changes of the shoulder joints are noted bilaterally.  IMPRESSION: No change from the prior exam.   Electronically Signed   By: Inez Catalina M.D.   On: 12/27/2013 10:37    EKG Interpretation    Date/Time:  Sunday December 27 2013 09:53:32 EST Ventricular Rate:  99 PR Interval:  187 QRS Duration: 88 QT Interval:  332 QTC Calculation: 426 R Axis:   4 Text Interpretation:  Sinus rhythm Probable left atrial enlargement Anterior infarct, old No significant change since last tracing Confirmed by KNAPP  MD-J, JON ([id number]) on 12/27/2013 10:24:48 AM            MDM   1. CHF exacerbation   2.  Shortness of breath   3. Hyperkalemia   4. CKD (chronic kidney disease) stage 4, GFR 15-29 ml/min   5. HTN (hypertension)   6. DM (diabetes mellitus)   7. Osteomyelitis of thoracic spine   8. CHF (congestive heart failure)   9. Constipation   10. HCAP (healthcare-associated pneumonia)     On initial evaluation patient alert and oriented x3, answering all questions appropriately. Her only complaint is shortness of breath and back pain which is unchanged from her baseline.  She has no signs or symptoms concerning for cauda equina at this time.  Will obtain basic labs, CXR.  Neb tx given.  Will reassess.  Still has some mild wheezing present.  Daughter present in room-- states her AMS that she was mentioning was due to the face that last night pt felt as though she could walk but is normally not ambulatory at baseline.  Notes she also appears sleepy and somewhat subdued compared with her baseline. Labs as above-- BNP elevated at 7200, renal function at baseline.  CXR consistent with recurrent CHF exacerbation.  Will plan to diurese and admit for further management.    Discussed with Dr. Daleen Bo of [facility] who will admit to telemetry.  Temp admission orders placed.    Larene Pickett, PA-C 12/27/13 1308

## 2013-12-27 NOTE — Progress Notes (Signed)
Pt. Refused cpap. Pt. States she will notify if she changes her mind 

## 2013-12-27 NOTE — Progress Notes (Signed)
The RN attempted to insert the Foley catheter at 2030.  The patient refused.

## 2013-12-28 ENCOUNTER — Inpatient Hospital Stay (HOSPITAL_COMMUNITY): Payer: Medicare HMO

## 2013-12-28 ENCOUNTER — Encounter: Payer: Self-pay | Admitting: Internal Medicine

## 2013-12-28 DIAGNOSIS — N179 Acute kidney failure, unspecified: Secondary | ICD-10-CM

## 2013-12-28 DIAGNOSIS — I119 Hypertensive heart disease without heart failure: Secondary | ICD-10-CM

## 2013-12-28 DIAGNOSIS — I5031 Acute diastolic (congestive) heart failure: Secondary | ICD-10-CM

## 2013-12-28 DIAGNOSIS — N183 Chronic kidney disease, stage 3 unspecified: Secondary | ICD-10-CM | POA: Diagnosis present

## 2013-12-28 HISTORY — DX: Hypertensive heart disease without heart failure: I11.9

## 2013-12-28 LAB — CBC
HCT: 25.8 % — ABNORMAL LOW (ref 36.0–46.0)
Hemoglobin: 8.2 g/dL — ABNORMAL LOW (ref 12.0–15.0)
MCH: 27 pg (ref 26.0–34.0)
MCHC: 31.8 g/dL (ref 30.0–36.0)
MCV: 84.9 fL (ref 78.0–100.0)
Platelets: 231 K/uL (ref 150–400)
RBC: 3.04 MIL/uL — ABNORMAL LOW (ref 3.87–5.11)
RDW: 15.5 % (ref 11.5–15.5)
WBC: 7 K/uL (ref 4.0–10.5)

## 2013-12-28 LAB — GLUCOSE, CAPILLARY
GLUCOSE-CAPILLARY: 112 mg/dL — AB (ref 70–99)
GLUCOSE-CAPILLARY: 173 mg/dL — AB (ref 70–99)
GLUCOSE-CAPILLARY: 156 mg/dL — AB (ref 70–99)
GLUCOSE-CAPILLARY: 149 mg/dL — AB (ref 70–99)

## 2013-12-28 LAB — BASIC METABOLIC PANEL WITH GFR
BUN: 28 mg/dL — ABNORMAL HIGH (ref 6–23)
CO2: 26 meq/L (ref 19–32)
Calcium: 10.6 mg/dL — ABNORMAL HIGH (ref 8.4–10.5)
Chloride: 102 meq/L (ref 96–112)
Creatinine, Ser: 1.7 mg/dL — ABNORMAL HIGH (ref 0.50–1.10)
GFR calc Af Amer: 31 mL/min — ABNORMAL LOW
GFR calc non Af Amer: 27 mL/min — ABNORMAL LOW
Glucose, Bld: 126 mg/dL — ABNORMAL HIGH (ref 70–99)
Potassium: 5 meq/L (ref 3.7–5.3)
Sodium: 137 meq/L (ref 137–147)

## 2013-12-28 LAB — ANGIOTENSIN CONVERTING ENZYME: Angiotensin-Converting Enzyme: 53 U/L — ABNORMAL HIGH (ref 8–52)

## 2013-12-28 LAB — TSH: TSH: 1.291 u[IU]/mL (ref 0.350–4.500)

## 2013-12-28 MED ORDER — FUROSEMIDE 10 MG/ML IJ SOLN: 40.0000 mg | Freq: Two times a day (BID) | INTRAMUSCULAR | Status: DC

## 2013-12-28 MED ORDER — FUROSEMIDE 10 MG/ML IJ SOLN
60.0000 mg | Freq: Two times a day (BID) | INTRAMUSCULAR | Status: DC
  Administered 2013-12-28 – 2014-01-01 (×9): 60 mg via INTRAVENOUS
  Filled 2013-12-28 (×9): qty 6

## 2013-12-28 MED ORDER — BOOST / RESOURCE BREEZE PO LIQD
1.0000 | Freq: Two times a day (BID) | ORAL | Status: DC
  Administered 2013-12-28 – 2013-12-31 (×5): 1 via ORAL

## 2013-12-28 MED ORDER — METOLAZONE 2.5 MG PO TABS
2.5000 mg | ORAL_TABLET | Freq: Once | ORAL | Status: AC
  Administered 2013-12-28: 2.5 mg via ORAL
  Filled 2013-12-28: qty 1

## 2013-12-28 MED ORDER — ENSURE PUDDING PO PUDG
1.0000 | Freq: Three times a day (TID) | ORAL | Status: DC
  Administered 2013-12-28 – 2014-01-01 (×10): 1 via ORAL

## 2013-12-28 MED ORDER — BIOTENE DRY MOUTH MT LIQD
15.0000 mL | Freq: Two times a day (BID) | OROMUCOSAL | Status: DC
  Administered 2013-12-28 – 2014-01-01 (×9): 15 mL via OROMUCOSAL

## 2013-12-28 NOTE — Progress Notes (Signed)
After an unsuccessful urination attempt on the bedpan, the patient was bladder scanned and 260cc of urine was in her bladder.  She allowed [place] to insert the Foley catheter.

## 2013-12-28 NOTE — Progress Notes (Signed)
INITIAL NUTRITION ASSESSMENT  DOCUMENTATION CODES Per approved criteria  -Morbid Obesity   INTERVENTION: 1.  Modify diet; liberalize to Regular 2.  Supplements; offer Ensure complete or Ensure pudding with medications  NUTRITION DIAGNOSIS: Inadequate oral intake related to poor appetite as evidenced by family report.   Monitor:  1.  Food/Beverage; pt meeting >/=90% estimated needs with tolerance. 2.  Wt/wt change; monitor trends  Reason for Assessment: consult  80 y.o. female  Admitting Dx: shortness of breath  ASSESSMENT: Pt admitted with shortness of breath. Pt with recent illness which has been ongoing since jan 1 per grandson.  Pt started losing her appetite ~3 months ago. Per grandson pt has been eating 1/3 of her usual intake. She often skips meals and frequently complains of not being hungry. Discussed nutrition goals and ways to achieve.  Recommend Regular diet to encourage high calorie, high protein foods.  Will also order supplements for med administration.   Nutrition Focused Physical Exam: Subcutaneous Fat:  Orbital Region: WNL Upper Arm Region: WNL Thoracic and Lumbar Region: WNL  Muscle:  Temple Region: mild wasting Clavicle Bone Region: WNL Clavicle and Acromion Bone Region: WNL Scapular Bone Region: WNL Dorsal Hand: WNL Patellar Region: no assessed Anterior Thigh Region: not assessed Posterior Calf Region: not assessed  Edema: none present  RD suspects some level of undernutrition given reports of poor PO for several months.  Pt states she consumed 75% of her meal for lunch, but barely ate any breakfast this AM.  She has an Ensure Complete at bedside which is largely untouched.  Her wt is stable, her physical exam does not reveal any wasting.   Height: Ht Readings from Last 1 Encounters:  12/27/13 5\' 1"  (1.549 m)    Weight: Wt Readings from Last 1 Encounters:  12/28/13 231 lb 7.7 oz (105 kg)    Ideal Body Weight: 105 lbs  % Ideal Body  Weight: 220%  Wt Readings from Last 10 Encounters:  12/28/13 231 lb 7.7 oz (105 kg)  12/22/13 232 lb 11.2 oz (105.552 kg)  12/19/13 233 lb 7.5 oz (105.9 kg)  12/16/13 228 lb (103.42 kg)  12/10/13 228 lb (103.42 kg)  11/27/13 228 lb (103.42 kg)  11/20/13 228 lb 6.3 oz (103.6 kg)    Usual Body Weight: 230 lbs  % Usual Body Weight: 100%  BMI:  Body mass index is 43.76 kg/(m^2).  Estimated Nutritional Needs: Kcal: 1500-1700 Protein: 75-90g Fluid: ~2.0 L/day  Skin: intact  Diet Order: Carb Control  EDUCATION NEEDS: -Education needs addressed   Intake/Output Summary (Last 24 hours) at 12/28/13 1454 Last data filed at 12/28/13 1357  Gross per 24 hour  Intake    480 ml  Output   1925 ml  Net  -1445 ml    Last BM: 2/7  Labs:   Recent Labs Lab 12/27/13 1020 12/28/13 0513  NA 137 137  K 5.5* 5.0  CL 101 102  CO2 24 26  BUN 27* 28*  CREATININE 1.47* 1.70*  CALCIUM 10.9* 10.6*  GLUCOSE 182* 126*    CBG (last 3)   Recent Labs  12/27/13 2214 12/28/13 0610 12/28/13 1135  GLUCAP 143* 112* 173*    Scheduled Meds: . antiseptic oral rinse  15 mL Mouth Rinse BID  . carvedilol  25 mg Oral BID WC  . cefTRIAXone (ROCEPHIN) IVPB 2 gram/50 mL D5W (Pyxis)  2 g Intravenous Q24H  . feeding supplement (GLUCERNA SHAKE)  237 mL Oral BID BM  . furosemide  60 mg Intravenous BID  . heparin  5,000 Units Subcutaneous Q8H  . hydrALAZINE  50 mg Oral QID  . insulin aspart  0-9 Units Subcutaneous TID WC  . isosorbide mononitrate  15 mg Oral Daily  . lidocaine  1 patch Transdermal Q24H  . multivitamin with minerals  1 tablet Oral Daily  . polyethylene glycol  17 g Oral Daily  . senna  2 tablet Oral Daily  . simvastatin  20 mg Oral q1800  . sodium chloride  3 mL Intravenous Q12H  . vancomycin  750 mg Intravenous Q24H  . vitamin C  500 mg Oral Daily    Continuous Infusions:   Past Medical History  Diagnosis Date  . Hypertension   . Gout   . Hypercholesteremia   .  Diabetes mellitus   . CHF (congestive heart failure)   . Renal disorder   . Cancer   . Edema     Past Surgical History  Procedure Laterality Date  . Cesarean section    . Rotator cuff repair    . Tonsillectomy    . Tubal ligation      Brynda Greathouse, MS RD LDN Clinical Inpatient Dietitian Pager: [phone] Weekend/After hours pager: [phone]

## 2013-12-28 NOTE — Progress Notes (Signed)
[facility] PROGRESS NOTE Interim History: 80 y.o. female with PMH of HTN, HPL, DM, CHF, initially admitted 1/2 for discitis/osteomyelitis on MRI at the T10 and T11 discharged 1/8 then readmitted with HCAP (completed cefepime 7 days on 2/3) presented today with progressive worsening SOB, some PNDs; denies cough, no chest pain, no fever, no nausea, vomiting, no diarrhea; also patient was not taking lasix at SNF;    Assessment/Plan:  Acute diastolic CHF (congestive heart failure), NYHA class 4 - Cont IV lasix give low dose metolazone. - 107.3 kg ->105 kg, sluggish urine output. - Srict I and O;s. Daily weights low sodium diet. + JVD, lower extremity edema. - Cont coreg avoid ACE for now. Cont Imdur. - Fluid restrict diet.  Hypertensive heart disease: - Bp improve, cont Coreg, Imdur  Chronic renal disease stage III: - Baseline Cr 1.6-1.9. Monitor daily. - Cont IV lasix on Imdur.   Hypercalcemia: - Unclear etiology ? If causing renal damage. - check ACE level, ct scan shows diffuse interstitial pattern with lymphadeopathy doubt infectious in nature she has been on Vanc and rocephin for 4 weeks now. - Check PTH, 25-OH, 1-25 OH vit D and TSH.    Code Status: full  Family Communication: d/w patient, her daughter [phone] Disposition Plan: snf when stable     Consultants:  none  Procedures:  Ct CHEST  Antibiotics:  VANC AND ROCEPHIN END DATE 2.18.2015 FOR DISKITIS  (indicate start date, and stop date if known)  HPI/Subjective: No complains, hungry. Pt not reliable, no insight on her condition. When ask about why she is here she act indifferent.  Objective: Filed Vitals:   12/27/13 1832 12/27/13 2039 12/28/13 0414 12/28/13 0632  BP:  151/66  149/60  Pulse:  92  97  Temp:  98 F (36.7 C)  98.4 F (36.9 C)  TempSrc:  Oral  Oral  Resp:  20  21  Height:      Weight:    105 kg (231 lb 7.7 oz)  SpO2: 96% 98% 99% 93%    Intake/Output Summary (Last 24 hours)  at 12/28/13 0842 Last data filed at 12/28/13 0634  Gross per 24 hour  Intake    240 ml  Output   1150 ml  Net   -910 ml   Filed Weights   12/27/13 1307 12/28/13 0938  Weight: 107.3 kg (236 lb 8.9 oz) 105 kg (231 lb 7.7 oz)    Exam:  General: Alert, awake, oriented x3, in no acute distress.  HEENT: No bruits, no goiter. + JVD Heart: Regular rate and rhythm, without murmurs, rubs, gallops.  Lungs: Good air movement, crackle B/L Abdomen: Soft, nontender, nondistended, positive bowel sounds.     Data Reviewed: Basic Metabolic Panel:  Recent Labs Lab 12/27/13 1020 12/28/13 0513  NA 137 137  K 5.5* 5.0  CL 101 102  CO2 24 26  GLUCOSE 182* 126*  BUN 27* 28*  CREATININE 1.47* 1.70*  CALCIUM 10.9* 10.6*   Liver Function Tests: No results found for this basename: AST, ALT, ALKPHOS, BILITOT, PROT, ALBUMIN,  in the last 168 hours No results found for this basename: LIPASE, AMYLASE,  in the last 168 hours No results found for this basename: AMMONIA,  in the last 168 hours CBC:  Recent Labs Lab 12/27/13 1020 12/28/13 0513  WBC 8.0 7.0  NEUTROABS 5.8  --   HGB 9.4* 8.2*  HCT 30.1* 25.8*  MCV 84.6 84.9  PLT 231 231   Cardiac Enzymes:  Recent  Labs Lab 12/27/13 1020  TROPONINI <0.30   BNP (last 3 results)  Recent Labs  11/25/13 0535 12/16/13 1812 12/27/13 1020  PROBNP 1142.0* 11565.0* 7200.0*   CBG:  Recent Labs Lab 12/21/13 1604 12/27/13 1343 12/27/13 1625 12/27/13 2214 12/28/13 0610  GLUCAP 180* 148* 166* 143* 112*    No results found for this or any previous visit (from the past 240 hour(s)).   Studies: Dg Chest 2 View  12/27/2013   CLINICAL DATA:  Congestive failure  EXAM: CHEST  2 VIEW  COMPARISON:  12/20/2013  FINDINGS: Right-sided PICC line is again identified and stable. Cardiomegaly with diffuse vascular congestion and interstitial infiltrates is identified. The overall appearance is stable. No new focal abnormality is seen. Degenerative  changes of the shoulder joints are noted bilaterally.  IMPRESSION: No change from the prior exam.   Electronically Signed   By: Inez Catalina M.D.   On: 12/27/2013 10:37   Ct Chest Wo Contrast  12/28/2013   CLINICAL DATA:  Persistent infiltrates.  EXAM: CT CHEST WITHOUT CONTRAST  TECHNIQUE: Multidetector CT imaging of the chest was performed following the standard protocol without IV contrast.  COMPARISON:  DG CHEST 2 VIEW dated 12/27/2013; DG CHEST 1V PORT dated 12/20/2013; DG CHEST 1V PORT dated 12/18/2013; DG CHEST 1V PORT dated 12/16/2013; DG THORACIC SPINE W/SWIMMERS dated 11/19/2013  FINDINGS: There are small moderate bilateral pleural effusions which layer dependently. Cardiomegaly is present with low-attenuation the intravascular compartment suggesting anemia. Incidental imaging of the upper abdomen demonstrates atherosclerosis and cholecystectomy and a left interpolar renal cystic lesion, probably a cyst. . Collapse/ consolidation of both lower lobes associated with effusions. Aortic and branch vessel atherosclerosis. Right upper extremity PICC is present with the tip terminating at the cavoatrial junction.  Lungs demonstrate changes of pulmonary fibrosis, which is apical predominant. There is superimposed airspace disease which is present diffusely. This may represent acute phase interstitial pneumonitis although infection can have a similar appearance.  14 mm prevascular lymph node is present, enlarged which may be reactive. Low right peritracheal lymphadenopathy is present, also measuring 14 mm. Suboptimal evaluation of hilar adenopathy without contrast. Central airways appear patent. Severe bilateral glenohumeral osteoarthritis is present.  IMPRESSION: 1. Apical predominant pulmonary fibrosis with superimposed airspace disease. Airspace disease may represent acute phase interstitial pneumonitis or pneumonia (multifocal pneumonia). Mediastinal adenopathy may be reactive. Adenopathy also raises possibility  sarcoidosis accounting for alveolar opacities and adenopathy. 2. Small to moderate bilateral pleural effusions layering dependently. Collapse/consolidation of both lower lobes associated with the effusions. 3. Cardiomegaly and probable anemia.   Electronically Signed   By: Dereck Ligas M.D.   On: 12/28/2013 01:08    Scheduled Meds: . antiseptic oral rinse  15 mL Mouth Rinse BID  . carvedilol  25 mg Oral BID WC  . cefTRIAXone (ROCEPHIN) IVPB 2 gram/50 mL D5W (Pyxis)  2 g Intravenous Q24H  . feeding supplement (GLUCERNA SHAKE)  237 mL Oral BID BM  . furosemide  40 mg Intravenous BID  . heparin  5,000 Units Subcutaneous Q8H  . hydrALAZINE  50 mg Oral QID  . insulin aspart  0-9 Units Subcutaneous TID WC  . isosorbide mononitrate  15 mg Oral Daily  . lidocaine  1 patch Transdermal Q24H  . multivitamin with minerals  1 tablet Oral Daily  . polyethylene glycol  17 g Oral Daily  . senna  2 tablet Oral Daily  . simvastatin  20 mg Oral q1800  . sodium chloride  3 mL Intravenous  Q12H  . vancomycin  750 mg Intravenous Q24H  . vitamin C  500 mg Oral Daily   Continuous Infusions:    Charlynne Cousins  [facility] Pager [phone] If 8PM-8AM, please contact night-coverage at [email or web address], password [facility] 12/28/2013, 8:42 AM  LOS: 1 day

## 2013-12-28 NOTE — Progress Notes (Signed)
This encounter was created in error - please disregard.

## 2013-12-28 NOTE — Progress Notes (Signed)
CRITICAL VALUE ALERT  Critical value received:  26.3 Vanc trough level  Date of notification:  12/27/2013  Time of notification:  2240  Critical value read back:  yes  Nurse who received alert:  Renelda Loma RN  MD notified (1st page):  Kathline Magic NP  Time of first page:  2250  Responding MD:  Kathline Magic NP  Time MD responded:  2255  The [facility] NP on-call was also notified of the patient's lethargy and drowisness.  She is easily aroused at this time.  He asked the nurse to notify the pharmacy of the results.  The RN carried out the orders.

## 2013-12-29 DIAGNOSIS — N183 Chronic kidney disease, stage 3 unspecified: Secondary | ICD-10-CM

## 2013-12-29 DIAGNOSIS — I10 Essential (primary) hypertension: Secondary | ICD-10-CM

## 2013-12-29 LAB — GLUCOSE, CAPILLARY
GLUCOSE-CAPILLARY: 172 mg/dL — AB (ref 70–99)
GLUCOSE-CAPILLARY: 188 mg/dL — AB (ref 70–99)
Glucose-Capillary: 132 mg/dL — ABNORMAL HIGH (ref 70–99)
Glucose-Capillary: 166 mg/dL — ABNORMAL HIGH (ref 70–99)

## 2013-12-29 LAB — BASIC METABOLIC PANEL
BUN: 28 mg/dL — ABNORMAL HIGH (ref 6–23)
CO2: 28 mEq/L (ref 19–32)
Calcium: 10.6 mg/dL — ABNORMAL HIGH (ref 8.4–10.5)
Chloride: 101 mEq/L (ref 96–112)
Creatinine, Ser: 1.89 mg/dL — ABNORMAL HIGH (ref 0.50–1.10)
GFR, EST AFRICAN AMERICAN: 28 mL/min — AB (ref >90)
GFR, EST NON AFRICAN AMERICAN: 24 mL/min — AB (ref >90)
Glucose, Bld: 125 mg/dL — ABNORMAL HIGH (ref 70–99)
POTASSIUM: 4.8 meq/L (ref 3.7–5.3)
Sodium: 141 mEq/L (ref 137–147)

## 2013-12-29 LAB — PARATHYROID HORMONE, INTACT (NO CA): PTH: 176.8 pg/mL — AB (ref 14.0–72.0)

## 2013-12-29 LAB — VITAMIN D 25 HYDROXY (VIT D DEFICIENCY, FRACTURES): Vit D, 25-Hydroxy: 26 ng/mL — ABNORMAL LOW (ref 30–89)

## 2013-12-29 MED ORDER — METOLAZONE 2.5 MG PO TABS
2.5000 mg | ORAL_TABLET | Freq: Every day | ORAL | Status: DC
  Administered 2013-12-29 – 2014-01-01 (×4): 2.5 mg via ORAL
  Filled 2013-12-29 (×4): qty 1

## 2013-12-29 NOTE — Care Management (Signed)
Pt refuses cpap RN aware.

## 2013-12-29 NOTE — Progress Notes (Signed)
Pt refused CPAP qHS.  Pt states she slept well with nasal cannula last night and wishes to do the same tonight.  Pt currently on 3LNC with VSS and oxygen saturation of 97%.

## 2013-12-29 NOTE — Progress Notes (Signed)
Report given to receiving RN. Patient is stable with no verbal complaints and no signs or symptoms of distress or discomfort.

## 2013-12-29 NOTE — Progress Notes (Signed)
The patient did much better overnight than the previous night.  She was more alert and oriented and was less restless.  She received one Norco and Tylenol for back pain.  Her daughter is at the bedside.

## 2013-12-29 NOTE — Care Management Note (Addendum)
  Page 1 of 1   12/29/2013     12:09:29 PM   CARE MANAGEMENT NOTE 12/29/2013  Patient:  [name] [name], [name] [name]   Account Number:  [id number]  Date Initiated:  12/29/2013  Documentation initiated by:  Mariann Laster  Subjective/Objective Assessment:   Admitted with CHF     Action/Plan:   Follow for dispositon needs   Anticipated DC Date:  01/01/2014   Anticipated DC Plan:  SKILLED NURSING FACILITY  In-house referral  Clinical Social Worker      DC Planning Services  CM consult      Choice offered to / List presented to:             Status of service:  In process, will continue to follow Medicare Important Message given?   (If response is "NO", the following Medicare IM given date fields will be blank) Date Medicare IM given:   Date Additional Medicare IM given:    Discharge Disposition:    Per UR Regulation:  Reviewed for med. necessity/level of care/duration of stay  If discussed at [place] of Stay Meetings, dates discussed:    Comments:  12/29/2013 Social:  Patient moved from [place] 10/2013 to live with DTR d/t hx/o falls. DTR: Jerni Selmer [phone] (CM attemtped to reach DTR by TC to instruct on establishing PCP; left message with contact requesting call back to CM from DTR/Kathy to CM at [phone]).  CM provided pt with form for: List of  PCPs. PCP:  Patient has not established PCP since moving from [place] and states hx/o mulitple admissions making it diffiicult to get established with PCP. Readmissions:  3 since 11/19/2013 Dispostiion Plan:  SNF/[facility] on IV ABX and Foley ADD: possible d/c today Rubee Vega RN, BSN, MSHL, CCM 12/29/2013

## 2013-12-29 NOTE — Progress Notes (Signed)
[facility] PROGRESS NOTE Interim History: 80 y.o. female with PMH of HTN, HPL, DM, CHF, initially admitted 1/2 for discitis/osteomyelitis on MRI at the T10 and T11 discharged 1/8 then readmitted with HCAP (completed cefepime 7 days on 2/3) presented today with progressive worsening SOB, some PNDs; denies cough, no chest pain, no fever, no nausea, vomiting, no diarrhea; also patient was not taking lasix at SNF;    Assessment/Plan:  Acute diastolic CHF (congestive heart failure), NYHA class 4 - Cont IV lasix and  low dose metolazone. - 107.3 kg ->105 kg, good UOP with metolazone - Srict I and O;s. Daily weights low sodium diet. + JVD, lower extremity edema. - Cont coreg avoid ACE for now. Cont Imdur. - Fluid restrict diet.  Hypertensive heart disease: - Bp improve, cont Coreg, Imdur  Chronic renal disease stage III: - Baseline Cr 1.6-1.9. Monitor daily. - Cont IV lasix.  Hypercalcemia: - Unclear etiology ? If causing renal damage. - ACE level: barely elevated 53., ct scan shows diffuse interstitial pattern with lymphadeopathy doubt infectious in nature she has been on Vanc and rocephin for 4 weeks now. - Pending PTH, 25-OH, 1-25 OH vit D. - WNL TSH.    Code Status: full  Family Communication: d/w patient, her daughter [phone] Disposition Plan: snf when stable     Consultants:  none  Procedures:  Ct CHEST  Antibiotics:  VANC AND ROCEPHIN END DATE 2.18.2015 FOR DISKITIS  (indicate start date, and stop date if known)  HPI/Subjective: No complains, hungry.  When ask about why she is here she act indifferent.  Objective: Filed Vitals:   12/28/13 1354 12/28/13 1807 12/28/13 2056 12/29/13 0613  BP: 131/61 146/60 125/71 132/52  Pulse: 96  86 92  Temp: 98.6 F (37 C)  98.9 F (37.2 C) 98.9 F (37.2 C)  TempSrc: Oral  Oral Oral  Resp: 20  20 20   Height:      Weight:    105.9 kg (233 lb 7.5 oz)  SpO2: 99%  96% 97%    Intake/Output Summary (Last 24  hours) at 12/29/13 1022 Last data filed at 12/29/13 1008  Gross per 24 hour  Intake    600 ml  Output   2050 ml  Net  -1450 ml   Filed Weights   12/27/13 1307 12/28/13 0632 12/29/13 0613  Weight: 107.3 kg (236 lb 8.9 oz) 105 kg (231 lb 7.7 oz) 105.9 kg (233 lb 7.5 oz)    Exam:  General: Alert, awake, oriented x3, in no acute distress.  HEENT: No bruits, no goiter. + JVD Heart: Regular rate and rhythm, without murmurs, rubs, gallops.  Lungs: Good air movement, clear to auscultation. Abdomen: Soft, nontender, nondistended, positive bowel sounds.     Data Reviewed: Basic Metabolic Panel:  Recent Labs Lab 12/27/13 1020 12/28/13 0513 12/29/13 0411  NA 137 137 141  K 5.5* 5.0 4.8  CL 101 102 101  CO2 24 26 28   GLUCOSE 182* 126* 125*  BUN 27* 28* 28*  CREATININE 1.47* 1.70* 1.89*  CALCIUM 10.9* 10.6* 10.6*   Liver Function Tests: No results found for this basename: AST, ALT, ALKPHOS, BILITOT, PROT, ALBUMIN,  in the last 168 hours No results found for this basename: LIPASE, AMYLASE,  in the last 168 hours No results found for this basename: AMMONIA,  in the last 168 hours CBC:  Recent Labs Lab 12/27/13 1020 12/28/13 0513  WBC 8.0 7.0  NEUTROABS 5.8  --   HGB 9.4* 8.2*  HCT  30.1* 25.8*  MCV 84.6 84.9  PLT 231 231   Cardiac Enzymes:  Recent Labs Lab 12/27/13 1020  TROPONINI <0.30   BNP (last 3 results)  Recent Labs  11/25/13 0535 12/16/13 1812 12/27/13 1020  PROBNP 1142.0* 11565.0* 7200.0*   CBG:  Recent Labs Lab 12/28/13 0610 12/28/13 1135 12/28/13 1619 12/28/13 2114 12/29/13 0650  GLUCAP 112* 173* 156* 149* 132*    No results found for this or any previous visit (from the past 240 hour(s)).   Studies: Dg Chest 2 View  12/27/2013   CLINICAL DATA:  Congestive failure  EXAM: CHEST  2 VIEW  COMPARISON:  12/20/2013  FINDINGS: Right-sided PICC line is again identified and stable. Cardiomegaly with diffuse vascular congestion and interstitial  infiltrates is identified. The overall appearance is stable. No new focal abnormality is seen. Degenerative changes of the shoulder joints are noted bilaterally.  IMPRESSION: No change from the prior exam.   Electronically Signed   By: Inez Catalina M.D.   On: 12/27/2013 10:37   Ct Chest Wo Contrast  12/28/2013   CLINICAL DATA:  Persistent infiltrates.  EXAM: CT CHEST WITHOUT CONTRAST  TECHNIQUE: Multidetector CT imaging of the chest was performed following the standard protocol without IV contrast.  COMPARISON:  DG CHEST 2 VIEW dated 12/27/2013; DG CHEST 1V PORT dated 12/20/2013; DG CHEST 1V PORT dated 12/18/2013; DG CHEST 1V PORT dated 12/16/2013; DG THORACIC SPINE W/SWIMMERS dated 11/19/2013  FINDINGS: There are small moderate bilateral pleural effusions which layer dependently. Cardiomegaly is present with low-attenuation the intravascular compartment suggesting anemia. Incidental imaging of the upper abdomen demonstrates atherosclerosis and cholecystectomy and a left interpolar renal cystic lesion, probably a cyst. . Collapse/ consolidation of both lower lobes associated with effusions. Aortic and branch vessel atherosclerosis. Right upper extremity PICC is present with the tip terminating at the cavoatrial junction.  Lungs demonstrate changes of pulmonary fibrosis, which is apical predominant. There is superimposed airspace disease which is present diffusely. This may represent acute phase interstitial pneumonitis although infection can have a similar appearance.  14 mm prevascular lymph node is present, enlarged which may be reactive. Low right peritracheal lymphadenopathy is present, also measuring 14 mm. Suboptimal evaluation of hilar adenopathy without contrast. Central airways appear patent. Severe bilateral glenohumeral osteoarthritis is present.  IMPRESSION: 1. Apical predominant pulmonary fibrosis with superimposed airspace disease. Airspace disease may represent acute phase interstitial pneumonitis or  pneumonia (multifocal pneumonia). Mediastinal adenopathy may be reactive. Adenopathy also raises possibility sarcoidosis accounting for alveolar opacities and adenopathy. 2. Small to moderate bilateral pleural effusions layering dependently. Collapse/consolidation of both lower lobes associated with the effusions. 3. Cardiomegaly and probable anemia.   Electronically Signed   By: Dereck Ligas M.D.   On: 12/28/2013 01:08    Scheduled Meds: . antiseptic oral rinse  15 mL Mouth Rinse BID  . carvedilol  25 mg Oral BID WC  . cefTRIAXone (ROCEPHIN) IVPB 2 gram/50 mL D5W (Pyxis)  2 g Intravenous Q24H  . feeding supplement (ENSURE)  1 Container Oral TID BM  . feeding supplement (RESOURCE BREEZE)  1 Container Oral BID BM  . furosemide  60 mg Intravenous BID  . heparin  5,000 Units Subcutaneous Q8H  . hydrALAZINE  50 mg Oral QID  . insulin aspart  0-9 Units Subcutaneous TID WC  . isosorbide mononitrate  15 mg Oral Daily  . lidocaine  1 patch Transdermal Q24H  . multivitamin with minerals  1 tablet Oral Daily  . polyethylene glycol  17  g Oral Daily  . senna  2 tablet Oral Daily  . simvastatin  20 mg Oral q1800  . sodium chloride  3 mL Intravenous Q12H  . vancomycin  750 mg Intravenous Q24H  . vitamin C  500 mg Oral Daily   Continuous Infusions:    Charlynne Cousins  [facility] Pager [phone] If 8PM-8AM, please contact night-coverage at [email or web address], password [facility] 12/29/2013, 10:22 AM  LOS: 2 days

## 2013-12-29 NOTE — Clinical Social Work Psychosocial (Signed)
     Clinical Social Work Department BRIEF PSYCHOSOCIAL ASSESSMENT 12/29/2013  Patient:  [name] [name], [name] [name]     Account Number:  [id number]     Admit date:  12/27/2013  Clinical Social Worker:  Iona Coach  Date/Time:  12/29/2013 09:04 PM  Referred by:  Physician  Date Referred:  12/28/2013 Referred for  Other - See comment   Other Referral:   Return to [place] type:  Patient Other interview type:    PSYCHOSOCIAL DATA Living Status:  FACILITY Admitted from facility:  [facility] Level of care:  [place] Primary support name:  [name] [name]   [phone] Primary support relationship to patient:  CHILD, ADULT Degree of support available:   Strong support    CURRENT CONCERNS Current Concerns  Other - See comment   Other Concerns:   Return to SNF    SOCIAL WORK ASSESSMENT / PLAN 80 year old female- resident of [facility] Patient is more alert today per nursing and seems to be oriented to person.  She responded "yes" when asked if she wanted to return to [facility] when stable.  CSW will need to talk to her daughter and patient stated this was ok.  Fl2 placed on chart for MD's signature.   Assessment/plan status:  Psychosocial Support/Ongoing Assessment of Needs Other assessment/ plan:   Information/referral to community resources:   None at this time    PATIENTS/FAMILYS RESPONSE TO PLAN OF CARE: Patient was alert and appeared oriented to person during today's visit. This is an improvement since her admission per her nurse. CSW spoke with Crystal- admissions at [facility]- she stated that they would accept patient back when medically stable.  CSW will discuss with patient's daughter [name] [name] but per facility- family as been in contact with them re: bed hold at facility.  CSW wil assist with d/c when medically stable per MD.

## 2013-12-30 DIAGNOSIS — M519 Unspecified thoracic, thoracolumbar and lumbosacral intervertebral disc disorder: Secondary | ICD-10-CM

## 2013-12-30 DIAGNOSIS — R0602 Shortness of breath: Secondary | ICD-10-CM

## 2013-12-30 LAB — BASIC METABOLIC PANEL
BUN: 29 mg/dL — ABNORMAL HIGH (ref 6–23)
CALCIUM: 10.4 mg/dL (ref 8.4–10.5)
CHLORIDE: 100 meq/L (ref 96–112)
CO2: 29 meq/L (ref 19–32)
Creatinine, Ser: 1.91 mg/dL — ABNORMAL HIGH (ref 0.50–1.10)
GFR calc Af Amer: 27 mL/min — ABNORMAL LOW (ref >90)
GFR calc non Af Amer: 24 mL/min — ABNORMAL LOW (ref >90)
GLUCOSE: 134 mg/dL — AB (ref 70–99)
Potassium: 4.5 mEq/L (ref 3.7–5.3)
SODIUM: 140 meq/L (ref 137–147)

## 2013-12-30 LAB — GLUCOSE, CAPILLARY
GLUCOSE-CAPILLARY: 222 mg/dL — AB (ref 70–99)
GLUCOSE-CAPILLARY: 197 mg/dL — AB (ref 70–99)
Glucose-Capillary: 140 mg/dL — ABNORMAL HIGH (ref 70–99)
Glucose-Capillary: 165 mg/dL — ABNORMAL HIGH (ref 70–99)

## 2013-12-30 NOTE — Progress Notes (Addendum)
Patient is lethargic, but is arousable and showing signs of increased work of breathing. She answers questions appropriately, but then tends to go off subject with eyes close and stating/mumbling words over and over. MD made aware. No new orders at this time. Patient remains on continuous pulse ox and O2 saturation is between 97-99%. Will continue to monitor her for further changes.

## 2013-12-30 NOTE — Progress Notes (Signed)
Report given to receiving RN. Patient is sleeping. O2 saturation is 97% on 3.5L. Patient has been sleeping all day. She continues to answer my questions appropriately and is arousable. Patient PO intake has been stable with abnormalities. Made RN aware of her status.

## 2013-12-30 NOTE — Progress Notes (Signed)
Pt. Resting during the night. No s/s of distress noted. Pt. C/o pain to her mid and lower back. PRN pain medication administered and effective. Pt. Asleep upon reassessment. Family at the bedside. Call light within reach. Pts. VSS this am. RN will continue to monitor pt. For changes in condition. [name] [name], Katherine Roan

## 2013-12-30 NOTE — Progress Notes (Signed)
[facility] PROGRESS NOTE Interim History: 80 y.o. female with PMH of HTN, HPL, DM, CHF, initially admitted 1/2 for discitis/osteomyelitis on MRI at the T10 and T11 discharged 1/8 then readmitted with HCAP (completed cefepime 7 days on 2/3) presented today with progressive worsening SOB, some PNDs; denies cough, no chest pain, no fever, no nausea, vomiting, no diarrhea; also patient was not taking lasix at SNF;    Assessment/Plan:  Acute diastolic CHF (congestive heart failure), NYHA class 4 - Cont IV lasix and  low dose metolazone. - good UOP with metolazone - Srict I and O;s. Daily weights low sodium diet. mild JVD, trace lower extremity edema. -breathing better - Cont coreg, avoid ACE for now due renal failure.  -Cont Imdur.  Hypertensive heart disease: - Bp improve, cont Coreg, Imdur  Chronic renal disease stage III: - Baseline Cr 1.6-1.9. Monitor daily. - Cont IV lasix.  Hypercalcemia: - Unclear etiology ? If causing renal damage - ACE level: barely elevated 53., ct scan shows diffuse interstitial pattern with lymphadeopathy doubt infectious in nature she has been on Vanc and rocephin for 4 weeks now. - WNL TSH. -calcium now WNL -continue lasix  T10 diskitis/osteomyelitis -continue IV antibiotics -needs 1 more week  Anemia of chronic disease -no overt bleeding -will monitor Hgb trend  Code Status: full  Family Communication: d/w patient, her daughter [phone] Disposition Plan: snf when stable (1-2 days)     Consultants:  none  Procedures:  Ct CHEST  Antibiotics:  VANC AND ROCEPHIN END DATE 2.18.2015 FOR DISKITIS    HPI/Subjective: No CP; reports breathing is improved. Patient tired and lethargic this morning. Appropriate on exam.  Objective: Filed Vitals:   12/30/13 1240 12/30/13 1500 12/30/13 1705 12/30/13 2042  BP: 129/53 109/40 141/55 120/61  Pulse: 83 89 89 85  Temp:  98.3 F (36.8 C)  98.2 F (36.8 C)  TempSrc:  Oral  Oral   Resp:  20  18  Height:      Weight:      SpO2:  100%  97%    Intake/Output Summary (Last 24 hours) at 12/30/13 2127 Last data filed at 12/30/13 1920  Gross per 24 hour  Intake    240 ml  Output   1500 ml  Net  -1260 ml   Filed Weights   12/28/13 0632 12/29/13 0613 12/30/13 0530  Weight: 105 kg (231 lb 7.7 oz) 105.9 kg (233 lb 7.5 oz) 105.8 kg (233 lb 4 oz)    Exam:  General: Alert, awake, oriented x3, in no acute distress.  HEENT: No bruits, no goiter. mild JVD Heart: Regular rate and rhythm, without murmurs, rubs, gallops. Trace LE edema Lungs: Good air movement, clear to auscultation. Abdomen: Soft, nontender, nondistended, positive bowel sounds.    Data Reviewed: Basic Metabolic Panel:  Recent Labs Lab 12/27/13 1020 12/28/13 0513 12/29/13 0411 12/30/13 0520  NA 137 137 141 140  K 5.5* 5.0 4.8 4.5  CL 101 102 101 100  CO2 24 26 28 29   GLUCOSE 182* 126* 125* 134*  BUN 27* 28* 28* 29*  CREATININE 1.47* 1.70* 1.89* 1.91*  CALCIUM 10.9* 10.6* 10.6* 10.4   CBC:  Recent Labs Lab 12/27/13 1020 12/28/13 0513  WBC 8.0 7.0  NEUTROABS 5.8  --   HGB 9.4* 8.2*  HCT 30.1* 25.8*  MCV 84.6 84.9  PLT 231 231   Cardiac Enzymes:  Recent Labs Lab 12/27/13 1020  TROPONINI <0.30   BNP (last 3 results)  Recent Labs  11/25/13 0535 12/16/13 1812 12/27/13 1020  PROBNP 1142.0* 11565.0* 7200.0*   CBG:  Recent Labs Lab 12/29/13 1641 12/29/13 2107 12/30/13 0539 12/30/13 1130 12/30/13 1701  GLUCAP 166* 188* 140* 222* 197*    Studies: No results found.  Scheduled Meds: . antiseptic oral rinse  15 mL Mouth Rinse BID  . carvedilol  25 mg Oral BID WC  . cefTRIAXone (ROCEPHIN) IVPB 2 gram/50 mL D5W (Pyxis)  2 g Intravenous Q24H  . feeding supplement (ENSURE)  1 Container Oral TID BM  . feeding supplement (RESOURCE BREEZE)  1 Container Oral BID BM  . furosemide  60 mg Intravenous BID  . heparin  5,000 Units Subcutaneous 3 times per day  . hydrALAZINE   50 mg Oral QID  . insulin aspart  0-9 Units Subcutaneous TID WC  . isosorbide mononitrate  15 mg Oral Daily  . lidocaine  1 patch Transdermal Q24H  . metolazone  2.5 mg Oral Daily  . multivitamin with minerals  1 tablet Oral Daily  . polyethylene glycol  17 g Oral Daily  . senna  2 tablet Oral Daily  . simvastatin  20 mg Oral q1800  . sodium chloride  3 mL Intravenous Q12H  . vancomycin  750 mg Intravenous Q24H  . vitamin C  500 mg Oral Daily   Continuous Infusions:   Time > 30 minutes  [name] [name]  [facility] Pager [phone] If 8PM-8AM, please contact night-coverage at [email or web address], password [facility] 12/30/2013, 9:27 PM  LOS: 3 days

## 2013-12-31 DIAGNOSIS — G4733 Obstructive sleep apnea (adult) (pediatric): Secondary | ICD-10-CM

## 2013-12-31 LAB — BASIC METABOLIC PANEL
BUN: 29 mg/dL — ABNORMAL HIGH (ref 6–23)
CALCIUM: 10.7 mg/dL — AB (ref 8.4–10.5)
CHLORIDE: 96 meq/L (ref 96–112)
CO2: 29 mEq/L (ref 19–32)
CREATININE: 1.8 mg/dL — AB (ref 0.50–1.10)
GFR calc non Af Amer: 25 mL/min — ABNORMAL LOW (ref >90)
GFR, EST AFRICAN AMERICAN: 29 mL/min — AB (ref >90)
Glucose, Bld: 142 mg/dL — ABNORMAL HIGH (ref 70–99)
Potassium: 4.2 mEq/L (ref 3.7–5.3)
SODIUM: 139 meq/L (ref 137–147)

## 2013-12-31 LAB — GLUCOSE, CAPILLARY
GLUCOSE-CAPILLARY: 173 mg/dL — AB (ref 70–99)
GLUCOSE-CAPILLARY: 146 mg/dL — AB (ref 70–99)
Glucose-Capillary: 137 mg/dL — ABNORMAL HIGH (ref 70–99)
Glucose-Capillary: 160 mg/dL — ABNORMAL HIGH (ref 70–99)

## 2013-12-31 LAB — VANCOMYCIN, TROUGH: Vancomycin Tr: 26 ug/mL (ref 10.0–20.0)

## 2013-12-31 MED ORDER — VANCOMYCIN HCL 500 MG IV SOLR
500.0000 mg | INTRAVENOUS | Status: DC
  Filled 2013-12-31: qty 500

## 2013-12-31 NOTE — Progress Notes (Signed)
[facility] PROGRESS NOTE Interim History: 80 y.o. female with PMH of HTN, HPL, DM, CHF, initially admitted 1/2 for discitis/osteomyelitis on MRI at the T10 and T11 discharged 1/8 then readmitted with HCAP (completed cefepime 7 days on 2/3) presented today with progressive worsening SOB, some PNDs; denies cough, no chest pain, no fever, no nausea, vomiting, no diarrhea; also patient was not taking lasix at SNF   Assessment/Plan:  Acute diastolic CHF (congestive heart failure), NYHA class 4 - Cont IV lasix and  low dose metolazone X 1 more day. - good UOP with metolazone - continue Srict I and O;s. Daily weights low sodium diet. mild JVD, trace lower extremity edema still appreciated on exam (but better than yesterday). -breathing better - Cont coreg, avoid ACE for now due renal failure.  -Cont Imdur.  Hypertensive heart disease: - Bp improve, cont Coreg, Imdur  Chronic renal disease stage III-IV: - Baseline Cr 1.6-1.9. Monitor daily. -today 2/12 Cr 1.8 - Cont IV lasix X 1 more day  Hypercalcemia: - Unclear etiology ? If causing renal damage - ACE level: barely elevated 53., ct scan shows diffuse interstitial pattern with lymphadeopathy doubt infectious in nature she has been on Vanc and rocephin for 4 weeks now. - WNL TSH. -calcium now WNL -continue lasix  T10 diskitis/osteomyelitis -continue IV antibiotics -needs 6 more days (until 2/18)  Anemia of chronic disease -no overt bleeding -will monitor Hgb trend  OSA -continue CPAP  Code Status: full  Family Communication: d/w patient, her daughter [phone] Disposition Plan: snf when stable (1-2 days)     Consultants:  none  Procedures:  Ct CHEST  Antibiotics:  VANC AND ROCEPHIN END DATE 2.18.2015 FOR DISKITIS    HPI/Subjective: No CP; reports breathing continue to improve. Patient has remained sleepy but easily aroused  Objective: Filed Vitals:   12/31/13 1053 12/31/13 1400 12/31/13 1804  12/31/13 2111  BP: 136/56 118/47 145/66 134/46  Pulse: 80 82 85 80  Temp:  98.4 F (36.9 C)  98.1 F (36.7 C)  TempSrc:  Oral    Resp:  22  20  Height:      Weight:      SpO2:  100%  98%    Intake/Output Summary (Last 24 hours) at 12/31/13 2306 Last data filed at 12/31/13 2154  Gross per 24 hour  Intake    300 ml  Output   2125 ml  Net  -1825 ml   Filed Weights   12/29/13 0613 12/30/13 0530 12/31/13 0458  Weight: 105.9 kg (233 lb 7.5 oz) 105.8 kg (233 lb 4 oz) 105.1 kg (231 lb 11.3 oz)    Exam:  General: Alert, awake, oriented x3, in no acute distress.  HEENT: No bruits, no goiter. mild JVD Heart: Regular rate and rhythm, without murmurs, rubs, gallops. Trace LE edema Lungs: Good air movement, clear to auscultation. Abdomen: Soft, nontender, nondistended, positive bowel sounds.    Data Reviewed: Basic Metabolic Panel:  Recent Labs Lab 12/27/13 1020 12/28/13 0513 12/29/13 0411 12/30/13 0520 12/31/13 0500  NA 137 137 141 140 139  K 5.5* 5.0 4.8 4.5 4.2  CL 101 102 101 100 96  CO2 24 26 28 29 29   GLUCOSE 182* 126* 125* 134* 142*  BUN 27* 28* 28* 29* 29*  CREATININE 1.47* 1.70* 1.89* 1.91* 1.80*  CALCIUM 10.9* 10.6* 10.6* 10.4 10.7*   CBC:  Recent Labs Lab 12/27/13 1020 12/28/13 0513  WBC 8.0 7.0  NEUTROABS 5.8  --   HGB 9.4* 8.2*  HCT 30.1* 25.8*  MCV 84.6 84.9  PLT 231 231   Cardiac Enzymes:  Recent Labs Lab 12/27/13 1020  TROPONINI <0.30   BNP (last 3 results)  Recent Labs  11/25/13 0535 12/16/13 1812 12/27/13 1020  PROBNP 1142.0* 11565.0* 7200.0*   CBG:  Recent Labs Lab 12/30/13 2039 12/31/13 0628 12/31/13 1126 12/31/13 1540 12/31/13 2117  GLUCAP 165* 137* 173* 160* 146*    Studies: No results found.  Scheduled Meds: . antiseptic oral rinse  15 mL Mouth Rinse BID  . carvedilol  25 mg Oral BID WC  . cefTRIAXone (ROCEPHIN) IVPB 2 gram/50 mL D5W (Pyxis)  2 g Intravenous Q24H  . feeding supplement (ENSURE)  1 Container  Oral TID BM  . feeding supplement (RESOURCE BREEZE)  1 Container Oral BID BM  . furosemide  60 mg Intravenous BID  . heparin  5,000 Units Subcutaneous 3 times per day  . hydrALAZINE  50 mg Oral QID  . insulin aspart  0-9 Units Subcutaneous TID WC  . isosorbide mononitrate  15 mg Oral Daily  . lidocaine  1 patch Transdermal Q24H  . metolazone  2.5 mg Oral Daily  . multivitamin with minerals  1 tablet Oral Daily  . polyethylene glycol  17 g Oral Daily  . senna  2 tablet Oral Daily  . simvastatin  20 mg Oral q1800  . sodium chloride  3 mL Intravenous Q12H  . [START ON 01/01/2014] vancomycin  500 mg Intravenous Q24H  . vitamin C  500 mg Oral Daily   Continuous Infusions:   Time > 30 minutes  Eleftherios Dudenhoeffer  [facility] Pager [phone] If 8PM-8AM, please contact night-coverage at [email or web address], password [facility] 12/31/2013, 11:06 PM  LOS: 4 days

## 2013-12-31 NOTE — Progress Notes (Signed)
Patient in bed resting. No verbal complaints. Report given to receiving RN.

## 2013-12-31 NOTE — Progress Notes (Signed)
Pt. Alert and oriented this am. No s/s of distress noted. Pt. Continues to c/o back pain which is chronic. PRN pain medication administered and effective during the night. Pr. On continuous pulse ox monitor with O2 via nasal cannula at 3L. O2 sats at 93-96%. Pt. Doesn't however pull cannula off often and sats drop down to 83%. Pt. Reminded by RN to keep nasal cannula in place. Pt. Currently resting in bed. Report given to oncoming RN. Ciani Rutten, Katherine Roan

## 2013-12-31 NOTE — Progress Notes (Addendum)
CRITICAL VALUE ALERT  Critical value received:  Vancomycin Trough 26.0  Date of notification:  12/31/13  Time of notification:  6720  Critical value read back:yes  Nurse who received alert:  Lenis Dickinson RN to Ollen Barges RN  MD notified (1st page):  [place]  Time of first page:  1440  MD notified (2nd page):  Time of second page:  Responding MD:  Dyann Kief  Time MD responded: 1440

## 2013-12-31 NOTE — Progress Notes (Signed)
Pt. Refuses CPAP at this time. Pt. Was made aware to let RT/RN know anytime during the night if she changes her mind & decides to wear CPAP.

## 2013-12-31 NOTE — Progress Notes (Signed)
ANTIBIOTIC CONSULT NOTE - FOLLOW UP  Pharmacy Consult for vancomycin Indication: osteomyelitis of thoracic spine  Allergies  Allergen Reactions  . Motrin [Ibuprofen] Hives    Patient Measurements: Height: 5\' 1"  (154.9 cm) Weight: 231 lb 11.3 oz (105.1 kg) (bedscale) IBW/kg (Calculated) : 47.8  Vital Signs: Temp: 98.4 F (36.9 C) (02/12 1400) Temp src: Oral (02/12 1400) BP: 118/47 mmHg (02/12 1400) Pulse Rate: 82 (02/12 1400) Intake/Output from previous day: 02/11 0701 - 02/12 0700 In: 240 [P.O.:240] Out: 2025 [Urine:2025] Intake/Output from this shift: Total I/O In: 240 [P.O.:240] Out: 450 [Urine:450]  Labs:  Recent Labs  12/29/13 0411 12/30/13 0520 12/31/13 0500  CREATININE 1.89* 1.91* 1.80*   Estimated Creatinine Clearance: 27.4 ml/min (by C-G formula based on Cr of 1.8).  Recent Labs  12/31/13 0940  VANCOTROUGH 26.0*     Microbiology: Recent Results (from the past 720 hour(s))  URINE CULTURE     Status: None   Collection Time    12/16/13  5:47 PM      Result Value Ref Range Status   Specimen Description URINE, CATHETERIZED   Final   Special Requests NONE   Final   Culture  Setup Time     Final   Value: 12/16/2013 19:12     Performed at [facility]     Final   Value: NO GROWTH     Performed at [facility]   Culture     Final   Value: NO GROWTH     Performed at [facility]   Report Status 12/17/2013 FINAL   Final  CULTURE, BLOOD (ROUTINE X 2)     Status: None   Collection Time    12/16/13 11:14 PM      Result Value Ref Range Status   Specimen Description BLOOD PICC LINE   Final   Special Requests BOTTLES DRAWN AEROBIC ONLY 10CC   Final   Culture  Setup Time     Final   Value: 12/17/2013 03:34     Performed at [facility]   Culture     Final   Value: NO GROWTH 5 DAYS     Performed at [facility]   Report Status 12/23/2013 FINAL   Final  MRSA PCR SCREENING     Status: None   Collection Time    12/17/13 12:27 AM      Result Value Ref Range Status   MRSA by PCR NEGATIVE  NEGATIVE Final   Comment:            The GeneXpert MRSA Assay (FDA     approved for NASAL specimens     only), is one component of a     comprehensive MRSA colonization     surveillance program. It is not     intended to diagnose MRSA     infection nor to guide or     monitor treatment for     MRSA infections.    Anti-infectives   Start     Dose/Rate Route Frequency Ordered Stop   12/28/13 1000  vancomycin (VANCOCIN) IVPB 750 mg/150 ml premix     750 mg 150 mL/hr over 60 Minutes Intravenous Every 24 hours 12/27/13 2259     12/27/13 1415  cefTRIAXone (ROCEPHIN) 2 g in dextrose 5 % 50 mL IVPB     2 g 100 mL/hr over 30 Minutes Intravenous Every 24 hours 12/27/13 1330  Assessment: 80 y/o female who continues on vancomycin for thoracic diskitis/osteomyelitis. Patient has been on vancomycin since early January with varying doses based on renal function. Plan is for antibiotics another week or so.   Vancomycin trough today is 26 and supratherapeutic despite dose decrease on 2/8. SCr is elevated at 1.8 and above baseline of 1.3-1.4. UOP was good from yesterday at 0.8 ml/kg/hr. Vancomycin trough level result delayed because the lab machines were down and the sample was processed at [facility] - in the mean time, patient received her dose this morning.  Vanc 1/3>>  Came in on 1000 mg q24h from SNF 2/8 VT 26.3, Change vanc 750 IV q24h  Goal of Therapy:  Vancomycin trough level 15-20 mcg/ml  Plan:  -Decrease vancomycin to 500 mg IV q24h - begin 2/13 20:00 -Check trough at steady state -Monitor renal function  [facility], Owen.D., BCPS Clinical Pharmacist Pager: [phone] 12/31/2013 2:56 PM

## 2013-12-31 NOTE — Evaluation (Signed)
Physical Therapy Evaluation Patient Details Name: [name] [name] MRN: [id number] DOB: 1929/12/17 Today's Date: 12/31/2013 Time: 5093-2671 PT Time Calculation (min): 17 min  PT Assessment / Plan / Recommendation History of Present Illness   [name] [name] is a 80 y.o. female with PMH of HTN, HPL, DM, CHF, initially admitted 1/2 for discitis/osteomyelitis on MRI at the T10 and T11 discharged 1/8 then readmitted with HCAP (completed cefepime 7 days on 2/3) presented today with progressive worsening SOB, some PNDs; denies cough, no chest pain, no fever, no nausea, vomiting, no diarrhea; also patient was not taking lasix at SNF;    Clinical Impression  Pt adm due to the above. Adm from [facility] for post acute rehab. Pt presents with decreased independence with mobility and overall deconditioning. Pt to benefit from skilled acute PT to increase mobility prior to D/C to [facility] for further post acute rehab.   PT Assessment  Patient needs continued PT services    Follow Up Recommendations  SNF    Does the patient have the potential to tolerate intense rehabilitation      Barriers to Discharge   returning to [place] Recommendations  None recommended by PT    Recommendations for Other Services     Frequency Min 2X/week    Precautions / Restrictions Precautions Precautions: Fall Restrictions Weight Bearing Restrictions: No   Pertinent Vitals/Pain 98% on 3L O2       Mobility  Bed Mobility Overal bed mobility: +2 for physical assistance;Needs Assistance Bed Mobility: Supine to Sit;Sit to Supine Supine to sit: +2 for physical assistance;Mod assist;HOB elevated Sit to supine: Mod assist;+2 for physical assistance General bed mobility comments: (A) to bring Les off EOB while elevating trunk; very effortful for pt; max cues for sequencing; (A) to position hips and lift LEs onto bed after activity and with incr fatigue Transfers Overall transfer level: Needs  assistance Equipment used: 2 person hand held assist Transfers: Sit to/from Stand Sit to Stand: +2 physical assistance;Total assist General transfer comment: (A) with use of gt belt and draw pad to elevate hips into standing position; pt with fwd flexed posture and increased shakiness in LEs due to decreased strength; tolerated standing <30 seconds         PT Diagnosis: Difficulty walking;Generalized weakness  PT Problem List: Decreased strength;Decreased mobility;Decreased activity tolerance;Decreased balance;Pain;Decreased knowledge of use of DME PT Treatment Interventions: DME instruction;Therapeutic exercise;Gait training;Balance training;Functional mobility training;Therapeutic activities;Patient/family education     PT Goals(Current goals can be found in the care plan section) Acute Rehab PT Goals Patient Stated Goal: Return home eventually PT Goal Formulation: With patient Time For Goal Achievement: 01/14/14 Potential to Achieve Goals: Fair  Visit Information  Last PT Received On: 12/31/13 Assistance Needed: +2 History of Present Illness:  [name] [name] is a 80 y.o. female with PMH of HTN, HPL, DM, CHF, initially admitted 1/2 for discitis/osteomyelitis on MRI at the T10 and T11 discharged 1/8 then readmitted with HCAP (completed cefepime 7 days on 2/3) presented today with progressive worsening SOB, some PNDs; denies cough, no chest pain, no fever, no nausea, vomiting, no diarrhea; also patient was not taking lasix at SNF;         Prior [facility] expects to be discharged to:: Skilled nursing facility Additional Comments: Adm from [facility] and plans to return Prior Function Level of Independence: Needs assistance Gait / Transfers Assistance Needed: reports she does not walk  ADL's /  Homemaking Assistance Needed: reports the people at [facility] provide total (A)  Comments: history of falls Communication Communication: No difficulties     Cognition  Cognition Arousal/Alertness: Awake/alert Behavior During Therapy: Restless Overall Cognitive Status: Impaired/Different from baseline Area of Impairment: Orientation;Following commands;Problem solving Orientation Level: Disoriented to;Situation;Place Memory: Decreased short-term memory Following Commands: Follows one step commands with increased time Problem Solving: Slow processing;Difficulty sequencing;Requires verbal cues;Requires tactile cues General Comments: pt stood with (A) and later reported she did not stand; intermittent confusion and restless thought session    Extremity/Trunk Assessment Upper Extremity Assessment Upper Extremity Assessment: Defer to OT evaluation Lower Extremity Assessment Lower Extremity Assessment: Generalized weakness Cervical / Trunk Assessment Cervical / Trunk Assessment: Kyphotic   Balance Balance Overall balance assessment: Needs assistance;History of Falls Sitting-balance support: Feet supported;Single extremity supported Sitting balance-Leahy Scale: Fair Sitting balance - Comments: sat EOB ~5 min to prepare to stand  Postural control: Other (comment) (anterior lean) Standing balance support: During functional activity;Bilateral upper extremity supported Standing balance-Leahy Scale: Zero Standing balance comment: requires 2 person max (A) to maintain balance  General Comments General comments (skin integrity, edema, etc.): upon entering room pt had removed nasal cannula and was at 83%; desat; RN notified   End of Session PT - End of Session Equipment Utilized During Treatment: Gait belt;Oxygen Activity Tolerance: Patient limited by fatigue Patient left: in bed;with bed alarm set;with call bell/phone within reach Nurse Communication: Mobility status;Other (comment) (O2 sats )  GP     Gustavus Bryant, [place] [phone] 12/31/2013, 5:39 PM

## 2013-12-31 NOTE — Progress Notes (Cosign Needed)
CSW called [facility] to inform facility that pt will be tentatively d/c tomorrow. CSW spoke with Florentina Jenny in Admissions [phone]) and she stated that they are still working on [employer]. Anticipated d/c tomorrow if stable per MD.  Donnella Sham, [place] Intern  [phone]

## 2014-01-01 DIAGNOSIS — E785 Hyperlipidemia, unspecified: Secondary | ICD-10-CM

## 2014-01-01 DIAGNOSIS — M869 Osteomyelitis, unspecified: Secondary | ICD-10-CM

## 2014-01-01 LAB — BLOOD GAS, ARTERIAL
Acid-Base Excess: 8.1 mmol/L — ABNORMAL HIGH (ref 0.0–2.0)
Bicarbonate: 33.3 mEq/L — ABNORMAL HIGH (ref 20.0–24.0)
Drawn by: 222511
FIO2: 0.3 %
O2 Content: 6 L/min
O2 Saturation: 82.5 %
Patient temperature: 98.6
TCO2: 35 mmol/L (ref 0–100)
pCO2 arterial: 57.5 mmHg (ref 35.0–45.0)
pH, Arterial: 7.38 (ref 7.350–7.450)
pO2, Arterial: 47.1 mmHg — ABNORMAL LOW (ref 80.0–100.0)

## 2014-01-01 LAB — VITAMIN D 1,25 DIHYDROXY
VITAMIN D 1, 25 (OH) TOTAL: 16 pg/mL — AB (ref 18–72)
Vitamin D2 1, 25 (OH)2: <8 pg/mL
Vitamin D3 1, 25 (OH)2: 16 pg/mL

## 2014-01-01 LAB — BASIC METABOLIC PANEL
BUN: 28 mg/dL — ABNORMAL HIGH (ref 6–23)
CO2: 33 mEq/L — ABNORMAL HIGH (ref 19–32)
Calcium: 10.7 mg/dL — ABNORMAL HIGH (ref 8.4–10.5)
Chloride: 97 mEq/L (ref 96–112)
Creatinine, Ser: 1.93 mg/dL — ABNORMAL HIGH (ref 0.50–1.10)
GFR calc Af Amer: 27 mL/min — ABNORMAL LOW (ref >90)
GFR calc non Af Amer: 23 mL/min — ABNORMAL LOW (ref >90)
Glucose, Bld: 158 mg/dL — ABNORMAL HIGH (ref 70–99)
Potassium: 4.3 mEq/L (ref 3.7–5.3)
Sodium: 139 mEq/L (ref 137–147)

## 2014-01-01 LAB — GLUCOSE, CAPILLARY
Glucose-Capillary: 156 mg/dL — ABNORMAL HIGH (ref 70–99)
Glucose-Capillary: 169 mg/dL — ABNORMAL HIGH (ref 70–99)
Glucose-Capillary: 145 mg/dL — ABNORMAL HIGH (ref 70–99)

## 2014-01-01 MED ORDER — VANCOMYCIN HCL 500 MG IV SOLR: 500.0000 mg | INTRAVENOUS | Status: DC

## 2014-01-01 MED ORDER — HEPARIN SOD (PORK) LOCK FLUSH 100 UNIT/ML IV SOLN
250.0000 [IU] | Freq: Every day | INTRAVENOUS | Status: DC
  Filled 2014-01-01: qty 3

## 2014-01-01 MED ORDER — HALOPERIDOL LACTATE 5 MG/ML IJ SOLN
0.5000 mg | Freq: Once | INTRAMUSCULAR | Status: AC
  Administered 2014-01-01: 0.5 mg via INTRAVENOUS

## 2014-01-01 MED ORDER — HYDROCODONE-ACETAMINOPHEN 10-325 MG PO TABS: 1.0000 | ORAL_TABLET | Freq: Four times a day (QID) | ORAL | Status: AC | PRN

## 2014-01-01 MED ORDER — ENSURE PUDDING PO PUDG: 1.0000 | Freq: Three times a day (TID) | ORAL | Status: AC

## 2014-01-01 MED ORDER — FUROSEMIDE 20 MG PO TABS: 60.0000 mg | ORAL_TABLET | Freq: Two times a day (BID) | ORAL | Status: DC

## 2014-01-01 MED ORDER — DEXTROSE 5 % IV SOLN: 2.0000 g | INTRAVENOUS | Status: DC

## 2014-01-01 MED ORDER — HEPARIN SOD (PORK) LOCK FLUSH 100 UNIT/ML IV SOLN
250.0000 [IU] | INTRAVENOUS | Status: DC | PRN
  Administered 2014-01-01 (×2): 250 [IU]
  Filled 2014-01-01: qty 3

## 2014-01-01 MED ORDER — QUETIAPINE FUMARATE 50 MG PO TABS
50.0000 mg | ORAL_TABLET | Freq: Every day | ORAL | Status: DC
  Filled 2014-01-01: qty 1

## 2014-01-01 MED ORDER — FUROSEMIDE 10 MG/ML IJ SOLN
INTRAMUSCULAR | Status: AC
  Filled 2014-01-01: qty 8

## 2014-01-01 MED ORDER — HALOPERIDOL LACTATE 5 MG/ML IJ SOLN
INTRAMUSCULAR | Status: AC
  Filled 2014-01-01: qty 1

## 2014-01-01 MED ORDER — QUETIAPINE FUMARATE 50 MG PO TABS: 50.0000 mg | ORAL_TABLET | Freq: Every day | ORAL | Status: DC

## 2014-01-01 MED ORDER — ISOSORBIDE MONONITRATE 15 MG HALF TABLET: 15.0000 mg | ORAL_TABLET | Freq: Every day | ORAL | Status: AC

## 2014-01-01 NOTE — Progress Notes (Signed)
Ok per MD for d/c today back to [facility] for continued IV antibiotics and care. CSW spoke to [place]- Admissions at [facility] given for patient to return to facility.  Ok per patient's daughter Juliann Pulse.  Nursing will be notified to call report. EMS to transport and packet completed for d/c back to facility. No further CSW needs identified.  CSW signing off. Lorie Phenix. [place], [place]

## 2014-01-01 NOTE — Discharge Summary (Signed)
Physician Discharge Summary  [name] [name] [id number] DOB: [birth year] DOA: 12/27/2013  PCP: No PCP Per Patient  Admit date: 12/27/2013 Discharge date: 01/01/2014  Time spent: >30 minutes  Recommendations for Outpatient Follow-up:  1. Patient needs to wear CPAP every night 2. Reassess needs of seroquel in 5 days (started on it for hospital acquired delirium) 3. Slowly wean oxygen 4. Follow with PCP in 1 week 5. BMET in 5 days to follow renal function and electrolytes 6. Physical therapy as per facility protocol  Discharge Diagnoses:  Acute diastolic CHF (congestive heart failure), NYHA class 4 Hypertensive heart disease Hypercalcemia Chronic renal disease, stage 3-4, moderately decreased glomerular filtration rate between 30-59 mL/min/1.73 square meter Thoracic osteomyelitis Acute delirium OSA Anemia of chronic disease   Discharge Condition: stable and improved. No significant SOB. Patient discharge back to SNF for physical rehab.   Diet recommendation: low sodium diet  Filed Weights   12/30/13 0530 12/31/13 0458 01/01/14 0528  Weight: 105.8 kg (233 lb 4 oz) 105.1 kg (231 lb 11.3 oz) 102.9 kg (226 lb 13.7 oz)    History of present illness:  80 y.o. female with PMH of HTN, HPL, DM, CHF, initially admitted 1/2 for discitis/osteomyelitis on MRI at the T10 and T11 discharged 1/8 then readmitted with HCAP (completed cefepime 7 days on 2/3) presented today with progressive worsening SOB, some PNDs; denies cough, no chest pain, no fever, no nausea, vomiting, no diarrhea; also patient was not taking lasix at SNF;  -patient not ambulatory for few month due to weakness, DJD; per her    Hospital Course:  Assessment/Plan:  Acute diastolic CHF (congestive heart failure), NYHA class 4  - good diuresis; no Significant SOB now; no JVD or crackles on exam - will discharge with instructions for low sodium diet, daily weight and lasix PO BID - Cont coreg, avoid ACE for now due renal failure.   -Cont Imdur.   Hypertensive heart disease:  - Bp improve, cont Coreg, Imdur   Chronic renal disease stage III-IV:  - Baseline Cr 1.6-1.9. Monitor daily.  -today 2/12 Cr 1.8  - Contnow lasix BID 60mg  PO; close follow up of renal function as an outpatient  Hypercalcemia:  - Unclear etiology ? If causing renal damage  - ACE level: barely elevated 53., ct scan shows diffuse interstitial pattern with lymphadeopathy doubt infectious in nature she has been on Vanc and rocephin for 4 weeks now.  - WNL TSH.  -calcium now stable at borderline high normal  -continue lasix   T10 diskitis/osteomyelitis  -continue IV antibiotics  -needs 5 more days (until 2/18)   Anemia of chronic disease  -no overt bleeding  -monitor Hgb trend   OSA  -continue CPAP QHS  Acute delirium and sundowning  -started on seroquel -patient also needs to wear he CPAP everynight   Procedures: Echo (12/17/13 The cavity size was normal. Wall thickness was increased in a pattern of moderate LVH. Systolic function was normal. The estimated ejection fraction was in the range of 60% to 65%. Wall motion was normal; there were no regional wall motion abnormalities. Doppler parameters are consistent with abnormal left ventricular relaxation (grade 1 diastolic dysfunction). - Left atrium: The atrium was mildly dilated. - Pulmonary arteries: Systolic pressure was mildly increased. PA peak pressure: 79mm Hg (S).  Consultations:  none  Discharge Exam: Filed Vitals:   01/01/14 1155  BP:   Pulse: 92  Temp:   Resp: 26   General: Alert, awake, oriented x3, in no  acute distress.  HEENT: No bruits, no goiter. No JVD  Heart: Regular rate and rhythm, without murmurs, rubs, gallops. Trace LE edema  Lungs: Good air movement, clear to auscultation.  Abdomen: Soft, nontender, nondistended, positive bowel sounds.     Discharge Instructions  Discharge Orders   Future Appointments Provider Department Dept Phone    01/06/2014 2:00 PM Truman Hayward, MD [facility] for Infectious Disease [phone]   Future Orders Complete By Expires   Diet - low sodium heart healthy  As directed    Discharge instructions  As directed    Comments:     Patient needs to wear CPAP every night Reassess needs of seroquel in 5 days (started on it for hospital acquired delirium) Slowly wean oxygen Follow with PCP in 1 week BMET in 5 days to follow renal function and electrolytes Physical therapy as per facility protocol       Medication List    STOP taking these medications       sodium chloride 0.9 % SOLN 250 mL with vancomycin 1000 MG SOLR 1,000 mg  Replaced by:  sodium chloride 0.9 % SOLN 100 mL with vancomycin 500 MG SOLR 500 mg     traMADol 50 MG tablet  Commonly known as:  ULTRAM      TAKE these medications       acetaminophen 325 MG tablet  Commonly known as:  TYLENOL  Take 650 mg by mouth every 4 (four) hours as needed for fever. *takes if temperature over 99.5*     carvedilol 25 MG tablet  Commonly known as:  COREG  Take 1 tablet (25 mg total) by mouth 2 (two) times daily with a meal.     dextrose 5 % SOLN 50 mL with cefTRIAXone 2 G SOLR 2 g  Inject 2 g into the vein daily. X 6 WEEKS, till 01/06/14     feeding supplement (ENSURE) Pudg  Take 1 Container by mouth 3 (three) times daily between meals.     furosemide 20 MG tablet  Commonly known as:  LASIX  Take 3 tablets (60 mg total) by mouth 2 (two) times daily.     hydrALAZINE 50 MG tablet  Commonly known as:  APRESOLINE  Take 50 mg by mouth 4 (four) times daily.     HYDROcodone-acetaminophen 10-325 MG per tablet  Commonly known as:  NORCO  Take 1 tablet by mouth every 6 (six) hours as needed for moderate pain.     ipratropium-albuterol 0.5-2.5 (3) MG/3ML Soln  Commonly known as:  DUONEB  Take 3 mLs by nebulization every 2 (two) hours as needed (shortness of breath).     isosorbide mononitrate 15 mg Tb24 24 hr tablet   Commonly known as:  IMDUR  Take 0.5 tablets (15 mg total) by mouth daily.     lidocaine 5 %  Commonly known as:  LIDODERM  Place 1 patch onto the skin daily. Remove & Discard patch within 12 hours or as directed by MD     multivitamin with minerals tablet  Take 1 tablet by mouth daily.     polyethylene glycol packet  Commonly known as:  MIRALAX / GLYCOLAX  Take 17 g by mouth daily.     QUEtiapine 50 MG tablet  Commonly known as:  SEROQUEL  Take 1 tablet (50 mg total) by mouth at bedtime.     senna 8.6 MG Tabs tablet  Commonly known as:  SENOKOT  Take 2 tablets by  mouth daily.     simvastatin 20 MG tablet  Commonly known as:  ZOCOR  Take 20 mg by mouth daily at 6 PM.     sodium chloride 0.9 % injection  10-40 mLs by Intracatheter route as needed (flush).     sodium chloride 0.9 % SOLN 100 mL with vancomycin 500 MG SOLR 500 mg  Inject 500 mg into the vein daily. Until 01/06/14     vitamin C 500 MG tablet  Commonly known as:  ASCORBIC ACID  Take 500 mg by mouth daily.       Allergies  Allergen Reactions  . Motrin [Ibuprofen] Hives    The results of significant diagnostics from this hospitalization (including imaging, microbiology, ancillary and laboratory) are listed below for reference.    Significant Diagnostic Studies: Dg Chest 2 View  12/27/2013   CLINICAL DATA:  Congestive failure  EXAM: CHEST  2 VIEW  COMPARISON:  12/20/2013  FINDINGS: Right-sided PICC line is again identified and stable. Cardiomegaly with diffuse vascular congestion and interstitial infiltrates is identified. The overall appearance is stable. No new focal abnormality is seen. Degenerative changes of the shoulder joints are noted bilaterally.  IMPRESSION: No change from the prior exam.   Electronically Signed   By: Inez Catalina M.D.   On: 12/27/2013 10:37   Ct Chest Wo Contrast  12/28/2013   CLINICAL DATA:  Persistent infiltrates.  EXAM: CT CHEST WITHOUT CONTRAST  TECHNIQUE: Multidetector CT  imaging of the chest was performed following the standard protocol without IV contrast.  COMPARISON:  DG CHEST 2 VIEW dated 12/27/2013; DG CHEST 1V PORT dated 12/20/2013; DG CHEST 1V PORT dated 12/18/2013; DG CHEST 1V PORT dated 12/16/2013; DG THORACIC SPINE W/SWIMMERS dated 11/19/2013  FINDINGS: There are small moderate bilateral pleural effusions which layer dependently. Cardiomegaly is present with low-attenuation the intravascular compartment suggesting anemia. Incidental imaging of the upper abdomen demonstrates atherosclerosis and cholecystectomy and a left interpolar renal cystic lesion, probably a cyst. . Collapse/ consolidation of both lower lobes associated with effusions. Aortic and branch vessel atherosclerosis. Right upper extremity PICC is present with the tip terminating at the cavoatrial junction.  Lungs demonstrate changes of pulmonary fibrosis, which is apical predominant. There is superimposed airspace disease which is present diffusely. This may represent acute phase interstitial pneumonitis although infection can have a similar appearance.  14 mm prevascular lymph node is present, enlarged which may be reactive. Low right peritracheal lymphadenopathy is present, also measuring 14 mm. Suboptimal evaluation of hilar adenopathy without contrast. Central airways appear patent. Severe bilateral glenohumeral osteoarthritis is present.  IMPRESSION: 1. Apical predominant pulmonary fibrosis with superimposed airspace disease. Airspace disease may represent acute phase interstitial pneumonitis or pneumonia (multifocal pneumonia). Mediastinal adenopathy may be reactive. Adenopathy also raises possibility sarcoidosis accounting for alveolar opacities and adenopathy. 2. Small to moderate bilateral pleural effusions layering dependently. Collapse/consolidation of both lower lobes associated with the effusions. 3. Cardiomegaly and probable anemia.   Electronically Signed   By: Dereck Ligas M.D.   On: 12/28/2013  01:08   Dg Chest Port 1 View  12/20/2013   CLINICAL DATA:  Hypoxia with chest congestion and cough.  EXAM: PORTABLE CHEST - 1 VIEW  COMPARISON:  12/18/2013  FINDINGS: Bilateral irregular interstitial airspace opacities are stable, noted throughout the right lung and in the left lung base.  Cardiac silhouette is mildly enlarged.  There is a new right PICC with its tip near the caval atrial junction.  No pneumothorax.  IMPRESSION: 1. Right  greater than left patchy alveolar interstitial densities, unchanged from the prior study. 2. New right PICC, well positioned.  No other change.   Electronically Signed   By: Lajean Manes M.D.   On: 12/20/2013 13:57   Dg Chest Port 1 View  12/18/2013   CLINICAL DATA:  Hypoxia and infiltrates  EXAM: PORTABLE CHEST - 1 VIEW  COMPARISON:  Portable chest x-ray of December 16, 2013.  FINDINGS: The lungs are adequately inflated. The pulmonary interstitial markings remain increased. The lung markings become confluent in the lower lobes. The cardiac silhouette remains enlarged. The pulmonary vascularity is indistinct. There is no definite pleural effusion. A right-sided PICC line is in place with the tip lying in the midportion of the SVC. There are degenerative changes of both shoulders.  IMPRESSION: 1. The lungs are better inflated today. There remain bilateral interstitial and alveolar infiltrates. This coupled with the prominence of the cardiac silhouette and pulmonary vascularity suggests congestive heart failure. Certainly pneumonia cannot be excluded in the appropriate clinical setting. 2. A followup PA and lateral chest x-ray would be useful when the patient can tolerate the procedure.   Electronically Signed   By: David  Martinique   On: 12/18/2013 14:37   Dg Chest Port 1 View  12/16/2013   CLINICAL DATA:  Shortness of breath.  EXAM: PORTABLE CHEST - 1 VIEW  COMPARISON:  11/25/2013  FINDINGS: PICC tip is at the cavoatrial junction. There is diffuse increased interstitial  accentuation bilaterally which may represent pulmonary edema or pneumonitis. Increased density at the right lung base may represent a small effusion.  Severe arthritic changes seen at both shoulders.  Pulmonary vascularity is within normal limits. Heart size is at the upper limits of normal.  IMPRESSION: Progressive accentuation of interstitial markings bilaterally which may represent pulmonary edema or pneumonitis.   Electronically Signed   By: Rozetta Nunnery M.D.   On: 12/16/2013 17:28   Labs: Basic Metabolic Panel:  Recent Labs Lab 12/28/13 0513 12/29/13 0411 12/30/13 0520 12/31/13 0500 01/01/14 0551  NA 137 141 140 139 139  K 5.0 4.8 4.5 4.2 4.3  CL 102 101 100 96 97  CO2 26 28 29 29  33*  GLUCOSE 126* 125* 134* 142* 158*  BUN 28* 28* 29* 29* 28*  CREATININE 1.70* 1.89* 1.91* 1.80* 1.93*  CALCIUM 10.6* 10.6* 10.4 10.7* 10.7*   CBC:  Recent Labs Lab 12/27/13 1020 12/28/13 0513  WBC 8.0 7.0  NEUTROABS 5.8  --   HGB 9.4* 8.2*  HCT 30.1* 25.8*  MCV 84.6 84.9  PLT 231 231   Cardiac Enzymes:  Recent Labs Lab 12/27/13 1020  TROPONINI <0.30   BNP: BNP (last 3 results)  Recent Labs  11/25/13 0535 12/16/13 1812 12/27/13 1020  PROBNP 1142.0* 11565.0* 7200.0*   CBG:  Recent Labs Lab 12/31/13 1126 12/31/13 1540 12/31/13 2117 01/01/14 0559 01/01/14 1104  GLUCAP 173* 160* 146* 156* 169*    Signed:  Ebert Forrester  [facility] 01/01/2014, 1:23 PM

## 2014-01-01 NOTE — Progress Notes (Signed)
Pt. Alert and restless during the shift. Pt. Continues to pull at telemetry leads and nasal cannula disconnecting them. Pt. O2 saturation in the 70's on room air. RN reminded pt. Keep nasal cannula and telemetry leads in place for proper monitoring. Pt. Incooperative. Safety mittens were placed on pt. To assist with keeping cannula in place. Pt. Managed to remove mittens. On call NP, Tylene Fantasia made aware and at bedside to assess pt. O2 saturation remained in the 70s while pt. On room air. New orders received and implemented. Pt. Placed on 30% venti mask and order for soft wrist restraints received.  Pt. O2 sats now 91% on venti mask. Pts. Daughter Lesslie Mossa notified. RN will continue to assess pt. And monitor for changes in condition. Natarsha Hurwitz, Katherine Roan

## 2014-01-01 NOTE — Progress Notes (Signed)
Monitor discontinued for discharge and cleaned. CCMD notified. Continuous pulse ox discontinued. [facility] called and report given to [facility]

## 2014-01-01 NOTE — Progress Notes (Signed)
Patient being discharged. EMS here to take patient to [facility].

## 2014-01-01 NOTE — Progress Notes (Signed)
01/01/14 1157 placed patient on cpap with Loma Sousa RN  full face mack with 5L sats 100%.  Patient is stable. RT will continue to monitor.

## 2014-01-01 NOTE — Progress Notes (Signed)
Shift event: This NP paged by RN secondary to pt being restless, pulling off O2, and desatting into the 70s. NP to bedside. S: can not do ROS secondary to pt's mental status. RN reports putting mittens on pt earlier but this is not working now and pt conts to pull off O2. No increased WOB. O: Obese chronically ill appearing elderly AAF in NAD. Fidgeting in bed. Alert, uncooperative with O2. Resp effort is normal. O2 sat with 4L [place] on is 88%. Pt changed to 30% venti mask and O2 sat 91-93%.  A/P: 1. Agitation/encephalopathy leading to desaturation from not being cooperative with O2. Wrists restraints placed and 0.5mg  Haldol IV given x 1. After which, pt pretty quiet and O2 sats remain over 91%. Pt has a hx of OSA and was uncooperative for CPAP as well which also likely lead to desaturation. Will cont venti mask for now as she is satting normally. There is no increased WOB so I do not feel transfer to higher level of care is warranted. She is alert and responds to questions, so I don't question hypercarbia at present. In review of CT chest, pt also has pulmonary fibrosis. Will cont to follow.  Clance Boll, NP [facility]

## 2014-01-04 ENCOUNTER — Non-Acute Institutional Stay (SKILLED_NURSING_FACILITY): Payer: Medicare HMO | Admitting: Adult Health

## 2014-01-04 DIAGNOSIS — M549 Dorsalgia, unspecified: Secondary | ICD-10-CM

## 2014-01-04 DIAGNOSIS — E785 Hyperlipidemia, unspecified: Secondary | ICD-10-CM

## 2014-01-04 DIAGNOSIS — Z9989 Dependence on other enabling machines and devices: Secondary | ICD-10-CM

## 2014-01-04 DIAGNOSIS — K5900 Constipation, unspecified: Secondary | ICD-10-CM

## 2014-01-04 DIAGNOSIS — M4624 Osteomyelitis of vertebra, thoracic region: Secondary | ICD-10-CM

## 2014-01-04 DIAGNOSIS — R5381 Other malaise: Secondary | ICD-10-CM

## 2014-01-04 DIAGNOSIS — R531 Weakness: Secondary | ICD-10-CM

## 2014-01-04 DIAGNOSIS — I5031 Acute diastolic (congestive) heart failure: Secondary | ICD-10-CM

## 2014-01-04 DIAGNOSIS — N184 Chronic kidney disease, stage 4 (severe): Secondary | ICD-10-CM

## 2014-01-04 DIAGNOSIS — G4733 Obstructive sleep apnea (adult) (pediatric): Secondary | ICD-10-CM

## 2014-01-04 DIAGNOSIS — I119 Hypertensive heart disease without heart failure: Secondary | ICD-10-CM

## 2014-01-04 DIAGNOSIS — M869 Osteomyelitis, unspecified: Secondary | ICD-10-CM

## 2014-01-04 DIAGNOSIS — R5383 Other fatigue: Secondary | ICD-10-CM

## 2014-01-04 DIAGNOSIS — J8410 Pulmonary fibrosis, unspecified: Secondary | ICD-10-CM

## 2014-01-04 LAB — AFB CULTURE WITH SMEAR (NOT AT ARMC): Acid Fast Smear: NONE SEEN

## 2014-01-04 NOTE — Progress Notes (Signed)
Patient ID: [name] [name], female   DOB: 1930-08-12, 80 y.o.   MRN: [id number]     [facility]  Allergies  Allergen Reactions  . Motrin [Ibuprofen] Hives     Chief Complaint  Patient presents with  . Hospitalization Follow-up    HPI:  She has had numerous recent hospitalizations which started with thoracic osteomyelitis. She has had different hospitalizations for pneumonia and chf. She had been hospitalized once again for chf. Her ca++ level is elevated with an elevated pth. The ct scan of her chest demonstrated pulmonary fibrosis with possible sarcoidosis. The goal of her care remains to be short term rehab.   Past Medical History  Diagnosis Date  . Hypertension   . Gout   . Hypercholesteremia   . Diabetes mellitus   . CHF (congestive heart failure)   . Renal disorder   . Cancer   . Edema     Past Surgical History  Procedure Laterality Date  . Cesarean section    . Rotator cuff repair    . Tonsillectomy    . Tubal ligation      VITAL SIGNS BP 155/85  Pulse 96  Ht 5\' 6"  (1.676 m)  Wt 227 lb 11.2 oz (103.284 kg)  BMI 36.77 kg/m2   Patient's Medications  New Prescriptions   No medications on file  Previous Medications   ACETAMINOPHEN (TYLENOL) 325 MG TABLET    Take 650 mg by mouth every 4 (four) hours as needed for fever. *takes if temperature over 99.5*   CARVEDILOL (COREG) 25 MG TABLET    Take 1 tablet (25 mg total) by mouth 2 (two) times daily with a meal.   DEXTROSE 5 % SOLN 50 ML WITH CEFTRIAXONE 2 G SOLR 2 G    Inject 2 g into the vein daily. X 6 WEEKS, till 01/06/14   FEEDING SUPPLEMENT, ENSURE, (ENSURE) PUDG    Take 1 Container by mouth 3 (three) times daily between meals.   FUROSEMIDE (LASIX) 20 MG TABLET    Take 3 tablets (60 mg total) by mouth 2 (two) times daily.   HYDRALAZINE (APRESOLINE) 50 MG TABLET    Take 50 mg by mouth 4 (four) times daily.   HYDROCODONE-ACETAMINOPHEN (NORCO) 10-325 MG PER TABLET    Take 1 tablet by mouth every 6 (six) hours  as needed for moderate pain.   IPRATROPIUM-ALBUTEROL (DUONEB) 0.5-2.5 (3) MG/3ML SOLN    Take 3 mLs by nebulization every 2 (two) hours as needed (shortness of breath).   ISOSORBIDE MONONITRATE (IMDUR) 15 MG TB24 24 HR TABLET    Take 0.5 tablets (15 mg total) by mouth daily.   LIDOCAINE (LIDODERM) 5 %    Place 1 patch onto the skin daily. Remove & Discard patch within 12 hours or as directed by MD   MULTIPLE VITAMINS-MINERALS (MULTIVITAMIN WITH MINERALS) TABLET    Take 1 tablet by mouth daily.   POLYETHYLENE GLYCOL (MIRALAX / GLYCOLAX) PACKET    Take 17 g by mouth daily.   QUETIAPINE (SEROQUEL) 50 MG TABLET    Take 1 tablet (50 mg total) by mouth at bedtime.   SENNA (SENOKOT) 8.6 MG TABS TABLET    Take 2 tablets by mouth daily.   SIMVASTATIN (ZOCOR) 20 MG TABLET    Take 20 mg by mouth daily at 6 PM.   SODIUM CHLORIDE 0.9 % INJECTION    10-40 mLs by Intracatheter route as needed (flush).   SODIUM CHLORIDE 0.9 % SOLN 100 ML WITH VANCOMYCIN  500 MG SOLR 500 MG    Inject 500 mg into the vein daily. Until 01/06/14   VITAMIN C (ASCORBIC ACID) 500 MG TABLET    Take 500 mg by mouth daily.  Modified Medications   No medications on file  Discontinued Medications   No medications on file    SIGNIFICANT DIAGNOSTIC EXAMS  11-20-13: thoracic spine x-ray: 1. Highly unusual appearance of T10 and T11, as above, which is concern for potential discitis/osteomyelitis. Given the patient's history of recent fall, these findings could simply be related to acute trauma, however, the marked irregularity of the endplates at this level raises concern for infection. Correlation with MRI of the thoracic spine with and without IV gadolinium is recommended. 2. The appearance of the lung parenchyma suggests an underlying interstitial lung disease. This could be better evaluated with followup nonemergent high-resolution chest CT if clinicallyappropriate.  11-20-13: lumbar spine x-ray: 1. The appearance of T10 and T11 remains  concerning for potential discitis/osteomyelitis, and correlation with MRI of the thoracic spine with and without IV gadolinium is strongly recommended.2. No acute radiographic abnormality of the lumbar spine itself.  11-20-13: mri lumbar spine: Abnormal destructive changes of the T10-11 disc,  No MR findings of discitis osteomyelitis in the lumbar spine. Degenerative change of the lumbar spine: Moderate canal stenosis at L3-4, mild at L4-5. Neural foraminal narrowing L2-3 to L5-S1: Severe on the right at L5-S1. Mildly widened facets at L3-4, which can be associated with dynamic instability and could be further characterize with flexion and extension radiographs if clinically indicated.  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 Moderate canal stenosis at T10-11 with severe neural foraminal narrowing. Abnormal signal within T11 disc concerning for early discitis without osteomyelitis. Degenerative change at this level resulting in mild canal stenosis and moderate to severe right, moderate left neural foraminal narrowing. Grade 1 paraspinal muscle strain T10-11.    11-25-13: chest x-ray: 1. Right upper extremity PICC with the tip in the right atrium. This could be retracted 2 cm for positioning at the junction of the superior vena cava and right atrium. 2. Diffuse reticular pattern in the lungs suggesting chronic interstitial lung disease. Other differential considerations discussed above. 3. Borderline cardiomegaly.  12-10-13: chest x-ray: 1. Poor inspiration due to patient habitus. 2. Minimal cardiomegaly 3. Moderate to severe pulmonary vascular congestion 4. Small right pulmonary effusion. 5. Patchy pneumonitis or edema at mid to lower bilateral lungs.   12-10-13: kub: mild ileus no obstruction. Moderate diffuse retained colorectal stool no abnormal  calcifications seen   12-16-13: chest x-ray: Progressive accentuation of interstitial markings bilaterally which may represent pulmonary edema or pneumonitis.  12-17-13: 2-d echo: Left ventricle: The cavity size was normal. Wall thickness was increased in a pattern of moderate LVH. Systolic function was normal. The estimated ejection fraction was in the range of 60% to 65%. Wall motion was normal; there were no regional wall motion abnormalities. - Left atrium: The atrium was mildly dilated.- Pulmonary arteries: Systolic pressure was mildly increased.  12-18-13: chest x-ray: 1. The lungs are better inflated today. There remain bilateral interstitial and alveolar infiltrates. This coupled with the prominence of the cardiac silhouette and pulmonary vascularity suggests congestive heart failure. Certainly pneumonia cannot be excluded in the appropriate clinical setting. 2. A followup PA and lateral chest x-ray would be useful when the patient can tolerate the procedure.  12-18-13: bilateral lower extremity doppler: - No evidence of deep vein thrombosis involving the right   lower extremity.- No evidence of deep vein thrombosis involving the left lower extremity.  12-20-13: chest x-ray: 1. Right greater than left patchy alveolar interstitial densities, unchanged from the prior study. 2. New right PICC, well positioned.  No other change.   12-27-13: chest x-ray: No change from the prior exam.  12-28-13: ct of chest: 1. Apical predominant pulmonary fibrosis with superimposed airspace disease. Airspace disease may represent acute phase interstitial pneumonitis or pneumonia (multifocal pneumonia). Mediastinal adenopathy may be reactive. Adenopathy also raises possibility sarcoidosis accounting for alveolar opacities and adenopathy. 2. Small to moderate bilateral pleural effusions layering dependently. Collapse/consolidation of both lower lobes associated with the effusions. 3. Cardiomegaly and probable anemia.        LABS REVIEWED:   11-20-13: wbc 8.7; hgb 12.1; hct 37.3; mcv 86.1plt 202; glucose 125; bun 32; creat 1.85; k+3.9; na++144; ca++ 10.8; sed rate 27; BNP 894.4; hgb a1c 6.8; tsh 1.225 11-25-13: wbc 7.6; hgb 10.8; hct 34.1; mcv 86.3;plt 178; glucose 125; bun 20; creat 1.48; k+4.1; na++140; ca++ 10.2 BNP 1142  12-10-13: bun 15; creat 1.2 12-16-13: wbc 8.2; hgb 9.8 hct 30.2; mcv 83.0; plt 199; glucose 154; bun 39; creat 1.69 ;k+4.5;na++133 BNP: 11565.0; D-Dimer: 1.61; urine culture: no growth; influenza: negative.  12-27-13: wbc 8.0; hgb 9.4; hct 30.1; mcv 84.6; plt 231 glucose 182; bun 27; creat 1.42; k+5.5; na++137; ca++10.9;  BNP 7200 12-28-13: wbc 7.0; hgb 8.2; hct 25.8; mcv 84.;9 plt 231; glucose 126; bun 28; creat 1.70; k+5.0; na++137 ca++10.6; tsh 1.291; ACE 53; vit d 26; PTH 176.8 01-01-14: 158; bun 28; creat 1.93; k+4.3; na++ 139; ca++ 10.7     Review of Systems  Constitutional: Positive for malaise/fatigue.  Respiratory: Positive for shortness of breath. Negative for cough.        Is using 02  Cardiovascular: Negative for chest pain and palpitations.  Gastrointestinal: Negative for abdominal pain and constipation.  Musculoskeletal: Positive for back pain. Negative for myalgias.       Pain is managed  Skin: Negative.   Neurological: Positive for weakness.  Psychiatric/Behavioral: The patient is not nervous/anxious.      Physical Exam  Constitutional: She appears well-developed and well-nourished. No distress.  obese  Neck: Neck supple. No JVD present.  Cardiovascular: Normal rate, regular rhythm and intact distal pulses.   Respiratory: Effort normal. No respiratory distress. She has no wheezes.  02 dependent; breath sound diminished   GI: Soft. Bowel sounds are normal. She exhibits no distension. There is no tenderness.  Musculoskeletal: She exhibits no edema.  Is able to move all extremities; has significant weakness present in all extremities.   Neurological: She is alert.    Skin: Skin is warm and dry. She is not diaphoretic.       ASSESSMENT/ PLAN:  1. Chronic diastolic heart failure: is presently stable will continue lasix  60 mg twice daily  apresoline 50 mg four times daily and will monitor her status   2. Hypertensive heart disease: is stable will continue coreg 25 mg twice daily and apresoline 50 mg four times daily and imdur 15 mg daily  Will monitor  3. OSA: is without change in status; will continue CPAP at night and will monitor  4. Pulmonary fibrosis: as found on ct scan; no change in her status; will continue duoneb every 2 hours as needed and will monitor  5. Osteomyelitis in thoracic spine: will  continue vancomycin and ceftriaxone through 01-06-14. Will continue to monitor her status  6. Back pain: her back pain is being managed with lidoderm patch and vicodin 10/325 mg every 6 hours as needed  7. Constipation: will continue miralax daily; senna 2 tabs daily   8. Acute delirium: is presently on seroquel 50 mg nightly she is lethargic; will lower her seroquel to 25 mg nightly for one week will then stop this medication and will monitor her status.   9. Generalized weakness: she has significant weakness present with poor tolerance to activity; will continue therapy as directed and will continue to monitor her status.   10. Dyslipidemia: will continue zocor 20 mg daily    Time spent with patient 50 minutes      Ok Edwards NP [facility]  Contact [phone] Monday through Friday 8am- 5pm  After hours call [phone]

## 2014-01-05 DIAGNOSIS — J8410 Pulmonary fibrosis, unspecified: Secondary | ICD-10-CM | POA: Insufficient documentation

## 2014-01-05 DIAGNOSIS — G4733 Obstructive sleep apnea (adult) (pediatric): Secondary | ICD-10-CM | POA: Insufficient documentation

## 2014-01-05 DIAGNOSIS — Z9989 Dependence on other enabling machines and devices: Secondary | ICD-10-CM

## 2014-01-06 ENCOUNTER — Encounter: Payer: Self-pay | Admitting: Infectious Disease

## 2014-01-06 ENCOUNTER — Ambulatory Visit (INDEPENDENT_AMBULATORY_CARE_PROVIDER_SITE_OTHER): Payer: Medicare HMO | Admitting: Infectious Disease

## 2014-01-06 VITALS — BP 137/76 | HR 79 | Temp 98.4°F | Ht 62.0 in | Wt 219.0 lb

## 2014-01-06 DIAGNOSIS — I509 Heart failure, unspecified: Secondary | ICD-10-CM

## 2014-01-06 DIAGNOSIS — M4624 Osteomyelitis of vertebra, thoracic region: Secondary | ICD-10-CM

## 2014-01-06 DIAGNOSIS — M869 Osteomyelitis, unspecified: Secondary | ICD-10-CM

## 2014-01-06 LAB — SEDIMENTATION RATE: Sed Rate: 74 mm/hr — ABNORMAL HIGH (ref 0–22)

## 2014-01-06 LAB — C-REACTIVE PROTEIN: CRP: 1.3 mg/dL — ABNORMAL HIGH (ref <0.60)

## 2014-01-06 NOTE — Progress Notes (Signed)
Subjective:    Patient ID: [name] [name], female    DOB: 04-24-1930, 80 y.o.   MRN: [id number]  HPI   80 y.o. female with DM, HTN, CKD 4 who presents with worsening back pain and recent falls found to have MRI findings concerning for T10-11 disckitis/osteo. She underwent IR guided biopsy of the lesion for cultures while she was already on  antibiotics and cultures did not yield an organism. She had a coagulase-negative staphylococcal species isolated from one out of 2 blood cultures likely due to a contaminant.  She's been treated with vancomycin and ceftriaxone and I saw her in late January at which time she still had  severe lower back pain although is slightly better than when she was in the hospital.    I ordered MRI of spine and this was actually re-assuring.   Unfortunately patient has since been admitted twie to the hospital for respiratory failure on the first occasion treated for both CHF and HCAP (with vanco and cefepime) then transitioned back to vancomycin and rocephin then readmitted for CHF again  Her back pain has improved. She is without fevers chills nausea or malaise.   After finishing my visit with the patient initially her grandon arrived and voiced his concern re the IV antibiotic and was concerned they were "causing the pneumonias and heart failure."  I reassured him that the antibiotics were NOT causing pneumonia. I did however state that the IVF that came with IV antibiotics could complicate CHF  Her MAR from SNF showed vancomycin but now rocephin.      Review of Systems  Constitutional: Negative for fever, chills, diaphoresis, activity change, appetite change, fatigue and unexpected weight change.  HENT: Negative for congestion, rhinorrhea, sinus pressure, sneezing, sore throat and trouble swallowing.   Eyes: Negative for photophobia and visual disturbance.  Respiratory: Positive for shortness of breath. Negative for cough, chest tightness, wheezing and  stridor.   Gastrointestinal: Negative for nausea, vomiting, abdominal pain, diarrhea, constipation, blood in stool, abdominal distention and anal bleeding.  Genitourinary: Negative for dysuria, hematuria, flank pain and difficulty urinating.  Musculoskeletal: Positive for arthralgias and back pain. Negative for gait problem, joint swelling and myalgias.  Skin: Negative for color change, pallor, rash and wound.  Neurological: Positive for weakness. Negative for dizziness, tremors and light-headedness.  Hematological: Negative for adenopathy. Does not bruise/bleed easily.  Psychiatric/Behavioral: Negative for behavioral problems, confusion, sleep disturbance, dysphoric mood, decreased concentration and agitation.       Objective:   Physical Exam  Constitutional: She is oriented to person, place, and time. She appears well-nourished. No distress.  HENT:  Head: Normocephalic and atraumatic.  Eyes: Conjunctivae and EOM are normal. No scleral icterus.  Neck: Normal range of motion. Neck supple.  Cardiovascular: Normal rate and regular rhythm.   Pulmonary/Chest: Effort normal. No respiratory distress. She has no wheezes.  Abdominal: She exhibits no distension. There is no tenderness.  Neurological: She is alert and oriented to person, place, and time. She exhibits normal muscle tone. Coordination normal.  Her hip flexion and extension and right and left are 4/5 barely able to push against my hands in addition to gravity, flexion extension about the knee also 4/ 5 in dorsiflexion and flexion 4/5  Skin: Skin is warm and dry. She is not diaphoretic. No erythema. No pallor.  Psychiatric: Her behavior is normal. Judgment and thought content normal. She exhibits a depressed mood.    Skin PICC is Clean dry and intact and without  erythema              Assessment & Plan:   T10-11 disckitis/osteomyelitis: recheck ESR and CRP today. If reassuring ask SNF to DC her PICC. Today was last day of  planned vancomcyin  If labs reassuring then RTC in 2 months  I spent greater than 25 minutes with the patient including greater than 50% of time in face to face counsel of the patient and in coordination of their care.   CHF: certainly volume of IVF and desire to avoid dehydrating her and exposing her to risk of nephrotoxicity could have played a role.  CNS 1/2 from blood: likely was contaminant and is more than treated by now.

## 2014-01-06 NOTE — Patient Instructions (Signed)
We will recheck your inflammatory markers and if they are as reassuring as your exam is we will stop your vancomycin and have PICC pulled

## 2014-01-07 ENCOUNTER — Encounter: Payer: Self-pay | Admitting: Adult Health

## 2014-01-07 ENCOUNTER — Non-Acute Institutional Stay (SKILLED_NURSING_FACILITY): Payer: Medicare HMO | Admitting: Adult Health

## 2014-01-07 ENCOUNTER — Telehealth: Payer: Self-pay | Admitting: Infectious Disease

## 2014-01-07 DIAGNOSIS — I5032 Chronic diastolic (congestive) heart failure: Secondary | ICD-10-CM

## 2014-01-07 DIAGNOSIS — D869 Sarcoidosis, unspecified: Secondary | ICD-10-CM

## 2014-01-07 DIAGNOSIS — M47814 Spondylosis without myelopathy or radiculopathy, thoracic region: Secondary | ICD-10-CM

## 2014-01-07 DIAGNOSIS — J8410 Pulmonary fibrosis, unspecified: Secondary | ICD-10-CM

## 2014-01-07 DIAGNOSIS — E213 Hyperparathyroidism, unspecified: Secondary | ICD-10-CM

## 2014-01-07 DIAGNOSIS — Z9989 Dependence on other enabling machines and devices: Secondary | ICD-10-CM

## 2014-01-07 DIAGNOSIS — G4733 Obstructive sleep apnea (adult) (pediatric): Secondary | ICD-10-CM

## 2014-01-07 DIAGNOSIS — M4620 Osteomyelitis of vertebra, site unspecified: Secondary | ICD-10-CM

## 2014-01-07 MED ORDER — DOXYCYCLINE HYCLATE 100 MG PO TABS: 100.0000 mg | ORAL_TABLET | Freq: Two times a day (BID) | ORAL | Status: DC

## 2014-01-07 MED ORDER — AMOXICILLIN 500 MG PO CAPS: 500.0000 mg | ORAL_CAPSULE | Freq: Two times a day (BID) | ORAL | Status: DC

## 2014-01-07 NOTE — Telephone Encounter (Signed)
Patients ESR is actually [facility] worse. But symptoms are better and she has completed the proper duration of antibiotics.   Patient's family are worried IV antibiotics are "causing her pneumonia's and heart failiure" which they are not other than fact that fluid in IV esp vancomycin may be complicating things.   I would DC her PICC line and start her on  Doxycyline 132m bid orally  And   Amoxicillin 5044mpo bid  She should contiinue these until she is seen by me.   IF she has worsening back pain we need immediate repeat T spine MRI

## 2014-01-07 NOTE — Progress Notes (Signed)
Patient ID: [name] [name], female   DOB: 10/07/1930, 80 y.o.   MRN: [id number]     [facility]  Allergies  Allergen Reactions  . Motrin [Ibuprofen] Hives     Chief Complaint  Patient presents with  . Acute Visit    family concerns    HPI:  I have had a extensive conversation with her family regarding her overall status. They are concerned that she continues to decline and has required several recent hospitalizations. She has moved from [place] in Dec 2014; and has spent nearly all that time since in the hospital.  She is depressed; has a poor appetite; has pain management issues. Her family is concerned about her use of lasix stating that her previous doctor in [place] told her to avoid lasix due to renal issues. She was taking a different diuretic but the family does not know what. Her family is aware that she is unable to tolerate the use of her cpap at this time and is interested in trying different masks as able. Her family was unaware of the ct of chest results and is interested in having to be seen by pulmonology.    Past Medical History  Diagnosis Date  . Hypertension   . Gout   . Hypercholesteremia   . Diabetes mellitus   . CHF (congestive heart failure)   . Renal disorder   . Cancer   . Edema     Past Surgical History  Procedure Laterality Date  . Cesarean section    . Rotator cuff repair    . Tonsillectomy    . Tubal ligation      VITAL SIGNS BP 128/74  Pulse 75  Ht 5' (1.524 m)  Wt 227 lb 11.2 oz (103.284 kg)  BMI 44.47 kg/m2   Patient's Medications  New Prescriptions   No medications on file  Previous Medications   ACETAMINOPHEN (TYLENOL) 325 MG TABLET    Take 650 mg by mouth every 4 (four) hours as needed for fever. *takes if temperature over 99.5*   AMOXICILLIN (AMOXIL) 500 MG CAPSULE    Take 1 capsule (500 mg total) by mouth 2 (two) times daily.   CARVEDILOL (COREG) 25 MG TABLET    Take 1 tablet (25 mg total) by mouth 2 (two) times daily with a  meal.   DOXYCYCLINE (VIBRA-TABS) 100 MG TABLET    Take 1 tablet (100 mg total) by mouth 2 (two) times daily.   FEEDING SUPPLEMENT, ENSURE, (ENSURE) PUDG    Take 1 Container by mouth 3 (three) times daily between meals.   FUROSEMIDE (LASIX) 20 MG TABLET    Take 3 tablets (60 mg total) by mouth 2 (two) times daily.   HYDRALAZINE (APRESOLINE) 50 MG TABLET    Take 50 mg by mouth 4 (four) times daily.   HYDROCODONE-ACETAMINOPHEN (NORCO) 10-325 MG PER TABLET    Take 1 tablet by mouth every 6 (six) hours as needed for moderate pain.   IPRATROPIUM-ALBUTEROL (DUONEB) 0.5-2.5 (3) MG/3ML SOLN    Take 3 mLs by nebulization every 2 (two) hours as needed (shortness of breath).   ISOSORBIDE MONONITRATE (IMDUR) 15 MG TB24 24 HR TABLET    Take 0.5 tablets (15 mg total) by mouth daily.   LIDOCAINE (LIDODERM) 5 %    Place 1 patch onto the skin daily. Remove & Discard patch within 12 hours or as directed by MD   MULTIPLE VITAMINS-MINERALS (MULTIVITAMIN WITH MINERALS) TABLET    Take 1 tablet by mouth daily.  POLYETHYLENE GLYCOL (MIRALAX / GLYCOLAX) PACKET    Take 17 g by mouth daily.   QUETIAPINE (SEROQUEL) 50 MG TABLET    Take 1 tablet (50 mg total) by mouth at bedtime.   SENNA (SENOKOT) 8.6 MG TABS TABLET    Take 2 tablets by mouth daily.   SIMVASTATIN (ZOCOR) 20 MG TABLET    Take 20 mg by mouth daily at 6 PM.   VITAMIN C (ASCORBIC ACID) 500 MG TABLET    Take 500 mg by mouth daily.  Modified Medications   No medications on file  Discontinued Medications   DEXTROSE 5 % SOLN 50 ML WITH CEFTRIAXONE 2 G SOLR 2 G    Inject 2 g into the vein daily. X 6 WEEKS, till 01/06/14   SODIUM CHLORIDE 0.9 % INJECTION    10-40 mLs by Intracatheter route as needed (flush).   SODIUM CHLORIDE 0.9 % SOLN 100 ML WITH VANCOMYCIN 500 MG SOLR 500 MG    Inject 500 mg into the vein daily. Until 01/06/14    SIGNIFICANT DIAGNOSTIC EXAMS  11-20-13: thoracic spine x-ray: 1. Highly unusual appearance of T10 and T11, as above, which is concern  for potential discitis/osteomyelitis. Given the patient's history of recent fall, these findings could simply be related to acute trauma, however, the marked irregularity of the endplates at this level raises concern for infection. Correlation with MRI of the thoracic spine with and without IV gadolinium is recommended. 2. The appearance of the lung parenchyma suggests an underlying interstitial lung disease. This could be better evaluated with followup nonemergent high-resolution chest CT if clinicallyappropriate.  11-20-13: lumbar spine x-ray: 1. The appearance of T10 and T11 remains concerning for potential discitis/osteomyelitis, and correlation with MRI of the thoracic spine with and without IV gadolinium is strongly recommended.2. No acute radiographic abnormality of the lumbar spine itself.  11-20-13: mri lumbar spine: Abnormal destructive changes of the T10-11 disc,  No MR findings of discitis osteomyelitis in the lumbar spine. Degenerative change of the lumbar spine: Moderate canal stenosis at L3-4, mild at L4-5. Neural foraminal narrowing L2-3 to L5-S1: Severe on the right at L5-S1. Mildly widened facets at L3-4, which can be associated with dynamic instability and could be further characterize with flexion and extension radiographs if clinically indicated.  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 Moderate canal stenosis at T10-11 with severe neural foraminal narrowing. Abnormal signal within T11 disc concerning for early discitis without osteomyelitis. Degenerative change at this level resulting in mild canal stenosis and moderate to severe right, moderate left neural foraminal narrowing. Grade 1 paraspinal muscle strain T10-11.    11-25-13: chest x-ray: 1. Right upper extremity PICC with the tip in the right atrium. This could be  retracted 2 cm for positioning at the junction of the superior vena cava and right atrium. 2. Diffuse reticular pattern in the lungs suggesting chronic interstitial lung disease. Other differential considerations discussed above. 3. Borderline cardiomegaly.  12-10-13: chest x-ray: 1. Poor inspiration due to patient habitus. 2. Minimal cardiomegaly 3. Moderate to severe pulmonary vascular congestion 4. Small right pulmonary effusion. 5. Patchy pneumonitis or edema at mid to lower bilateral lungs.   12-10-13: kub: mild ileus no obstruction. Moderate diffuse retained colorectal stool no abnormal calcifications seen   12-16-13: chest x-ray: Progressive accentuation of interstitial markings bilaterally which may represent pulmonary edema or pneumonitis.  12-17-13: 2-d  echo: Left ventricle: The cavity size was normal. Wall thickness was increased in a pattern of moderate LVH. Systolic function was normal. The estimated ejection fraction was in the range of 60% to 65%. Wall motion was normal; there were no regional wall motion abnormalities. - Left atrium: The atrium was mildly dilated.- Pulmonary arteries: Systolic pressure was mildly increased.  12-18-13: chest x-ray: 1. The lungs are better inflated today. There remain bilateral interstitial and alveolar infiltrates. This coupled with the prominence of the cardiac silhouette and pulmonary vascularity suggests congestive heart failure. Certainly pneumonia cannot be excluded in the appropriate clinical setting. 2. A followup PA and lateral chest x-ray would be useful when the patient can tolerate the procedure.    12-18-13: bilateral lower extremity doppler: - No evidence of deep vein thrombosis involving the right   lower extremity.- No evidence of deep vein thrombosis involving the left lower extremity.  12-20-13: chest x-ray: 1. Right greater than left patchy alveolar interstitial densities, unchanged from the prior study. 2. New right PICC, well positioned.   No other change.   12-27-13: chest x-ray: No change from the prior exam.  12-28-13: ct of chest: 1. Apical predominant pulmonary fibrosis with superimposed airspace disease. Airspace disease may represent acute phase interstitial pneumonitis or pneumonia (multifocal pneumonia). Mediastinal adenopathy may be reactive. Adenopathy also raises possibility sarcoidosis accounting for alveolar opacities and adenopathy. 2. Small to moderate bilateral pleural effusions layering dependently. Collapse/consolidation of both lower lobes associated with the effusions. 3. Cardiomegaly and probable anemia.       LABS REVIEWED:   11-20-13: wbc 8.7; hgb 12.1; hct 37.3; mcv 86.1plt 202; glucose 125; bun 32; creat 1.85; k+3.9; na++144; ca++ 10.8; sed rate 27; BNP 894.4; hgb a1c 6.8; tsh 1.225 11-25-13: wbc 7.6; hgb 10.8; hct 34.1; mcv 86.3;plt 178; glucose 125; bun 20; creat 1.48; k+4.1; na++140; ca++ 10.2 BNP 1142  12-10-13: bun 15; creat 1.2 12-16-13: wbc 8.2; hgb 9.8 hct 30.2; mcv 83.0; plt 199; glucose 154; bun 39; creat 1.69 ;k+4.5;na++133 BNP: 11565.0; D-Dimer: 1.61; urine culture: no growth; influenza: negative.  12-27-13: wbc 8.0; hgb 9.4; hct 30.1; mcv 84.6; plt 231 glucose 182; bun 27; creat 1.42; k+5.5; na++137; ca++10.9;  BNP 7200 12-28-13: wbc 7.0; hgb 8.2; hct 25.8; mcv 84.;9 plt 231; glucose 126; bun 28; creat 1.70; k+5.0; na++137 ca++10.6; tsh 1.291; ACE 53; vit d 26; PTH 176.8 01-01-14: 158; bun 28; creat 1.93; k+4.3; na++ 139; ca++ 10.7     Review of Systems  Constitutional: Positive for malaise/fatigue.  Respiratory: Positive for shortness of breath. Negative for cough.        Is using 02  Cardiovascular: Negative for chest pain and palpitations.  Gastrointestinal: Negative for abdominal pain and constipation.  Musculoskeletal: Positive for back pain. Negative for myalgias.       Pain is managed  Skin: Negative.   Neurological: Positive for weakness.  Psychiatric/Behavioral: The patient is not  nervous/anxious.      Physical Exam  Constitutional: She appears well-developed and well-nourished. No distress.  obese  Neck: Neck supple. No JVD present.  Cardiovascular: Normal rate, regular rhythm and intact distal pulses.   Respiratory: Effort normal. No respiratory distress. She has no wheezes.  02 dependent; breath sound diminished   GI: Soft. Bowel sounds are normal. She exhibits no distension. There is no tenderness.  Musculoskeletal: She exhibits no edema.  Is able to move all extremities; has significant weakness present in all extremities.   Neurological: She is alert.  Skin:  Skin is warm and dry. She is not diaphoretic.      ASSESSMENT/ PLAN:  1. Chronic diastolic heart failure: will stop the lasix and will begin demadex 20 mg twice daily; will place her on daily weights and will check bmp in 2 weeks will monitor her status.  Overall her status is without change.   2. Depression: will begin her on cymbalta 30 mg daily for one week then 60 mg daily; will monitor her status; the goals of this medication are to help significantly improve her mood status to allow her to better participate in in therapy and her own ADL care; to help improve upon her appetite and to help manage her pain.  3. OSA: she is unable to tolerate the make she presently has; will need to be seen by pulmonology in order evaluate and to try different options for her in order to better meet her needs. The nursing will continue to encourage her to use the cpap nightly   4. Possible sarcoid: there is no change in her overall status at this time; will setup a pulmonology consult for her.   5. Hyperparathyroidism: her pth is 176.8 with a ca++ of 10.6 will monitor her status.   Time spent with patient 50 minutes.      Ok Edwards NP [facility]  Contact [phone] Monday through Friday 8am- 5pm  After hours call [phone]

## 2014-01-10 ENCOUNTER — Other Ambulatory Visit: Payer: Self-pay | Admitting: Adult Health

## 2014-01-10 DIAGNOSIS — E213 Hyperparathyroidism, unspecified: Secondary | ICD-10-CM | POA: Insufficient documentation

## 2014-01-10 HISTORY — DX: Hyperparathyroidism, unspecified: E21.3

## 2014-01-11 ENCOUNTER — Non-Acute Institutional Stay (SKILLED_NURSING_FACILITY): Payer: Medicare HMO | Admitting: Internal Medicine

## 2014-01-11 DIAGNOSIS — F32A Depression, unspecified: Secondary | ICD-10-CM

## 2014-01-11 DIAGNOSIS — R0989 Other specified symptoms and signs involving the circulatory and respiratory systems: Secondary | ICD-10-CM

## 2014-01-11 DIAGNOSIS — R5381 Other malaise: Secondary | ICD-10-CM

## 2014-01-11 DIAGNOSIS — I5032 Chronic diastolic (congestive) heart failure: Secondary | ICD-10-CM

## 2014-01-11 DIAGNOSIS — R0600 Dyspnea, unspecified: Secondary | ICD-10-CM

## 2014-01-11 DIAGNOSIS — M869 Osteomyelitis, unspecified: Secondary | ICD-10-CM

## 2014-01-11 DIAGNOSIS — R0609 Other forms of dyspnea: Secondary | ICD-10-CM

## 2014-01-11 DIAGNOSIS — F3289 Other specified depressive episodes: Secondary | ICD-10-CM

## 2014-01-11 DIAGNOSIS — K5900 Constipation, unspecified: Secondary | ICD-10-CM

## 2014-01-11 DIAGNOSIS — J8410 Pulmonary fibrosis, unspecified: Secondary | ICD-10-CM

## 2014-01-11 DIAGNOSIS — M4624 Osteomyelitis of vertebra, thoracic region: Secondary | ICD-10-CM

## 2014-01-11 DIAGNOSIS — F329 Major depressive disorder, single episode, unspecified: Secondary | ICD-10-CM

## 2014-01-11 NOTE — Progress Notes (Signed)
Patient ID: [name] [name], female   DOB: 1930/07/26, 80 y.o.   MRN: [id number]    [facility]   PCP: No PCP Per Patient   Allergies  Allergen Reactions  . Motrin [Ibuprofen] Hives    Chief Complaint: new admit  HPI:  80 y/o female patient is here for rehabilitation after hospital admission for chf exacerbation. Ct chest showed pulmonary fibrosis and sarcoidosis changes. She has had frequent hospital admission for chf and penumonia. She has history of thoracic osteomyelitis. She has been followed by ID and was recently switched to oral antibiotics. Given her fluid overload and impaired renal function, she has been switched to demadex from lasix and seems to be tolerating it fine. She is seen in her room today. She gets tired easily and is bed bound.   Review of Systems:  Constitutional: Negative for fever, chills, weight loss. Has malaise/ fatigue HENT: Negative for congestion, hearing loss and sore throat.   Eyes: Negative for blurred vision, double vision and discharge.  Respiratory: positive for dyspnea.on oxygen. Negative for cough, sputum production, wheezing.   Cardiovascular: Negative for chest pain, palpitations, Gastrointestinal: Negative for heartburn, nausea, vomiting, abdominal pain, diarrhea  Genitourinary: Negative for dysuria, urgency, frequency and flank pain.  Musculoskeletal: Negative for falls. Has back pain Skin: Negative for itching and rash.  Neurological: Positive for weakness. Negative for dizziness, tingling, focal weakness and headaches.  Psychiatric/Behavioral: Negative for depression and memory loss. The patient is not nervous/anxious.     Past Medical History  Diagnosis Date  . Hypertension   . Gout   . Hypercholesteremia   . Diabetes mellitus   . CHF (congestive heart failure)   . Renal disorder   . Cancer   . Edema    Past Surgical History  Procedure Laterality Date  . Cesarean section    . Rotator cuff repair    . Tonsillectomy     . Tubal ligation     Social History:   reports that she quit smoking about 34 years ago. She does not have any smokeless tobacco history on file. She reports that she does not drink alcohol or use illicit drugs.  Family History  Problem Relation Age of Onset  . Hypertension Mother   . Hypertension Father   . Hypertension Daughter     Medications: Patient's Medications  New Prescriptions   No medications on file  Previous Medications   ACETAMINOPHEN (TYLENOL) 325 MG TABLET    Take 650 mg by mouth every 4 (four) hours as needed for fever. *takes if temperature over 99.5*   AMOXICILLIN (AMOXIL) 500 MG CAPSULE    Take 1 capsule (500 mg total) by mouth 2 (two) times daily.   CARVEDILOL (COREG) 25 MG TABLET    Take 1 tablet (25 mg total) by mouth 2 (two) times daily with a meal.   DOXYCYCLINE (VIBRA-TABS) 100 MG TABLET    Take 1 tablet (100 mg total) by mouth 2 (two) times daily.   FEEDING SUPPLEMENT, ENSURE, (ENSURE) PUDG    Take 1 Container by mouth 3 (three) times daily between meals.   FUROSEMIDE (LASIX) 20 MG TABLET    Take 3 tablets (60 mg total) by mouth 2 (two) times daily.   HYDRALAZINE (APRESOLINE) 50 MG TABLET    Take 50 mg by mouth 4 (four) times daily.   HYDROCODONE-ACETAMINOPHEN (NORCO) 10-325 MG PER TABLET    Take 1 tablet by mouth every 6 (six) hours as needed for moderate pain.  IPRATROPIUM-ALBUTEROL (DUONEB) 0.5-2.5 (3) MG/3ML SOLN    Take 3 mLs by nebulization every 2 (two) hours as needed (shortness of breath).   ISOSORBIDE MONONITRATE (IMDUR) 15 MG TB24 24 HR TABLET    Take 0.5 tablets (15 mg total) by mouth daily.   LIDOCAINE (LIDODERM) 5 %    Place 1 patch onto the skin daily. Remove & Discard patch within 12 hours or as directed by MD   MULTIPLE VITAMINS-MINERALS (MULTIVITAMIN WITH MINERALS) TABLET    Take 1 tablet by mouth daily.   POLYETHYLENE GLYCOL (MIRALAX / GLYCOLAX) PACKET    Take 17 g by mouth daily.   QUETIAPINE (SEROQUEL) 50 MG TABLET    Take 1 tablet (50  mg total) by mouth at bedtime.   SENNA (SENOKOT) 8.6 MG TABS TABLET    Take 2 tablets by mouth daily.   SIMVASTATIN (ZOCOR) 20 MG TABLET    Take 20 mg by mouth daily at 6 PM.   VITAMIN C (ASCORBIC ACID) 500 MG TABLET    Take 500 mg by mouth daily.  Modified Medications   No medications on file  Discontinued Medications   No medications on file     Physical Exam: BP 138/70  Pulse 82  Temp(Src) 98.2 F (36.8 C)  Resp 18  SpO2 94%  Constitutional: She is oriented to person, place, and time. In no acute distress. obese HENT:   Head: Normocephalic and atraumatic.  Mouth/Throat: Oropharynx is clear and moist. No oropharyngeal exudate.  Eyes: Pupils are equal, round, and reactive to light.  Neck: Normal range of motion. Neck supple. No JVD present Cardiovascular: Normal rate, regular rhythm, normal heart sounds and intact distal pulses.   Pulmonary/Chest: Effort normal and breath sounds diminished bilaterally. No respiratory distress. No wheeze or crackles noted. On o2 Abdominal: Soft. Bowel sounds are normal. She exhibits no distension and no mass. There is no tenderness. There is no rebound and no guarding.  Musculoskeletal: generalized weakness. She exhibits trace edema  Neurological: She is alert and oriented to person, place, and time.  Skin: Skin is warm and dry  Psychiatric: She has a normal mood and affect. Her behavior is normal. Judgment and thought content normal.     Labs reviewed: Basic Metabolic Panel:  Recent Labs  65/78/46 0520 12/31/13 0500 01/01/14 0551  NA 140 139 139  K 4.5 4.2 4.3  CL 100 96 97  CO2 29 29 33*  GLUCOSE 134* 142* 158*  BUN 29* 29* 28*  CREATININE 1.91* 1.80* 1.93*  CALCIUM 10.4 10.7* 10.7*   Liver Function Tests:  Recent Labs  12/17/13 0128  AST 43*  ALT 56*  ALKPHOS 102  BILITOT <0.2*  PROT 6.4  ALBUMIN 2.9*   No results found for this basename: LIPASE, AMYLASE,  in the last 8760 hours No results found for this basename:  AMMONIA,  in the last 8760 hours CBC:  Recent Labs  12/16/13 2021 12/17/13 0128  12/19/13 0450 12/27/13 1020 12/28/13 0513  WBC 8.2 8.6  < > 8.0 8.0 7.0  NEUTROABS 5.8 6.2  --   --  5.8  --   HGB 9.8* 9.7*  < > 9.6* 9.4* 8.2*  HCT 30.2* 29.8*  < > 28.7* 30.1* 25.8*  MCV 83.0 83.9  < > 82.9 84.6 84.9  PLT 199 193  < > 250 231 231  < > = values in this interval not displayed. Cardiac Enzymes:  Recent Labs  12/16/13 1812 12/27/13 1020  TROPONINI <0.30 <0.30  BNP: No components found with this basename: POCBNP,  CBG:  Recent Labs  01/01/14 0559 01/01/14 1104 01/01/14 1701  GLUCAP 156* 169* 145*    Radiological Exams: Dg Chest 2 View  12/27/2013   CLINICAL DATA:  Congestive failure  EXAM: CHEST  2 VIEW  COMPARISON:  12/20/2013  FINDINGS: Right-sided PICC line is again identified and stable. Cardiomegaly with diffuse vascular congestion and interstitial infiltrates is identified. The overall appearance is stable. No new focal abnormality is seen. Degenerative changes of the shoulder joints are noted bilaterally.  IMPRESSION: No change from the prior exam.   Electronically Signed   By: Inez Catalina M.D.   On: 12/27/2013 10:37   Ct Chest Wo Contrast  12/28/2013   CLINICAL DATA:  Persistent infiltrates.  EXAM: CT CHEST WITHOUT CONTRAST  TECHNIQUE: Multidetector CT imaging of the chest was performed following the standard protocol without IV contrast.  COMPARISON:  DG CHEST 2 VIEW dated 12/27/2013; DG CHEST 1V PORT dated 12/20/2013; DG CHEST 1V PORT dated 12/18/2013; DG CHEST 1V PORT dated 12/16/2013; DG THORACIC SPINE W/SWIMMERS dated 11/19/2013  FINDINGS: There are small moderate bilateral pleural effusions which layer dependently. Cardiomegaly is present with low-attenuation the intravascular compartment suggesting anemia. Incidental imaging of the upper abdomen demonstrates atherosclerosis and cholecystectomy and a left interpolar renal cystic lesion, probably a cyst. . Collapse/  consolidation of both lower lobes associated with effusions. Aortic and branch vessel atherosclerosis. Right upper extremity PICC is present with the tip terminating at the cavoatrial junction.  Lungs demonstrate changes of pulmonary fibrosis, which is apical predominant. There is superimposed airspace disease which is present diffusely. This may represent acute phase interstitial pneumonitis although infection can have a similar appearance.  14 mm prevascular lymph node is present, enlarged which may be reactive. Low right peritracheal lymphadenopathy is present, also measuring 14 mm. Suboptimal evaluation of hilar adenopathy without contrast. Central airways appear patent. Severe bilateral glenohumeral osteoarthritis is present.  IMPRESSION: 1. Apical predominant pulmonary fibrosis with superimposed airspace disease. Airspace disease may represent acute phase interstitial pneumonitis or pneumonia (multifocal pneumonia). Mediastinal adenopathy may be reactive. Adenopathy also raises possibility sarcoidosis accounting for alveolar opacities and adenopathy. 2. Small to moderate bilateral pleural effusions layering dependently. Collapse/consolidation of both lower lobes associated with the effusions. 3. Cardiomegaly and probable anemia.   Electronically Signed   By: Dereck Ligas M.D.   On: 12/28/2013 01:08   Dg Chest Port 1 View  12/20/2013   CLINICAL DATA:  Hypoxia with chest congestion and cough.  EXAM: PORTABLE CHEST - 1 VIEW  COMPARISON:  12/18/2013  FINDINGS: Bilateral irregular interstitial airspace opacities are stable, noted throughout the right lung and in the left lung base.  Cardiac silhouette is mildly enlarged.  There is a new right PICC with its tip near the caval atrial junction.  No pneumothorax.  IMPRESSION: 1. Right greater than left patchy alveolar interstitial densities, unchanged from the prior study. 2. New right PICC, well positioned.  No other change.   Electronically Signed   By: Lajean Manes M.D.   On: 12/20/2013 13:57   Dg Chest Port 1 View  12/18/2013   CLINICAL DATA:  Hypoxia and infiltrates  EXAM: PORTABLE CHEST - 1 VIEW  COMPARISON:  Portable chest x-ray of December 16, 2013.  FINDINGS: The lungs are adequately inflated. The pulmonary interstitial markings remain increased. The lung markings become confluent in the lower lobes. The cardiac silhouette remains enlarged. The pulmonary vascularity is indistinct. There is no definite  pleural effusion. A right-sided PICC line is in place with the tip lying in the midportion of the SVC. There are degenerative changes of both shoulders.  IMPRESSION: 1. The lungs are better inflated today. There remain bilateral interstitial and alveolar infiltrates. This coupled with the prominence of the cardiac silhouette and pulmonary vascularity suggests congestive heart failure. Certainly pneumonia cannot be excluded in the appropriate clinical setting. 2. A followup PA and lateral chest x-ray would be useful when the patient can tolerate the procedure.   Electronically Signed   By: David  Martinique   On: 12/18/2013 14:37   Dg Chest Port 1 View  12/16/2013   CLINICAL DATA:  Shortness of breath.  EXAM: PORTABLE CHEST - 1 VIEW  COMPARISON:  11/25/2013  FINDINGS: PICC tip is at the cavoatrial junction. There is diffuse increased interstitial accentuation bilaterally which may represent pulmonary edema or pneumonitis. Increased density at the right lung base may represent a small effusion.  Severe arthritic changes seen at both shoulders.  Pulmonary vascularity is within normal limits. Heart size is at the upper limits of normal.  IMPRESSION: Progressive accentuation of interstitial markings bilaterally which may represent pulmonary edema or pneumonitis.   Electronically Signed   By: Rozetta Nunnery M.D.   On: 12/16/2013 17:28      Assessment/Plan  Deconditioning- in setting of her heart failure, pulmonary fibrosis and infection with frequent hospital  admissions. She will need vigourous therapy sessions, encourage to be out of bed, fall precautions. nutrtional supplement to be continued  Chronic diastolic heart failure- stable at present. Monitor weight and renal function. Continue demadex 20 mg bid for now. Continue coreg, apresoline and imdur.  Dyspnea- chf, pulmonary fibrosis, pleural effusion (seen on ct chest) and sarcoidosis with her deconditioning could all be contributing to this. She also has OSA. Pt refuses to use CPAP. Continue duoneb and demadex with oxygen. Will continue her working with therapy team. Has symmetrical edema in legs making dvt less likely. If no improvement, consider further adjustment in dose of torsemide and repeat ct chest with PE protocol (limited mobility) to rule out pulmonary emboli and also to assess for worsening pleural effusion   Pulmonary fibrosis and sarcoidosis- has upcoming pulmonary appointment for further evaluation and treatment option  Thoracic spine osteomyelitis- continue doxycycline and amoxicillin, has follow up with ID. Monitor wbc and temp curve. Continue lidoderm patch and prn vicodin for pain  Depression- present. Continue cymbalta and monitor. Encourage therapy participation.    Constipation- continue senna s and miralax   Family/ staff Communication: reviewed care plan with patient and nursing supervisor  Goals of care: STR but I see her as LTC resident here   Labs/tests ordered: bmp    Blanchie Serve, MD  [place] [phone] (Monday-Friday 8 am - 5 pm) [phone] (afterhours)

## 2014-01-15 ENCOUNTER — Ambulatory Visit (INDEPENDENT_AMBULATORY_CARE_PROVIDER_SITE_OTHER): Payer: Medicare HMO | Admitting: Internal Medicine

## 2014-01-15 ENCOUNTER — Encounter: Payer: Self-pay | Admitting: Internal Medicine

## 2014-01-15 VITALS — BP 128/60 | HR 67

## 2014-01-15 DIAGNOSIS — J9620 Acute and chronic respiratory failure, unspecified whether with hypoxia or hypercapnia: Secondary | ICD-10-CM

## 2014-01-15 DIAGNOSIS — J8410 Pulmonary fibrosis, unspecified: Secondary | ICD-10-CM

## 2014-01-15 NOTE — Patient Instructions (Addendum)
I discused with Dr Bubba Camp on phone and shared  My assessment and plan I do not think sarcoid is an active problems at this moment Suggest you get worked up and treated for heart failure Also, recommend doppler legs rule out DVT in legs IF above are negative, recommended CT angio chest (provided kidney permits the test)  FOllowup  4 weeks with me or NP to review progress Might need to consider repeat CT chest wo contrast as well to figure out ILD issues b

## 2014-01-15 NOTE — Assessment & Plan Note (Signed)
DDx is Acute on chronic diastolic CHF, DVT/PE. Doubt this is sarcoid related. The Bilateral UL fibrosis in her looks like burnt out sarcoid; we do not have old films for comparison. For now,  given sub-acuity sort out first 2 issues.   I have d/w DR Dorothey Baseman over phone and she will implement the workup and Rx for this  Family and patient agreeable with plan

## 2014-01-15 NOTE — Assessment & Plan Note (Signed)
Need to reassess this later on after optimization of CHF and ruling out DVT/PE

## 2014-01-15 NOTE — Progress Notes (Signed)
Subjective:    Patient ID: [name] [name], female    DOB: 1930/01/18, 80 y.o.   MRN: [id number] PCP No PCP Per Patient currently [facility] the physician there is Dr. Oneal GroutMahima Pandey  HPI  IOV 01/15/2014  Chief Complaint  Patient presents with  . Pulmonary Consult    pt c/o SOB with rest and talking and occasional cough.     [age] year old female cognitively intact. Obese African American female. Baseline in 2014 was completely ambulatory with a walker and cognitively intact at home and with chronic renal insufficiency. History is obtained from speaking to Dr. Glade LloydPandey, review of charts and talking to patient and her family .Then admitted between 11/19/2013 and January 2015 with acute back pain and diagnosed have acute thoracic 10 discitis with zoster myelitis with possible gram-positive cocci bacteremia. This left her bed bound. Since then she has been on antibiotic therapy. Re admitted 12/16/2013 through 12/21/2013 acute respiratory failure with hypoxemia the diagnosis of multilobar bilateral H. cap versus viral pneumonitis. Course was complicated by delirium. Then readmitted again 12/27/2013 through 01/01/2014 with a diagnosis of acute diastolic heart failure and New York Heart Association class IV and hypertensive heart disease. A few days prior to discharge a CT scan of the chest showed findings of heart failure bilateral effusions but also showed bilateral upper lobe fibrotic lung disease suggestive of sarcoidosis.   Since discharge he is been in the [facility] According to the family she is only been dyspneic for the last several weeks although the medical record review shows she's been dyspneic for a longer period of time. I'm not clear when she went on oxygen but according to the family should be on oxygen for the last several weeks at 3 L. She desaturates without oxygen and can occasionally get confused at night without oxygen. She does feel dyspneic while lying in the  stretcher or in bed and even at rest. Family is concerned that this might be due to sarcoidosis  Has not walked at all since early Jan 2015  Lab test reviewed  Creatinine 1.9 mg percent 01/01/2014. GFR 27  CT chest 12/28/2013 IMPRESSION:  1. Apical predominant pulmonary fibrosis with superimposed airspace  disease. Airspace disease may represent acute phase interstitial  pneumonitis or pneumonia (multifocal pneumonia). Mediastinal  adenopathy may be reactive. Adenopathy also raises possibility  sarcoidosis accounting for alveolar opacities and adenopathy.  2. Small to moderate bilateral pleural effusions layering  dependently. Collapse/consolidation of both lower lobes associated  with the effusions.  3. Cardiomegaly and probable anemia.  Electronically Signed  By: Andreas NewportGeoffrey Lamke M.D.  On: 12/28/2013 01:08   Since discharge [facility]. According to the family for the last several weeks has been dyspneic. Past Medical History  Diagnosis Date  . Hypertension   . Gout   . Hypercholesteremia   . Diabetes mellitus   . CHF (congestive heart failure)   . Renal disorder   . Cancer   . Edema      Family History  Problem Relation Age of Onset  . Hypertension Mother   . Hypertension Father   . Hypertension Daughter      History   Social History  . Marital Status: Divorced    Spouse Name: N/A    Number of Children: N/A  . Years of Education: N/A   Occupational History  . Not on file.   Social History Main Topics  . Smoking status:  Former Smoker -- 0.50 packs/day for 20 years    Types: Cigarettes    Quit date: 12/17/1979  . Smokeless tobacco: Not on file  . Alcohol Use: No  . Drug Use: No  . Sexual Activity: Not on file   Other Topics Concern  . Not on file   Social History Narrative  . No narrative on file     Allergies  Allergen Reactions  . Motrin [Ibuprofen] Hives     Outpatient Prescriptions Prior to Visit   Medication Sig Dispense Refill  . acetaminophen (TYLENOL) 325 MG tablet Take 650 mg by mouth every 4 (four) hours as needed for fever. *takes if temperature over 99.5*      . amoxicillin (AMOXIL) 500 MG capsule Take 1 capsule (500 mg total) by mouth 2 (two) times daily.  60 capsule  2  . carvedilol (COREG) 25 MG tablet Take 1 tablet (25 mg total) by mouth 2 (two) times daily with a meal.      . doxycycline (VIBRA-TABS) 100 MG tablet Take 1 tablet (100 mg total) by mouth 2 (two) times daily.  60 tablet  2  . feeding supplement, ENSURE, (ENSURE) PUDG Take 1 Container by mouth 3 (three) times daily between meals.    0  . hydrALAZINE (APRESOLINE) 50 MG tablet Take 50 mg by mouth 4 (four) times daily.      Marland Kitchen HYDROcodone-acetaminophen (NORCO) 10-325 MG per tablet Take 1 tablet by mouth every 6 (six) hours as needed for moderate pain.  30 tablet  0  . ipratropium-albuterol (DUONEB) 0.5-2.5 (3) MG/3ML SOLN Take 3 mLs by nebulization every 2 (two) hours as needed (shortness of breath).      . isosorbide mononitrate (IMDUR) 15 mg TB24 24 hr tablet Take 0.5 tablets (15 mg total) by mouth daily.      Marland Kitchen lidocaine (LIDODERM) 5 % Place 1 patch onto the skin daily. Remove & Discard patch within 12 hours or as directed by MD      . Multiple Vitamins-Minerals (MULTIVITAMIN WITH MINERALS) tablet Take 1 tablet by mouth daily.      . polyethylene glycol (MIRALAX / GLYCOLAX) packet Take 17 g by mouth daily.      Marland Kitchen senna (SENOKOT) 8.6 MG TABS tablet Take 2 tablets by mouth daily.      . simvastatin (ZOCOR) 20 MG tablet Take 20 mg by mouth daily at 6 PM.      . vitamin C (ASCORBIC ACID) 500 MG tablet Take 500 mg by mouth daily.      . QUEtiapine (SEROQUEL) 50 MG tablet Take 1 tablet (50 mg total) by mouth at bedtime.      . furosemide (LASIX) 20 MG tablet Take 3 tablets (60 mg total) by mouth 2 (two) times daily.       No facility-administered medications prior to visit.       Review of Systems  Constitutional:  Negative for fever and unexpected weight change.  HENT: Negative for congestion, dental problem, ear pain, nosebleeds, postnasal drip, rhinorrhea, sinus pressure, sneezing, sore throat and trouble swallowing.   Eyes: Negative for redness and itching.  Respiratory: Positive for shortness of breath. Negative for cough, chest tightness and wheezing.   Cardiovascular: Negative for palpitations and leg swelling.  Gastrointestinal: Negative for nausea and vomiting.  Genitourinary: Negative for dysuria.  Musculoskeletal: Negative for joint swelling.  Skin: Negative for rash.  Neurological: Negative for headaches.  Hematological: Does not bruise/bleed easily.  Psychiatric/Behavioral: Negative for dysphoric mood. The  patient is not nervous/anxious.        Objective:   Physical Exam  Vitals reviewed. Constitutional: She is oriented to person, place, and time. She appears well-developed and well-nourished. No distress.  In stretcher  HENT:  Head: Normocephalic and atraumatic.  Right Ear: External ear normal.  Left Ear: External ear normal.  Mouth/Throat: Oropharynx is clear and moist. No oropharyngeal exudate.  Nasal cannula on  Eyes: Conjunctivae and EOM are normal. Pupils are equal, round, and reactive to light. Right eye exhibits no discharge. Left eye exhibits no discharge. No scleral icterus.  Neck: Normal range of motion. Neck supple. No JVD present. No tracheal deviation present. No thyromegaly present.  Cardiovascular: Normal rate, regular rhythm, normal heart sounds and intact distal pulses.  Exam reveals no gallop and no friction rub.   No murmur heard. Pulmonary/Chest: Effort normal and breath sounds normal. No respiratory distress. She has no wheezes. She has no rales. She exhibits no tenderness.  Could not examine poisteriorly  Abdominal: Soft. Bowel sounds are normal. She exhibits no distension and no mass. There is no tenderness. There is no rebound and no guarding.   Musculoskeletal: Normal range of motion. She exhibits no edema and no tenderness.  Lymphadenopathy:    She has no cervical adenopathy.  Neurological: She is alert and oriented to person, place, and time. She has normal reflexes. No cranial nerve deficit. She exhibits normal muscle tone. Coordination normal.  Moves her legs but family says cannot walk  Skin: Skin is warm and dry. No rash noted. She is not diaphoretic. No erythema. No pallor.  Reportedly bed sores in back healed  Psychiatric: She has a normal mood and affect. Her behavior is normal. Judgment and thought content normal.          Assessment & Plan:

## 2014-01-18 ENCOUNTER — Non-Acute Institutional Stay (SKILLED_NURSING_FACILITY): Payer: Medicare HMO | Admitting: Adult Health

## 2014-01-18 DIAGNOSIS — M549 Dorsalgia, unspecified: Secondary | ICD-10-CM

## 2014-01-18 DIAGNOSIS — J9620 Acute and chronic respiratory failure, unspecified whether with hypoxia or hypercapnia: Secondary | ICD-10-CM

## 2014-01-19 ENCOUNTER — Other Ambulatory Visit: Payer: Self-pay | Admitting: Internal Medicine

## 2014-01-19 ENCOUNTER — Ambulatory Visit (HOSPITAL_COMMUNITY)
Admission: RE | Admit: 2014-01-19 | Discharge: 2014-01-19 | Disposition: A | Discharge status: Home / Self Care | Payer: Medicare HMO | Source: Ambulatory Visit | Attending: Internal Medicine | Admitting: Internal Medicine

## 2014-01-19 DIAGNOSIS — I2699 Other pulmonary embolism without acute cor pulmonale: Secondary | ICD-10-CM

## 2014-01-19 DIAGNOSIS — I2510 Atherosclerotic heart disease of native coronary artery without angina pectoris: Secondary | ICD-10-CM | POA: Insufficient documentation

## 2014-01-19 DIAGNOSIS — R0602 Shortness of breath: Secondary | ICD-10-CM | POA: Insufficient documentation

## 2014-01-19 DIAGNOSIS — M519 Unspecified thoracic, thoracolumbar and lumbosacral intervertebral disc disorder: Secondary | ICD-10-CM | POA: Insufficient documentation

## 2014-01-19 DIAGNOSIS — R0600 Dyspnea, unspecified: Secondary | ICD-10-CM | POA: Insufficient documentation

## 2014-01-19 DIAGNOSIS — R5381 Other malaise: Secondary | ICD-10-CM | POA: Insufficient documentation

## 2014-01-19 DIAGNOSIS — F32A Depression, unspecified: Secondary | ICD-10-CM | POA: Insufficient documentation

## 2014-01-19 DIAGNOSIS — R599 Enlarged lymph nodes, unspecified: Secondary | ICD-10-CM | POA: Insufficient documentation

## 2014-01-19 DIAGNOSIS — M869 Osteomyelitis, unspecified: Secondary | ICD-10-CM | POA: Insufficient documentation

## 2014-01-19 DIAGNOSIS — F329 Major depressive disorder, single episode, unspecified: Secondary | ICD-10-CM | POA: Insufficient documentation

## 2014-01-19 MED ORDER — TECHNETIUM TC 99M MEBROFENIN IV KIT
5.0000 | PACK | Freq: Once | INTRAVENOUS | Status: AC | PRN
Start: 2014-01-19 — End: 2014-01-19

## 2014-01-19 MED ORDER — TECHNETIUM TO 99M ALBUMIN AGGREGATED
6.0000 | Freq: Once | INTRAVENOUS | Status: AC | PRN
  Administered 2014-01-19: 6 via INTRAVENOUS

## 2014-01-19 MED ORDER — TECHNETIUM TC 99M DIETHYLENETRIAME-PENTAACETIC ACID: 40.0000 | Freq: Once | INTRAVENOUS | Status: AC | PRN

## 2014-01-20 ENCOUNTER — Non-Acute Institutional Stay (SKILLED_NURSING_FACILITY): Payer: Medicare HMO | Admitting: Adult Health

## 2014-01-20 DIAGNOSIS — N184 Chronic kidney disease, stage 4 (severe): Secondary | ICD-10-CM

## 2014-01-20 DIAGNOSIS — I5032 Chronic diastolic (congestive) heart failure: Secondary | ICD-10-CM

## 2014-01-20 DIAGNOSIS — I119 Hypertensive heart disease without heart failure: Secondary | ICD-10-CM

## 2014-01-21 ENCOUNTER — Encounter: Payer: Self-pay | Admitting: Adult Health

## 2014-01-21 NOTE — Progress Notes (Signed)
Patient ID: [name] [name], female   DOB: [birth year], 80 y.o.   MRN: [id number]     [facility]  Allergies  Allergen Reactions  . Motrin [Ibuprofen] Hives     Chief Complaint  Patient presents with  . Acute Visit    follow up chest x-ray    HPI:  She has been seen by pulmonology; who is concerned about PE; CHF; her chest x-ray; did show some improvement. She will need a ct angio to look further. She states that she is feeling better; however; her lungs continue to have rhonchi and rales present. There are no signs of distress present.    Past Medical History  Diagnosis Date  . Hypertension   . Gout   . Hypercholesteremia   . Diabetes mellitus   . CHF (congestive heart failure)   . Renal disorder   . Cancer   . Edema     Past Surgical History  Procedure Laterality Date  . Cesarean section    . Rotator cuff repair    . Tonsillectomy    . Tubal ligation      VITAL SIGNS BP 146/52  Pulse 73  Ht 5' (1.524 m)  Wt 210 lb 1.6 oz (95.301 kg)  BMI 41.03 kg/m2   Patient's Medications  New Prescriptions   No medications on file  Previous Medications   ACETAMINOPHEN (TYLENOL) 325 MG TABLET    Take 650 mg by mouth every 4 (four) hours as needed for fever. *takes if temperature over 99.5*   AMOXICILLIN (AMOXIL) 500 MG CAPSULE    Take 1 capsule (500 mg total) by mouth 2 (two) times daily.   CARVEDILOL (COREG) 25 MG TABLET    Take 1 tablet (25 mg total) by mouth 2 (two) times daily with a meal.   DOXYCYCLINE (VIBRA-TABS) 100 MG TABLET    Take 1 tablet (100 mg total) by mouth 2 (two) times daily.   DULOXETINE (CYMBALTA) 60 MG CAPSULE    Take 60 mg by mouth daily.   FEEDING SUPPLEMENT, ENSURE, (ENSURE) PUDG    Take 1 Container by mouth 3 (three) times daily between meals.   HYDRALAZINE (APRESOLINE) 50 MG TABLET    Take 50 mg by mouth 4 (four) times daily.   HYDROCODONE-ACETAMINOPHEN (NORCO) 10-325 MG PER TABLET    Take 1 tablet by mouth every 6 (six) hours as needed for  moderate pain.   IPRATROPIUM-ALBUTEROL (DUONEB) 0.5-2.5 (3) MG/3ML SOLN    Take 3 mLs by nebulization every 2 (two) hours as needed (shortness of breath).   ISOSORBIDE MONONITRATE (IMDUR) 15 MG TB24 24 HR TABLET    Take 0.5 tablets (15 mg total) by mouth daily.   LIDOCAINE (LIDODERM) 5 %    Place 1 patch onto the skin daily. Remove & Discard patch within 12 hours or as directed by MD   MULTIPLE VITAMINS-MINERALS (MULTIVITAMIN WITH MINERALS) TABLET    Take 1 tablet by mouth daily.   POLYETHYLENE GLYCOL (MIRALAX / GLYCOLAX) PACKET    Take 17 g by mouth daily.   SENNA (SENOKOT) 8.6 MG TABS TABLET    Take 2 tablets by mouth daily.   SIMVASTATIN (ZOCOR) 20 MG TABLET    Take 20 mg by mouth daily at 6 PM.   TORSEMIDE (DEMADEX) 20 MG TABLET    Take 20 mg by mouth twice  daily.   VITAMIN C (ASCORBIC ACID) 500 MG TABLET    Take 500 mg by mouth daily.  Modified Medications   No  medications on file  Discontinued Medications   No medications on file    SIGNIFICANT DIAGNOSTIC EXAMS  11-20-13: thoracic spine x-ray: 1. Highly unusual appearance of T10 and T11, as above, which is concern for potential discitis/osteomyelitis. Given the patient's history of recent fall, these findings could simply be related to acute trauma, however, the marked irregularity of the endplates at this level raises concern for infection. Correlation with MRI of the thoracic spine with and without IV gadolinium is recommended. 2. The appearance of the lung parenchyma suggests an underlying interstitial lung disease. This could be better evaluated with followup nonemergent high-resolution chest CT if clinicallyappropriate.  11-20-13: lumbar spine x-ray: 1. The appearance of T10 and T11 remains concerning for potential discitis/osteomyelitis, and correlation with MRI of the thoracic spine with and without IV gadolinium is strongly recommended.2. No acute radiographic abnormality of the lumbar spine itself.  11-20-13: mri lumbar spine:  Abnormal destructive changes of the T10-11 disc,  No MR findings of discitis osteomyelitis in the lumbar spine. Degenerative change of the lumbar spine: Moderate canal stenosis at L3-4, mild at L4-5. Neural foraminal narrowing L2-3 to L5-S1: Severe on the right at L5-S1. Mildly widened facets at L3-4, which can be associated with dynamic instability and could be further characterize with flexion and extension radiographs if clinically indicated.  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 Moderate canal stenosis at T10-11 with severe neural foraminal narrowing. Abnormal signal within T11 disc concerning for early discitis without osteomyelitis. Degenerative change at this level resulting in mild canal stenosis and moderate to severe right, moderate left neural foraminal narrowing. Grade 1 paraspinal muscle strain T10-11  12-10-13: kub: mild ileus no obstruction. Moderate diffuse retained colorectal stool no abnormal calcifications seen   12-16-13: chest x-ray: Progressive accentuation of interstitial markings bilaterally which may represent pulmonary edema or pneumonitis.  12-17-13: 2-d echo: Left ventricle: The cavity size was normal. Wall thickness was increased in a pattern of moderate LVH. Systolic function was normal. The estimated ejection fraction was in the range of 60% to 65%. Wall motion was normal; there were no regional wall motion abnormalities. - Left atrium: The atrium was mildly dilated.- Pulmonary arteries: Systolic pressure was mildly increased.  12-18-13: chest x-ray: 1. The lungs are better inflated today. There remain bilateral interstitial and alveolar infiltrates. This coupled with the prominence of the cardiac silhouette and pulmonary vascularity suggests congestive heart failure. Certainly pneumonia cannot be excluded in the  appropriate clinical setting. 2. A followup PA and lateral chest x-ray would be useful when the patient can tolerate the procedure.    12-18-13: bilateral lower extremity doppler: - No evidence of deep vein thrombosis involving the right   lower extremity.- No evidence of deep vein thrombosis involving the left lower extremity.  12-20-13: chest x-ray: 1. Right greater than left patchy alveolar interstitial densities, unchanged from the prior study. 2. New right PICC, well positioned.  No other change.   12-27-13: chest x-ray: No change from the prior exam.  12-28-13: ct of chest: 1. Apical predominant pulmonary fibrosis with superimposed airspace disease. Airspace disease may represent acute phase interstitial pneumonitis or pneumonia (multifocal pneumonia). Mediastinal adenopathy may be reactive. Adenopathy also raises possibility sarcoidosis accounting for alveolar opacities and adenopathy. 2. Small to moderate bilateral pleural effusions layering dependently. Collapse/consolidation of both lower lobes associated with the effusions. 3. Cardiomegaly and probable anemia.  01-15-14: chest x-ray: improving chest from 01-07-14; with findings consistent with resolving chf. The Insterstitial changes noted previously are much less prominent    LABS REVIEWED:   11-20-13: wbc 8.7; hgb 12.1; hct 37.3; mcv 86.1plt 202; glucose 125; bun 32; creat 1.85; k+3.9; na++144; ca++ 10.8; sed rate 27; BNP 894.4; hgb a1c 6.8; tsh 1.225 11-25-13: wbc 7.6; hgb 10.8; hct 34.1; mcv 86.3;plt 178; glucose 125; bun 20; creat 1.48; k+4.1; na++140; ca++ 10.2 BNP 1142  12-10-13: bun 15; creat 1.2 12-16-13: wbc 8.2; hgb 9.8 hct 30.2; mcv 83.0; plt 199; glucose 154; bun 39; creat 1.69 ;k+4.5;na++133 BNP: 11565.0; D-Dimer: 1.61; urine culture: no growth; influenza: negative.  12-27-13: wbc 8.0; hgb 9.4; hct 30.1; mcv 84.6; plt 231 glucose 182; bun 27; creat 1.42; k+5.5; na++137; ca++10.9;  BNP 7200 12-28-13: wbc 7.0; hgb 8.2; hct 25.8; mcv  84.;9 plt 231; glucose 126; bun 28; creat 1.70; k+5.0; na++137 ca++10.6; tsh 1.291; ACE 53; vit d 26; PTH 176.8 01-01-14: 158; bun 28; creat 1.93; k+4.3; na++ 139; ca++ 10.7     Review of Systems  Constitutional: Positive for malaise/fatigue.  Respiratory: Positive for shortness of breath. Negative for cough.        Is using 02  Cardiovascular: Negative for chest pain and palpitations.  Gastrointestinal: Negative for abdominal pain and constipation.  Musculoskeletal: negative for back pain. Negative for myalgias.   Skin: Negative.   Neurological: Positive for weakness.  Psychiatric/Behavioral: The patient is not nervous/anxious.      Physical Exam  Constitutional: She appears well-developed and well-nourished. No distress.  obese  Neck: Neck supple. No JVD present.  Cardiovascular: Normal rate, regular rhythm and intact distal pulses.   Respiratory: Effort normal. No respiratory distress. She has no wheezes.  02 dependent; breath sound rhonchi and rales scattered throughout   GI: Soft. Bowel sounds are normal. She exhibits no distension. There is no tenderness.  Musculoskeletal: She exhibits no edema.  Is able to move all extremities; has significant weakness present in all extremities.   Neurological: She is alert.  Skin: Skin is warm and dry. She is not diaphoretic.       ASSESSMENT/ PLAN:  1. Chronic respiratory failure: will setup a ct angio of the chest with contrast to rule out PE per pulmonology recommendation and will monitor her status.   2. Back pain: she is well managed at this time; will continue her cymbalta 60 mg daily which she is also taking for depression; vicodin 10/325 mg every 6 hours as needed; lidoderm patch to her back daily and will monitor her status.   Time spent with patient 30 minutes.    Ok Edwards NP [facility]  Contact [phone] Monday through Friday 8am- 5pm  After hours call [phone]

## 2014-01-27 MED ORDER — TORSEMIDE 20 MG PO TABS: 20.0000 mg | ORAL_TABLET | Freq: Every day | ORAL | Status: DC

## 2014-01-27 NOTE — Progress Notes (Signed)
Patient ID: [name] [name], female   DOB: 25-Feb-1930, 80 y.o.   MRN: [id number]     [facility]  Allergies  Allergen Reactions  . Motrin [Ibuprofen] Hives     Chief Complaint  Patient presents with  . Acute Visit    follow up test results.     HPI:  Her weight is stable. Her renal function is slightly worse since starting on the demadex; this medication dose will need to be adjusted. Her blood pressure is slightly elevated as well. I have spoken with Salma regarding her ct and VQ results. I told her that I was going to setup a family care plan meeting and she thought that would be good idea in order for [place] to discuss her overall status.    Past Medical History  Diagnosis Date  . Hypertension   . Gout   . Hypercholesteremia   . Diabetes mellitus   . CHF (congestive heart failure)   . Renal disorder   . Cancer   . Edema     Past Surgical History  Procedure Laterality Date  . Cesarean section    . Rotator cuff repair    . Tonsillectomy    . Tubal ligation      VITAL SIGNS BP 144/67  Pulse 68  Ht 5' (1.524 m)  Wt 205 lb 12.8 oz (93.35 kg)  BMI 40.19 kg/m2  SpO2 95%   Patient's Medications  New Prescriptions   No medications on file  Previous Medications   ACETAMINOPHEN (TYLENOL) 325 MG TABLET    Take 650 mg by mouth every 4 (four) hours as needed for fever. *takes if temperature over 99.5*   AMOXICILLIN (AMOXIL) 500 MG CAPSULE    Take 1 capsule (500 mg total) by mouth 2 (two) times daily.   CARVEDILOL (COREG) 25 MG TABLET    Take 1 tablet (25 mg total) by mouth 2 (two) times daily with a meal.   DOXYCYCLINE (VIBRA-TABS) 100 MG TABLET    Take 1 tablet (100 mg total) by mouth 2 (two) times daily.   DULOXETINE (CYMBALTA) 60 MG CAPSULE    Take 60 mg by mouth daily.   FEEDING SUPPLEMENT, ENSURE, (ENSURE) PUDG    Take 1 Container by mouth 3 (three) times daily between meals.   HYDRALAZINE (APRESOLINE) 50 MG TABLET    Take 50 mg by mouth 4 (four) times daily.   HYDROCODONE-ACETAMINOPHEN (NORCO) 10-325 MG PER TABLET    Take 1 tablet by mouth every 6 (six) hours as needed for moderate pain.   IPRATROPIUM-ALBUTEROL (DUONEB) 0.5-2.5 (3) MG/3ML SOLN    Take 3 mLs by nebulization every 2 (two) hours as needed (shortness of breath).   ISOSORBIDE MONONITRATE (IMDUR) 15 MG TB24 24 HR TABLET    Take 0.5 tablets (15 mg total) by mouth daily.   LIDOCAINE (LIDODERM) 5 %    Place 1 patch onto the skin daily. Remove & Discard patch within 12 hours or as directed by MD   MULTIPLE VITAMINS-MINERALS (MULTIVITAMIN WITH MINERALS) TABLET    Take 1 tablet by mouth daily.   POLYETHYLENE GLYCOL (MIRALAX / GLYCOLAX) PACKET    Take 17 g by mouth daily.   SENNA (SENOKOT) 8.6 MG TABS TABLET    Take 2 tablets by mouth daily.   SIMVASTATIN (ZOCOR) 20 MG TABLET    Take 20 mg by mouth daily at 6 PM.   TORSEMIDE (DEMADEX) 20 MG TABLET    Take 20 mg by mouth twice daily.  VITAMIN C (ASCORBIC ACID) 500 MG TABLET    Take 500 mg by mouth daily.  Modified Medications   No medications on file  Discontinued Medications   No medications on file    SIGNIFICANT DIAGNOSTIC EXAMS  11-20-13: thoracic spine x-ray: 1. Highly unusual appearance of T10 and T11, as above, which is concern for potential discitis/osteomyelitis. Given the patient's history of recent fall, these findings could simply be related to acute trauma, however, the marked irregularity of the endplates at this level raises concern for infection. Correlation with MRI of the thoracic spine with and without IV gadolinium is recommended. 2. The appearance of the lung parenchyma suggests an underlying interstitial lung disease. This could be better evaluated with followup nonemergent high-resolution chest CT if clinicallyappropriate.  11-20-13: mri lumbar spine: Abnormal destructive changes of the T10-11 disc,  No MR findings of discitis osteomyelitis in the lumbar spine. Degenerative change of the lumbar spine: Moderate canal stenosis  at L3-4, mild at L4-5. Neural foraminal narrowing L2-3 to L5-S1: Severe on the right at L5-S1. Mildly widened facets at L3-4, which can be associated with dynamic instability and could be further characterize with flexion and extension radiographs if clinically indicated.  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 Moderate canal stenosis at T10-11 with severe neural foraminal narrowing. Abnormal signal within T11 disc concerning for early discitis without osteomyelitis. Degenerative change at this level resulting in mild canal stenosis and moderate to severe right, moderate left neural foraminal narrowing. Grade 1 paraspinal muscle strain T10-11  12-10-13: kub: mild ileus no obstruction. Moderate diffuse retained colorectal stool no abnormal calcifications seen   12-16-13: chest x-ray: Progressive accentuation of interstitial markings bilaterally which may represent pulmonary edema or pneumonitis.  12-17-13: 2-d echo: Left ventricle: The cavity size was normal. Wall thickness was increased in a pattern of moderate LVH. Systolic function was normal. The estimated ejection fraction was in the range of 60% to 65%. Wall motion was normal; there were no regional wall motion abnormalities. - Left atrium: The atrium was mildly dilated.- Pulmonary arteries: Systolic pressure was mildly increased.  12-18-13: chest x-ray: 1. The lungs are better inflated today. There remain bilateral interstitial and alveolar infiltrates. This coupled with the prominence of the cardiac silhouette and pulmonary vascularity suggests congestive heart failure. Certainly pneumonia cannot be excluded in the appropriate clinical setting. 2. A followup PA and lateral chest x-ray would be useful when the patient can tolerate the procedure.    12-18-13: bilateral lower extremity  doppler: - No evidence of deep vein thrombosis involving the right   lower extremity.- No evidence of deep vein thrombosis involving the left lower extremity.  12-20-13: chest x-ray: 1. Right greater than left patchy alveolar interstitial densities, unchanged from the prior study. 2. New right PICC, well positioned.  No other change.   12-27-13: chest x-ray: No change from the prior exam.  12-28-13: ct of chest: 1. Apical predominant pulmonary fibrosis with superimposed airspace disease. Airspace disease may represent acute phase interstitial pneumonitis or pneumonia (multifocal pneumonia). Mediastinal adenopathy may be reactive. Adenopathy also raises possibility sarcoidosis accounting for alveolar opacities and adenopathy. 2. Small to moderate bilateral pleural effusions layering dependently. Collapse/consolidation of both lower lobes associated with the effusions 3. Cardiomegaly and probable anemia.   01-15-14: chest x-ray: improving chest from 01-07-14; with findings consistent with resolving chf. The Insterstitial changes noted previously are  much less prominent    01-19-14: ct of chest: 1. Compared to the prior study, some of the patchy airspace consolidation has largely resolved, although there continue to be patchy areas of fibrosis asymmetrically distributed throughout the lungs bilaterally, with an appearance which may suggest evolving cryptogenic organizing pneumonia (COP). Borderline enlarged and mildly enlarged mediastinal and hilar lymph nodes are presumably chronic and reactive. Followup high-resolution chest CT in 1 year would be useful to assess for temporal changes in the appearance of the lung parenchyma. 2. Chronic changes of discitis osteomyelitis at T10-T11, similar to the prior study. 3. Interval resolution of previously noted bilateral pleural effusions. 4.  Atherosclerosis, including three-vessel coronary artery disease.  01-19-14: VQ scan: No significant ventilation/ perfusion mismatch.  This study constitutes an overall low probability of pulmonary embolus.      LABS REVIEWED:   11-20-13: wbc 8.7; hgb 12.1; hct 37.3; mcv 86.1plt 202; glucose 125; bun 32; creat 1.85; k+3.9; na++144; ca++ 10.8; sed rate 27; BNP 894.4; hgb a1c 6.8; tsh 1.225 11-25-13: wbc 7.6; hgb 10.8; hct 34.1; mcv 86.3;plt 178; glucose 125; bun 20; creat 1.48; k+4.1; na++140; ca++ 10.2 BNP 1142  12-10-13: bun 15; creat 1.2 12-16-13: wbc 8.2; hgb 9.8 hct 30.2; mcv 83.0; plt 199; glucose 154; bun 39; creat 1.69 ;k+4.5;na++133 BNP: 11565.0; D-Dimer: 1.61; urine culture: no growth; influenza: negative.  12-27-13: wbc 8.0; hgb 9.4; hct 30.1; mcv 84.6; plt 231 glucose 182; bun 27; creat 1.42; k+5.5; na++137; ca++10.9;  BNP 7200 12-28-13: wbc 7.0; hgb 8.2; hct 25.8; mcv 84.;9 plt 231; glucose 126; bun 28; creat 1.70; k+5.0; na++137 ca++10.6; tsh 1.291; ACE 53; vit d 26; PTH 176.8 01-01-14: 158; bun 28; creat 1.93; k+4.3; na++ 139; ca++ 10.7 01-18-14: glucose 106; bun 39; creat 2.0; k+3.8; na++ 135; C02 38; ca++ 10.7      Review of Systems  Constitutional:negative  Respiratory: negative for shortness of breath Negative for cough.        Is using 02  Cardiovascular: Negative for chest pain and palpitations.  Gastrointestinal: Negative for abdominal pain and constipation.  Musculoskeletal: negative for back pain. Negative for myalgias.   Skin: Negative.   Neurological: Positive for weakness.  Psychiatric/Behavioral: The patient is not nervous/anxious.      Physical Exam  Constitutional: She appears well-developed and well-nourished. No distress.  obese  Neck: Neck supple. No JVD present.  Cardiovascular: Normal rate, regular rhythm and intact distal pulses.   Respiratory: Effort normal. No respiratory distress. She has no wheezes.  02 dependent; breath sound rhonchi and rales scattered throughout   GI: Soft. Bowel sounds are normal. She exhibits no distension. There is no tenderness.  Musculoskeletal: She exhibits  no edema.  Is able to move all extremities; has significant weakness present in all extremities.   Neurological: She is alert.  Skin: Skin is warm and dry. She is not diaphoretic.       ASSESSMENT/ PLAN:  1. Hypertension is worse; will continue apresoline 50 mg four times daily; coreg 12.5 mg twice daily; and will add clonidine 0.1 mg twice daily and will monitor her status.    2. Chronic chf: she is stable; will lower her demadex to 20 mg daily and will check bmp in 2 weeks. Will continue daily weights and will monitor her status. Will setup a care plan meeting to make sure her family is up to date on her status.   3. CKD stage IV: her bun and creat are worse; will need to lower  her demadex to one time daily and will recheck her bmp in 2 weeks.      Ok Edwards NP [facility]  Contact [phone] Monday through Friday 8am- 5pm  After hours call [phone]

## 2014-01-29 ENCOUNTER — Non-Acute Institutional Stay (SKILLED_NURSING_FACILITY): Payer: Medicare HMO | Admitting: Adult Health

## 2014-01-29 DIAGNOSIS — I10 Essential (primary) hypertension: Secondary | ICD-10-CM

## 2014-01-29 DIAGNOSIS — M4624 Osteomyelitis of vertebra, thoracic region: Secondary | ICD-10-CM

## 2014-01-29 DIAGNOSIS — I5032 Chronic diastolic (congestive) heart failure: Secondary | ICD-10-CM

## 2014-01-29 DIAGNOSIS — M869 Osteomyelitis, unspecified: Secondary | ICD-10-CM

## 2014-01-29 DIAGNOSIS — F32A Depression, unspecified: Secondary | ICD-10-CM

## 2014-01-29 DIAGNOSIS — M549 Dorsalgia, unspecified: Secondary | ICD-10-CM

## 2014-01-29 DIAGNOSIS — K5900 Constipation, unspecified: Secondary | ICD-10-CM

## 2014-01-29 DIAGNOSIS — F329 Major depressive disorder, single episode, unspecified: Secondary | ICD-10-CM

## 2014-01-29 DIAGNOSIS — J8410 Pulmonary fibrosis, unspecified: Secondary | ICD-10-CM

## 2014-01-29 DIAGNOSIS — E785 Hyperlipidemia, unspecified: Secondary | ICD-10-CM

## 2014-01-29 DIAGNOSIS — F3289 Other specified depressive episodes: Secondary | ICD-10-CM

## 2014-02-01 ENCOUNTER — Encounter: Payer: Self-pay | Admitting: Adult Health

## 2014-02-01 NOTE — Progress Notes (Signed)
Patient ID: [name] [name], female   DOB: 1930-05-22, 80 y.o.   MRN: [id number]     [facility]  Allergies  Allergen Reactions  . Motrin [Ibuprofen] Hives     Chief Complaint  Patient presents with  . Medical Managment of Chronic Issues    HPI:  She is being seen for the management of her chronic illnesses. Overall her status has not significantly changed in the recent past. She does get out of bed with her grandson at times. She is not complaining of pain present. Her appetite remains poor; she continues on abt for her osteomyelitis. There are no concerns being voiced by the nursing staff at this time.    Past Medical History  Diagnosis Date  . Hypertension   . Gout   . Hypercholesteremia   . Diabetes mellitus   . CHF (congestive heart failure)   . Renal disorder   . Cancer   . Edema     Past Surgical History  Procedure Laterality Date  . Cesarean section    . Rotator cuff repair    . Tonsillectomy    . Tubal ligation      VITAL SIGNS BP 116/68  Pulse 68  Ht 5\' 3"  (1.6 m)  Wt 201 lb 8 oz (91.4 kg)  BMI 35.70 kg/m2   Patient's Medications  New Prescriptions   No medications on file  Previous Medications   ACETAMINOPHEN (TYLENOL) 325 MG TABLET    Take 650 mg by mouth every 4 (four) hours as needed for fever. *takes if temperature over 99.5*   AMOXICILLIN (AMOXIL) 500 MG CAPSULE    Take 1 capsule (500 mg total) by mouth 2 (two) times daily.   clonidine 0.1 mg twice daily    CARVEDILOL (COREG) 25 MG TABLET    Take 1 tablet (25 mg total) by mouth 2 (two) times daily with a meal.   DOXYCYCLINE (VIBRA-TABS) 100 MG TABLET    Take 1 tablet (100 mg total) by mouth 2 (two) times daily.   DULOXETINE (CYMBALTA) 60 MG CAPSULE    Take 60 mg by mouth daily.   FEEDING SUPPLEMENT, ENSURE, (ENSURE) PUDG    Take 1 Container by mouth 3 (three) times daily between meals.   HYDRALAZINE (APRESOLINE) 50 MG TABLET    Take 50 mg by mouth 4 (four) times daily.   HYDROCODONE-ACETAMINOPHEN (NORCO) 10-325 MG PER TABLET    Take 1 tablet by mouth every 6 (six) hours as needed for moderate pain.   IPRATROPIUM-ALBUTEROL (DUONEB) 0.5-2.5 (3) MG/3ML SOLN    Take 3 mLs by nebulization every 2 (two) hours as needed (shortness of breath).   ISOSORBIDE MONONITRATE (IMDUR) 15 MG TB24 24 HR TABLET    Take 0.5 tablets (15 mg total) by mouth daily.   LIDOCAINE (LIDODERM) 5 %    Place 1 patch onto the skin daily. Remove & Discard patch within 12 hours or as directed by MD   MULTIPLE VITAMINS-MINERALS (MULTIVITAMIN WITH MINERALS) TABLET    Take 1 tablet by mouth daily.   POLYETHYLENE GLYCOL (MIRALAX / GLYCOLAX) PACKET    Take 17 g by mouth daily as needed.    SENNA (SENOKOT) 8.6 MG TABS TABLET    Take 2 tablets by mouth daily as needed.    SIMVASTATIN (ZOCOR) 20 MG TABLET    Take 20 mg by mouth daily at 6 PM.   TORSEMIDE (DEMADEX) 20 MG TABLET    Take 1 tablet (20 mg total) by mouth daily.  VITAMIN C (ASCORBIC ACID) 500 MG TABLET    Take 500 mg by mouth daily.  Modified Medications   No medications on file  Discontinued Medications   No medications on file    SIGNIFICANT DIAGNOSTIC EXAMS   11-20-13: thoracic spine x-ray: 1. Highly unusual appearance of T10 and T11, as above, which is concern for potential discitis/osteomyelitis. Given the patient's history of recent fall, these findings could simply be related to acute trauma, however, the marked irregularity of the endplates at this level raises concern for infection. Correlation with MRI of the thoracic spine with and without IV gadolinium is recommended. 2. The appearance of the lung parenchyma suggests an underlying interstitial lung disease. This could be better evaluated with followup nonemergent high-resolution chest CT if clinicallyappropriate.  11-20-13: mri lumbar spine: Abnormal destructive changes of the T10-11 disc,  No MR findings of discitis osteomyelitis in the lumbar spine. Degenerative change of the  lumbar spine: Moderate canal stenosis at L3-4, mild at L4-5. Neural foraminal narrowing L2-3 to L5-S1: Severe on the right at L5-S1. Mildly widened facets at L3-4, which can be associated with dynamic instability and could be further characterize with flexion and extension radiographs if clinically indicated.  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 Moderate canal stenosis at T10-11 with severe neural foraminal narrowing. Abnormal signal within T11 disc concerning for early discitis without osteomyelitis. Degenerative change at this level resulting in mild canal stenosis and moderate to severe right, moderate left neural foraminal narrowing. Grade 1 paraspinal muscle strain T10-11   12-17-13: 2-d echo: Left ventricle: The cavity size was normal. Wall thickness was increased in a pattern of moderate LVH. Systolic function was normal. The estimated ejection fraction was in the range of 60% to 65%. Wall motion was normal; there were no regional wall motion abnormalities. - Left atrium: The atrium was mildly dilated.- Pulmonary arteries: Systolic pressure was mildly increased.  12-18-13: bilateral lower extremity doppler: - No evidence of deep vein thrombosis involving the right   lower extremity.- No evidence of deep vein thrombosis involving the left lower extremity.  12-28-13: ct of chest: 1. Apical predominant pulmonary fibrosis with superimposed airspace disease. Airspace disease may represent acute phase interstitial pneumonitis or pneumonia (multifocal pneumonia). Mediastinal adenopathy may be reactive. Adenopathy also raises possibility sarcoidosis accounting for alveolar opacities and adenopathy. 2. Small to moderate bilateral pleural effusions layering dependently. Collapse/consolidation of both lower lobes associated with the effusions 3.  Cardiomegaly and probable anemia.  01-19-14: ct of chest: 1. Compared to the prior study, some of the patchy airspace consolidation has largely resolved, although there continue to be patchy areas of fibrosis asymmetrically distributed throughout the lungs bilaterally, with an appearance which may suggest evolving cryptogenic organizing pneumonia (COP). Borderline enlarged and mildly enlarged mediastinal and hilar lymph nodes are presumably chronic and reactive. Followup high-resolution chest CT in 1 year would be useful to assess for temporal changes in the appearance of the lung parenchyma. 2. Chronic changes of discitis osteomyelitis at T10-T11, similar to the prior study. 3. Interval resolution of previously noted bilateral pleural effusions. 4.  Atherosclerosis, including three-vessel coronary artery disease.  01-19-14: VQ scan: No significant ventilation/ perfusion mismatch. This study constitutes an overall low probability of pulmonary embolus.      LABS REVIEWED:   11-20-13: wbc 8.7; hgb 12.1; hct 37.3; mcv 86.1plt 202; glucose 125; bun 32; creat  1.85; k+3.9; na++144; ca++ 10.8; sed rate 27; BNP 894.4; hgb a1c 6.8; tsh 1.225 11-25-13: wbc 7.6; hgb 10.8; hct 34.1; mcv 86.3;plt 178; glucose 125; bun 20; creat 1.48; k+4.1; na++140; ca++ 10.2 BNP 1142  12-10-13: bun 15; creat 1.2 12-16-13: wbc 8.2; hgb 9.8 hct 30.2; mcv 83.0; plt 199; glucose 154; bun 39; creat 1.69 ;k+4.5;na++133 BNP: 11565.0; D-Dimer: 1.61; urine culture: no growth; influenza: negative.  12-27-13: wbc 8.0; hgb 9.4; hct 30.1; mcv 84.6; plt 231 glucose 182; bun 27; creat 1.42; k+5.5; na++137; ca++10.9;  BNP 7200 12-28-13: wbc 7.0; hgb 8.2; hct 25.8; mcv 84.;9 plt 231; glucose 126; bun 28; creat 1.70; k+5.0; na++137 ca++10.6; tsh 1.291; ACE 53; vit d 26; PTH 176.8 01-01-14: 158; bun 28; creat 1.93; k+4.3; na++ 139; ca++ 10.7 01-18-14: glucose 106; bun 39; creat 2.0; k+3.8; na++ 135; C02 38; ca++ 10.7   03-21-14: glucose 100; bun 46; creat 1.9;  k+3.5; na++ 133       Review of Systems  Constitutional:negative  Respiratory: negative for shortness of breath Negative for cough.        Is using 02  Cardiovascular: Negative for chest pain and palpitations.  Gastrointestinal: Negative for abdominal pain and constipation.  Musculoskeletal: negative for back pain. Negative for myalgias.   Skin: Negative.   Neurological: Positive for weakness.  Psychiatric/Behavioral: The patient is not nervous/anxious.      Physical Exam  Constitutional: She appears well-developed and well-nourished. No distress.  obese  Neck: Neck supple. No JVD present.  Cardiovascular: Normal rate, regular rhythm and intact distal pulses.   Respiratory: Effort normal. No respiratory distress. She has no wheezes.  02 dependent; breath sounds diminished GI: Soft. Bowel sounds are normal. She exhibits no distension. There is no tenderness.  Musculoskeletal: She exhibits no edema.  Is able to move all extremities; has weakness present in all extremities.   Neurological: She is alert.  Skin: Skin is warm and dry. She is not diaphoretic.     ASSESSMENT/ PLAN:  1. Hypertension; is stable: will continue clonidine 0.1 mg twice daily; coreg 25 mg twice daily; apresoline 50 mg four times daily and will monitor his status  2. Chronic diastolic heart failure: is stable will continue demadex 20 mg daily and imdur 15 mg daily   3. Chronic back pain: her back is presently being managed with the current regimen: lidoderm patch; cymbalta 60 mg daily; and vicodin 10/325 mg every 6 hours as needed   4. Depression: she is emotionally doing well; will continue cymbalta 60 mg daily and will monitor her status   5. Thoracic spine osteomyelitis: she is presently stable; will continue her doxycycline 100 mg twice daily and amoxil 500 mg twice daily will continue to monitor her status   6. Dyslipidemia: will continue zocor 20 mg daily  7. Pulmonary fibrosis: she is presently  stable: will continue duoneb every 2 hours as needed; she is due to pulmonology in the near future for cpap mask fitting. Will continue to monitor her status.       Ok Edwards NP [facility]  Contact [phone] Monday through Friday 8am- 5pm  After hours call [phone]

## 2014-02-02 ENCOUNTER — Encounter: Payer: Self-pay | Admitting: Adult Health

## 2014-02-02 DIAGNOSIS — J8410 Pulmonary fibrosis, unspecified: Secondary | ICD-10-CM | POA: Insufficient documentation

## 2014-02-03 ENCOUNTER — Encounter: Payer: Self-pay | Admitting: Pulmonary Disease

## 2014-02-03 ENCOUNTER — Ambulatory Visit (INDEPENDENT_AMBULATORY_CARE_PROVIDER_SITE_OTHER): Payer: Medicare HMO | Admitting: Pulmonary Disease

## 2014-02-03 VITALS — BP 122/54 | HR 66 | Temp 98.3°F

## 2014-02-03 DIAGNOSIS — G4733 Obstructive sleep apnea (adult) (pediatric): Secondary | ICD-10-CM

## 2014-02-03 NOTE — Progress Notes (Signed)
Subjective:    Patient ID: [name] [name], female    DOB: Jan 11, 1930, 80 y.o.   MRN: [id number]  HPI The patient is an 80 year old female who I've been asked to see for management of obstructive sleep apnea. She tells me that she was diagnosed with sleep apnea approximately one year ago at the [facility] in [place], and was started on CPAP. She did not on the device comfortable, did not like it, and would rather not use it. She currently is on oxygen during sleep, and feels that she sleeps fairly well. She does not like the nurses checking on her during the night which causes awakenings. She has been noted to have some snoring, but is unsure if anyone has witnessed apneas. She feels rested in the mornings upon arising, and is satisfied with her alertness during the day. Her grandson is with her today, and feels that she gets sleepy only if it is a very quiet environment. He thinks that she only gets sleepy when she is very tired. The patient states that her weight is down 40 pounds over the last 6 months, and her Epworth score today is 13.   Questionnaire What time do you typically go to bed?( Between what hours) 10-12 10-12 at 1100 on 02/03/14 by Virl Cagey, CMA How long does it take you to fall asleep? within minutes within minutes at 1100 on 02/03/14 by Virl Cagey, CMA How many times during the night do you wake up? No Value multiple d/t nurse checking in on pt at 1100 on 02/03/14 by Virl Cagey, CMA What time do you get out of bed to start your day? No Value btw 5-6a at 1100 on 02/03/14 by Virl Cagey, CMA Do you drive or operate heavy machinery in your occupation? No No at 1100 on 02/03/14 by Virl Cagey, CMA How much has your weight changed (up or down) over the past two years? (In pounds) 40 lb (18.144 kg) 40 lb (18.144 kg) at 1100 on 02/03/14 by Virl Cagey, CMA Have you ever had a sleep study before? Yes Yes at 1100 on 02/03/14 by Virl Cagey, CMA If yes, location of study? [place] at 1100 on 02/03/14 by Virl Cagey, CMA If yes, date of study? unsure unsure at 1100 on 02/03/14 by Virl Cagey, CMA Do you currently use CPAP? No No at 1100 on 02/03/14 by Virl Cagey, CMA Do you wear oxygen at any time? Yes Yes at 1100 on 02/03/14 by Virl Cagey, CMA O2 Flow Rate (L/min) 2 L/min 2 L/min at 1554 on 01/15/14 by Lilli Few, CMA 2 L/min2 L/min 24/7 at 1100 on 02/03/14 by Virl Cagey, CMA   Review of Systems  Constitutional: Negative for fever and unexpected weight change.  HENT: Negative for congestion, dental problem, ear pain, nosebleeds, postnasal drip, rhinorrhea, sinus pressure, sneezing, sore throat and trouble swallowing.   Eyes: Negative for redness and itching.  Respiratory: Negative for cough, chest tightness, shortness of breath and wheezing.   Cardiovascular: Negative for palpitations and leg swelling.  Gastrointestinal: Positive for nausea and vomiting.  Genitourinary: Negative for dysuria.  Musculoskeletal: Negative for joint swelling.  Skin: Negative for rash.  Neurological: Positive for headaches ( once in a whille).  Hematological: Does not bruise/bleed easily.  Psychiatric/Behavioral: Negative for dysphoric mood. The patient is not nervous/anxious.        Objective:   Physical Exam Constitutional:  Overweight  female, no acute distress  HENT:  Nares patent without discharge  Oropharynx without exudate, palate and uvula are moderately elongated.   Eyes:  Pupils slightly irregular, eomi, no scleral icterus  Neck:  No JVD, no TMG  Cardiovascular:  Normal rate, regular rhythm, no rubs or gallops.  No murmurs        Intact distal pulses but diminished.   Pulmonary :  Normal breath sounds, no stridor or respiratory distress   No rales, rhonchi, or wheezing  Abdominal:  Soft, nondistended, bowel sounds present.  No tenderness noted.   Musculoskeletal:  + lower  extremity edema noted.  Lymph Nodes:  No cervical lymphadenopathy noted  Skin:  No cyanosis noted  Neurologic:  Alert, appropriate, moves all 4 extremities without obvious deficit.         Assessment & Plan:

## 2014-02-03 NOTE — Patient Instructions (Signed)
Will get your recent sleep study from the [facility].  If you had mild to moderate disease, and you are satisfied with how you sleep and feel the next day, I think it is ok to stay on just oxygen at night.  If you had severe sleep apnea on your sleep study, I would like to get you back on cpap if possible. If you are able to lose more weight, your sleep apnea will resolve.  However, I do not want you to do so by not eating.  Will contact you once I get your sleep study results faxed over.

## 2014-02-03 NOTE — Assessment & Plan Note (Signed)
The patient has a history of obstructive sleep apnea, but her current study is not available for my review. She was tried on CPAP, and really did not like to use the device. She is only willing to try CPAP again if we feel strongly that it is absolutely necessary. Currently, the patient is wearing nocturnal oxygen, and feels that she sleeps fairly well and is satisfied with her daytime alertness. If we find that her sleep apnea was only mild to moderate in nature, I would consider not treating her with CPAP and just using nocturnal oxygen as long as she was not overly symptomatic. However, if she was found to have severe sleep apnea on her study last year, I would lean toward trying CPAP again. However, we must keep in mind that she has lost 40 pounds over the last 6 months, and that her sleep apnea is probably much better now than it was at the time of her sleep study. I have encouraged her to continue working on weight, but not at the expense of anorexia. I will try and get her sleep study from [place], and then make a decision about cpap.

## 2014-02-04 ENCOUNTER — Non-Acute Institutional Stay (SKILLED_NURSING_FACILITY): Payer: Medicare HMO | Admitting: Adult Health

## 2014-02-04 ENCOUNTER — Telehealth: Payer: Self-pay | Admitting: Pulmonary Disease

## 2014-02-04 DIAGNOSIS — N184 Chronic kidney disease, stage 4 (severe): Secondary | ICD-10-CM

## 2014-02-04 DIAGNOSIS — I5032 Chronic diastolic (congestive) heart failure: Secondary | ICD-10-CM

## 2014-02-04 NOTE — Telephone Encounter (Signed)
Please let pt or pt's family know that we have gotten her sleep study from [place] and reviewed.  She did have severe sleep apnea, but has lost 40 pounds since that time.  I suspect that her sleep apnea is more in mild to moderate range currently.  She really does not want to wear cpap, and would like to see how she does on oxygen alone and further weight loss. I would like to see her back in 63mos to check on things.  If the family feels she is excessively sleepy or not sleeping well, let us know.

## 2014-02-05 NOTE — Telephone Encounter (Signed)
Results have been explained to patient, pt expressed understanding. Pt states that she would like to try the O2 at night. Pt aware to let us know if she still experiences daytime sleepiness or difficulty sleeping. 23mo recall placed in chart

## 2014-02-13 MED ORDER — TORSEMIDE 20 MG PO TABS: 20.0000 mg | ORAL_TABLET | Freq: Every day | ORAL | Status: AC

## 2014-02-13 NOTE — Progress Notes (Signed)
Patient ID: [name] [name], female   DOB: 1930-03-14, 80 y.o.   MRN: [id number]     [facility] Allergies  Allergen Reactions  . Motrin [Ibuprofen] Hives     Chief Complaint  Patient presents with  . Acute Visit    follow up lab results     HPI:  Her diastolic heart failure is stable; her weights are stable. She is taking demadex 20 mg twice daily. Her renal function is worse with a bun of 53 and creat of 2.1. In Feb her bun was 29 and creat 1.4. She will require her demadex to be adjusted in order to manage her heart disease and renal failure.    Past Medical History  Diagnosis Date  . Hypertension   . Gout   . Hypercholesteremia   . Diabetes mellitus   . CHF (congestive heart failure)   . Renal disorder   . Cancer   . Edema   . Chronic diastolic CHF (congestive heart failure), NYHA class 2 12/27/2013  . Hypertensive heart disease 12/28/2013  . Pleural effusion on right 12/13/2013  . HCAP (healthcare-associated pneumonia) 12/22/2013  . Osteomyelitis of thoracic spine 11/29/2013  . Discitis of thoracic region 11/29/2013  . CKD (chronic kidney disease) stage 4, GFR 15-29 ml/min 11/20/2013  . Hyperparathyroidism 01/10/2014  . Generalized weakness 11/30/2013    Past Surgical History  Procedure Laterality Date  . Cesarean section    . Rotator cuff repair    . Tonsillectomy    . Tubal ligation      VITAL SIGNS BP 109/68  Pulse 65  Ht 5' (1.524 m)  Wt 200 lb 12.8 oz (91.082 kg)  BMI 39.22 kg/m2   Patient's Medications  New Prescriptions   No medications on file  Previous Medications   ACETAMINOPHEN (TYLENOL) 325 MG TABLET    Take 650 mg by mouth every 4 (four) hours as needed for fever. *takes if temperature over 99.5*   AMOXICILLIN (AMOXIL) 500 MG CAPSULE    Take 1 capsule (500 mg total) by mouth 2 (two) times daily.   CARVEDILOL (COREG) 25 MG TABLET    Take 1 tablet (25 mg total) by mouth 2 (two) times daily with a meal.   CLONIDINE (CATAPRES) 0.1 MG TABLET    Take 0.1  mg by mouth 2 (two) times daily.   DOXYCYCLINE (VIBRA-TABS) 100 MG TABLET    Take 1 tablet (100 mg total) by mouth 2 (two) times daily.   DULOXETINE (CYMBALTA) 60 MG CAPSULE    Take 60 mg by mouth daily.   FEEDING SUPPLEMENT, ENSURE, (ENSURE) PUDG    Take 1 Container by mouth 3 (three) times daily between meals.   HYDRALAZINE (APRESOLINE) 50 MG TABLET    Take 50 mg by mouth 4 (four) times daily.   HYDROCODONE-ACETAMINOPHEN (NORCO) 10-325 MG PER TABLET    Take 1 tablet by mouth every 6 (six) hours as needed for moderate pain.   IPRATROPIUM-ALBUTEROL (DUONEB) 0.5-2.5 (3) MG/3ML SOLN    Take 3 mLs by nebulization every 2 (two) hours as needed (shortness of breath).   ISOSORBIDE MONONITRATE (IMDUR) 15 MG TB24 24 HR TABLET    Take 0.5 tablets (15 mg total) by mouth daily.   LIDOCAINE (LIDODERM) 5 %    Place 1 patch onto the skin daily. Remove & Discard patch within 12 hours or as directed by MD   MULTIPLE VITAMINS-MINERALS (MULTIVITAMIN WITH MINERALS) TABLET    Take 1 tablet by mouth daily.   SENNA (  SENOKOT) 8.6 MG TABS TABLET    Take 2 tablets by mouth daily as needed.    SIMVASTATIN (ZOCOR) 20 MG TABLET    Take 20 mg by mouth daily at 6 PM.   VITAMIN C (ASCORBIC ACID) 500 MG TABLET    Take 500 mg by mouth daily.  Modified Medications   Modified Medication Previous Medication   TORSEMIDE (DEMADEX) 20 MG TABLET torsemide (DEMADEX) 20 MG tablet      Take 20 mg by mouth 2 (two) times daily.    Take 1 tablet (20 mg total) by mouth daily.  Discontinued Medications   No medications on file    SIGNIFICANT DIAGNOSTIC EXAMS    11-20-13: thoracic spine x-ray: 1. Highly unusual appearance of T10 and T11, as above, which is concern for potential discitis/osteomyelitis. Given the patient's history of recent fall, these findings could simply be related to acute trauma, however, the marked irregularity of the endplates at this level raises concern for infection. Correlation with MRI of the thoracic spine with  and without IV gadolinium is recommended. 2. The appearance of the lung parenchyma suggests an underlying interstitial lung disease. This could be better evaluated with followup nonemergent high-resolution chest CT if clinicallyappropriate.  11-20-13: mri lumbar spine: Abnormal destructive changes of the T10-11 disc,  No MR findings of discitis osteomyelitis in the lumbar spine. Degenerative change of the lumbar spine: Moderate canal stenosis at L3-4, mild at L4-5. Neural foraminal narrowing L2-3 to L5-S1: Severe on the right at L5-S1. Mildly widened facets at L3-4, which can be associated with dynamic instability and could be further characterize with flexion and extension radiographs if clinically indicated.  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 Moderate canal stenosis at T10-11 with severe neural foraminal narrowing. Abnormal signal within T11 disc concerning for early discitis without osteomyelitis. Degenerative change at this level resulting in mild canal stenosis and moderate to severe right, moderate left neural foraminal narrowing. Grade 1 paraspinal muscle strain T10-11   12-17-13: 2-d echo: Left ventricle: The cavity size was normal. Wall thickness was increased in a pattern of moderate LVH. Systolic function was normal. The estimated ejection fraction was in the range of 60% to 65%. Wall motion was normal; there were no regional wall motion abnormalities. - Left atrium: The atrium was mildly dilated.- Pulmonary arteries: Systolic pressure was mildly increased.  12-18-13: bilateral lower extremity doppler: - No evidence of deep vein thrombosis involving the right   lower extremity.- No evidence of deep vein thrombosis involving the left lower extremity.  12-28-13: ct of chest: 1. Apical predominant pulmonary fibrosis with  superimposed airspace disease. Airspace disease may represent acute phase interstitial pneumonitis or pneumonia (multifocal pneumonia). Mediastinal adenopathy may be reactive. Adenopathy also raises possibility sarcoidosis accounting for alveolar opacities and adenopathy. 2. Small to moderate bilateral pleural effusions layering dependently. Collapse/consolidation of both lower lobes associated with the effusions 3. Cardiomegaly and probable anemia.  01-19-14: ct of chest: 1. Compared to the prior study, some of the patchy airspace consolidation has largely resolved, although there continue to be patchy areas of fibrosis asymmetrically distributed throughout the lungs bilaterally, with an appearance which may suggest evolving cryptogenic organizing pneumonia (COP). Borderline enlarged and mildly enlarged mediastinal and hilar lymph nodes are presumably chronic and reactive. Followup high-resolution chest CT in 1 year would be useful to assess for temporal changes in the appearance  of the lung parenchyma. 2. Chronic changes of discitis osteomyelitis at T10-T11, similar to the prior study. 3. Interval resolution of previously noted bilateral pleural effusions. 4.  Atherosclerosis, including three-vessel coronary artery disease.  01-19-14: VQ scan: No significant ventilation/ perfusion mismatch. This study constitutes an overall low probability of pulmonary embolus.      LABS REVIEWED:   11-20-13: wbc 8.7; hgb 12.1; hct 37.3; mcv 86.1plt 202; glucose 125; bun 32; creat 1.85; k+3.9; na++144; ca++ 10.8; sed rate 27; BNP 894.4; hgb a1c 6.8; tsh 1.225 11-25-13: wbc 7.6; hgb 10.8; hct 34.1; mcv 86.3;plt 178; glucose 125; bun 20; creat 1.48; k+4.1; na++140; ca++ 10.2 BNP 1142  12-10-13: bun 15; creat 1.2 12-16-13: wbc 8.2; hgb 9.8 hct 30.2; mcv 83.0; plt 199; glucose 154; bun 39; creat 1.69 ;k+4.5;na++133 BNP: 11565.0; D-Dimer: 1.61; urine culture: no growth; influenza: negative.  12-27-13: wbc 8.0; hgb 9.4; hct 30.1;  mcv 84.6; plt 231 glucose 182; bun 27; creat 1.42; k+5.5; na++137; ca++10.9;  BNP 7200 12-28-13: wbc 7.0; hgb 8.2; hct 25.8; mcv 84.;9 plt 231; glucose 126; bun 28; creat 1.70; k+5.0; na++137 ca++10.6; tsh 1.291; ACE 53; vit d 26; PTH 176.8 01-01-14: 158; bun 28; creat 1.93; k+4.3; na++ 139; ca++ 10.7 01-18-14: glucose 106; bun 39; creat 2.0; k+3.8; na++ 135; C02 38; ca++ 10.7   01-21-14: glucose 100; bun 46; creat 1.9; k+3.5; na++ 133  02-03-14: glucose 113; bun 53; creat 2.1; k+3.9; na+=131       Review of Systems  Constitutional:negative  Respiratory: negative for shortness of breath Negative for cough.        Is using 02  Cardiovascular: Negative for chest pain and palpitations.  Gastrointestinal: Negative for abdominal pain and constipation.  Musculoskeletal: negative for back pain. Negative for myalgias.   Skin: Negative.   Neurological: Positive for weakness.  Psychiatric/Behavioral: The patient is not nervous/anxious.      Physical Exam  Constitutional: She appears well-developed and well-nourished. No distress.  obese  Neck: Neck supple. No JVD present.  Cardiovascular: Normal rate, regular rhythm and intact distal pulses.   Respiratory: Effort normal. No respiratory distress. She has no wheezes.  02 dependent; breath sounds diminished GI: Soft. Bowel sounds are normal. She exhibits no distension. There is no tenderness.  Musculoskeletal: She exhibits no edema.  Is able to move all extremities; has weakness present in all extremities.   Neurological: She is alert.  Skin: Skin is warm and dry. She is not diaphoretic.      ASSESSMENT/ PLAN:  1. Chronic diastolic heart failure with CKD stage IV: will lower her demadex to one time daily; will continue daily weights and will continue to monitor her status; will check bmp in 2 weeks       Ok Edwards NP [facility]  Contact [phone] Monday through Friday 8am- 5pm  After hours call [phone]

## 2014-02-18 LAB — BASIC METABOLIC PANEL
BUN: 37 mg/dL — AB (ref 4–21)
Creatinine: 1.9 mg/dL — AB (ref 0.5–1.1)
GLUCOSE: 130 mg/dL
Potassium: 3.8 mmol/L (ref 3.4–5.3)
SODIUM: 132 mmol/L — AB (ref 137–147)

## 2014-02-24 ENCOUNTER — Encounter: Payer: Self-pay | Admitting: Infectious Disease

## 2014-02-24 ENCOUNTER — Ambulatory Visit (INDEPENDENT_AMBULATORY_CARE_PROVIDER_SITE_OTHER): Payer: Medicare HMO | Admitting: Infectious Disease

## 2014-02-24 VITALS — BP 103/50 | HR 70 | Temp 98.1°F

## 2014-02-24 DIAGNOSIS — M519 Unspecified thoracic, thoracolumbar and lumbosacral intervertebral disc disorder: Secondary | ICD-10-CM

## 2014-02-24 DIAGNOSIS — M869 Osteomyelitis, unspecified: Secondary | ICD-10-CM

## 2014-02-24 DIAGNOSIS — M4640 Discitis, unspecified, site unspecified: Secondary | ICD-10-CM

## 2014-02-24 DIAGNOSIS — M4624 Osteomyelitis of vertebra, thoracic region: Secondary | ICD-10-CM

## 2014-02-24 DIAGNOSIS — B957 Other staphylococcus as the cause of diseases classified elsewhere: Secondary | ICD-10-CM

## 2014-02-24 LAB — CBC WITH DIFFERENTIAL/PLATELET
BASOS PCT: 1 % (ref 0–1)
Basophils Absolute: 0.1 10*3/uL (ref 0.0–0.1)
EOS ABS: 0.1 10*3/uL (ref 0.0–0.7)
EOS PCT: 1 % (ref 0–5)
HEMATOCRIT: 27.7 % — AB (ref 36.0–46.0)
Hemoglobin: 9 g/dL — ABNORMAL LOW (ref 12.0–15.0)
Lymphocytes Relative: 28 % (ref 12–46)
Lymphs Abs: 1.9 10*3/uL (ref 0.7–4.0)
MCH: 27.4 pg (ref 26.0–34.0)
MCHC: 32.5 g/dL (ref 30.0–36.0)
MCV: 84.2 fL (ref 78.0–100.0)
MONO ABS: 0.7 10*3/uL (ref 0.1–1.0)
MONOS PCT: 10 % (ref 3–12)
NEUTROS PCT: 60 % (ref 43–77)
Neutro Abs: 4.1 10*3/uL (ref 1.7–7.7)
Platelets: 206 10*3/uL (ref 150–400)
RBC: 3.29 MIL/uL — ABNORMAL LOW (ref 3.87–5.11)
RDW: 16.6 % — ABNORMAL HIGH (ref 11.5–15.5)
WBC: 6.9 10*3/uL (ref 4.0–10.5)

## 2014-02-24 LAB — C-REACTIVE PROTEIN

## 2014-02-24 LAB — BASIC METABOLIC PANEL WITH GFR
BUN: 39 mg/dL — ABNORMAL HIGH (ref 6–23)
CO2: 32 mEq/L (ref 19–32)
Calcium: 10.6 mg/dL — ABNORMAL HIGH (ref 8.4–10.5)
Chloride: 95 mEq/L — ABNORMAL LOW (ref 96–112)
Creat: 1.98 mg/dL — ABNORMAL HIGH (ref 0.50–1.10)
GFR, EST AFRICAN AMERICAN: 27 mL/min — AB
GFR, Est Non African American: 23 mL/min — ABNORMAL LOW
Glucose, Bld: 177 mg/dL — ABNORMAL HIGH (ref 70–99)
POTASSIUM: 4 meq/L (ref 3.5–5.3)
Sodium: 134 mEq/L — ABNORMAL LOW (ref 135–145)

## 2014-02-24 LAB — SEDIMENTATION RATE: Sed Rate: 42 mm/hr — ABNORMAL HIGH (ref 0–22)

## 2014-02-24 NOTE — Progress Notes (Signed)
   Subjective:    Patient ID: [name] [name], female    DOB: 08-21-1930, 80 y.o.   MRN: [id number]  HPI   80 y.o. female with DM, HTN, CKD 4 who presents with worsening back pain and recent falls found to have MRI findings concerning for T10-11 disckitis/osteo. She underwent IR guided biopsy of the lesion for cultures while she was already on  antibiotics and cultures did not yield an organism. She had a coagulase-negative staphylococcal species isolated from one out of 2 blood cultures likely due to a contaminant.  She's been treated with vancomycin and ceftriaxone and I saw her in late January at which time she still had  severe lower back pain although is slightly better than when she was in the hospital.    I ordered MRI of spine and this was actually re-assuring.   Her inflammatory markers remaiend elevated and we had her stay on protracted oral abx.   She has 4-5/10 LBP at best and 9/10 with moving. She has nausea with certain foods such as fish. She states she occ has "low grade fever" She spent much of visit on her cell phone. She claims she cannot walk at all.     Review of Systems  Constitutional: Negative for fever, chills, diaphoresis, activity change, appetite change, fatigue and unexpected weight change.  HENT: Negative for congestion, rhinorrhea, sinus pressure, sneezing, sore throat and trouble swallowing.   Eyes: Negative for photophobia and visual disturbance.  Respiratory: Positive for shortness of breath. Negative for cough, chest tightness, wheezing and stridor.   Gastrointestinal: Positive for nausea. Negative for vomiting, abdominal pain, diarrhea, constipation, blood in stool, abdominal distention and anal bleeding.  Genitourinary: Negative for dysuria, hematuria, flank pain and difficulty urinating.  Musculoskeletal: Positive for arthralgias and back pain. Negative for gait problem, joint swelling and myalgias.  Skin: Negative for color change, pallor, rash and  wound.  Neurological: Positive for weakness. Negative for dizziness, tremors and light-headedness.  Hematological: Negative for adenopathy. Does not bruise/bleed easily.  Psychiatric/Behavioral: Negative for behavioral problems, confusion, sleep disturbance, dysphoric mood, decreased concentration and agitation.       Objective:   Physical Exam  Constitutional: She is oriented to person, place, and time. She appears well-nourished. No distress.  HENT:  Head: Normocephalic and atraumatic.  Eyes: Conjunctivae and EOM are normal. No scleral icterus.  Neck: Normal range of motion. Neck supple.  Cardiovascular: Normal rate and regular rhythm.   Pulmonary/Chest: Effort normal. No respiratory distress. She has no wheezes.  Abdominal: She exhibits no distension. There is no tenderness.  Neurological: She is alert and oriented to person, place, and time. She exhibits normal muscle tone. Coordination normal.  Skin: Skin is warm and dry. She is not diaphoretic. No erythema. No pallor.  Psychiatric: Her behavior is normal. Judgment and thought content normal. She exhibits a depressed mood.   Site of her IR guided bx is clean:         Assessment & Plan:   T10-11 disckitis/osteomyelitis: recheck ESR and CRP today.   I am asking for repeat labs today including ESR and CRP  Will recheck MRI T and L spine  RTC in 2 months  I spent greater than 25 minutes with the patient including greater than 50% of time in face to face counsel of the patient and in coordination of their care.   CNS 1/2 from blood: likely was contaminant and is more than treated by now.

## 2014-03-01 ENCOUNTER — Telehealth: Payer: Self-pay | Admitting: *Deleted

## 2014-03-01 ENCOUNTER — Non-Acute Institutional Stay (SKILLED_NURSING_FACILITY): Payer: Medicare HMO | Admitting: Adult Health

## 2014-03-01 DIAGNOSIS — E785 Hyperlipidemia, unspecified: Secondary | ICD-10-CM

## 2014-03-01 DIAGNOSIS — I119 Hypertensive heart disease without heart failure: Secondary | ICD-10-CM

## 2014-03-01 DIAGNOSIS — M4624 Osteomyelitis of vertebra, thoracic region: Secondary | ICD-10-CM

## 2014-03-01 DIAGNOSIS — M869 Osteomyelitis, unspecified: Secondary | ICD-10-CM

## 2014-03-01 DIAGNOSIS — F3289 Other specified depressive episodes: Secondary | ICD-10-CM

## 2014-03-01 DIAGNOSIS — I5032 Chronic diastolic (congestive) heart failure: Secondary | ICD-10-CM

## 2014-03-01 DIAGNOSIS — J8410 Pulmonary fibrosis, unspecified: Secondary | ICD-10-CM

## 2014-03-01 DIAGNOSIS — M549 Dorsalgia, unspecified: Secondary | ICD-10-CM

## 2014-03-01 DIAGNOSIS — N184 Chronic kidney disease, stage 4 (severe): Secondary | ICD-10-CM

## 2014-03-01 DIAGNOSIS — F32A Depression, unspecified: Secondary | ICD-10-CM

## 2014-03-01 DIAGNOSIS — F329 Major depressive disorder, single episode, unspecified: Secondary | ICD-10-CM

## 2014-03-01 NOTE — Telephone Encounter (Signed)
MRI (thoracic and lumbar) scheduled for 4/24, 2:00 (arrival 1:45).  Left message with Janett Billow at [facility]. Landis Gandy, RN

## 2014-03-01 NOTE — Telephone Encounter (Signed)
Authorization # [id number]

## 2014-03-12 ENCOUNTER — Ambulatory Visit (HOSPITAL_COMMUNITY): Payer: Medicare HMO

## 2014-03-12 ENCOUNTER — Ambulatory Visit (HOSPITAL_COMMUNITY)
Admission: RE | Admit: 2014-03-12 | Discharge: 2014-03-12 | Disposition: A | Discharge status: Home / Self Care | Payer: Medicare HMO | Source: Ambulatory Visit | Attending: Infectious Disease | Admitting: Infectious Disease

## 2014-03-12 DIAGNOSIS — R262 Difficulty in walking, not elsewhere classified: Secondary | ICD-10-CM | POA: Insufficient documentation

## 2014-03-12 DIAGNOSIS — M4624 Osteomyelitis of vertebra, thoracic region: Secondary | ICD-10-CM

## 2014-03-12 DIAGNOSIS — M869 Osteomyelitis, unspecified: Secondary | ICD-10-CM | POA: Insufficient documentation

## 2014-03-15 ENCOUNTER — Telehealth: Payer: Self-pay | Admitting: Infectious Disease

## 2014-03-15 NOTE — Telephone Encounter (Signed)
Confirmed appointment with patient's daughter.  Per daughter, patient looks and does better: is up with 1 assist, getting from the bed to the chair, is standing for 1-2 minutes at a time, has been taking less pain medication.  Patient's daughter will not be able to be here at the appointment, but would like to call in for the appointment. [place], spoke with her nurse Suanne Marker to confirm appointment. Landis Gandy, RN

## 2014-03-15 NOTE — Telephone Encounter (Signed)
[name] [name]'s MRI looks MUCH, MUCH worse  Can we call her to see how she is feeling  Can we bring her back this week or next to discuss findings.  Our options are to restart IV antbiotics YET again, or continuee pills, there does not seem to be a surgical or IR target that isnew

## 2014-03-15 NOTE — Telephone Encounter (Addendum)
Called patient per Dr Tommy Medal to advise her that her MRI is not better and that he needs to see her this week but she did not answer the phone so I left a message for her or her daugher to call the office. Will advise her that Dr Tommy Medal has an appt 03/17/14 at 9 am if she can make it.

## 2014-03-16 NOTE — Telephone Encounter (Signed)
Thanks Michelle

## 2014-03-17 ENCOUNTER — Ambulatory Visit (INDEPENDENT_AMBULATORY_CARE_PROVIDER_SITE_OTHER): Payer: Medicare HMO | Admitting: Infectious Disease

## 2014-03-17 ENCOUNTER — Encounter: Payer: Self-pay | Admitting: Infectious Disease

## 2014-03-17 VITALS — BP 150/68 | HR 77 | Temp 98.1°F | Ht 62.0 in | Wt 198.0 lb

## 2014-03-17 DIAGNOSIS — I509 Heart failure, unspecified: Secondary | ICD-10-CM

## 2014-03-17 DIAGNOSIS — M4624 Osteomyelitis of vertebra, thoracic region: Secondary | ICD-10-CM

## 2014-03-17 DIAGNOSIS — M519 Unspecified thoracic, thoracolumbar and lumbosacral intervertebral disc disorder: Secondary | ICD-10-CM

## 2014-03-17 DIAGNOSIS — M869 Osteomyelitis, unspecified: Secondary | ICD-10-CM

## 2014-03-17 DIAGNOSIS — M4640 Discitis, unspecified, site unspecified: Secondary | ICD-10-CM

## 2014-03-17 LAB — C-REACTIVE PROTEIN: CRP: <0.5 mg/dL (ref <0.60)

## 2014-03-17 LAB — BASIC METABOLIC PANEL WITH GFR
BUN: 36 mg/dL — ABNORMAL HIGH (ref 6–23)
CO2: 30 mEq/L (ref 19–32)
Calcium: 11.1 mg/dL — ABNORMAL HIGH (ref 8.4–10.5)
Chloride: 98 mEq/L (ref 96–112)
Creat: 1.78 mg/dL — ABNORMAL HIGH (ref 0.50–1.10)
GFR, EST AFRICAN AMERICAN: 30 mL/min — AB
GFR, EST NON AFRICAN AMERICAN: 26 mL/min — AB
GLUCOSE: 180 mg/dL — AB (ref 70–99)
Potassium: 4.2 mEq/L (ref 3.5–5.3)
SODIUM: 137 meq/L (ref 135–145)

## 2014-03-17 LAB — CK: Total CK: 35 U/L (ref 7–177)

## 2014-03-17 MED ORDER — DEXTROSE 5 % IV SOLN: 2.0000 g | Freq: Two times a day (BID) | INTRAVENOUS | Status: AC

## 2014-03-17 MED ORDER — DAPTOMYCIN 500 MG IV SOLR: 540.0000 mg | INTRAVENOUS | Status: AC

## 2014-03-17 NOTE — Patient Instructions (Signed)
We will stop your oral antibiotics  We will get blood work today  We will start an PICC line and  CUBICIN 6mg /kg every 2 days  And  Cefepime 2 g every day  We need you to stop your statin drug  We will have facility check weekly cbc, bmp and CK and have them faxed to me at [phone]  We will repeat MRI prior to stopping the iV abx

## 2014-03-17 NOTE — Progress Notes (Signed)
Subjective:    Patient ID: [name] [name], female    DOB: 1930/03/28, 80 y.o.   MRN: [id number]  HPI   80 y.o. female with DM, HTN, CKD 4 who presents with worsening back pain and recent falls found to have MRI findings concerning for T10-11 disckitis/osteo. She underwent IR guided biopsy of the lesion for cultures while she was already on  antibiotics and cultures did not yield an organism. She had a coagulase-negative staphylococcal species isolated from one out of 2 blood cultures likely due to a contaminant.  She's been treated with vancomycin and ceftriaxone and I saw her in late January at which time she still had  severe lower back pain although is slightly better than when she was in the hospital.    I ordered MRI of spine at that time and this was actually re-assuring.   Her inflammatory markers remaiend elevated and we had her stay on protracted oral abx.   In the meantime her LBP had worsened when I last saw her and I had repeat MRI T and L spine repeated and this showed:   IMPRESSION:   1. Evolution of T10-T11 discitis osteomyelitis with further  vertebral body destruction. Associated increased spinal stenosis,  with effaced thoracic spinal cord at this level. No definite cord  compression or spinal cord signal abnormality.  2. No paraspinal abscess or drainable fluid collection identified.  No new spinal infection identified.  3. Other thoracic levels are stable, including mild degenerative  multifactorial spinal stenosis at T11-T12.  4. Stable lumbar spine, including degenerative spinal stenosis at  L3-L4.  I brought her back to review her films with her and discuss options for therapy at this point.  She actually has been MORE mobile and with less pain than since the last time I saw her.   Review of Systems  Constitutional: Negative for fever, chills, diaphoresis, activity change, appetite change, fatigue and unexpected weight change.  HENT: Negative for  congestion, rhinorrhea, sinus pressure, sneezing, sore throat and trouble swallowing.   Eyes: Negative for photophobia and visual disturbance.  Respiratory: Negative for cough, chest tightness, wheezing and stridor.   Gastrointestinal: Negative for vomiting, abdominal pain, diarrhea, constipation, blood in stool, abdominal distention and anal bleeding.  Genitourinary: Negative for dysuria, hematuria, flank pain and difficulty urinating.  Musculoskeletal: Positive for back pain. Negative for gait problem, joint swelling and myalgias.  Skin: Negative for color change, pallor, rash and wound.  Neurological: Positive for weakness. Negative for dizziness, tremors and light-headedness.  Hematological: Negative for adenopathy. Does not bruise/bleed easily.  Psychiatric/Behavioral: Negative for behavioral problems, confusion, sleep disturbance, dysphoric mood, decreased concentration and agitation.       Objective:   Physical Exam  Constitutional: She is oriented to person, place, and time. She appears well-nourished. No distress.  HENT:  Head: Normocephalic and atraumatic.  Eyes: Conjunctivae and EOM are normal. No scleral icterus.  Neck: Normal range of motion. Neck supple.  Cardiovascular: Normal rate and regular rhythm.   Pulmonary/Chest: Effort normal. No respiratory distress. She has no wheezes.  Abdominal: She exhibits no distension. There is no tenderness.  Neurological: She is alert and oriented to person, place, and time. She exhibits normal muscle tone. Coordination normal.  Skin: Skin is warm and dry. She is not diaphoretic. No erythema. No pallor.  Psychiatric: Her behavior is normal. Judgment and thought content normal. She exhibits a depressed mood.   Site of her IR guided bx is clean:  Assessment & Plan:   T10-11 disckitis/osteomyelitis: recheck ESR and CRP today.   II had extensive discussion with the patient, her Grandson who was in the room and her daughter  over speaker phone on cell phone   I spent greater than 40 minutes with the patient including greater than 50% of time in face to face counsel of the patient and in coordination of their care.   We will place PICC line and start IV daptomycin 34m/kg IV q 48 hours along with cefepime 2 g daily to cover her broadly vs MRSA, MSSA, strep but now also pseudomonas and treat for at least 6 weeks  We will stop her statin, and get baseline CK, and have weekly cbc, CK and cbc to faxed to me at [phone] We will also get an MRI before stopping IV abx  I am going avoid vancomycin given pts CKD, her risk for vancomycin nephrotoxicity and her problems juggling need for IV abx, IVF complications and balancing vs her CHF

## 2014-03-19 ENCOUNTER — Non-Acute Institutional Stay (SKILLED_NURSING_FACILITY): Payer: Medicare HMO | Admitting: Adult Health

## 2014-03-19 ENCOUNTER — Encounter: Payer: Self-pay | Admitting: Adult Health

## 2014-03-19 DIAGNOSIS — M869 Osteomyelitis, unspecified: Secondary | ICD-10-CM

## 2014-03-19 DIAGNOSIS — N184 Chronic kidney disease, stage 4 (severe): Secondary | ICD-10-CM

## 2014-03-19 DIAGNOSIS — M4624 Osteomyelitis of vertebra, thoracic region: Secondary | ICD-10-CM

## 2014-03-19 NOTE — Progress Notes (Signed)
Patient ID: [name] [name], female   DOB: [birth year], 80 y.o.   MRN: [id number]     [facility]  Allergies  Allergen Reactions  . Motrin [Ibuprofen] Hives    Chief Complaint  Patient presents with  . Acute Visit    follow up patient status.     HPI:  She has been seen by I/D on 03-17-14 for her osteomyelitis of her thoracic spine. The mri demonstrated worsening disease state. She will require 6 weeks worth of IV abt in order to being treatment of her disease process. She has failed po abt.. Her current pain regimen is effective at this time. There are no other concerns being voiced at this time.   Past Medical History  Diagnosis Date  . Hypertension   . Gout   . Hypercholesteremia   . Diabetes mellitus   . CHF (congestive heart failure)   . Renal disorder   . Cancer   . Edema   . Chronic diastolic CHF (congestive heart failure), NYHA class 2 12/27/2013  . Hypertensive heart disease 12/28/2013  . Pleural effusion on right 12/13/2013  . HCAP (healthcare-associated pneumonia) 12/22/2013  . Osteomyelitis of thoracic spine 11/29/2013  . Discitis of thoracic region 11/29/2013  . CKD (chronic kidney disease) stage 4, GFR 15-29 ml/min 11/20/2013  . Hyperparathyroidism 01/10/2014  . Generalized weakness 11/30/2013    Past Surgical History  Procedure Laterality Date  . Cesarean section    . Rotator cuff repair    . Tonsillectomy    . Tubal ligation      VITAL SIGNS BP 124/78  Pulse 70  Ht 5\' 2"  (1.575 m)  Wt 198 lb 12.8 oz (90.175 kg)  BMI 36.35 kg/m2   Patient's Medications  New Prescriptions   No medications on file  Previous Medications   ACETAMINOPHEN (TYLENOL) 325 MG TABLET    Take 650 mg by mouth every 4 (four) hours as needed for fever. *takes if temperature over 99.5*   CARVEDILOL (COREG) 25 MG TABLET    Take 1 tablet (25 mg total) by mouth 2 (two) times daily with a meal.   CEFEPIME 2 G IN DEXTROSE 5 % 50 ML    Inject 2 g into the vein every 12 (twelve) hours.   CLONIDINE (CATAPRES) 0.1 MG TABLET    Take 0.1 mg by mouth 2 (two) times daily.   DAPTOMYCIN (CUBICIN) 500 MG INJECTION    Inject 10.8 mLs (540 mg total) into the vein every other day.   DULOXETINE (CYMBALTA) 60 MG CAPSULE    Take 60 mg by mouth daily.   FEEDING SUPPLEMENT, ENSURE, (ENSURE) PUDG    Take 1 Container by mouth 3 (three) times daily between meals.   HYDRALAZINE (APRESOLINE) 50 MG TABLET    Take 50 mg by mouth 4 (four) times daily.   HYDROCODONE-ACETAMINOPHEN (NORCO) 10-325 MG PER TABLET    Take 1 tablet by mouth every 6 (six) hours as needed for moderate pain.   IPRATROPIUM-ALBUTEROL (DUONEB) 0.5-2.5 (3) MG/3ML SOLN    Take 3 mLs by nebulization every 2 (two) hours as needed (shortness of breath).   ISOSORBIDE MONONITRATE (IMDUR) 15 MG TB24 24 HR TABLET    Take 0.5 tablets (15 mg total) by mouth daily.   LIDOCAINE (LIDODERM) 5 %    Place 1 patch onto the skin daily. Remove & Discard patch within 12 hours or as directed by MD   MULTIPLE VITAMINS-MINERALS (MULTIVITAMIN WITH MINERALS) TABLET    Take 1 tablet by  mouth daily.   ONDANSETRON (ZOFRAN) 4 MG TABLET       SENNA (SENOKOT) 8.6 MG TABS TABLET    Take 2 tablets by mouth daily as needed.    TORSEMIDE (DEMADEX) 20 MG TABLET    Take 1 tablet (20 mg total) by mouth daily.   VITAMIN C (ASCORBIC ACID) 500 MG TABLET    Take 500 mg by mouth daily.  Modified Medications   No medications on file  Discontinued Medications   No medications on file    SIGNIFICANT DIAGNOSTIC EXAMS   11-20-13: thoracic spine x-ray: 1. Highly unusual appearance of T10 and T11, as above, which is concern for potential discitis/osteomyelitis. Given the patient's history of recent fall, these findings could simply be related to acute trauma, however, the marked irregularity of the endplates at this level raises concern for infection. Correlation with MRI of the thoracic spine with and without IV gadolinium is recommended. 2. The appearance of the lung parenchyma  suggests an underlying interstitial lung disease. This could be better evaluated with followup nonemergent high-resolution chest CT if clinicallyappropriate.  11-20-13: mri lumbar spine: Abnormal destructive changes of the T10-11 disc,  No MR findings of discitis osteomyelitis in the lumbar spine. Degenerative change of the lumbar spine: Moderate canal stenosis at L3-4, mild at L4-5. Neural foraminal narrowing L2-3 to L5-S1: Severe on the right at L5-S1. Mildly widened facets at L3-4, which can be associated with dynamic instability and could be further characterize with flexion and extension radiographs if clinically indicated.  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 Moderate canal stenosis at T10-11 with severe neural foraminal narrowing. Abnormal signal within T11 disc concerning for early discitis without osteomyelitis. Degenerative change at this level resulting in mild canal stenosis and moderate to severe right, moderate left neural foraminal narrowing. Grade 1 paraspinal muscle strain T10-11   12-17-13: 2-d echo: Left ventricle: The cavity size was normal. Wall thickness was increased in a pattern of moderate LVH. Systolic function was normal. The estimated ejection fraction was in the range of 60% to 65%. Wall motion was normal; there were no regional wall motion abnormalities. - Left atrium: The atrium was mildly dilated.- Pulmonary arteries: Systolic pressure was mildly increased.  12-18-13: bilateral lower extremity doppler: - No evidence of deep vein thrombosis involving the right   lower extremity.- No evidence of deep vein thrombosis involving the left lower extremity.  12-28-13: ct of chest: 1. Apical predominant pulmonary fibrosis with superimposed airspace disease. Airspace disease may represent acute phase interstitial  pneumonitis or pneumonia (multifocal pneumonia). Mediastinal adenopathy may be reactive. Adenopathy also raises possibility sarcoidosis accounting for alveolar opacities and adenopathy. 2. Small to moderate bilateral pleural effusions layering dependently. Collapse/consolidation of both lower lobes associated with the effusions 3. Cardiomegaly and probable anemia.  01-19-14: ct of chest: 1. Compared to the prior study, some of the patchy airspace consolidation has largely resolved, although there continue to be patchy areas of fibrosis asymmetrically distributed throughout the lungs bilaterally, with an appearance which may suggest evolving cryptogenic organizing pneumonia (COP). Borderline enlarged and mildly enlarged mediastinal and hilar lymph nodes are presumably chronic and reactive. Followup high-resolution chest CT in 1 year would be useful to assess for temporal changes in the appearance of the lung parenchyma. 2. Chronic changes of discitis osteomyelitis at T10-T11, similar to the prior study. 3. Interval resolution of previously noted  bilateral pleural effusions. 4.  Atherosclerosis, including three-vessel coronary artery disease.  01-19-14: VQ scan: No significant ventilation/ perfusion mismatch. This study constitutes an overall low probability of pulmonary embolus.  03-12-14: lumbar/thoracic spine mri: 1. Evolution of T10-T11 discitis osteomyelitis with further  vertebral body destruction. Associated increased spinal stenosis,  with effaced thoracic spinal cord at this level. No definite cord compression or spinal cord signal abnormality. 2. No paraspinal abscess or drainable fluid collection identified. No new spinal infection identified. 3. Other thoracic levels are stable, including mild degenerative multifactorial spinal stenosis at T11-T12. 4. Stable lumbar spine, including degenerative spinal stenosis at L3-L4.   03-17-14: chest x-ray; 1. Poor inspiration secondary to patient's  condition/habitus. 2. Mild cardiomegaly with mild to moderate pulmonary vascular congestion 3. Right sides picc line with distal tip at the superior vena cava. 4. Patchy atelectasis or early/mild pneumonia at the lower lungs. 5. Mild peribronchial thickening unchanged likely chronic       LABS REVIEWED:   11-20-13: wbc 8.7; hgb 12.1; hct 37.3; mcv 86.1plt 202; glucose 125; bun 32; creat 1.85; k+3.9; na++144; ca++ 10.8; sed rate 27; BNP 894.4; hgb a1c 6.8; tsh 1.225 11-25-13: wbc 7.6; hgb 10.8; hct 34.1; mcv 86.3;plt 178; glucose 125; bun 20; creat 1.48; k+4.1; na++140; ca++ 10.2 BNP 1142  12-10-13: bun 15; creat 1.2 12-16-13: wbc 8.2; hgb 9.8 hct 30.2; mcv 83.0; plt 199; glucose 154; bun 39; creat 1.69 ;k+4.5;na++133 BNP: 11565.0; D-Dimer: 1.61; urine culture: no growth; influenza: negative.  12-27-13: wbc 8.0; hgb 9.4; hct 30.1; mcv 84.6; plt 231 glucose 182; bun 27; creat 1.42; k+5.5; na++137; ca++10.9;  BNP 7200 12-28-13: wbc 7.0; hgb 8.2; hct 25.8; mcv 84.;9 plt 231; glucose 126; bun 28; creat 1.70; k+5.0; na++137 ca++10.6; tsh 1.291; ACE 53; vit d 26; PTH 176.8 01-01-14: 158; bun 28; creat 1.93; k+4.3; na++ 139; ca++ 10.7 01-18-14: glucose 106; bun 39; creat 2.0; k+3.8; na++ 135; C02 38; ca++ 10.7   01-21-14: glucose 100; bun 46; creat 1.9; k+3.5; na++ 133  02-03-14: glucose 113; bun 53; creat 2.1; k+3.9; na++131  02-18-14: glucose 130; bun 37; creat 1.9; k+3.8; na++ 132       Review of Systems  Constitutional:negative  Respiratory: negative for shortness of breath Negative for cough.        Is using 02  Cardiovascular: Negative for chest pain and palpitations.  Gastrointestinal: Negative for abdominal pain and constipation.  Musculoskeletal: negative for back pain. Negative for myalgias.   Skin: Negative.   Neurological: Positive for weakness.  Psychiatric/Behavioral: The patient is not nervous/anxious.      Physical Exam  Constitutional: She appears well-developed and well-nourished. No  distress.  obese  Neck: Neck supple. No JVD present.  Cardiovascular: Normal rate, regular rhythm and intact distal pulses.   Respiratory: Effort normal. No respiratory distress. She has no wheezes.  02 dependent; breath sounds diminished GI: Soft. Bowel sounds are normal. She exhibits no distension. There is no tenderness.  Musculoskeletal: She exhibits no edema.  Is able to move all extremities; has weakness present in all extremities.   Neurological: She is alert.  Skin: Skin is warm and dry. She is not diaphoretic. picc line right upper arm       ASSESSMENT/ PLAN:  1. Osteomyelitis thoracic spine: is worse; will continue her iv cefepim 2 gm every 12 hours and iv cubicin 540 mg every 48 hours for a total of 6 weeks. Her po abt was stopped. She has failed her po abt.will check  cbc; bmp and ck weekly  Will continue to monitor her status.   2. CKD stage IV: her renal function is without change; will continue to monitor her status and will check her blood work weekly               Ok Edwards NP [facility]  Contact [phone] Monday through Friday 8am- 5pm  After hours call [phone]

## 2014-03-19 NOTE — Progress Notes (Signed)
Patient ID: [name] [name], female   DOB: [birth year], 80 y.o.   MRN: [id number]     [facility]  Allergies  Allergen Reactions  . Motrin [Ibuprofen] Hives     Chief Complaint  Patient presents with  . Medical Management of Chronic Issues    HPI:  She is being seen for the management of her chronic illnesses. Overall she is without significant change in her status. There are no concerns being voiced by the nursing staff at this time. She is not voicing any concerns at this time.    Past Medical History  Diagnosis Date  . Hypertension   . Gout   . Hypercholesteremia   . Diabetes mellitus   . CHF (congestive heart failure)   . Renal disorder   . Cancer   . Edema   . Chronic diastolic CHF (congestive heart failure), NYHA class 2 12/27/2013  . Hypertensive heart disease 12/28/2013  . Pleural effusion on right 12/13/2013  . HCAP (healthcare-associated pneumonia) 12/22/2013  . Osteomyelitis of thoracic spine 11/29/2013  . Discitis of thoracic region 11/29/2013  . CKD (chronic kidney disease) stage 4, GFR 15-29 ml/min 11/20/2013  . Hyperparathyroidism 01/10/2014  . Generalized weakness 11/30/2013    Past Surgical History  Procedure Laterality Date  . Cesarean section    . Rotator cuff repair    . Tonsillectomy    . Tubal ligation      VITAL SIGNS BP 119/76  Pulse 59  Ht 5\' 2"  (1.575 m)  Wt 198 lb (89.812 kg)  BMI 36.21 kg/m2  Patient's Medications  New Prescriptions   No medications on file  Previous Medications   ACETAMINOPHEN (TYLENOL) 325 MG TABLET    Take 650 mg by mouth every 4 (four) hours as needed for fever. *takes if temperature over 99.5*   AMOXICILLIN (AMOXIL) 500 MG CAPSULE    Take 1 capsule (500 mg total) by mouth 2 (two) times daily.   CARVEDILOL (COREG) 25 MG TABLET    Take 1 tablet (25 mg total) by mouth 2 (two) times daily with a meal.   CLONIDINE (CATAPRES) 0.1 MG TABLET    Take 0.1 mg by mouth 2 (two) times daily.   DOXYCYCLINE (VIBRA-TABS) 100 MG TABLET     Take 1 tablet (100 mg total) by mouth 2 (two) times daily.   DULOXETINE (CYMBALTA) 60 MG CAPSULE    Take 60 mg by mouth daily.   FEEDING SUPPLEMENT, ENSURE, (ENSURE) PUDG    Take 1 Container by mouth 3 (three) times daily between meals.   HYDRALAZINE (APRESOLINE) 50 MG TABLET    Take 50 mg by mouth 4 (four) times daily.   HYDROCODONE-ACETAMINOPHEN (NORCO) 10-325 MG PER TABLET    Take 1 tablet by mouth every 6 (six) hours as needed for moderate pain.   IPRATROPIUM-ALBUTEROL (DUONEB) 0.5-2.5 (3) MG/3ML SOLN    Take 3 mLs by nebulization every 2 (two) hours as needed (shortness of breath).   ISOSORBIDE MONONITRATE (IMDUR) 15 MG TB24 24 HR TABLET    Take 0.5 tablets (15 mg total) by mouth daily.   LIDOCAINE (LIDODERM) 5 %    Place 1 patch onto the skin daily. Remove & Discard patch within 12 hours or as directed by MD   MULTIPLE VITAMINS-MINERALS (MULTIVITAMIN WITH MINERALS) TABLET    Take 1 tablet by mouth daily.   SENNA (SENOKOT) 8.6 MG TABS TABLET    Take 2 tablets by mouth daily as needed.    SIMVASTATIN (ZOCOR) 20 MG  TABLET    Take 20 mg by mouth daily at 6 PM.   VITAMIN C (ASCORBIC ACID) 500 MG TABLET    Take 500 mg by mouth daily.  Modified Medications   Modified Medication Previous Medication  Discontinued Medications   No medications on file    SIGNIFICANT DIAGNOSTIC EXAMS    11-20-13: thoracic spine x-ray: 1. Highly unusual appearance of T10 and T11, as above, which is concern for potential discitis/osteomyelitis. Given the patient's history of recent fall, these findings could simply be related to acute trauma, however, the marked irregularity of the endplates at this level raises concern for infection. Correlation with MRI of the thoracic spine with and without IV gadolinium is recommended. 2. The appearance of the lung parenchyma suggests an underlying interstitial lung disease. This could be better evaluated with followup nonemergent high-resolution chest CT if  clinicallyappropriate.  11-20-13: mri lumbar spine: Abnormal destructive changes of the T10-11 disc,  No MR findings of discitis osteomyelitis in the lumbar spine. Degenerative change of the lumbar spine: Moderate canal stenosis at L3-4, mild at L4-5. Neural foraminal narrowing L2-3 to L5-S1: Severe on the right at L5-S1. Mildly widened facets at L3-4, which can be associated with dynamic instability and could be further characterize with flexion and extension radiographs if clinically indicated.  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 Moderate canal stenosis at T10-11 with severe neural foraminal narrowing. Abnormal signal within T11 disc concerning for early discitis without osteomyelitis. Degenerative change at this level resulting in mild canal stenosis and moderate to severe right, moderate left neural foraminal narrowing. Grade 1 paraspinal muscle strain T10-11   12-17-13: 2-d echo: Left ventricle: The cavity size was normal. Wall thickness was increased in a pattern of moderate LVH. Systolic function was normal. The estimated ejection fraction was in the range of 60% to 65%. Wall motion was normal; there were no regional wall motion abnormalities. - Left atrium: The atrium was mildly dilated.- Pulmonary arteries: Systolic pressure was mildly increased.  12-18-13: bilateral lower extremity doppler: - No evidence of deep vein thrombosis involving the right   lower extremity.- No evidence of deep vein thrombosis involving the left lower extremity.  12-28-13: ct of chest: 1. Apical predominant pulmonary fibrosis with superimposed airspace disease. Airspace disease may represent acute phase interstitial pneumonitis or pneumonia (multifocal pneumonia). Mediastinal adenopathy may be reactive. Adenopathy also raises possibility sarcoidosis  accounting for alveolar opacities and adenopathy. 2. Small to moderate bilateral pleural effusions layering dependently. Collapse/consolidation of both lower lobes associated with the effusions 3. Cardiomegaly and probable anemia.  01-19-14: ct of chest: 1. Compared to the prior study, some of the patchy airspace consolidation has largely resolved, although there continue to be patchy areas of fibrosis asymmetrically distributed throughout the lungs bilaterally, with an appearance which may suggest evolving cryptogenic organizing pneumonia (COP). Borderline enlarged and mildly enlarged mediastinal and hilar lymph nodes are presumably chronic and reactive. Followup high-resolution chest CT in 1 year would be useful to assess for temporal changes in the appearance of the lung parenchyma. 2. Chronic changes of discitis osteomyelitis at T10-T11, similar to the prior study. 3. Interval resolution of previously noted bilateral pleural effusions. 4.  Atherosclerosis, including three-vessel coronary artery disease.  01-19-14: VQ scan: No significant ventilation/ perfusion mismatch. This study constitutes an overall low probability of pulmonary embolus.      LABS REVIEWED:  11-20-13: wbc 8.7; hgb 12.1; hct 37.3; mcv 86.1plt 202; glucose 125; bun 32; creat 1.85; k+3.9; na++144; ca++ 10.8; sed rate 27; BNP 894.4; hgb a1c 6.8; tsh 1.225 11-25-13: wbc 7.6; hgb 10.8; hct 34.1; mcv 86.3;plt 178; glucose 125; bun 20; creat 1.48; k+4.1; na++140; ca++ 10.2 BNP 1142  12-10-13: bun 15; creat 1.2 12-16-13: wbc 8.2; hgb 9.8 hct 30.2; mcv 83.0; plt 199; glucose 154; bun 39; creat 1.69 ;k+4.5;na++133 BNP: 11565.0; D-Dimer: 1.61; urine culture: no growth; influenza: negative.  12-27-13: wbc 8.0; hgb 9.4; hct 30.1; mcv 84.6; plt 231 glucose 182; bun 27; creat 1.42; k+5.5; na++137; ca++10.9;  BNP 7200 12-28-13: wbc 7.0; hgb 8.2; hct 25.8; mcv 84.;9 plt 231; glucose 126; bun 28; creat 1.70; k+5.0; na++137 ca++10.6; tsh 1.291; ACE 53; vit  d 26; PTH 176.8 01-01-14: 158; bun 28; creat 1.93; k+4.3; na++ 139; ca++ 10.7 01-18-14: glucose 106; bun 39; creat 2.0; k+3.8; na++ 135; C02 38; ca++ 10.7   01-21-14: glucose 100; bun 46; creat 1.9; k+3.5; na++ 133  02-03-14: glucose 113; bun 53; creat 2.1; k+3.9; na+=131       Review of Systems  Constitutional:negative  Respiratory: negative for shortness of breath Negative for cough.        Is using 02  Cardiovascular: Negative for chest pain and palpitations.  Gastrointestinal: Negative for abdominal pain and constipation.  Musculoskeletal: negative for back pain. Negative for myalgias.   Skin: Negative.   Neurological: Positive for weakness.  Psychiatric/Behavioral: The patient is not nervous/anxious.      Physical Exam  Constitutional: She appears well-developed and well-nourished. No distress.  obese  Neck: Neck supple. No JVD present.  Cardiovascular: Normal rate, regular rhythm and intact distal pulses.   Respiratory: Effort normal. No respiratory distress. She has no wheezes.  02 dependent; breath sounds diminished GI: Soft. Bowel sounds are normal. She exhibits no distension. There is no tenderness.  Musculoskeletal: She exhibits no edema.  Is able to move all extremities; has weakness present in all extremities.   Neurological: She is alert.  Skin: Skin is warm and dry. She is not diaphoretic.      ASSESSMENT/ PLAN:  1. Hypertension; is stable: will continue clonidine 0.1 mg twice daily; coreg 25 mg twice daily; apresoline 50 mg four times daily and will monitor his status  2. Chronic diastolic heart failure: is stable will continue demadex 20 mg daily and imdur 15 mg daily   3. Chronic back pain: her back is presently being managed with the current regimen: lidoderm patch; cymbalta 60 mg daily; and vicodin 10/325 mg every 6 hours as needed   4. Depression: she is emotionally doing well; will continue cymbalta 60 mg daily and will monitor her status   5. Thoracic  spine osteomyelitis: she is presently stable; will continue her doxycycline 100 mg twice daily and amoxil 500 mg twice daily will continue to monitor her status   6. Dyslipidemia: will continue zocor 20 mg daily  7. Pulmonary fibrosis: she is presently stable: will continue duoneb every 2 hours as needed; she is due to pulmonology in the near future for cpap mask fitting. Will continue to monitor her status.       Ok Edwards NP [facility]  Contact [phone] Monday through Friday 8am- 5pm  After hours call [phone]

## 2014-03-22 ENCOUNTER — Ambulatory Visit (HOSPITAL_COMMUNITY): Admission: RE | Admit: 2014-03-22 | Payer: Medicare HMO | Source: Ambulatory Visit

## 2014-03-25 ENCOUNTER — Telehealth: Payer: Self-pay | Admitting: *Deleted

## 2014-03-25 NOTE — Telephone Encounter (Signed)
Lab results - 03/24/14, ABNL results.  Attempted to call Bard Herbert, NP at [facility].  Unable to speak with the NP.  Pt with diagnosis of Stage IV Renal Disease with abnl kidney function test results.  Will give Dr. Tommy Medal the faxed results when he comes to [facility] this PM.

## 2014-04-02 ENCOUNTER — Non-Acute Institutional Stay (SKILLED_NURSING_FACILITY): Payer: Medicare HMO | Admitting: Adult Health

## 2014-04-02 ENCOUNTER — Other Ambulatory Visit: Payer: Self-pay

## 2014-04-02 DIAGNOSIS — N184 Chronic kidney disease, stage 4 (severe): Secondary | ICD-10-CM

## 2014-04-02 DIAGNOSIS — G9340 Encephalopathy, unspecified: Secondary | ICD-10-CM

## 2014-04-02 DIAGNOSIS — R112 Nausea with vomiting, unspecified: Secondary | ICD-10-CM

## 2014-04-02 LAB — COMPREHENSIVE METABOLIC PANEL
ALK PHOS: 64 U/L
Albumin: 2.4 g/dL — ABNORMAL LOW (ref 3.4–5.0)
Anion Gap: 9 (ref 7–16)
BILIRUBIN TOTAL: 0.4 mg/dL (ref 0.2–1.0)
BUN: 46 mg/dL — ABNORMAL HIGH (ref 7–18)
CHLORIDE: 102 mmol/L (ref 98–107)
CO2: 26 mmol/L (ref 21–32)
CREATININE: 4.48 mg/dL — AB (ref 0.60–1.30)
Calcium, Total: 11 mg/dL — ABNORMAL HIGH (ref 8.5–10.1)
EGFR (African American): 10 — ABNORMAL LOW
GFR CALC NON AF AMER: 9 — AB
Glucose: 139 mg/dL — ABNORMAL HIGH (ref 65–99)
Osmolality: 288 (ref 275–301)
Potassium: 3.4 mmol/L — ABNORMAL LOW (ref 3.5–5.1)
SGOT(AST): 69 U/L — ABNORMAL HIGH (ref 15–37)
SGPT (ALT): 28 U/L (ref 12–78)
Sodium: 137 mmol/L (ref 136–145)
Total Protein: 6.6 g/dL (ref 6.4–8.2)

## 2014-04-02 LAB — CBC
HCT: 28.4 % — ABNORMAL LOW (ref 35.0–47.0)
HGB: 9.4 g/dL — ABNORMAL LOW (ref 12.0–16.0)
MCH: 28.2 pg (ref 26.0–34.0)
MCHC: 33.1 g/dL (ref 32.0–36.0)
MCV: 85 fL (ref 80–100)
Platelet: 179 10*3/uL (ref 150–440)
RBC: 3.33 10*6/uL — AB (ref 3.80–5.20)
RDW: 16.5 % — ABNORMAL HIGH (ref 11.5–14.5)
WBC: 8.3 10*3/uL (ref 3.6–11.0)

## 2014-04-03 ENCOUNTER — Encounter (HOSPITAL_COMMUNITY): Payer: Self-pay | Admitting: Emergency Medicine

## 2014-04-03 ENCOUNTER — Emergency Department (HOSPITAL_COMMUNITY): Payer: Medicare HMO

## 2014-04-03 ENCOUNTER — Inpatient Hospital Stay (HOSPITAL_COMMUNITY)
Admission: EM | Admit: 2014-04-03 | Discharge: 2014-05-19 | DRG: 682 | Disposition: E | Payer: Medicare HMO | Attending: Internal Medicine | Admitting: Internal Medicine

## 2014-04-03 DIAGNOSIS — E875 Hyperkalemia: Secondary | ICD-10-CM | POA: Diagnosis not present

## 2014-04-03 DIAGNOSIS — Z66 Do not resuscitate: Secondary | ICD-10-CM | POA: Diagnosis not present

## 2014-04-03 DIAGNOSIS — G931 Anoxic brain damage, not elsewhere classified: Secondary | ICD-10-CM | POA: Diagnosis not present

## 2014-04-03 DIAGNOSIS — M869 Osteomyelitis, unspecified: Secondary | ICD-10-CM | POA: Diagnosis present

## 2014-04-03 DIAGNOSIS — E7800 Pure hypercholesterolemia, unspecified: Secondary | ICD-10-CM | POA: Diagnosis present

## 2014-04-03 DIAGNOSIS — I469 Cardiac arrest, cause unspecified: Secondary | ICD-10-CM | POA: Diagnosis not present

## 2014-04-03 DIAGNOSIS — N189 Chronic kidney disease, unspecified: Secondary | ICD-10-CM | POA: Diagnosis present

## 2014-04-03 DIAGNOSIS — E876 Hypokalemia: Secondary | ICD-10-CM | POA: Diagnosis present

## 2014-04-03 DIAGNOSIS — E872 Acidosis, unspecified: Secondary | ICD-10-CM | POA: Diagnosis present

## 2014-04-03 DIAGNOSIS — G40401 Other generalized epilepsy and epileptic syndromes, not intractable, with status epilepticus: Secondary | ICD-10-CM | POA: Diagnosis not present

## 2014-04-03 DIAGNOSIS — E46 Unspecified protein-calorie malnutrition: Secondary | ICD-10-CM | POA: Diagnosis present

## 2014-04-03 DIAGNOSIS — R4182 Altered mental status, unspecified: Secondary | ICD-10-CM

## 2014-04-03 DIAGNOSIS — J9600 Acute respiratory failure, unspecified whether with hypoxia or hypercapnia: Secondary | ICD-10-CM | POA: Diagnosis not present

## 2014-04-03 DIAGNOSIS — E870 Hyperosmolality and hypernatremia: Secondary | ICD-10-CM | POA: Diagnosis not present

## 2014-04-03 DIAGNOSIS — R569 Unspecified convulsions: Secondary | ICD-10-CM | POA: Diagnosis present

## 2014-04-03 DIAGNOSIS — E43 Unspecified severe protein-calorie malnutrition: Secondary | ICD-10-CM | POA: Diagnosis present

## 2014-04-03 DIAGNOSIS — R32 Unspecified urinary incontinence: Secondary | ICD-10-CM | POA: Diagnosis present

## 2014-04-03 DIAGNOSIS — T361X5A Adverse effect of cephalosporins and other beta-lactam antibiotics, initial encounter: Secondary | ICD-10-CM | POA: Diagnosis present

## 2014-04-03 DIAGNOSIS — J8410 Pulmonary fibrosis, unspecified: Secondary | ICD-10-CM | POA: Diagnosis present

## 2014-04-03 DIAGNOSIS — Z8249 Family history of ischemic heart disease and other diseases of the circulatory system: Secondary | ICD-10-CM

## 2014-04-03 DIAGNOSIS — M4624 Osteomyelitis of vertebra, thoracic region: Secondary | ICD-10-CM

## 2014-04-03 DIAGNOSIS — Z6841 Body Mass Index (BMI) 40.0 and over, adult: Secondary | ICD-10-CM

## 2014-04-03 DIAGNOSIS — Z515 Encounter for palliative care: Secondary | ICD-10-CM

## 2014-04-03 DIAGNOSIS — M79609 Pain in unspecified limb: Secondary | ICD-10-CM | POA: Diagnosis not present

## 2014-04-03 DIAGNOSIS — E785 Hyperlipidemia, unspecified: Secondary | ICD-10-CM | POA: Diagnosis present

## 2014-04-03 DIAGNOSIS — M6282 Rhabdomyolysis: Secondary | ICD-10-CM | POA: Diagnosis present

## 2014-04-03 DIAGNOSIS — M4644 Discitis, unspecified, thoracic region: Secondary | ICD-10-CM | POA: Diagnosis present

## 2014-04-03 DIAGNOSIS — N184 Chronic kidney disease, stage 4 (severe): Secondary | ICD-10-CM | POA: Diagnosis present

## 2014-04-03 DIAGNOSIS — R0989 Other specified symptoms and signs involving the circulatory and respiratory systems: Secondary | ICD-10-CM | POA: Diagnosis not present

## 2014-04-03 DIAGNOSIS — M519 Unspecified thoracic, thoracolumbar and lumbosacral intervertebral disc disorder: Secondary | ICD-10-CM | POA: Diagnosis present

## 2014-04-03 DIAGNOSIS — G9340 Encephalopathy, unspecified: Secondary | ICD-10-CM | POA: Diagnosis present

## 2014-04-03 DIAGNOSIS — M9080 Osteopathy in diseases classified elsewhere, unspecified site: Secondary | ICD-10-CM | POA: Diagnosis present

## 2014-04-03 DIAGNOSIS — G4733 Obstructive sleep apnea (adult) (pediatric): Secondary | ICD-10-CM | POA: Diagnosis present

## 2014-04-03 DIAGNOSIS — D649 Anemia, unspecified: Secondary | ICD-10-CM | POA: Diagnosis present

## 2014-04-03 DIAGNOSIS — N179 Acute kidney failure, unspecified: Principal | ICD-10-CM | POA: Diagnosis present

## 2014-04-03 DIAGNOSIS — D638 Anemia in other chronic diseases classified elsewhere: Secondary | ICD-10-CM | POA: Diagnosis present

## 2014-04-03 DIAGNOSIS — G929 Unspecified toxic encephalopathy: Secondary | ICD-10-CM | POA: Diagnosis present

## 2014-04-03 DIAGNOSIS — I509 Heart failure, unspecified: Secondary | ICD-10-CM | POA: Diagnosis present

## 2014-04-03 DIAGNOSIS — R748 Abnormal levels of other serum enzymes: Secondary | ICD-10-CM | POA: Diagnosis present

## 2014-04-03 DIAGNOSIS — I5033 Acute on chronic diastolic (congestive) heart failure: Secondary | ICD-10-CM | POA: Diagnosis present

## 2014-04-03 DIAGNOSIS — N2581 Secondary hyperparathyroidism of renal origin: Secondary | ICD-10-CM | POA: Diagnosis present

## 2014-04-03 DIAGNOSIS — G40901 Epilepsy, unspecified, not intractable, with status epilepticus: Secondary | ICD-10-CM

## 2014-04-03 DIAGNOSIS — I130 Hypertensive heart and chronic kidney disease with heart failure and stage 1 through stage 4 chronic kidney disease, or unspecified chronic kidney disease: Secondary | ICD-10-CM | POA: Diagnosis present

## 2014-04-03 DIAGNOSIS — N390 Urinary tract infection, site not specified: Secondary | ICD-10-CM

## 2014-04-03 DIAGNOSIS — I10 Essential (primary) hypertension: Secondary | ICD-10-CM | POA: Diagnosis present

## 2014-04-03 DIAGNOSIS — R197 Diarrhea, unspecified: Secondary | ICD-10-CM | POA: Diagnosis not present

## 2014-04-03 DIAGNOSIS — N3090 Cystitis, unspecified without hematuria: Secondary | ICD-10-CM | POA: Diagnosis present

## 2014-04-03 DIAGNOSIS — Z87891 Personal history of nicotine dependence: Secondary | ICD-10-CM

## 2014-04-03 DIAGNOSIS — K859 Acute pancreatitis without necrosis or infection, unspecified: Secondary | ICD-10-CM | POA: Diagnosis present

## 2014-04-03 DIAGNOSIS — E1169 Type 2 diabetes mellitus with other specified complication: Secondary | ICD-10-CM | POA: Diagnosis present

## 2014-04-03 DIAGNOSIS — E8809 Other disorders of plasma-protein metabolism, not elsewhere classified: Secondary | ICD-10-CM | POA: Diagnosis not present

## 2014-04-03 DIAGNOSIS — E861 Hypovolemia: Secondary | ICD-10-CM | POA: Diagnosis not present

## 2014-04-03 DIAGNOSIS — G92 Toxic encephalopathy: Secondary | ICD-10-CM | POA: Diagnosis present

## 2014-04-03 DIAGNOSIS — K5900 Constipation, unspecified: Secondary | ICD-10-CM | POA: Diagnosis not present

## 2014-04-03 LAB — COMPREHENSIVE METABOLIC PANEL
ALT: 29 U/L (ref 0–35)
AST: 71 U/L — ABNORMAL HIGH (ref 0–37)
Albumin: 2.4 g/dL — ABNORMAL LOW (ref 3.5–5.2)
Alkaline Phosphatase: 64 U/L (ref 39–117)
BILIRUBIN TOTAL: 0.3 mg/dL (ref 0.3–1.2)
BUN: 46 mg/dL — AB (ref 6–23)
CHLORIDE: 103 meq/L (ref 96–112)
CO2: 21 mEq/L (ref 19–32)
Calcium: 11.6 mg/dL — ABNORMAL HIGH (ref 8.4–10.5)
Creatinine, Ser: 4.29 mg/dL — ABNORMAL HIGH (ref 0.50–1.10)
GFR calc Af Amer: 10 mL/min — ABNORMAL LOW (ref >90)
GFR calc non Af Amer: 9 mL/min — ABNORMAL LOW (ref >90)
Glucose, Bld: 93 mg/dL (ref 70–99)
Potassium: 3.6 mEq/L — ABNORMAL LOW (ref 3.7–5.3)
Sodium: 142 mEq/L (ref 137–147)
Total Protein: 7 g/dL (ref 6.0–8.3)

## 2014-04-03 LAB — I-STAT CHEM 8, ED
BUN: 41 mg/dL — ABNORMAL HIGH (ref 6–23)
CHLORIDE: 106 meq/L (ref 96–112)
CREATININE: 4.8 mg/dL — AB (ref 0.50–1.10)
Calcium, Ion: 1.49 mmol/L — ABNORMAL HIGH (ref 1.13–1.30)
GLUCOSE: 93 mg/dL (ref 70–99)
HEMATOCRIT: 31 % — AB (ref 36.0–46.0)
Hemoglobin: 10.5 g/dL — ABNORMAL LOW (ref 12.0–15.0)
POTASSIUM: 3.4 meq/L — AB (ref 3.7–5.3)
Sodium: 139 mEq/L (ref 137–147)
TCO2: 21 mmol/L (ref 0–100)

## 2014-04-03 LAB — LIPID PANEL
Cholesterol: 238 mg/dL — ABNORMAL HIGH (ref 0–200)
HDL: 56 mg/dL (ref >39)
LDL Cholesterol: 167 mg/dL — ABNORMAL HIGH (ref 0–99)
Total CHOL/HDL Ratio: 4.3 RATIO
Triglycerides: 77 mg/dL (ref <150)
VLDL: 15 mg/dL (ref 0–40)

## 2014-04-03 LAB — CBC WITH DIFFERENTIAL/PLATELET
BASOS ABS: 0.1 10*3/uL (ref 0.0–0.1)
Basophils Relative: 1 % (ref 0–1)
Eosinophils Absolute: 0.3 10*3/uL (ref 0.0–0.7)
Eosinophils Relative: 3 % (ref 0–5)
HEMATOCRIT: 29.9 % — AB (ref 36.0–46.0)
Hemoglobin: 9.8 g/dL — ABNORMAL LOW (ref 12.0–15.0)
LYMPHS PCT: 24 % (ref 12–46)
Lymphs Abs: 2.2 10*3/uL (ref 0.7–4.0)
MCH: 27.8 pg (ref 26.0–34.0)
MCHC: 32.8 g/dL (ref 30.0–36.0)
MCV: 84.7 fL (ref 78.0–100.0)
MONOS PCT: 9 % (ref 3–12)
Monocytes Absolute: 0.8 10*3/uL (ref 0.1–1.0)
Neutro Abs: 5.9 10*3/uL (ref 1.7–7.7)
Neutrophils Relative %: 63 % (ref 43–77)
PLATELETS: 203 10*3/uL (ref 150–400)
RBC: 3.53 MIL/uL — ABNORMAL LOW (ref 3.87–5.11)
RDW: 16.8 % — ABNORMAL HIGH (ref 11.5–15.5)
WBC: 9.3 10*3/uL (ref 4.0–10.5)

## 2014-04-03 LAB — APTT: aPTT: 37 seconds (ref 24–37)

## 2014-04-03 LAB — URINALYSIS, ROUTINE W REFLEX MICROSCOPIC
Bilirubin Urine: NEGATIVE
GLUCOSE, UA: NEGATIVE mg/dL
Ketones, ur: 15 mg/dL — AB
Nitrite: NEGATIVE
PROTEIN: 100 mg/dL — AB
Specific Gravity, Urine: 1.014 (ref 1.005–1.030)
UROBILINOGEN UA: 0.2 mg/dL (ref 0.0–1.0)
pH: 6.5 (ref 5.0–8.0)

## 2014-04-03 LAB — URINE MICROSCOPIC-ADD ON

## 2014-04-03 LAB — AMMONIA: Ammonia: 21 umol/L (ref 11–60)

## 2014-04-03 LAB — TSH: TSH: 1.05 u[IU]/mL (ref 0.350–4.500)

## 2014-04-03 LAB — MAGNESIUM: Magnesium: 2.2 mg/dL (ref 1.5–2.5)

## 2014-04-03 LAB — CBG MONITORING, ED: Glucose-Capillary: 100 mg/dL — ABNORMAL HIGH (ref 70–99)

## 2014-04-03 LAB — GLUCOSE, CAPILLARY
Glucose-Capillary: 79 mg/dL (ref 70–99)
Glucose-Capillary: 70 mg/dL (ref 70–99)

## 2014-04-03 LAB — I-STAT TROPONIN, ED: TROPONIN I, POC: 0.08 ng/mL (ref 0.00–0.08)

## 2014-04-03 LAB — PROTIME-INR
INR: 1.33 (ref 0.00–1.49)
Prothrombin Time: 16.2 seconds — ABNORMAL HIGH (ref 11.6–15.2)

## 2014-04-03 LAB — LIPASE, BLOOD: Lipase: 1219 U/L — ABNORMAL HIGH (ref 11–59)

## 2014-04-03 LAB — CK: CK TOTAL: 584 U/L — AB (ref 7–177)

## 2014-04-03 LAB — PHOSPHORUS: PHOSPHORUS: 3 mg/dL (ref 2.3–4.6)

## 2014-04-03 LAB — I-STAT CG4 LACTIC ACID, ED: Lactic Acid, Venous: 0.64 mmol/L (ref 0.5–2.2)

## 2014-04-03 MED ORDER — SODIUM CHLORIDE 0.9 % IV SOLN
INTRAVENOUS | Status: DC
  Administered 2014-04-03 (×2): via INTRAVENOUS

## 2014-04-03 MED ORDER — HEPARIN SODIUM (PORCINE) 5000 UNIT/ML IJ SOLN
5000.0000 [IU] | Freq: Three times a day (TID) | INTRAMUSCULAR | Status: DC
  Administered 2014-04-03 – 2014-04-11 (×22): 5000 [IU] via SUBCUTANEOUS
  Filled 2014-04-03 (×25): qty 1

## 2014-04-03 MED ORDER — LIDOCAINE 5 % EX PTCH
1.0000 | MEDICATED_PATCH | CUTANEOUS | Status: DC
  Administered 2014-04-04 – 2014-04-12 (×9): 1 via TRANSDERMAL
  Filled 2014-04-03 (×14): qty 1

## 2014-04-03 MED ORDER — SODIUM CHLORIDE 0.9 % IV SOLN: INTRAVENOUS | Status: DC

## 2014-04-03 MED ORDER — SODIUM CHLORIDE 0.9 % IV SOLN
6.0000 mg/kg | INTRAVENOUS | Status: DC
  Filled 2014-04-03: qty 10.61

## 2014-04-03 MED ORDER — DEXTROSE-NACL 5-0.45 % IV SOLN: INTRAVENOUS | Status: DC

## 2014-04-03 MED ORDER — DEXTROSE 5 % IV SOLN
1.0000 g | INTRAVENOUS | Status: DC
  Filled 2014-04-03: qty 1

## 2014-04-03 MED ORDER — SODIUM CHLORIDE 0.9 % IJ SOLN
10.0000 mL | INTRAMUSCULAR | Status: DC | PRN
  Administered 2014-04-04: 10 mL
  Administered 2014-04-04: 3 mL
  Administered 2014-04-10: 10 mL
  Administered 2014-04-15: 20 mL
  Administered 2014-04-17 – 2014-04-19 (×3): 10 mL

## 2014-04-03 MED ORDER — SODIUM CHLORIDE 0.9 % IV BOLUS (SEPSIS)
500.0000 mL | Freq: Once | INTRAVENOUS | Status: AC
  Administered 2014-04-03: 500 mL via INTRAVENOUS

## 2014-04-03 MED ORDER — IPRATROPIUM-ALBUTEROL 0.5-2.5 (3) MG/3ML IN SOLN: 3.0000 mL | RESPIRATORY_TRACT | Status: DC | PRN

## 2014-04-03 MED ORDER — MORPHINE SULFATE 2 MG/ML IJ SOLN
1.0000 mg | INTRAMUSCULAR | Status: DC | PRN
  Administered 2014-04-03 – 2014-04-04 (×4): 2 mg via INTRAVENOUS
  Administered 2014-04-04: 1 mg via INTRAVENOUS
  Administered 2014-04-05: 2 mg via INTRAVENOUS
  Filled 2014-04-03 (×6): qty 1

## 2014-04-03 MED ORDER — MORPHINE SULFATE 2 MG/ML IJ SOLN
2.0000 mg | Freq: Once | INTRAMUSCULAR | Status: AC
  Administered 2014-04-03: 2 mg via INTRAVENOUS
  Filled 2014-04-03: qty 1

## 2014-04-03 MED ORDER — LIDOCAINE 5 % EX PTCH
1.0000 | MEDICATED_PATCH | CUTANEOUS | Status: DC
  Filled 2014-04-03: qty 1

## 2014-04-03 MED ORDER — CIPROFLOXACIN IN D5W 400 MG/200ML IV SOLN
400.0000 mg | Freq: Once | INTRAVENOUS | Status: AC
  Administered 2014-04-03: 400 mg via INTRAVENOUS
  Filled 2014-04-03: qty 200

## 2014-04-03 MED ORDER — INSULIN ASPART 100 UNIT/ML ~~LOC~~ SOLN: 0.0000 [IU] | Freq: Three times a day (TID) | SUBCUTANEOUS | Status: DC

## 2014-04-03 MED ORDER — ONDANSETRON HCL 4 MG/2ML IJ SOLN
4.0000 mg | Freq: Once | INTRAMUSCULAR | Status: AC
  Administered 2014-04-03: 4 mg via INTRAVENOUS
  Filled 2014-04-03: qty 2

## 2014-04-03 MED ORDER — DEXTROSE-NACL 5-0.9 % IV SOLN
INTRAVENOUS | Status: DC
  Administered 2014-04-03 – 2014-04-04 (×2): via INTRAVENOUS

## 2014-04-03 MED ORDER — DEXTROSE 5 % IV SOLN: 1.0000 g | Freq: Once | INTRAVENOUS | Status: DC

## 2014-04-03 MED ORDER — DEXTROSE 5 % IV SOLN: 2.0000 g | Freq: Two times a day (BID) | INTRAVENOUS | Status: DC

## 2014-04-03 NOTE — ED Notes (Signed)
Patient transported to CT 

## 2014-04-03 NOTE — ED Notes (Signed)
Pt from [facility], sent for eval for aloc. Per [facility] pt also has not eaten since Thursday. Pt mumbling to herself at this time, responds to verbal but incoherent CBG 133 pta

## 2014-04-03 NOTE — ED Provider Notes (Signed)
TIME SEEN: 10:35 AM  CHIEF COMPLAINT: Altered mental status  HPI: Patient is an 80 year old female with a history of hypertension, hyperlipidemia, diabetes, CHF, chronic kidney disease, pulmonary fibrosis, discitis at T10-T11 that was diagnosed in January 2015 which is currently being treated with cefepime and daptomycin and is being followed by Dr. Tommy Medal with infectious disease who presents emergency department with altered mental status. History is provided by patient's daughter as the patient is unable to answer questions. Patient's daughter reports the patient is normally able to carry conversation, perform her activities of daily living without difficulty or assistance and is oriented x3. Since Thursday, 2 days ago she has been slowly declining. They report she has not been eating and drinking well. No history of fever. She has a right upper extremity PICC line that was placed 3 weeks ago. No history of trauma. She is not on anticoagulation. No new changes in medication.  ROS: Unobtainable secondary to patient's altered mental status  PAST MEDICAL HISTORY/PAST SURGICAL HISTORY:  Past Medical History  Diagnosis Date  . Hypertension   . Gout   . Hypercholesteremia   . Diabetes mellitus   . CHF (congestive heart failure)   . Renal disorder   . Cancer   . Edema   . Chronic diastolic CHF (congestive heart failure), NYHA class 2 12/27/2013  . Hypertensive heart disease 12/28/2013  . Pleural effusion on right 12/13/2013  . HCAP (healthcare-associated pneumonia) 12/22/2013  . Osteomyelitis of thoracic spine 11/29/2013  . Discitis of thoracic region 11/29/2013  . CKD (chronic kidney disease) stage 4, GFR 15-29 ml/min 11/20/2013  . Hyperparathyroidism 01/10/2014  . Generalized weakness 11/30/2013    MEDICATIONS:  Prior to Admission medications   Medication Sig Start Date End Date Taking? Authorizing Provider  acetaminophen (TYLENOL) 325 MG tablet Take 650 mg by mouth every 4 (four) hours as needed  for fever. *takes if temperature over 99.5*   Yes Historical Provider, MD  carvedilol (COREG) 25 MG tablet Take 1 tablet (25 mg total) by mouth 2 (two) times daily with a meal. 12/21/13  Yes Cherene Altes, MD  ceFEPIme 2 g in dextrose 5 % 50 mL Inject 2 g into the vein every 12 (twelve) hours. 03/17/14  Yes Truman Hayward, MD  cloNIDine (CATAPRES) 0.1 MG tablet Take 0.1 mg by mouth 2 (two) times daily.   Yes Historical Provider, MD  DAPTOmycin (CUBICIN) 500 MG injection Inject 10.8 mLs (540 mg total) into the vein every other day. 03/17/14  Yes Truman Hayward, MD  DULoxetine (CYMBALTA) 60 MG capsule Take 60 mg by mouth daily.   Yes Historical Provider, MD  feeding supplement, ENSURE, (ENSURE) PUDG Take 1 Container by mouth 3 (three) times daily between meals. 01/01/14  Yes Barton Dubois, MD  hydrALAZINE (APRESOLINE) 50 MG tablet Take 50 mg by mouth 4 (four) times daily.   Yes Historical Provider, MD  HYDROcodone-acetaminophen (NORCO) 10-325 MG per tablet Take 1 tablet by mouth every 6 (six) hours as needed for moderate pain. 01/01/14  Yes Barton Dubois, MD  isosorbide mononitrate (IMDUR) 15 mg TB24 24 hr tablet Take 0.5 tablets (15 mg total) by mouth daily. 01/01/14  Yes Barton Dubois, MD  lidocaine (LIDODERM) 5 % Place 1 patch onto the skin daily. Remove & Discard patch within 12 hours or as directed by MD   Yes Historical Provider, MD  Multiple Vitamins-Minerals (MULTIVITAMIN WITH MINERALS) tablet Take 1 tablet by mouth daily.   Yes Historical Provider, MD  ondansetron (ZOFRAN) 4 MG tablet Take 4 mg by mouth every 8 (eight) hours as needed for nausea.  02/05/14  Yes Historical Provider, MD  senna (SENOKOT) 8.6 MG TABS tablet Take 2 tablets by mouth daily as needed.    Yes Historical Provider, MD  torsemide (DEMADEX) 20 MG tablet Take 1 tablet (20 mg total) by mouth daily. 02/13/14  Yes Gerlene Fee, NP  vitamin C (ASCORBIC ACID) 500 MG tablet Take 500 mg by mouth daily.   Yes Historical  Provider, MD  ipratropium-albuterol (DUONEB) 0.5-2.5 (3) MG/3ML SOLN Take 3 mLs by nebulization every 2 (two) hours as needed (shortness of breath).    Historical Provider, MD    ALLERGIES:  Allergies  Allergen Reactions  . Motrin [Ibuprofen] Hives    SOCIAL HISTORY:  History  Substance Use Topics  . Smoking status: Former Smoker -- 0.50 packs/day for 20 years    Types: Cigarettes    Quit date: 12/17/1979  . Smokeless tobacco: Never Used  . Alcohol Use: No    FAMILY HISTORY: Family History  Problem Relation Age of Onset  . Hypertension Mother   . Hypertension Father   . Hypertension Daughter     EXAM: BP 143/99  Pulse 89  Temp(Src) 96.9 F (36.1 C) (Axillary)  SpO2 100% CONSTITUTIONAL: Alert but unable to answer questions or follow commands, patient is moaning and appears uncomfortable, nontoxic HEAD: Normocephalic EYES: Conjunctivae clear, PERRL ENT: normal nose; no rhinorrhea; moist mucous membranes; pharynx without lesions noted NECK: Supple, no meningismus, no LAD  CARD: RRR; S1 and S2 appreciated; no murmurs, no clicks, no rubs, no gallops RESP: Normal chest excursion without splinting or tachypnea; breath sounds clear and equal bilaterally; no wheezes, no rhonchi, no rales,  ABD/GI: Normal bowel sounds; non-distended; soft, diffusely tender to palpation across abdomen without guarding or rebound, patient mentions loudly when I press her left upper quadrant BACK:  The back appears normal and is non-tender to palpation, there is no CVA tenderness EXT: Normal ROM in all joints; non-tender to palpation; no edema; normal capillary refill; no cyanosis    SKIN: Normal color for age and race; warm NEURO: Moves all extremities equally PSYCH: Grooming and personal hygiene are appropriate.  MEDICAL DECISION MAKING: Patient here with altered mental status and appears to have abdominal pain on exam. Will obtain labs, urine, blood cultures, lactate, EKG and troponin, CT of her  head and abdomen, chest x-ray. Differential diagnosis is large and includes dehydration, infection, stroke, intracranial hemorrhage. Will give small amount of IV fluids. Patient will need admission. Her PCP is Dr. Guinevere Ferrari at [facility].  Patient's daughter Juliann Pulse is at bedside and agrees with this plan.  ED PROGRESS: Patient has a creatinine of 4.8, her baseline is 1.7. She has normal electrolytes and no signs of volume overload. Will give second 500 mg IV fluid bolus. Her CT scan shows possible pancreatitis. Lipase is pending. LFTs are unremarkable. Patient does appear to have a urinary tract infection. Culture is pending. She is currently on cefepime and daptomycin for her discitis. She developed this urinary tract infection while receiving these antibiotics. Her last dose of cefepime was at 6 AM. Will give dose of IV Cipro. We'll discuss with medicine for admission.  1:33 PM Discussed with teaching service for admission to telemetry, and patient. We'll continue IV fluids for her acute on chronic renal failure. Discussed with patient's family at bedside.    EKG Interpretation  Date/Time:  2014/04/20 36:64:40 EDT  Ventricular Rate:  92 PR Interval:  164 QRS Duration: 103 QT Interval:  376 QTC Calculation: 465 R Axis:   23 Text Interpretation:  Sinus rhythm Artifact in lead(s) I II aVR aVL No significant change since last tracing Confirmed by Crespin Forstrom,  DO, Jhovani Griswold ([id number]) on 03/23/2014 10:33:34 AM          [place], DO 03/25/2014 1334

## 2014-04-03 NOTE — Progress Notes (Signed)
ANTIBIOTIC CONSULT NOTE - INITIAL  Pharmacy Consult for Daptomycin / Cefepime Indication: Osteomyelitis  Allergies  Allergen Reactions  . Motrin [Ibuprofen] Hives    Patient Measurements: Height: 5\' 2"  (157.5 cm) Weight: 194 lb 12.8 oz (88.361 kg) IBW/kg (Calculated) : 50.1 Total Body Weight 88.4kg  Vital Signs: Temp: 97.9 F (36.6 C) (05/16 1700) Temp src: Axillary (05/16 1700) BP: 185/95 mmHg (05/16 1700) Pulse Rate: 84 (05/16 1700) Intake/Output from previous day:   Intake/Output from this shift: Total I/O In: 1200 [I.V.:1200] Out: -   Labs:  Recent Labs  04/11/2014 1023 04/13/2014 1034 04/01/2014 1105  WBC  --  9.3  --   HGB  --  9.8* 10.5*  PLT  --  203  --   CREATININE 4.29*  --  4.80*   Estimated Creatinine Clearance: 9.5 ml/min (by C-G formula based on Cr of 4.8). No results found for this basename: VANCOTROUGH, VANCOPEAK, VANCORANDOM, GENTTROUGH, GENTPEAK, GENTRANDOM, TOBRATROUGH, TOBRAPEAK, TOBRARND, AMIKACINPEAK, AMIKACINTROU, AMIKACIN,  in the last 72 hours   Microbiology: No results found for this or any previous visit (from the past 720 hour(s)).  Medical History: Past Medical History  Diagnosis Date  . Hypertension   . Gout   . Hypercholesteremia   . Diabetes mellitus   . CHF (congestive heart failure)   . Renal disorder   . Cancer   . Edema   . Chronic diastolic CHF (congestive heart failure), NYHA class 2 12/27/2013  . Hypertensive heart disease 12/28/2013  . Pleural effusion on right 12/13/2013  . HCAP (healthcare-associated pneumonia) 12/22/2013  . Osteomyelitis of thoracic spine 11/29/2013  . Discitis of thoracic region 11/29/2013  . CKD (chronic kidney disease) stage 4, GFR 15-29 ml/min 11/20/2013  . Hyperparathyroidism 01/10/2014  . Generalized weakness 11/30/2013     Assessment: [name] [name] admitted from SNF with altered mental status.  She has been treated with cefepime and daptomycin by Dr. Tommy Medal, as an outpatient for osteomyelitis / discitis.   She has been afebrile, wbc wnl but acute renal failure Cr 4.8 (baseline 1.8 2weeks ago).  Per her daughter, she has already received a dose of cefepime today and she received her dose of daptomycin last pm.   Cefepime and Daptomycin will be continued but doses will be adjusted for renal dysfunction.     Plan:  Cefepime 1gm q24hr - start 5/17 Daptomycin 6mg /kg = 530mg  q48hr start 5/17  Bonnita Nasuti Pharm.D. CPP, BCPS Clinical Pharmacist [phone] 03/23/2014 6:25 PM

## 2014-04-03 NOTE — H&P (Signed)
Date: 03/27/2014               Patient Name:  [name] [name] MRN: [id number]  DOB: 1930/07/19 Age / Sex: 80 y.o., female   PCP: No Pcp Per Patient         Medical Service: Internal Medicine Teaching Service         Attending Physician: Dr. Madilyn Fireman, MD    First Contact: Dr. Naaman Plummer  Pager: [phone]  Second Contact: Dr. Aundra Dubin  Pager: [phone]       After Hours (After 5p/  First Contact Pager: [phone]  weekends / holidays): Second Contact Pager: [phone]   Chief Complaint: Altered mental status  History of Present Illness:   [name] [name]. [place] is a 80 year old female with past medical history of hypertension, hyperlipidemia, non-insulin dependent Type II DM, grade 1 dCHF (last 12/17/13),  CKD Stage 4, and T10-11 osteomyelitis in Jan 2015 on antibiotic therapy who presents from [facility] facility with altered mental status of 2 day duration.   Per daughter her mother has had poor PO intake, decreased appetite, and nausea since being on long duration antibiotic therapy (both IV and PO)  for osteomyelitis/diskitis since January of this year with weight loss of about 11 lbs. Two days ago she became confused, nonverbal, restless, memory loss (unable to recognize her), fever (102.1),  abdominal pain, bilious vomiting, and urinary/bowel incontinence. She has had not had PO intake and no medications since yesterday.and had muscle twitching without seizure activity.     At baseline she is very social, verbal, alert and orientated x3, and able to converse normally. No history of dementia. She has been on 2-3 L O2 at home since recent hospitalization early this year.   Meds: Current Facility-Administered Medications  Medication Dose Route Frequency Provider Last Rate Last Dose  . 0.9 %  sodium chloride infusion   Intravenous Continuous Cresenciano Genre, MD 75 mL/hr at 03/27/2014 1613    . dextrose 5 %-0.45 % sodium chloride infusion   Intravenous Continuous Cresenciano Genre, MD      .  heparin injection 5,000 Units  5,000 Units Subcutaneous 3 times per day Cresenciano Genre, MD      . ipratropium-albuterol (DUONEB) 0.5-2.5 (3) MG/3ML nebulizer solution 3 mL  3 mL Nebulization Q2H PRN Cresenciano Genre, MD      . lidocaine (LIDODERM) 5 % 1 patch  1 patch Transdermal Q24H Cresenciano Genre, MD      . morphine 2 MG/ML injection 1-2 mg  1-2 mg Intravenous Q4H PRN Cresenciano Genre, MD        Allergies: Allergies as of 03/20/2014 - Review Complete 03/19/2014  Allergen Reaction Noted  . Motrin [ibuprofen] Hives 04/08/2012   Past Medical History  Diagnosis Date  . Hypertension   . Gout   . Hypercholesteremia   . Diabetes mellitus   . CHF (congestive heart failure)   . Renal disorder   . Cancer   . Edema   . Chronic diastolic CHF (congestive heart failure), NYHA class 2 12/27/2013  . Hypertensive heart disease 12/28/2013  . Pleural effusion on right 12/13/2013  . HCAP (healthcare-associated pneumonia) 12/22/2013  . Osteomyelitis of thoracic spine 11/29/2013  . Discitis of thoracic region 11/29/2013  . CKD (chronic kidney disease) stage 4, GFR 15-29 ml/min 11/20/2013  . Hyperparathyroidism 01/10/2014  . Generalized weakness 11/30/2013   Past Surgical History  Procedure Laterality Date  . Cesarean section    . Rotator  cuff repair    . Tonsillectomy    . Tubal ligation     Family History  Problem Relation Age of Onset  . Hypertension Mother   . Hypertension Father   . Hypertension Daughter    History   Social History  . Marital Status: Divorced    Spouse Name: N/A    Number of Children: N/A  . Years of Education: N/A   Occupational History  . 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 Smoking status: Former Smoker -- 0.50 packs/day for 20 years    Types: Cigarettes    Quit date: 12/17/1979  . Smokeless tobacco: Never Used  . Alcohol Use: No  . Drug Use: No  . Sexual Activity: Not on file   Other Topics Concern  . Not on file   Social History Narrative   . No narrative on file    Review of Systems: Review of Systems  Unable to perform ROS   hysical Exam: Blood pressure 185/95, pulse 84, temperature 97.9 F (36.6 C), temperature source Axillary, resp. rate 18, height 5\' 2"  (1.575 m), weight 194 lb 12.8 oz (88.361 kg), SpO2 100.00%. Physical Exam  Constitutional: She appears well-developed and well-nourished. No distress.  Moaning   HENT:  Head: Normocephalic and atraumatic.  Right Ear: External ear normal.  Left Ear: External ear normal.  Eyes: Pupils are equal, round, and reactive to light.  Neck: Normal range of motion. Neck supple.  Cardiovascular: Normal rate and regular rhythm.   Pulmonary/Chest: Effort normal and breath sounds normal. No respiratory distress. She has no wheezes. She has no rales.  Currently on 2L   Abdominal: Soft. There is tenderness (epigastric, LUQ ). There is no rebound and no guarding.  Minimal BS  Neurological:  Non-verbal, unable to follow commands, occasionally opens eyes, moving extremities   Skin: Skin is warm and dry. No rash noted. She is not diaphoretic. No erythema. No pallor.     Lab results: Basic Metabolic Panel:  Recent Labs  03/30/2014 1023 04/14/2014 1105  NA 142 139  K 3.6* 3.4*  CL 103 106  CO2 21  --   GLUCOSE 93 93  BUN 46* 41*  CREATININE 4.29* 4.80*  CALCIUM 11.6*  --    Liver Function Tests:  Recent Labs  04/16/2014 1023  AST 71*  ALT 29  ALKPHOS 64  BILITOT 0.3  PROT 7.0  ALBUMIN 2.4*    Recent Labs  04/14/2014 1023  LIPASE 1219*    CBC:  Recent Labs  03/20/2014 1034 04/18/2014 1105  WBC 9.3  --   NEUTROABS 5.9  --   HGB 9.8* 10.5*  HCT 29.9* 31.0*  MCV 84.7  --   PLT 203  --    CBG:  Recent Labs  04/04/2014 1348 03/28/2014 1627  GLUCAP 100* 79    Urinalysis:  Recent Labs  04/08/2014 1053  COLORURINE YELLOW  LABSPEC 1.014  PHURINE 6.5  GLUCOSEU NEGATIVE  HGBUR MODERATE*  BILIRUBINUR NEGATIVE  KETONESUR 15*  PROTEINUR 100*   UROBILINOGEN 0.2  NITRITE NEGATIVE  LEUKOCYTESUR LARGE*     Imaging results:  Ct Abdomen Pelvis Wo Contrast  04/13/2014   CLINICAL DATA:  Altered level of consciousness  EXAM: CT ABDOMEN AND PELVIS WITHOUT CONTRAST  TECHNIQUE: Multidetector CT imaging of the abdomen and pelvis was performed following the standard protocol without IV contrast.  COMPARISON:  None.  FINDINGS: Patchy bibasilar pulmonary opacities are compatible with early airspace disease or  scattered subsegmental atelectasis.  Motion artifact severely degrades exam.  Postcholecystectomy.  Small calcified granulomata in the liver.  Spleen is within normal limits.  Adrenal glands are unremarkable.  The pancreas is ill-defined. There is stranding about the head of the pancreas some blurring of the fat planes. Inflammatory changes are not excluded. Trace free fluid in the right pericolic gutter.  No hydronephrosis.  Simple cysts in the kidneys.  Bladder is distended.  Uterus and adnexa are within normal limits.  The destructive process at T10-T11 secondary to osteomyelitis and discitis is again noted. Spinal stenosis at this level is re- demonstrated.  IMPRESSION: Limited exam secondary to motion artifact  Findings suggest pancreatitis. Correlate with amylase and lipase levels.  Bibasilar atelectasis versus airspace disease.   Electronically Signed   By: Maryclare Bean M.D.   On: 03/28/2014 12:02   Ct Head Wo Contrast  04/13/2014   CLINICAL DATA:  Altered level of consciousness  EXAM: CT HEAD WITHOUT CONTRAST  TECHNIQUE: Contiguous axial images were obtained from the base of the skull through the vertex without intravenous contrast.  COMPARISON:  None.  FINDINGS: Global atrophy. Chronic ischemic changes in the periventricular white matter.  Motion artifact severely degrades the study despite multiple attempts at scanning.  Multiple calcifications are seen within the region of the tentorium. These are likely all extra-axial calcifications. This is  difficult to confirm on axial imaging only.  No mass effect, midline shift, or acute intracranial hemorrhage. No obvious skull fracture. Go mucous material is present in the maxillary sinuses.  IMPRESSION: No acute intracranial pathology. Chronic ischemic changes. Motion artifact degrades the study   Electronically Signed   By: Maryclare Bean M.D.   On: 04/15/2014 11:57   Dg Chest Port 1 View  03/28/2014   CLINICAL DATA:  Altered mental status  EXAM: PORTABLE CHEST - 1 VIEW  COMPARISON:  Chest radiograph December 27, 2013 and chest CT January 19, 2014  FINDINGS: There is a central catheter with tip in the superior vena cava near the cavoatrial junction. No pneumothorax. There is mild interstitial edema. Heart is borderline enlarged with normal pulmonary vascularity. No airspace consolidation.  IMPRESSION: Evidence of a degree of congestive heart failure. No consolidation. No pneumothorax.   Electronically Signed   By: Lowella Grip M.D.   On: 04/12/2014 11:21    Other results:  EKG:  Date/Time: Saturday Apr 03 2014 10:28:24 EDT  Ventricular Rate: 92  PR Interval: 164  QRS Duration: 103  QT Interval: 376  QTC Calculation: 465  R Axis: 23  Text Interpretation: Sinus rhythm Artifact in lead(s) I II aVR aVL No significant change since last tracing    Assessment & Plan by Problem:  Acute Encephalopathy - Currently nonverbal and unable to follows commands. CT head with no acute changes. AG 18. Most likely multifactorial due to acute pancreatitis, complicated cystitis, uremia, osteomyelitis, and hypercalcemia. Pt with questionable sarcoidosis. No recent medication changes,  head trauma, seizures, or history of dementia.   -Oxygen therapy to keep SpO2 > 92% -Duoneb Q 2 hr PRN -NPO -D5 NS 125 mL/hr  -Obtain ammonia, TSH, PTH, CK -F/U blood cultures -Continue IV cefepime and daptomycin  -Avoid sedative medications -Hold home cymbalta 60 mg daily  -Hold home norco 10-325 mg Q 6 hr PRN  Acute  Pancreatitis - Pt with reported vomiting and abdominal pain of 2 day duration with elevated lipase 1219 and mildly elvated ALT 71 with CT image findings of ill-defined pancreas with stranding about  the head of  the pancreas and some blurring of the fat planes. Pt is s/p cholecystectomy with no recent alcohol use. Etiology unclear, possibly medication induced vs hypercalcemia.  No prior history of pancreatitis. Pt received 500 cc bolus (x2) in ED.    -NPO -D5 NS 125 mL/hr  -IV morphine 1-2 mg Q 4 hr PRN pain  -Obtain lipid panel   AKI on CKD Stage 4 - Cr 4.29 above baseline 1.9. Etiology likely due to prerenal azotemia in setting of hypovolemia from GI loss (vomiting) vs antibiotics (daptomycin, recent vancomycin use with high trough level 26 on 12/31/13).     -Renal consult (to see pt in AM) -Obtain UNa, UCr,  UUrea to calculate Fe Urea -Urinary eosinophils  -Avoid nephrotoxins -Hold home torsemide 20 mg daily   -Monitor BMP  Complicated Cystitis - UA with large leukocytes, pyuria (21-50), and bacteriuria. Also with moderate Hg. Pt received ciprofloxacin in ED.  Pt with recent urinary incontinence and bladder distension on CT imaging.  -Continue IV cefepime  -F/U urine cultures  -Place foley catheter   Osteomyelitis of T10-T11 - Currently with no sepsis criteria. Diagnosed Jan 2015. Pt has received 2 weeks of IV ceftriaxone and vancomycin; doxycyline and amoxicillin; and now IV cefepime and daptomycin since 4/29 for 6 weeks. CT revealing destructive process at T10-T11 secondary to osteomyelitis and discitis. PICC line placed about 3 weeks ago.  -Continue IV cefepime and daptomycin  -Consider ID consult -Consider re-imaging of thoracic spine  Hypercalcemia in setting of secondary hyperparathyroidism with vitamin insufficiency  - Corrected calcium  12.9. Last PTH with 176.8 (H), vitamin D25-OH 26 (L), 1-25OH 16 (L),  Also questionable sarcoidosis per pulmonology. -D5 NS 125 mL/hr  -Obtain  PTH, 25-OH Vitamin D, phosphorus  -Obtain SPEP, kappa:lamba light chains  Hypertension - Currently hypertensive.  Pt not on home medications since 5/15.  -Clonidine patch weekly (at home on clonidine 0.1 mg BID)  -Hold home carvedilol 25 mg BID  -Hold home torsemide 20 mg daily  -Hold imdur 15 mg daily  -Hold hydralazine 50 mg   Chronic Grade 1 Diastolic CHF - Last 2D -echo on 12/17/13 with EF 60-65% and grade 1 diastolic dysfunction. CXR with mild interstitial edema. Pt appears hypovolemic on exam.  -Hold home torsemide 20 mg daily  -Hold home imdur 15 mg daily  -Monitor daily weights and strict I & O's  Chronic Normocytic Anemia - Pt with Hg 9.8 on admission above baseline 9. No recent anemia panel.  -Monitor for bleeding -Monitor CBC -Obtain anemia panel  -Transfuse if Hg <7  Non-insulin dependent Type II DM - Last A1c of 6.8 of 11/21/13. Pt does not appear to be on medications at home.  -Obtain A1c  -Sensitive SSI  -CBG monitoring before meals & at bedtime  Diet: NPO DVT Ppx: SQ heparin TID Code: Full   Dispo: Disposition is deferred at this time, awaiting improvement of current medical problems.    The patient does have a current PCP (No Pcp Per Patient) and does need an [facility] hospital follow-up appointment after discharge.  The patient does have transportation limitations that hinder transportation to clinic appointments.  Signed: Juluis Mire, MD 03/19/2014, 6:04 PM

## 2014-04-04 ENCOUNTER — Inpatient Hospital Stay (HOSPITAL_COMMUNITY): Payer: Medicare HMO

## 2014-04-04 ENCOUNTER — Encounter: Payer: Self-pay | Admitting: Adult Health

## 2014-04-04 DIAGNOSIS — G9340 Encephalopathy, unspecified: Secondary | ICD-10-CM

## 2014-04-04 DIAGNOSIS — N179 Acute kidney failure, unspecified: Principal | ICD-10-CM

## 2014-04-04 DIAGNOSIS — R112 Nausea with vomiting, unspecified: Secondary | ICD-10-CM | POA: Insufficient documentation

## 2014-04-04 DIAGNOSIS — K859 Acute pancreatitis without necrosis or infection, unspecified: Secondary | ICD-10-CM

## 2014-04-04 DIAGNOSIS — N189 Chronic kidney disease, unspecified: Secondary | ICD-10-CM

## 2014-04-04 DIAGNOSIS — I5032 Chronic diastolic (congestive) heart failure: Secondary | ICD-10-CM

## 2014-04-04 LAB — MRSA PCR SCREENING: MRSA BY PCR: NEGATIVE

## 2014-04-04 LAB — BASIC METABOLIC PANEL
BUN: 43 mg/dL — AB (ref 6–23)
CO2: 21 mEq/L (ref 19–32)
Calcium: 10.8 mg/dL — ABNORMAL HIGH (ref 8.4–10.5)
Chloride: 107 mEq/L (ref 96–112)
Creatinine, Ser: 4.25 mg/dL — ABNORMAL HIGH (ref 0.50–1.10)
GFR calc Af Amer: 10 mL/min — ABNORMAL LOW (ref >90)
GFR, EST NON AFRICAN AMERICAN: 9 mL/min — AB (ref >90)
Glucose, Bld: 101 mg/dL — ABNORMAL HIGH (ref 70–99)
POTASSIUM: 3.3 meq/L — AB (ref 3.7–5.3)
Sodium: 143 mEq/L (ref 137–147)

## 2014-04-04 LAB — RENAL FUNCTION PANEL
Albumin: 2.2 g/dL — ABNORMAL LOW (ref 3.5–5.2)
BUN: 45 mg/dL — AB (ref 6–23)
CALCIUM: 11 mg/dL — AB (ref 8.4–10.5)
CO2: 22 meq/L (ref 19–32)
Chloride: 106 mEq/L (ref 96–112)
Creatinine, Ser: 4.23 mg/dL — ABNORMAL HIGH (ref 0.50–1.10)
GFR calc non Af Amer: 9 mL/min — ABNORMAL LOW (ref >90)
GFR, EST AFRICAN AMERICAN: 10 mL/min — AB (ref >90)
GLUCOSE: 95 mg/dL (ref 70–99)
Phosphorus: 3 mg/dL (ref 2.3–4.6)
Potassium: 3.1 mEq/L — ABNORMAL LOW (ref 3.7–5.3)
Sodium: 142 mEq/L (ref 137–147)

## 2014-04-04 LAB — CBC WITH DIFFERENTIAL/PLATELET
Basophils Absolute: 0.1 10*3/uL (ref 0.0–0.1)
Basophils Relative: 1 % (ref 0–1)
Eosinophils Absolute: 0.4 10*3/uL (ref 0.0–0.7)
Eosinophils Relative: 4 % (ref 0–5)
HEMATOCRIT: 28.5 % — AB (ref 36.0–46.0)
Hemoglobin: 9.6 g/dL — ABNORMAL LOW (ref 12.0–15.0)
LYMPHS PCT: 16 % (ref 12–46)
Lymphs Abs: 1.6 10*3/uL (ref 0.7–4.0)
MCH: 28.2 pg (ref 26.0–34.0)
MCHC: 33.7 g/dL (ref 30.0–36.0)
MCV: 83.8 fL (ref 78.0–100.0)
MONOS PCT: 15 % — AB (ref 3–12)
Monocytes Absolute: 1.5 10*3/uL — ABNORMAL HIGH (ref 0.1–1.0)
NEUTROS ABS: 6.1 10*3/uL (ref 1.7–7.7)
Neutrophils Relative %: 64 % (ref 43–77)
Platelets: 182 10*3/uL (ref 150–400)
RBC: 3.4 MIL/uL — AB (ref 3.87–5.11)
RDW: 16.7 % — ABNORMAL HIGH (ref 11.5–15.5)
WBC: 9.7 10*3/uL (ref 4.0–10.5)

## 2014-04-04 LAB — VITAMIN B12: VITAMIN B 12: 815 pg/mL (ref 211–911)

## 2014-04-04 LAB — GLUCOSE, CAPILLARY
GLUCOSE-CAPILLARY: 100 mg/dL — AB (ref 70–99)
Glucose-Capillary: 102 mg/dL — ABNORMAL HIGH (ref 70–99)
Glucose-Capillary: 83 mg/dL (ref 70–99)
Glucose-Capillary: 99 mg/dL (ref 70–99)

## 2014-04-04 LAB — RETICULOCYTES
RBC.: 3.37 MIL/uL — AB (ref 3.87–5.11)
Retic Count, Absolute: 70.8 10*3/uL (ref 19.0–186.0)
Retic Ct Pct: 2.1 % (ref 0.4–3.1)

## 2014-04-04 LAB — PRO B NATRIURETIC PEPTIDE: PRO B NATRI PEPTIDE: 6433 pg/mL — AB (ref 0–450)

## 2014-04-04 LAB — CREATININE, URINE, RANDOM: CREATININE, URINE: 59.41 mg/dL

## 2014-04-04 LAB — IRON AND TIBC
IRON: 33 ug/dL — AB (ref 42–135)
SATURATION RATIOS: 24 % (ref 20–55)
TIBC: 135 ug/dL — AB (ref 250–470)
UIBC: 102 ug/dL — ABNORMAL LOW (ref 125–400)

## 2014-04-04 LAB — TROPONIN I: Troponin I: <0.3 ng/mL (ref <0.30)

## 2014-04-04 LAB — FERRITIN: Ferritin: 253 ng/mL (ref 10–291)

## 2014-04-04 LAB — CK: Total CK: 621 U/L — ABNORMAL HIGH (ref 7–177)

## 2014-04-04 LAB — SEDIMENTATION RATE: Sed Rate: 103 mm/hr — ABNORMAL HIGH (ref 0–22)

## 2014-04-04 LAB — HEMOGLOBIN A1C
Hgb A1c MFr Bld: 6 % — ABNORMAL HIGH (ref <5.7)
Mean Plasma Glucose: 126 mg/dL — ABNORMAL HIGH (ref <117)

## 2014-04-04 LAB — SODIUM, URINE, RANDOM: SODIUM UR: 79 meq/L

## 2014-04-04 LAB — FOLATE

## 2014-04-04 LAB — C-REACTIVE PROTEIN: CRP: 2.9 mg/dL — ABNORMAL HIGH (ref <0.60)

## 2014-04-04 MED ORDER — POTASSIUM CHLORIDE 10 MEQ/100ML IV SOLN
10.0000 meq | INTRAVENOUS | Status: AC
  Administered 2014-04-04 (×3): 10 meq via INTRAVENOUS
  Filled 2014-04-04 (×3): qty 100

## 2014-04-04 MED ORDER — HYDRALAZINE HCL 20 MG/ML IJ SOLN
5.0000 mg | INTRAMUSCULAR | Status: DC | PRN
Start: 2014-04-04 — End: 2014-04-05
  Administered 2014-04-05: 5 mg via INTRAVENOUS
  Filled 2014-04-04: qty 1

## 2014-04-04 MED ORDER — CLONIDINE HCL 0.1 MG/24HR TD PTWK
0.1000 mg | MEDICATED_PATCH | TRANSDERMAL | Status: DC
  Administered 2014-04-04 – 2014-04-18 (×3): 0.1 mg via TRANSDERMAL
  Filled 2014-04-04 (×3): qty 1

## 2014-04-04 MED ORDER — LORAZEPAM 2 MG/ML IJ SOLN
1.0000 mg | Freq: Once | INTRAMUSCULAR | Status: AC
  Administered 2014-04-04: 1 mg via INTRAVENOUS
  Filled 2014-04-04: qty 1

## 2014-04-04 NOTE — Progress Notes (Signed)
PT Cancellation Note  Patient Details Name: [name] [name] MRN: [id number] DOB: June 18, 1930   Cancelled Treatment:    Reason Eval/Treat Not Completed: Medical issues which prohibited therapy  Noted unable to arouse and participate Per RN, transferring to stepdown  Will hold PT eval and reattempt tomorrow  Thank you,  Roney Marion, [place] Pager [phone] Office [place] 04/04/2014, 3:57 PM

## 2014-04-04 NOTE — Progress Notes (Signed)
S:   Called to patient bedside due to BP 219/135. Patient moaning and incoherent. She appeared to be in pain.  O:  Satting well. Patient is arousable to voice. She is able to move her extremities. She is not following commands.   JVP to the angle of the jaw. Mild Rales bil. Trace LE edema.   Reviewed CXR from admission and signs of CHF.  A+P:   Patient appears to have volume overload in the setting of AoCKD and CHF. Fluids were at 150 cc/hr. Plan to discontinue fluids and re-evaluate in 4-5 hours.  Pain: patient has history of discitis that may be worsening and has active pancreatitis. She is moving as if in severe pain. She was at outpatient with percocet q 6 hr. No pain Gave 1 mg IV morphine, as the previous doses had been held due to patients AMS. Patient appeared to become more comfortable after this. See is on no abx due to severely reduced GFR in setting of AKI.  HTN: Ordered hydral IV q 4 hr prn for SBP >200. Plan to treat patients apparent pain with morphine 1-2 mg every 4 hours as needed. Instructed the nurse to exchange clonidine patch as the date of placement of the current patch was unknown. Ordered STAT portable CXR to evaluate for pulmonary edema.  Discontinued IVFs and have night team re-evaluate fluid status in 4-6 hrs.

## 2014-04-04 NOTE — Progress Notes (Signed)
Subjective:  Pt seen and examined in AM. Pt still encephalopathic and unable to follow commands. Sparingly opens eyes and continues to moan.     Objective: Vital signs in last 24 hours: Filed Vitals:   04/02/2014 1608 03/31/2014 1700 03/24/2014 2102 04/04/14 0433  BP:  185/95 163/71 147/54  Pulse:  84 89   Temp:  97.9 F (36.6 C) 98 F (36.7 C) 98 F (36.7 C)  TempSrc:  Axillary Oral Axillary  Resp:  18 18 21   Height: 5' 2"  (1.575 m) 5' 2"  (1.575 m)    Weight: 194 lb 12.8 oz (88.361 kg) 194 lb 12.8 oz (88.361 kg)  200 lb 3.2 oz (90.81 kg)  SpO2:  100% 100% 100%   Weight change:   Intake/Output Summary (Last 24 hours) at 04/04/14 1205 Last data filed at 04/04/14 0953  Gross per 24 hour  Intake 1401.25 ml  Output      0 ml  Net 1401.25 ml   Physical Examination: General: Restless and moaning  Eyes: PERRL Neck: Normal range of motion. Neck supple.  Heart: Normal rate and regular rhythm.  Lungs: Breathing comfortably on 2L [place]. Anterior lung fields clear. No wheezing, rales, or crackles.  Abdominal: Soft, possible epigastric & LUQ abdominal tenderness.  Minimal BS. No rebound or guarding.  Extremities: No edema.  Neurological: Non-verbal, unable to follow commands, occasionally opens eyes, moving extremities    Lab Results: Basic Metabolic Panel:  Recent Labs Lab 03/25/2014 1023 04/12/2014 1105 04/10/2014 1855 04/04/14 0513  NA 142 139  --  142  K 3.6* 3.4*  --  3.1*  CL 103 106  --  106  CO2 21  --   --  22  GLUCOSE 93 93  --  95  BUN 46* 41*  --  45*  CREATININE 4.29* 4.80*  --  4.23*  CALCIUM 11.6*  --   --  11.0*  MG  --   --  2.2  --   PHOS  --   --  3.0 3.0   Liver Function Tests:  Recent Labs Lab 03/30/2014 1023 04/04/14 0513  AST 71*  --   ALT 29  --   ALKPHOS 64  --   BILITOT 0.3  --   PROT 7.0  --   ALBUMIN 2.4* 2.2*    Recent Labs Lab 04/12/2014 1023  LIPASE 1219*    Recent Labs Lab 04/02/2014 1855  AMMONIA 21   CBC:  Recent Labs Lab  04/11/2014 1034 03/21/2014 1105 04/04/14 0513  WBC 9.3  --  9.7  NEUTROABS 5.9  --  6.1  HGB 9.8* 10.5* 9.6*  HCT 29.9* 31.0* 28.5*  MCV 84.7  --  83.8  PLT 203  --  182   Cardiac Enzymes:  Recent Labs Lab 04/10/2014 1855 04/04/14 0513  CKTOTAL 584* 621*  TROPONINI  --  <0.30   CBG:  Recent Labs Lab 03/22/2014 1348 03/31/2014 1627 04/02/2014 2144 04/04/14 0611  GLUCAP 100* 79 70 102*   Hemoglobin A1C:  Recent Labs Lab 03/22/2014 1855  HGBA1C 6.0*   Fasting Lipid Panel:  Recent Labs Lab 03/21/2014 2000  CHOL 238*  HDL 56  LDLCALC 167*  TRIG 77  CHOLHDL 4.3   Thyroid Function Tests:  Recent Labs Lab 04/01/2014 1855  TSH 1.050   Coagulation:  Recent Labs Lab 03/24/2014 1855  LABPROT 16.2*  INR 1.33   Anemia Panel:  Recent Labs Lab 04/04/14 0513  RETICCTPCT 2.1   Urinalysis:  Recent  Labs Lab 04/16/2014 1053  COLORURINE YELLOW  LABSPEC 1.014  PHURINE 6.5  GLUCOSEU NEGATIVE  HGBUR MODERATE*  BILIRUBINUR NEGATIVE  KETONESUR 15*  PROTEINUR 100*  UROBILINOGEN 0.2  NITRITE NEGATIVE  LEUKOCYTESUR LARGE*     Studies/Results: Ct Abdomen Pelvis Wo Contrast  04/16/2014   CLINICAL DATA:  Altered level of consciousness  EXAM: CT ABDOMEN AND PELVIS WITHOUT CONTRAST  TECHNIQUE: Multidetector CT imaging of the abdomen and pelvis was performed following the standard protocol without IV contrast.  COMPARISON:  None.  FINDINGS: Patchy bibasilar pulmonary opacities are compatible with early airspace disease or scattered subsegmental atelectasis.  Motion artifact severely degrades exam.  Postcholecystectomy.  Small calcified granulomata in the liver.  Spleen is within normal limits.  Adrenal glands are unremarkable.  The pancreas is ill-defined. There is stranding about the head of the pancreas some blurring of the fat planes. Inflammatory changes are not excluded. Trace free fluid in the right pericolic gutter.  No hydronephrosis.  Simple cysts in the kidneys.  Bladder is  distended.  Uterus and adnexa are within normal limits.  The destructive process at T10-T11 secondary to osteomyelitis and discitis is again noted. Spinal stenosis at this level is re- demonstrated.  IMPRESSION: Limited exam secondary to motion artifact  Findings suggest pancreatitis. Correlate with amylase and lipase levels.  Bibasilar atelectasis versus airspace disease.   Electronically Signed   By: Maryclare Bean M.D.   On: 03/20/2014 12:02   Ct Head Wo Contrast  03/28/2014   CLINICAL DATA:  Altered level of consciousness  EXAM: CT HEAD WITHOUT CONTRAST  TECHNIQUE: Contiguous axial images were obtained from the base of the skull through the vertex without intravenous contrast.  COMPARISON:  None.  FINDINGS: Global atrophy. Chronic ischemic changes in the periventricular white matter.  Motion artifact severely degrades the study despite multiple attempts at scanning.  Multiple calcifications are seen within the region of the tentorium. These are likely all extra-axial calcifications. This is difficult to confirm on axial imaging only.  No mass effect, midline shift, or acute intracranial hemorrhage. No obvious skull fracture. Go mucous material is present in the maxillary sinuses.  IMPRESSION: No acute intracranial pathology. Chronic ischemic changes. Motion artifact degrades the study   Electronically Signed   By: Maryclare Bean M.D.   On: 04/18/2014 11:57   Dg Chest Port 1 View  03/22/2014   CLINICAL DATA:  Altered mental status  EXAM: PORTABLE CHEST - 1 VIEW  COMPARISON:  Chest radiograph December 27, 2013 and chest CT January 19, 2014  FINDINGS: There is a central catheter with tip in the superior vena cava near the cavoatrial junction. No pneumothorax. There is mild interstitial edema. Heart is borderline enlarged with normal pulmonary vascularity. No airspace consolidation.  IMPRESSION: Evidence of a degree of congestive heart failure. No consolidation. No pneumothorax.   Electronically Signed   By: Lowella Grip M.D.   On: 03/25/2014 11:21   Medications: I have reviewed the patient's current medications. Scheduled Meds: . heparin  5,000 Units Subcutaneous 3 times per day  . insulin aspart  0-9 Units Subcutaneous TID WC  . lidocaine  1 patch Transdermal Q24H   Continuous Infusions: . dextrose 5 % and 0.9% NaCl 125 mL/hr at 03/29/2014 2158   PRN Meds:.ipratropium-albuterol, morphine injection, sodium chloride Assessment/Plan:  Acute Encephalopathy - Unchanged from yesterday, still nonverbal, restless, unable to follow commands but able to open eyes and moaning.  Most likely multifactorial due to acute pancreatitis, complicated cystitis, uremia, osteomyelitis,  hypercalcemia, and medications. Pt with questionable sarcoidosis.  -Oxygen therapy to keep SpO2 > 92%  -Duoneb Q 2 hr PRN  -NPO  -D5 NS 100 mL/hr  -F/ U ammonia (wnl), TSH (wnl), PTH, CK (584 H to 621H) -F/U blood cultures   -D/C cefepime & daptomycin, antibiotics per ID consult -Avoid sedative medications  -Hold home cymbalta 60 mg daily  -Hold home norco 10-325 mg Q 6 hr PRN  -Obtain stat MRI brain w/o contrast ---> No acute abnormality -Obtain neurology consult for possible EEG  Acute Pancreatitis - Pt with reported vomiting and abdominal pain of 2 day duration with elevated lipase 1219 and mildly elvated ALT 71 with CT image findings of ill-defined pancreas with stranding about the head of  the pancreas and some blurring of the fat planes. Pt is s/p cholecystectomy with no recent alcohol use. Etiology unclear, possibly medication induced vs hypercalcemia. No prior history of pancreatitis. Pt received 500 cc bolus (x2) in ED.  -NPO  -D5 NS 100 mL/hr  -IV morphine 1-2 mg Q 4 hr PRN pain  -Obtain lipid panel --> TG 77 -Repeat lipase level   AKI on CKD Stage 4 - improving, Cr 4.80 to 4.23 above baseline 1.9. Etiology likely due to prerenal azotemia in setting of hypovolemia from GI loss (vomiting) vs antibiotics (daptomycin,  recent vancomycin use with high trough level 26 on 12/31/13).  -Appreciate renal consult  -Obtain UNa, UCr, UUrea to calculate Fe Urea  -Urinary eosinophils  -Avoid nephrotoxins  -Hold home torsemide 20 mg daily  -Monitor BMP  -Place foley catheter   Hypokalemia - K 3.1 this AM. Magnesium levels normal. Etiology due to decreased PO intake and recent GI loss (vomiting).  -Replete with caution in setting of CKD -Monitor BMP  Complicated Cystitis - UA with large leukocytes, pyuria (21-50), and bacteriuria. Also with moderate Hg. Pt received ciprofloxacin in ED. Pt with recent urinary incontinence and bladder distension on CT imaging.  -Continue IV cefepime  -F/U urine cultures  -Place foley catheter   Osteomyelitis of T10-T11 - Currently with no sepsis criteria. Diagnosed Jan 2015. Pt has received 2 weeks of IV ceftriaxone and vancomycin; doxycyline and amoxicillin; and now IV cefepime and daptomycin since 4/29 for 6 weeks. CT revealing destructive process at T10-T11 secondary to osteomyelitis and discitis. PICC line placed about 3 weeks ago.  -D/C IV cefepime (AKI) and daptomycin (CK elevated)  -ID consult today -Consider re-imaging of thoracic spine  -Obtain ESR & CRP  Hypercalcemia in setting of secondary hyperparathyroidism with vitamin insufficiency - Corrected calcium 12.9. Last PTH with 176.8 (H), vitamin D25-OH 26 (L), 1-25OH 16 (L), Also questionable sarcoidosis per pulmonology.  -D5 NS 125 mL/hr  -Obtain PTH, 25-OH Vitamin D, phosphorus  -Obtain SPEP, kappa:lamba light chains   Hypertension - Currently hypertensive. Pt not on home medications since 5/15.  -Clonidine patch weekly (at home on clonidine 0.1 mg BID)  -Hold home carvedilol 25 mg BID  -Hold home torsemide 20 mg daily  -Hold imdur 15 mg daily  -Hold hydralazine 50 mg   Chronic Grade 1 Diastolic CHF - Last 2D -echo on 12/17/13 with EF 60-65% and grade 1 diastolic dysfunction. CXR with mild interstitial edema. Pt  appears hypovolemic on exam.  -Hold home torsemide 20 mg daily  -Hold home imdur 15 mg daily  -Monitor daily weights and strict I & O's  -Obtain pro-BNP  Chronic Normocytic Anemia - Pt with Hg 9.8 on admission above baseline 9. No recent anemia panel.  -  Monitor for bleeding  -Monitor CBC  -Obtain anemia panel 5/17 --> reticulocytes (wnl)  -Transfuse if Hg <7   Non-insulin dependent Type II DM - Last A1c of 6.8 of 11/21/13. Pt does not appear to be on medications at home.  -Obtain A1c --> 6.0 -Sensitive SSI  -CBG monitoring before meals & at bedtime   Diet: NPO  DVT Ppx: SQ heparin TID  Code: Full   Dispo: Disposition is deferred at this time, awaiting improvement of current medical problems.  The patient does have a current PCP (No Pcp Per Patient) and does need an [facility] hospital follow-up appointment after discharge.  The patient does have transportation limitations that hinder transportation to clinic appointments.  .Services Needed at time of discharge: Y = Yes, Blank = No PT:   OT:   RN:   Equipment:   Other:     LOS: 1 day   Juluis Mire, MD 04/04/2014, 12:05 PM

## 2014-04-04 NOTE — H&P (Addendum)
INTERNAL MEDICINE TEACHING ATTENDING NOTE  Day 1 of stay  Patient name: [name] [name]  MRN: [id number] Date of birth: 08/13/1930   80 y.o. female with hypertension, hyperlipidemia, NIDDM, grade 1 dCHF, CKD4, and vertebral osteomyelitis comes in with altered mental status  (confused,moaning) since the past two days. She was well functioning, alert and oriented before that, per daughter.   On exam today, the patient is an obese african [place] female. She is lying in bed and moaning.  Her eyes are open. Her pupils are PERRL, but could not test for EOMI. There is no scleral icterus. No cervical lymphadenopathy. JVD could not be appreciatd due to the position of the patient.  Cardiac - S1S2 RRR, no murmurs.  Lungs - vesicular breath sounds, no other rales or rhonchi or wheezing sounds. Abdomen - tender RUQ Neuro - Limited exam. Patient is not alert. Eyes open, but she is not responding to threat when I bring my hand suddenly close, by blinking her eyes.  I see marked mouth deviation to left side. Her forehead has wrinkles on both sides. I could not look into her mouth. She is not answering any questions.  She is not following moving objects or figures around her. She is withdrawing to pain, otherwise she is moving all extremities in a non-purposeful manner. Babinski seem to be upgoing. Other refliexes could not be elicited. The patient moans when I do anything, so eliciting meningial signs and interpreting them is difficult.  CT head has been negative for acute changes or blood.  Noted labs, and infected UA   Recent Labs Lab 04/18/2014 1023 04/11/2014 1105 04/04/2014 1855 04/04/14 0513  NA 142 139  --  142  K 3.6* 3.4*  --  3.1*  CL 103 106  --  106  CO2 21  --   --  22  GLUCOSE 93 93  --  95  BUN 46* 41*  --  45*  CREATININE 4.29* 4.80*  --  4.23*  CALCIUM 11.6*  --   --  11.0*  MG  --   --  2.2  --   PHOS  --   --  3.0 3.0   TSH is normal.   Recent Labs Lab 04/12/2014 1034 04/16/2014 1105  04/04/14 0513  HGB 9.8* 10.5* 9.6*  HCT 29.9* 31.0* 28.5*  WBC 9.3  --  9.7  PLT 203  --  182   Assessment and Plan  Acute encephalopathy - My differential is broad - Acute Stroke, Seizure, HSV encephalitis, Meningitis, spinal epidural abscess (did not show up on CT though)metabolic encephalopathy (which includes uremia and infection), disorientation due to UTI, complicated pancreatitis. I will obtain neurology (?stroke, encephalitis), infectious (ongoing osteomyelitis, ?encephalitis, meningitis) and nephrology consults (?uremic encephalopathy, CKD).  CKD - getting better with fluids.  Pancreatitis - continue fluids however if proBNP is high, we will have to be cautious, which might result in undertreatment of pancreatitis. I would like to get a GI consult in this case of complicated pancreatitis.   Additional tests to be done - brain MRI stat, pro-BNP, EEG. I am not sure if it safe to LP yet, given any infectious process is going on surrounding the spine/cord.  If Pro BNP comes back high - we will follow up with ECHO.    Continue antibiotics for osteomyelitis per pharmacy and ID.   I have seen and evaluated this patient and discussed it with my IM resident team.  Please see the rest of the plan  per resident note from today.   Goldie Dimmer 04/04/2014, 9:48 AM.

## 2014-04-04 NOTE — Progress Notes (Signed)
MRI completed.  Client remains restless and thrashing about in the bed.  Was medicated for as ordered, moaning.  Spoke with daughter about foley.  Foley inserted per order.  IV infusing via PIC in right upper arm.

## 2014-04-04 NOTE — Consult Note (Signed)
[name] [name] is an 80 y.o. female referred by Dr Ellwood Dense   Chief Complaint: Acute on CKD HPI: 80yo BF admitted yest from [facility] for confusion.  Pt has been treated for T10-11 osteo since 1/15, initially on Vanco/ceftriaxone then doxy and amp and now daptomycin and cefepime since 03/17/14.  Baseline Scr mid to upper 1's and yest 4.8 and today 4.23. No UO recorded and nurses say daughter refused to allow a foley.  Pt is unable to give any Hx.  CT abd suggest pancreatic inflammation and lipase >1200 with unremarkable kidneys.  Pt has been unable to take PO for several days.  Past Medical History  Diagnosis Date  . Hypertension   . Gout   . Hypercholesteremia   . Diabetes mellitus   . CHF (congestive heart failure)   . Renal disorder   . Cancer   . Edema   . Chronic diastolic CHF (congestive heart failure), NYHA class 2 12/27/2013  . Hypertensive heart disease 12/28/2013  . Pleural effusion on right 12/13/2013  . HCAP (healthcare-associated pneumonia) 12/22/2013  . Osteomyelitis of thoracic spine 11/29/2013  . Discitis of thoracic region 11/29/2013  . CKD (chronic kidney disease) stage 4, GFR 15-29 ml/min 11/20/2013  . Hyperparathyroidism 01/10/2014  . Generalized weakness 11/30/2013    Past Surgical History  Procedure Laterality Date  . Cesarean section    . Rotator cuff repair    . Tonsillectomy    . Tubal ligation      Family History  Problem Relation Age of Onset  . Hypertension Mother   . Hypertension Father   . Hypertension Daughter    Social History:  reports that she quit smoking about 34 years ago. Her smoking use included Cigarettes. She has a 10 pack-year smoking history. She has never used smokeless tobacco. She reports that she does not drink alcohol or use illicit drugs.  Allergies:  Allergies  Allergen Reactions  . Motrin [Ibuprofen] Hives    Medications Prior to Admission  Medication Sig Dispense Refill  . acetaminophen (TYLENOL) 325 MG tablet Take 650 mg by mouth every 4  (four) hours as needed for fever. *takes if temperature over 99.5*      . carvedilol (COREG) 25 MG tablet Take 1 tablet (25 mg total) by mouth 2 (two) times daily with a meal.      . ceFEPIme 2 g in dextrose 5 % 50 mL Inject 2 g into the vein every 12 (twelve) hours.  84 g  0  . cloNIDine (CATAPRES) 0.1 MG tablet Take 0.1 mg by mouth 2 (two) times daily.      Marland Kitchen DAPTOmycin (CUBICIN) 500 MG injection Inject 10.8 mLs (540 mg total) into the vein every other day.  42 each  0  . DULoxetine (CYMBALTA) 60 MG capsule Take 60 mg by mouth daily.      . feeding supplement, ENSURE, (ENSURE) PUDG Take 1 Container by mouth 3 (three) times daily between meals.    0  . hydrALAZINE (APRESOLINE) 50 MG tablet Take 50 mg by mouth 4 (four) times daily.      Marland Kitchen HYDROcodone-acetaminophen (NORCO) 10-325 MG per tablet Take 1 tablet by mouth every 6 (six) hours as needed for moderate pain.  30 tablet  0  . isosorbide mononitrate (IMDUR) 15 mg TB24 24 hr tablet Take 0.5 tablets (15 mg total) by mouth daily.      Marland Kitchen lidocaine (LIDODERM) 5 % Place 1 patch onto the skin daily. Remove & Discard patch within  12 hours or as directed by MD      . Multiple Vitamins-Minerals (MULTIVITAMIN WITH MINERALS) tablet Take 1 tablet by mouth daily.      . ondansetron (ZOFRAN) 4 MG tablet Take 4 mg by mouth every 8 (eight) hours as needed for nausea.       Marland Kitchen senna (SENOKOT) 8.6 MG TABS tablet Take 2 tablets by mouth daily as needed.       . torsemide (DEMADEX) 20 MG tablet Take 1 tablet (20 mg total) by mouth daily.  30 tablet  11  . vitamin C (ASCORBIC ACID) 500 MG tablet Take 500 mg by mouth daily.      Marland Kitchen ipratropium-albuterol (DUONEB) 0.5-2.5 (3) MG/3ML SOLN Take 3 mLs by nebulization every 2 (two) hours as needed (shortness of breath).         Lab Results: UA: WBC 21-50, 100mg  protein   Recent Labs  04/11/2014 1034 04/15/2014 1105 04/04/14 0513  WBC 9.3  --  9.7  HGB 9.8* 10.5* 9.6*  HCT 29.9* 31.0* 28.5*  PLT 203  --  182    BMET  Recent Labs  03/27/2014 1023 04/07/2014 1105 04/06/2014 1855 04/04/14 0513  NA 142 139  --  142  K 3.6* 3.4*  --  3.1*  CL 103 106  --  106  CO2 21  --   --  22  GLUCOSE 93 93  --  95  BUN 46* 41*  --  45*  CREATININE 4.29* 4.80*  --  4.23*  CALCIUM 11.6*  --   --  11.0*  PHOS  --   --  3.0 3.0   LFT  Recent Labs  04/17/2014 1023 04/04/14 0513  PROT 7.0  --   ALBUMIN 2.4* 2.2*  AST 71*  --   ALT 29  --   ALKPHOS 64  --   BILITOT 0.3  --    Ct Abdomen Pelvis Wo Contrast  04/02/2014   CLINICAL DATA:  Altered level of consciousness  EXAM: CT ABDOMEN AND PELVIS WITHOUT CONTRAST  TECHNIQUE: Multidetector CT imaging of the abdomen and pelvis was performed following the standard protocol without IV contrast.  COMPARISON:  None.  FINDINGS: Patchy bibasilar pulmonary opacities are compatible with early airspace disease or scattered subsegmental atelectasis.  Motion artifact severely degrades exam.  Postcholecystectomy.  Small calcified granulomata in the liver.  Spleen is within normal limits.  Adrenal glands are unremarkable.  The pancreas is ill-defined. There is stranding about the head of the pancreas some blurring of the fat planes. Inflammatory changes are not excluded. Trace free fluid in the right pericolic gutter.  No hydronephrosis.  Simple cysts in the kidneys.  Bladder is distended.  Uterus and adnexa are within normal limits.  The destructive process at T10-T11 secondary to osteomyelitis and discitis is again noted. Spinal stenosis at this level is re- demonstrated.  IMPRESSION: Limited exam secondary to motion artifact  Findings suggest pancreatitis. Correlate with amylase and lipase levels.  Bibasilar atelectasis versus airspace disease.   Electronically Signed   By: Maryclare Bean M.D.   On: 04/14/2014 12:02   Ct Head Wo Contrast  03/31/2014   CLINICAL DATA:  Altered level of consciousness  EXAM: CT HEAD WITHOUT CONTRAST  TECHNIQUE: Contiguous axial images were obtained from  the base of the skull through the vertex without intravenous contrast.  COMPARISON:  None.  FINDINGS: Global atrophy. Chronic ischemic changes in the periventricular white matter.  Motion artifact severely degrades the study despite  multiple attempts at scanning.  Multiple calcifications are seen within the region of the tentorium. These are likely all extra-axial calcifications. This is difficult to confirm on axial imaging only.  No mass effect, midline shift, or acute intracranial hemorrhage. No obvious skull fracture. Go mucous material is present in the maxillary sinuses.  IMPRESSION: No acute intracranial pathology. Chronic ischemic changes. Motion artifact degrades the study   Electronically Signed   By: Maryclare Bean M.D.   On: 04/08/2014 11:57   Dg Chest Port 1 View  03/30/2014   CLINICAL DATA:  Altered mental status  EXAM: PORTABLE CHEST - 1 VIEW  COMPARISON:  Chest radiograph December 27, 2013 and chest CT January 19, 2014  FINDINGS: There is a central catheter with tip in the superior vena cava near the cavoatrial junction. No pneumothorax. There is mild interstitial edema. Heart is borderline enlarged with normal pulmonary vascularity. No airspace consolidation.  IMPRESSION: Evidence of a degree of congestive heart failure. No consolidation. No pneumothorax.   Electronically Signed   By: Lowella Grip M.D.   On: 04/16/2014 11:21    ROS: unobtainable as pt encephalopathic  PHYSICAL EXAM: Blood pressure 147/54, pulse 89, temperature 98 F (36.7 C), temperature source Axillary, resp. rate 21, height 5\' 2"  (1.575 m), weight 90.81 kg (200 lb 3.2 oz), SpO2 100.00%. HEENT: unable to assess as pt not cooperative. NECK:No JVD LUNGS:clear ant CARDIAC:RRR with extra beats.  No MRG ABD:+BS Soft, appears too have tenderness mid abd.  No HSM EXT:No edema NEURO:moves all ext but does not follow commands.  Moaning continuously  Assessment: 1. Acute on CKD 3 most likely sec volume depletion and  pancreatitis. She has been on AB for awhile which raises possibility of AIN but no rash or eosinophilia.  Given age and hypercalcemia, will need to RO MM 2. Chronic hypercalcemia, ? Etiology.  PTH was elevated but difficult to know if primary or secondary 3. Anemia 4. Pancreatitis 5. Encephalopathy, ? etiology 6. DM 7. hypokalemia PLAN: 1. Cont IV fluids 2. She needs a foley cath, not only to follow UO but also to get urine studies 3. UNa and Cr, urine for eos 4. SPEP 5. Daily Scr 6. Replace Kcl   Windy Kalata 04/04/2014, 12:02 PM

## 2014-04-04 NOTE — Consult Note (Addendum)
Reason for Consult: Encephalopathy Referring Physician: Dr. Ellwood Dense  CC: Encephalopathy  HPI: [name] [name] is an 80 y.o. 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 She initially received vancomycin and ceftriaxone. The patient showed improvement and was switched to oral antibiotics.  She was last seen by infectious disease on 03/17/2014 at which time she was placed on intravenous daptomycin and cefepime based on the results of a recent MRI and increased pain.   She has been staying at the [facility] facility in [place]. She was reportedly alert, oriented, and functioning at a high level until 2 days ago when she became confused and stopped following commands. She was admitted to [facility] yesterday and neurology was asked to consult for further evaluation of her altered mental status. There is concern that the encephalopathy might be related to the cefepime therapy.  Past Medical History  Diagnosis Date  . Hypertension   . Gout   . Hypercholesteremia   . Diabetes mellitus   . CHF (congestive heart failure)   . Renal disorder   . Cancer   . Edema   . Chronic diastolic CHF (congestive heart failure), NYHA class 2 12/27/2013  . Hypertensive heart disease 12/28/2013  . Pleural effusion on right 12/13/2013  . HCAP (healthcare-associated pneumonia) 12/22/2013  . Osteomyelitis of thoracic spine 11/29/2013  . Discitis of thoracic region 11/29/2013  . CKD (chronic kidney disease) stage 4, GFR 15-29 ml/min 11/20/2013  . Hyperparathyroidism 01/10/2014  . Generalized weakness 11/30/2013    Past Surgical History  Procedure Laterality Date  . Cesarean section    . Rotator cuff repair    . Tonsillectomy    . Tubal ligation      Family History  Problem Relation Age of Onset  . Hypertension Mother   . Hypertension Father   . Hypertension Daughter     Social History:  reports that she quit smoking about 34 years ago. Her smoking  use included Cigarettes. She has a 10 pack-year smoking history. She has never used smokeless tobacco. She reports that she does not drink alcohol or use illicit drugs.  Allergies  Allergen Reactions  . Motrin [Ibuprofen] Hives    Medications:  Scheduled: . heparin  5,000 Units Subcutaneous 3 times per day  . insulin aspart  0-9 Units Subcutaneous TID WC  . lidocaine  1 patch Transdermal Q24H    ROS: Unobtainable secondary to confusion.  Physical Examination: Blood pressure 147/54, pulse 89, temperature 98 F (36.7 C), temperature source Axillary, resp. rate 21, height 5\' 2"  (1.575 m), weight 200 lb 3.2 oz (90.81 kg), SpO2 100.00%.  Neurologic Examination Difficult exam. The patient's nurse reports that the patient recently received morphine for pain which may affect the patient's ability to cooperate.  Mental Status: The patient does not respond to questions or to commands. She is moaning and rhythmically turning her head from side to side. Cranial Nerves: II: Discs not visualized. Pupils equal and sluggish. III,IV, VI: ptosis not present, extraocular movements could not be tested. V,VII: The patient's face is grossly symmetrical. VIII: hearing unable to assess IX,X: gag unable to assess XI: The patient does not follow commands for shoulder shrug XII: She does not follow commands to extend her tongue Motor: She moves all extremities spontaneously but not to command. Tone and bulk:normal tone throughout; no atrophy noted Sensory: She withdraws from painful stimuli Deep Tendon Reflexes: 1+ and symmetric throughout Plantars: Upgoing bilaterally Cerebellar: The patient  could not follow commands for cerebellar testing. Gait: Did not attempt ambulation   Laboratory Studies:   Basic Metabolic Panel:  Recent Labs Lab 04/04/2014 1023 03/27/2014 1105 03/23/2014 1855 04/04/14 0513  NA 142 139  --  142  K 3.6* 3.4*  --  3.1*  CL 103 106  --  106  CO2 21  --   --  22  GLUCOSE  93 93  --  95  BUN 46* 41*  --  45*  CREATININE 4.29* 4.80*  --  4.23*  CALCIUM 11.6*  --   --  11.0*  MG  --   --  2.2  --   PHOS  --   --  3.0 3.0    Liver Function Tests:  Recent Labs Lab 03/31/2014 1023 04/04/14 0513  AST 71*  --   ALT 29  --   ALKPHOS 64  --   BILITOT 0.3  --   PROT 7.0  --   ALBUMIN 2.4* 2.2*    Recent Labs Lab 03/25/2014 1023  LIPASE 1219*    Recent Labs Lab 03/20/2014 1855  AMMONIA 21    CBC:  Recent Labs Lab 04/05/2014 1034 04/07/2014 1105 04/04/14 0513  WBC 9.3  --  9.7  NEUTROABS 5.9  --  6.1  HGB 9.8* 10.5* 9.6*  HCT 29.9* 31.0* 28.5*  MCV 84.7  --  83.8  PLT 203  --  182    Cardiac Enzymes:  Recent Labs Lab 03/20/2014 1855 04/04/14 0513  CKTOTAL 584* 621*  TROPONINI  --  <0.30    BNP: No components found with this basename: POCBNP,   CBG:  Recent Labs Lab 03/19/2014 1348 04/01/2014 1627 04/18/2014 2144 04/04/14 0611  GLUCAP 100* 79 70 102*    Microbiology: Results for orders placed during the hospital encounter of 12/16/13  URINE CULTURE     Status: None   Collection Time    12/16/13  5:47 PM      Result Value Ref Range Status   Specimen Description URINE, CATHETERIZED   Final   Special Requests NONE   Final   Culture  Setup Time     Final   Value: 12/16/2013 19:12     Performed at [place]     Final   Value: NO GROWTH     Performed at [facility]   Culture     Final   Value: NO GROWTH     Performed at [facility]   Report Status 12/17/2013 FINAL   Final  CULTURE, BLOOD (ROUTINE X 2)     Status: None   Collection Time    12/16/13 11:14 PM      Result Value Ref Range Status   Specimen Description BLOOD PICC LINE   Final   Special Requests BOTTLES DRAWN AEROBIC ONLY 10CC   Final   Culture  Setup Time     Final   Value: 12/17/2013 03:34     Performed at [place]     Final   Value: NO GROWTH 5 DAYS     Performed at [facility]    Report Status 12/23/2013 FINAL   Final  MRSA PCR SCREENING     Status: None   Collection Time    12/17/13 12:27 AM      Result Value Ref Range Status   MRSA by PCR NEGATIVE  NEGATIVE Final   Comment:  The GeneXpert MRSA Assay (FDA     approved for NASAL specimens     only), is one component of a     comprehensive MRSA colonization     surveillance program. It is not     intended to diagnose MRSA     infection nor to guide or     monitor treatment for     MRSA infections.    Coagulation Studies:  Recent Labs  03/20/2014 1855  LABPROT 16.2*  INR 1.33    Urinalysis:  Recent Labs Lab 04/10/2014 1053  COLORURINE YELLOW  LABSPEC 1.014  PHURINE 6.5  GLUCOSEU NEGATIVE  HGBUR MODERATE*  BILIRUBINUR NEGATIVE  KETONESUR 15*  PROTEINUR 100*  UROBILINOGEN 0.2  NITRITE NEGATIVE  LEUKOCYTESUR LARGE*    Lipid Panel:     Component Value Date/Time   CHOL 238* 03/21/2014 2000   TRIG 77 04/14/2014 2000   HDL 56 04/13/2014 2000   CHOLHDL 4.3 03/19/2014 2000   VLDL 15 04/15/2014 2000   LDLCALC 167* 03/25/2014 2000    HgbA1C:  Lab Results  Component Value Date   HGBA1C 6.0* 04/14/2014    Urine Drug Screen:   No results found for this basename: labopia, cocainscrnur, labbenz, amphetmu, thcu, labbarb    Alcohol Level: No results found for this basename: ETH,  in the last 168 hours  Other results: EKG: SR rate 92 beats per minute. Please see formal reading for full details.  Imaging:  Ct Abdomen Pelvis Wo Contrast 04/02/2014    Limited exam secondary to motion artifact  Findings suggest pancreatitis. Correlate with amylase and lipase levels.  Bibasilar atelectasis versus airspace disease.     Ct Head Wo Contrast 04/14/2014    No acute intracranial pathology. Chronic ischemic changes. Motion artifact degrades the study   Mr Brain Wo Contrast 04/04/2014    No acute abnormality.      Dg Chest Port 1 View 03/24/2014    Evidence of a degree of congestive heart  failure. No consolidation. No pneumothorax.     Assessment/Plan:  80 year old female with a two day history of altered mental status of uncertain etiology. Possibly related to cefepime therapy. Further assessment and plan per Dr. Leonel Ramsay.  Mikey Bussing PA-C [facility] Pager [phone] 04/04/2014, 3:47 PM   I have seen and evaluated the patient. I have reviewed the above note and made appropriate changes. Altered mental status in the setting of cefepime. She has purposefull movements bilaterally and will squeeze hands, seemingly to commands at times, but not reliably. There are no focal findings on my exam. She is afebrile and neck is supple. In the setting of her cefepime dose, recent AKI, and presentation, I strongly suspect cefepime neurotoxicity as a presenting etiology. Will get EEG. Other possibilities including delirium, uremic encephaloapthy are also possible. Hypercalcemia can also cause mental status changes.   Roland Rack, MD [facility] [phone]  If 7pm- 7am, please page neurology on call as listed in [place].

## 2014-04-04 NOTE — Consult Note (Signed)
[facility] Referring Provider: Internal Medicine Teaching Service Primary Care Physician:  No PCP Per Patient Primary Gastroenterologist:  None known  Reason for Consultation: Acute pancreatitis   HPI:  [name] [name] is a 80 y.o. female with recent history of ID issues (osteo) who was brought from NH due to 2 days of mental status changes.  She is unable to given any history currently, lays in bed sleeping. I can arouse her by pushing deeply on abd or yelling her name very loudly. She will look up, then fall back to sleep.  It appears that she was alert and oriented times three just 1-2 weeks ago per notes in [facility].  At admission for this MS change she had labs drawn. Was found to have lipase 1200, ARF (cr 4.3, was 1.9 a month ago), hypercalcemia (corrected for alb was 12.8), have a UTI (urine culture in progress), normal triglycerides, her AST was 70 but other LFTs all normal.  Her GB has been removed remotely and she reportedly does not drink etoh.  Her mental status has not improved since admission (neuro workup in process but no clear etiology on MRI brain or CT of head).  CT abd yesterday suggested pancreatic edema, peripancreatic inflammation.  She was initially started on 75cc/ hour IVFluids for concern of overhydrating given her known CHF.  THis was increased today to 150cc per hour D5NS     Past Medical History  Diagnosis Date  . Hypertension   . Gout   . Hypercholesteremia   . Diabetes mellitus   . CHF (congestive heart failure)   . Renal disorder   . Cancer   . Edema   . Chronic diastolic CHF (congestive heart failure), NYHA class 2 12/27/2013  . Hypertensive heart disease 12/28/2013  . Pleural effusion on right 12/13/2013  . HCAP (healthcare-associated pneumonia) 12/22/2013  . Osteomyelitis of thoracic spine 11/29/2013  . Discitis of thoracic region 11/29/2013  . CKD (chronic kidney disease) stage 4, GFR 15-29 ml/min 11/20/2013  . Hyperparathyroidism 01/10/2014  .  Generalized weakness 11/30/2013    Past Surgical History  Procedure Laterality Date  . Cesarean section    . Rotator cuff repair    . Tonsillectomy    . Tubal ligation      Prior to Admission medications   Medication Sig Start Date End Date Taking? Authorizing Provider  acetaminophen (TYLENOL) 325 MG tablet Take 650 mg by mouth every 4 (four) hours as needed for fever. *takes if temperature over 99.5*   Yes Historical Provider, MD  carvedilol (COREG) 25 MG tablet Take 1 tablet (25 mg total) by mouth 2 (two) times daily with a meal. 12/21/13  Yes Cherene Altes, MD  ceFEPIme 2 g in dextrose 5 % 50 mL Inject 2 g into the vein every 12 (twelve) hours. 03/17/14  Yes Truman Hayward, MD  cloNIDine (CATAPRES) 0.1 MG tablet Take 0.1 mg by mouth 2 (two) times daily.   Yes Historical Provider, MD  DAPTOmycin (CUBICIN) 500 MG injection Inject 10.8 mLs (540 mg total) into the vein every other day. 03/17/14  Yes Truman Hayward, MD  DULoxetine (CYMBALTA) 60 MG capsule Take 60 mg by mouth daily.   Yes Historical Provider, MD  feeding supplement, ENSURE, (ENSURE) PUDG Take 1 Container by mouth 3 (three) times daily between meals. 01/01/14  Yes Barton Dubois, MD  hydrALAZINE (APRESOLINE) 50 MG tablet Take 50 mg by mouth 4 (four) times daily.   Yes Historical Provider, MD  HYDROcodone-acetaminophen (NORCO) 10-325 MG per tablet Take 1 tablet by mouth every 6 (six) hours as needed for moderate pain. 01/01/14  Yes Barton Dubois, MD  isosorbide mononitrate (IMDUR) 15 mg TB24 24 hr tablet Take 0.5 tablets (15 mg total) by mouth daily. 01/01/14  Yes Barton Dubois, MD  lidocaine (LIDODERM) 5 % Place 1 patch onto the skin daily. Remove & Discard patch within 12 hours or as directed by MD   Yes Historical Provider, MD  Multiple Vitamins-Minerals (MULTIVITAMIN WITH MINERALS) tablet Take 1 tablet by mouth daily.   Yes Historical Provider, MD  ondansetron (ZOFRAN) 4 MG tablet Take 4 mg by mouth every 8 (eight) hours  as needed for nausea.  02/05/14  Yes Historical Provider, MD  senna (SENOKOT) 8.6 MG TABS tablet Take 2 tablets by mouth daily as needed.    Yes Historical Provider, MD  torsemide (DEMADEX) 20 MG tablet Take 1 tablet (20 mg total) by mouth daily. 02/13/14  Yes Gerlene Fee, NP  vitamin C (ASCORBIC ACID) 500 MG tablet Take 500 mg by mouth daily.   Yes Historical Provider, MD  ipratropium-albuterol (DUONEB) 0.5-2.5 (3) MG/3ML SOLN Take 3 mLs by nebulization every 2 (two) hours as needed (shortness of breath).    Historical Provider, MD    Current Facility-Administered Medications  Medication Dose Route Frequency Provider Last Rate Last Dose  . dextrose 5 %-0.9 % sodium chloride infusion   Intravenous Continuous Marjan Rabbani, MD 100 mL/hr at 04/04/14 1244    . heparin injection 5,000 Units  5,000 Units Subcutaneous 3 times per day Cresenciano Genre, MD   5,000 Units at 04/04/14 [phone]  . insulin aspart (novoLOG) injection 0-9 Units  0-9 Units Subcutaneous TID WC Marjan Rabbani, MD      . ipratropium-albuterol (DUONEB) 0.5-2.5 (3) MG/3ML nebulizer solution 3 mL  3 mL Nebulization Q2H PRN Cresenciano Genre, MD      . lidocaine (LIDODERM) 5 % 1 patch  1 patch Transdermal Q24H Madilyn Fireman, MD   1 patch at 04/04/14 0828  . morphine 2 MG/ML injection 1-2 mg  1-2 mg Intravenous Q4H PRN Cresenciano Genre, MD   2 mg at 04/04/14 1231  . sodium chloride 0.9 % injection 10-40 mL  10-40 mL Intracatheter PRN Madilyn Fireman, MD   3 mL at 04/04/14 1232    Allergies as of 04/15/2014 - Review Complete 03/25/2014  Allergen Reaction Noted  . Motrin [ibuprofen] Hives 04/08/2012    Family History  Problem Relation Age of Onset  . Hypertension Mother   . Hypertension Father   . Hypertension Daughter     History   Social History  . Marital Status: Divorced    Spouse Name: N/A    Number of Children: N/A  . Years of Education: N/A   Occupational History  . 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 Smoking status: Former Smoker -- 0.50 packs/day for 20 years    Types: Cigarettes    Quit date: 12/17/1979  . Smokeless tobacco: Never Used  . Alcohol Use: No  . Drug Use: No  . Sexual Activity: Not on file   Other Topics Concern  . Not on file   Social History Narrative  . No narrative on file     Review of Systems: Pertinent positive and negative review of systems were noted in the above HPI section. Complete review of systems was performed and was otherwise normal.   Physical Exam: Vital signs  in last 24 hours: Temp:  [97.9 F (36.6 C)-98.6 F (37 C)] 98.6 F (37 C) (05/17 1429) Pulse Rate:  [84-89] 84 (05/17 1429) Resp:  [18-21] 18 (05/17 1429) BP: (147-185)/(54-99) 184/67 mmHg (05/17 1429) SpO2:  [100 %] 100 % (05/17 1429) Weight:  [194 lb 12.8 oz (88.361 kg)-200 lb 3.2 oz (90.81 kg)] 200 lb 3.2 oz (90.81 kg) (05/17 0433) Last BM Date:  (few days per dtr ) Constitutional: generally well-appearing Psychiatric: alert and oriented x3 Eyes: extraocular movements intact Mouth: oral pharynx moist, no lesions Neck: supple no lymphadenopathy Cardiovascular: heart regular rate and rhythm Lungs: clear to auscultation bilaterally Abdomen: soft, nontender, nondistended, no obvious ascites, no peritoneal signs, normal bowel sounds Extremities: no lower extremity edema bilaterally Skin: no lesions on visible extremities   Lab Results:  Recent Labs  04/02/2014 1034 04/15/2014 1105 04/04/14 0513  WBC 9.3  --  9.7  HGB 9.8* 10.5* 9.6*  HCT 29.9* 31.0* 28.5*  PLT 203  --  182  MCV 84.7  --  83.8   BMET  Recent Labs  04/09/2014 1023 03/24/2014 1105 04/04/14 0513  NA 142 139 142  K 3.6* 3.4* 3.1*  CL 103 106 106  CO2 21  --  22  GLUCOSE 93 93 95  BUN 46* 41* 45*  CREATININE 4.29* 4.80* 4.23*  CALCIUM 11.6*  --  11.0*   LFT  Recent Labs  03/29/2014 1023 04/04/14 0513  BILITOT 0.3  --   AST 71*  --   ALT 29  --   ALKPHOS 64  --   PROT 7.0  --    ALBUMIN 2.4* 2.2*   PT/INR  Recent Labs  04/14/2014 1855  LABPROT 16.2*  INR 1.33    Impression/Plan: 80 y.o. female with acute pancreatitis, unclear etiology.  None of her recent meds are clearly associated with pancreatitis. GB removed remotely and LFTs only slightly elevated at admission (argues against biliary pancreatitis).  AST 70, ALT normal; this is typical "alcohol" related pattern to LFTs however we are told she does not drink etoh.  Calcium level (corrected) was 12.8 at admission.  Hypercalcemia can certainly be the etiology of her AP but then question becomes what has caused her hypercalcemia (workup is ongoing I see).  For now, should continue IVF. 150cc per hour seems reasonable and now that there is a foley in place (after it was initially declined by family) her UO can be monitored and fluids adjusted up or down as needed.  Will follow with you.  She had UTI at admission; a reasonable etiology could be that she developed UTI, become dehydrated over time from this UTI and then calcium rose as a result of dehydration, ARF leading to acute pancreatitis.    Acute pancreatitis is not usually associated with MS changes unless severe, MOSF and her pancreatitis appears more mild than that.    Milus Banister, MD  04/04/2014, 3:06 PM [facility] Pager [phone]

## 2014-04-04 NOTE — Progress Notes (Signed)
Patient ID: [name] [name], female   DOB: 1930/01/23, 80 y.o.   MRN: [id number]     [facility]  Allergies  Allergen Reactions  . Motrin [Ibuprofen] Hives     Chief Complaint  Patient presents with  . Acute Visit    change in status     HPI:  She is acutely confused; has n/v. she has been running low grade fevers. She is unable to participate in the hpi or ros. The nursing staff is concerned about acute delirium. She does have a low k+ at 3.4; with a worsening renal function she is on long term iv abt for her thoracic osteomyelitis.   Past Medical History  Diagnosis Date  . Hypertension   . Gout   . Hypercholesteremia   . Diabetes mellitus   . CHF (congestive heart failure)   . Renal disorder   . Cancer   . Edema   . Chronic diastolic CHF (congestive heart failure), NYHA class 2 12/27/2013  . Hypertensive heart disease 12/28/2013  . Pleural effusion on right 12/13/2013  . HCAP (healthcare-associated pneumonia) 12/22/2013  . Osteomyelitis of thoracic spine 11/29/2013  . Discitis of thoracic region 11/29/2013  . CKD (chronic kidney disease) stage 4, GFR 15-29 ml/min 11/20/2013  . Hyperparathyroidism 01/10/2014  . Generalized weakness 11/30/2013    Past Surgical History  Procedure Laterality Date  . Cesarean section    . Rotator cuff repair    . Tonsillectomy    . Tubal ligation      VITAL SIGNS BP 150/80  Pulse 80  Ht 5' (1.524 m)  Wt 199 lb 11.2 oz (90.583 kg)  BMI 39.00 kg/m2   Patient's Medications  New Prescriptions   No medications on file  Previous Medications   ACETAMINOPHEN (TYLENOL) 325 MG TABLET    Take 650 mg by mouth every 4 (four) hours as needed for fever. *takes if temperature over 99.5*   CARVEDILOL (COREG) 25 MG TABLET    Take 1 tablet (25 mg total) by mouth 2 (two) times daily with a meal.   CEFEPIME 2 G IN DEXTROSE 5 % 50 ML    Inject 2 g into the vein every 12 (twelve) hours.   CLONIDINE (CATAPRES) 0.1 MG TABLET    Take 0.1 mg by mouth 2 (two)  times daily.   DAPTOMYCIN (CUBICIN) 500 MG INJECTION    Inject 10.8 mLs (540 mg total) into the vein every other day.   DULOXETINE (CYMBALTA) 60 MG CAPSULE    Take 60 mg by mouth daily.   FEEDING SUPPLEMENT, ENSURE, (ENSURE) PUDG    Take 1 Container by mouth 3 (three) times daily between meals.   HYDRALAZINE (APRESOLINE) 50 MG TABLET    Take 50 mg by mouth 4 (four) times daily.   HYDROCODONE-ACETAMINOPHEN (NORCO) 10-325 MG PER TABLET    Take 1 tablet by mouth every 6 (six) hours as needed for moderate pain.   IPRATROPIUM-ALBUTEROL (DUONEB) 0.5-2.5 (3) MG/3ML SOLN    Take 3 mLs by nebulization every 2 (two) hours as needed (shortness of breath).   ISOSORBIDE MONONITRATE (IMDUR) 15 MG TB24 24 HR TABLET    Take 0.5 tablets (15 mg total) by mouth daily.   LIDOCAINE (LIDODERM) 5 %    Place 1 patch onto the skin daily. Remove & Discard patch within 12 hours or as directed by MD   MULTIPLE VITAMINS-MINERALS (MULTIVITAMIN WITH MINERALS) TABLET    Take 1 tablet by mouth daily.   ONDANSETRON (ZOFRAN) 4 MG  TABLET    Take 4 mg by mouth every 8 (eight) hours as needed for nausea.    SENNA (SENOKOT) 8.6 MG TABS TABLET    Take 2 tablets by mouth daily as needed.    TORSEMIDE (DEMADEX) 20 MG TABLET    Take 1 tablet (20 mg total) by mouth daily.   VITAMIN C (ASCORBIC ACID) 500 MG TABLET    Take 500 mg by mouth daily.  Modified Medications   No medications on file  Discontinued Medications   No medications on file    SIGNIFICANT DIAGNOSTIC EXAMS  11-20-13: thoracic spine x-ray: 1. Highly unusual appearance of T10 and T11, as above, which is concern for potential discitis/osteomyelitis. Given the patient's history of recent fall, these findings could simply be related to acute trauma, however, the marked irregularity of the endplates at this level raises concern for infection. Correlation with MRI of the thoracic spine with and without IV gadolinium is recommended. 2. The appearance of the lung parenchyma suggests  an underlying interstitial lung disease. This could be better evaluated with followup nonemergent high-resolution chest CT if clinicallyappropriate.  11-20-13: mri lumbar spine: Abnormal destructive changes of the T10-11 disc,  No MR findings of discitis osteomyelitis in the lumbar spine. Degenerative change of the lumbar spine: Moderate canal stenosis at L3-4, mild at L4-5. Neural foraminal narrowing L2-3 to L5-S1: Severe on the right at L5-S1. Mildly widened facets at L3-4, which can be associated with dynamic instability and could be further characterize with flexion and extension radiographs if clinically indicated.  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 Moderate canal stenosis at T10-11 with severe neural foraminal narrowing. Abnormal signal within T11 disc concerning for early discitis without osteomyelitis. Degenerative change at this level resulting in mild canal stenosis and moderate to severe right, moderate left neural foraminal narrowing. Grade 1 paraspinal muscle strain T10-11   12-17-13: 2-d echo: Left ventricle: The cavity size was normal. Wall thickness was increased in a pattern of moderate LVH. Systolic function was normal. The estimated ejection fraction was in the range of 60% to 65%. Wall motion was normal; there were no regional wall motion abnormalities. - Left atrium: The atrium was mildly dilated.- Pulmonary arteries: Systolic pressure was mildly increased.  12-18-13: bilateral lower extremity doppler: - No evidence of deep vein thrombosis involving the right   lower extremity.- No evidence of deep vein thrombosis involving the left lower extremity.  12-28-13: ct of chest: 1. Apical predominant pulmonary fibrosis with superimposed airspace disease. Airspace disease may represent acute phase interstitial pneumonitis or  pneumonia (multifocal pneumonia). Mediastinal adenopathy may be reactive. Adenopathy also raises possibility sarcoidosis accounting for alveolar opacities and adenopathy. 2. Small to moderate bilateral pleural effusions layering dependently. Collapse/consolidation of both lower lobes associated with the effusions 3. Cardiomegaly and probable anemia.  01-19-14: ct of chest: 1. Compared to the prior study, some of the patchy airspace consolidation has largely resolved, although there continue to be patchy areas of fibrosis asymmetrically distributed throughout the lungs bilaterally, with an appearance which may suggest evolving cryptogenic organizing pneumonia (COP). Borderline enlarged and mildly enlarged mediastinal and hilar lymph nodes are presumably chronic and reactive. Followup high-resolution chest CT in 1 year would be useful to assess for temporal changes in the appearance of the lung parenchyma. 2. Chronic changes of discitis osteomyelitis at T10-T11, similar to the prior study. 3. Interval  resolution of previously noted bilateral pleural effusions. 4.  Atherosclerosis, including three-vessel coronary artery disease.  01-19-14: VQ scan: No significant ventilation/ perfusion mismatch. This study constitutes an overall low probability of pulmonary embolus.  03-12-14: lumbar/thoracic spine mri: 1. Evolution of T10-T11 discitis osteomyelitis with further  vertebral body destruction. Associated increased spinal stenosis,  with effaced thoracic spinal cord at this level. No definite cord compression or spinal cord signal abnormality. 2. No paraspinal abscess or drainable fluid collection identified. No new spinal infection identified. 3. Other thoracic levels are stable, including mild degenerative multifactorial spinal stenosis at T11-T12. 4. Stable lumbar spine, including degenerative spinal stenosis at L3-L4.   03-17-14: chest x-ray; 1. Poor inspiration secondary to patient's condition/habitus. 2. Mild  cardiomegaly with mild to moderate pulmonary vascular congestion 3. Right sides picc line with distal tip at the superior vena cava. 4. Patchy atelectasis or early/mild pneumonia at the lower lungs. 5. Mild peribronchial thickening unchanged likely chronic   04-02-14: chest x-ray: 1. Enlarged heart with elevated pulmonary vasculature is consistent with chf. 2. Patchy non-consolidated interstitial changes both lungs are present, consistent with interstitial edema and pneumonia.    04-02-14: kub: 1. Moderate amount of fecal material in the colon. 2. No signs of mechanical obstruction can be identified.    LABS REVIEWED:   11-20-13: wbc 8.7; hgb 12.1; hct 37.3; mcv 86.1plt 202; glucose 125; bun 32; creat 1.85; k+3.9; na++144; ca++ 10.8; sed rate 27; BNP 894.4; hgb a1c 6.8; tsh 1.225 11-25-13: wbc 7.6; hgb 10.8; hct 34.1; mcv 86.3;plt 178; glucose 125; bun 20; creat 1.48; k+4.1; na++140; ca++ 10.2 BNP 1142  12-10-13: bun 15; creat 1.2 12-16-13: wbc 8.2; hgb 9.8 hct 30.2; mcv 83.0; plt 199; glucose 154; bun 39; creat 1.69 ;k+4.5;na++133 BNP: 11565.0; D-Dimer: 1.61; urine culture: no growth; influenza: negative.  12-27-13: wbc 8.0; hgb 9.4; hct 30.1; mcv 84.6; plt 231 glucose 182; bun 27; creat 1.42; k+5.5; na++137; ca++10.9;  BNP 7200 12-28-13: wbc 7.0; hgb 8.2; hct 25.8; mcv 84.;9 plt 231; glucose 126; bun 28; creat 1.70; k+5.0; na++137 ca++10.6; tsh 1.291; ACE 53; vit d 26; PTH 176.8 01-01-14: 158; bun 28; creat 1.93; k+4.3; na++ 139; ca++ 10.7 01-18-14: glucose 106; bun 39; creat 2.0; k+3.8; na++ 135; C02 38; ca++ 10.7   01-21-14: glucose 100; bun 46; creat 1.9; k+3.5; na++ 133  02-03-14: glucose 113; bun 53; creat 2.1; k+3.9; na++131  02-18-14: glucose 130; bun 37; creat 1.9; k+3.8; na++ 132  03-24-14:wbc 5.6; hgb 7.9; hct 25.7; mcv 87.7 ;plt 151 glucose 113; bun 35; creat 2.1; k+3.4; na++136;  03-31-14: wbc 7.2; hgb 8.1; hct 25.4 ;mcv 86.7; plt 171 glucose 112; bun 38; creat 3.3; k+ 3.4; na++133 total ck  412 04-02-14: wbc 8.3; hgb 9.4; hct 28.4; mcv 85; plt 179; glucose 139; bun 46; creat 4.48; k+3.4; na++137 liver normal albumin 2.4; ca++11.0       Review of Systems  Unable to perform ROS    Physical Exam  Constitutional: She appears well-developed and well-nourished.mild distress .  obese  Neck: Neck supple. No JVD present.  Cardiovascular: Normal rate, regular rhythm and intact distal pulses.   Respiratory: Effort normal. No respiratory distress. She has no wheezes.  02 dependent; breath sounds diminished GI: Soft. Bowel sounds are normal. She exhibits no distension.   Musculoskeletal: She exhibits no edema.  Is able to move all extremities; has weakness present in all extremities.   Neurological: confused  Skin: Skin is warm and dry. She is not diaphoretic.  picc line right upper arm     ASSESSMENT/ PLAN:  1. Acute delirium: have obtained stat chest x-ray; kub; cbc; cmp.  She has been given phenergan 25 mg im one time. Will give her one liter NS 75 cc per hour will give her zofran 4 mg every 6 hours for 4 days then return to every 6 hours as needed. Will give her lasix 10 mg iv one time today; will give her a dulcolax supp daily for 3 days. Will have nursing get blood cultures for a temp >= 100.5. On Sunday will recheck cmp; will check chest x-ray on Monday.     Time spent with patient 50 minutes.

## 2014-04-04 NOTE — Progress Notes (Signed)
Patient rested quietly most of night. Daughter at bedside. Incontinent of urine. Morphine IV given for pain. Alert at time but unable to communicate. IV infusing into right picc line.

## 2014-04-04 NOTE — Consult Note (Addendum)
    [facility] for Infectious Disease     Reason for Consult:discitis, encephalopathy    Referring Physician: Dr. Ellwood Dense  Principal Problem:   Acute encephalopathy Active Problems:   Osteomyelitis of thoracic spine   Pancreatitis, acute   Acute on chronic renal failure   Acute on chronic kidney failure   . heparin  5,000 Units Subcutaneous 3 times per day  . insulin aspart  0-9 Units Subcutaneous TID WC  . lidocaine  1 patch Transdermal Q24H    Recommendations: Will hold antibiotics for now   Assessment: She is encephalopathic with an elevated CK.  Differential of elevated CK is rhabdo due to limited movement, seizure, daptomycin.  Differential of encephalopathy is cefepime neurotoxicity, hypercalcemia, other medications.     Antibiotics: Previously on daptomycin and cefepime started around 4/29.    HPI: [name] [name] is a 80 y.o. female with DM, HTN, CKD with previous creatinine of 1.78 presented with altered mental status.  She was seen in ID clinic 4/29 after a repeat MRI showed worsening discitis and was placed on broad spectrum antibiotics.  She previously was treated with vancomycin with ceftriaxone and continued on oral therapy with doxycycline and amoxicillin.  MRI showed evolution of T10-11 discitis and osteomyelitis with furhter vertebral body destruction.  She was then placed on daptomycin (due to renal insufficiency) and cefepime.  She then began to have poor po intake, decreased appetite, nausea then confused and restless.  She developed a fever to 102 at some point but has remained afebrile here.  MRI head without significant disease.    Case discussed with neurology, MRI personally reviewed of back and head.     Review of Systems: Review of systems not obtained due to patient factors. altered mental status  Past Medical History  Diagnosis Date  . Hypertension   . Gout   . Hypercholesteremia   . Diabetes mellitus   . CHF (congestive heart failure)     . Renal disorder   . Cancer   . Edema   . Chronic diastolic CHF (congestive heart failure), NYHA class 2 12/27/2013  . Hypertensive heart disease 12/28/2013  . Pleural effusion on right 12/13/2013  . HCAP (healthcare-associated pneumonia) 12/22/2013  . Osteomyelitis of thoracic spine 11/29/2013  . Discitis of thoracic region 11/29/2013  . CKD (chronic kidney disease) stage 4, GFR 15-29 ml/min 11/20/2013  . Hyperparathyroidism 01/10/2014  . Generalized weakness 11/30/2013    History  Substance Use Topics  . Smoking status: Former Smoker -- 0.50 packs/day for 20 years    Types: Cigarettes    Quit date: 12/17/1979  . Smokeless tobacco: Never Used  . Alcohol Use: No    Family History  Problem Relation Age of Onset  . Hypertension Mother   . Hypertension Father   . Hypertension Daughter    Allergies  Allergen Reactions  . Motrin [Ibuprofen] Hives    OBJECTIVE: Blood pressure 147/54, pulse 89, temperature 98 F (36.7 C), temperature source Axillary, resp. rate 21, height 5\' 2"  (1.575 m), weight 200 lb 3.2 oz (90.81 kg), SpO2 100.00%. General: awake, does not follow commands Skin: no rashes Lungs: CTA B Cor: RRR Abdomen: soft, nt, nd Neuro: unable to assess completely since she does not respond  Microbiology: No results found for this or any previous visit (from the past 240 hour(s)).  Thayer Headings, MD [facility] for Infectious Disease [facility] [email or web address] [phone] pager  [phone] cell 04/04/2014, 1:59 PM

## 2014-04-05 ENCOUNTER — Inpatient Hospital Stay (HOSPITAL_COMMUNITY): Payer: Medicare HMO

## 2014-04-05 DIAGNOSIS — R569 Unspecified convulsions: Secondary | ICD-10-CM

## 2014-04-05 DIAGNOSIS — G9349 Other encephalopathy: Secondary | ICD-10-CM

## 2014-04-05 DIAGNOSIS — N184 Chronic kidney disease, stage 4 (severe): Secondary | ICD-10-CM

## 2014-04-05 DIAGNOSIS — I509 Heart failure, unspecified: Secondary | ICD-10-CM

## 2014-04-05 DIAGNOSIS — M869 Osteomyelitis, unspecified: Secondary | ICD-10-CM

## 2014-04-05 DIAGNOSIS — E876 Hypokalemia: Secondary | ICD-10-CM

## 2014-04-05 DIAGNOSIS — M519 Unspecified thoracic, thoracolumbar and lumbosacral intervertebral disc disorder: Secondary | ICD-10-CM

## 2014-04-05 DIAGNOSIS — D649 Anemia, unspecified: Secondary | ICD-10-CM

## 2014-04-05 DIAGNOSIS — N2581 Secondary hyperparathyroidism of renal origin: Secondary | ICD-10-CM

## 2014-04-05 DIAGNOSIS — G40401 Other generalized epilepsy and epileptic syndromes, not intractable, with status epilepticus: Secondary | ICD-10-CM

## 2014-04-05 DIAGNOSIS — E119 Type 2 diabetes mellitus without complications: Secondary | ICD-10-CM

## 2014-04-05 DIAGNOSIS — I5033 Acute on chronic diastolic (congestive) heart failure: Secondary | ICD-10-CM

## 2014-04-05 DIAGNOSIS — G40901 Epilepsy, unspecified, not intractable, with status epilepticus: Secondary | ICD-10-CM | POA: Diagnosis present

## 2014-04-05 DIAGNOSIS — N3090 Cystitis, unspecified without hematuria: Secondary | ICD-10-CM

## 2014-04-05 DIAGNOSIS — I129 Hypertensive chronic kidney disease with stage 1 through stage 4 chronic kidney disease, or unspecified chronic kidney disease: Secondary | ICD-10-CM

## 2014-04-05 LAB — BASIC METABOLIC PANEL
BUN: 45 mg/dL — AB (ref 6–23)
BUN: 47 mg/dL — ABNORMAL HIGH (ref 6–23)
CHLORIDE: 113 meq/L — AB (ref 96–112)
CO2: 19 mEq/L (ref 19–32)
CO2: 21 mEq/L (ref 19–32)
Calcium: 10.7 mg/dL — ABNORMAL HIGH (ref 8.4–10.5)
Calcium: 10.7 mg/dL — ABNORMAL HIGH (ref 8.4–10.5)
Chloride: 107 mEq/L (ref 96–112)
Creatinine, Ser: 4.46 mg/dL — ABNORMAL HIGH (ref 0.50–1.10)
Creatinine, Ser: 4.52 mg/dL — ABNORMAL HIGH (ref 0.50–1.10)
GFR calc Af Amer: 10 mL/min — ABNORMAL LOW (ref >90)
GFR calc non Af Amer: 8 mL/min — ABNORMAL LOW (ref >90)
GFR calc non Af Amer: 8 mL/min — ABNORMAL LOW (ref >90)
GFR, EST AFRICAN AMERICAN: 10 mL/min — AB (ref >90)
Glucose, Bld: 107 mg/dL — ABNORMAL HIGH (ref 70–99)
Glucose, Bld: 87 mg/dL (ref 70–99)
POTASSIUM: 3.3 meq/L — AB (ref 3.7–5.3)
POTASSIUM: 4.2 meq/L (ref 3.7–5.3)
SODIUM: 147 meq/L (ref 137–147)
Sodium: 145 mEq/L (ref 137–147)

## 2014-04-05 LAB — CBC WITH DIFFERENTIAL/PLATELET
Basophils Absolute: 0 10*3/uL (ref 0.0–0.1)
Basophils Relative: 0 % (ref 0–1)
Eosinophils Absolute: 0.3 10*3/uL (ref 0.0–0.7)
Eosinophils Relative: 3 % (ref 0–5)
HCT: 26.5 % — ABNORMAL LOW (ref 36.0–46.0)
Hemoglobin: 8.9 g/dL — ABNORMAL LOW (ref 12.0–15.0)
LYMPHS ABS: 3.5 10*3/uL (ref 0.7–4.0)
Lymphocytes Relative: 32 % (ref 12–46)
MCH: 28.5 pg (ref 26.0–34.0)
MCHC: 33.6 g/dL (ref 30.0–36.0)
MCV: 84.9 fL (ref 78.0–100.0)
MONOS PCT: 12 % (ref 3–12)
Monocytes Absolute: 1.3 10*3/uL — ABNORMAL HIGH (ref 0.1–1.0)
NEUTROS PCT: 53 % (ref 43–77)
Neutro Abs: 5.7 10*3/uL (ref 1.7–7.7)
PLATELETS: 149 10*3/uL — AB (ref 150–400)
RBC: 3.12 MIL/uL — ABNORMAL LOW (ref 3.87–5.11)
RDW: 16.9 % — AB (ref 11.5–15.5)
WBC: 10.8 10*3/uL — AB (ref 4.0–10.5)

## 2014-04-05 LAB — GLUCOSE, CAPILLARY
GLUCOSE-CAPILLARY: 64 mg/dL — AB (ref 70–99)
Glucose-Capillary: 74 mg/dL (ref 70–99)
Glucose-Capillary: 79 mg/dL (ref 70–99)

## 2014-04-05 LAB — URINE CULTURE: Colony Count: 3000

## 2014-04-05 LAB — DIFFERENTIAL
BASOS ABS: 0.1 10*3/uL (ref 0.0–0.1)
Basophil %: 1.4 %
EOS PCT: 3.3 %
Eosinophil #: 0.3 10*3/uL (ref 0.0–0.7)
Lymphocyte #: 1.6 10*3/uL (ref 1.0–3.6)
Lymphocyte %: 19.2 %
MONOS PCT: 12.5 %
Monocyte #: 1 x10 3/mm — ABNORMAL HIGH (ref 0.2–0.9)
NEUTROS ABS: 5.3 10*3/uL (ref 1.4–6.5)
NEUTROS PCT: 63.6 %

## 2014-04-05 LAB — VITAMIN D 25 HYDROXY (VIT D DEFICIENCY, FRACTURES): VIT D 25 HYDROXY: 23 ng/mL — AB (ref 30–89)

## 2014-04-05 LAB — KAPPA/LAMBDA LIGHT CHAINS
Kappa free light chain: 7.37 mg/dL — ABNORMAL HIGH (ref 0.33–1.94)
Kappa, lambda light chain ratio: 1.2 (ref 0.26–1.65)
Lambda free light chains: 6.12 mg/dL — ABNORMAL HIGH (ref 0.57–2.63)

## 2014-04-05 LAB — PTH, INTACT AND CALCIUM
CALCIUM TOTAL (PTH): 11.1 mg/dL — AB (ref 8.4–10.5)
PTH: 163.8 pg/mL — ABNORMAL HIGH (ref 14.0–72.0)

## 2014-04-05 LAB — LIPASE, BLOOD: LIPASE: 247 U/L — AB (ref 11–59)

## 2014-04-05 LAB — CK: CK TOTAL: 701 U/L — AB (ref 7–177)

## 2014-04-05 MED ORDER — SODIUM CHLORIDE 0.9 % IV SOLN
1000.0000 mg | Freq: Two times a day (BID) | INTRAVENOUS | Status: DC
  Administered 2014-04-05 – 2014-04-06 (×2): 1000 mg via INTRAVENOUS
  Filled 2014-04-05 (×4): qty 10

## 2014-04-05 MED ORDER — VALPROATE SODIUM 500 MG/5ML IV SOLN: 2.5000 g | Freq: Once | INTRAVENOUS | Status: DC

## 2014-04-05 MED ORDER — LORAZEPAM 0.5 MG PO TABS: 2.0000 mg | ORAL_TABLET | Freq: Once | ORAL | Status: AC

## 2014-04-05 MED ORDER — ALTEPLASE 2 MG IJ SOLR
2.0000 mg | Freq: Once | INTRAMUSCULAR | Status: AC
  Administered 2014-04-05: 2 mg
  Filled 2014-04-05: qty 2

## 2014-04-05 MED ORDER — CHLORHEXIDINE GLUCONATE 0.12 % MT SOLN
15.0000 mL | Freq: Two times a day (BID) | OROMUCOSAL | Status: DC
  Administered 2014-04-05 – 2014-04-20 (×29): 15 mL via OROMUCOSAL
  Filled 2014-04-05 (×34): qty 15

## 2014-04-05 MED ORDER — LORAZEPAM 2 MG/ML IJ SOLN: 2.0000 mg | Freq: Once | INTRAMUSCULAR | Status: AC

## 2014-04-05 MED ORDER — VALPROATE SODIUM 500 MG/5ML IV SOLN
500.0000 mg | Freq: Three times a day (TID) | INTRAVENOUS | Status: DC
  Administered 2014-04-05 – 2014-04-08 (×8): 500 mg via INTRAVENOUS
  Filled 2014-04-05 (×10): qty 5

## 2014-04-05 MED ORDER — INSULIN ASPART 100 UNIT/ML ~~LOC~~ SOLN: 0.0000 [IU] | SUBCUTANEOUS | Status: AC

## 2014-04-05 MED ORDER — VALPROATE SODIUM 500 MG/5ML IV SOLN
2.5000 g | INTRAVENOUS | Status: AC
  Administered 2014-04-05: 2500 mg via INTRAVENOUS
  Filled 2014-04-05: qty 25

## 2014-04-05 MED ORDER — LORAZEPAM 2 MG/ML IJ SOLN
INTRAMUSCULAR | Status: AC
  Filled 2014-04-05: qty 1

## 2014-04-05 MED ORDER — SODIUM CHLORIDE 0.9 % IV SOLN
500.0000 mg | Freq: Once | INTRAVENOUS | Status: AC
  Administered 2014-04-05: 500 mg via INTRAVENOUS
  Filled 2014-04-05: qty 5

## 2014-04-05 MED ORDER — BIOTENE DRY MOUTH MT LIQD
15.0000 mL | Freq: Two times a day (BID) | OROMUCOSAL | Status: DC
  Administered 2014-04-06 – 2014-04-21 (×29): 15 mL via OROMUCOSAL

## 2014-04-05 MED ORDER — LORAZEPAM 2 MG/ML IJ SOLN
INTRAMUSCULAR | Status: AC
  Administered 2014-04-05: 2 mg
  Filled 2014-04-05: qty 1

## 2014-04-05 MED ORDER — POTASSIUM CHLORIDE 10 MEQ/50ML IV SOLN
10.0000 meq | INTRAVENOUS | Status: AC
  Administered 2014-04-05 (×4): 10 meq via INTRAVENOUS
  Filled 2014-04-05 (×2): qty 50

## 2014-04-05 MED ORDER — SODIUM CHLORIDE 0.9 % IV SOLN
INTRAVENOUS | Status: DC
  Administered 2014-04-05: 13:00:00 via INTRAVENOUS

## 2014-04-05 MED ORDER — LORAZEPAM 2 MG/ML IJ SOLN
2.0000 mg | Freq: Once | INTRAMUSCULAR | Status: AC
  Administered 2014-04-05: 2 mg via INTRAMUSCULAR

## 2014-04-05 MED ORDER — LEVALBUTEROL HCL 0.63 MG/3ML IN NEBU
0.6300 mg | INHALATION_SOLUTION | RESPIRATORY_TRACT | Status: DC | PRN
  Administered 2014-04-18 – 2014-04-19 (×3): 0.63 mg via RESPIRATORY_TRACT
  Filled 2014-04-05 (×4): qty 3

## 2014-04-05 MED ORDER — DEXTROSE 50 % IV SOLN
INTRAVENOUS | Status: AC
  Administered 2014-04-05: 25 mL
  Filled 2014-04-05: qty 50

## 2014-04-05 MED ORDER — FUROSEMIDE 10 MG/ML IJ SOLN
40.0000 mg | Freq: Two times a day (BID) | INTRAMUSCULAR | Status: DC
  Administered 2014-04-05 – 2014-04-06 (×2): 40 mg via INTRAVENOUS
  Filled 2014-04-05 (×4): qty 4

## 2014-04-05 MED ORDER — FUROSEMIDE 10 MG/ML IJ SOLN
40.0000 mg | Freq: Once | INTRAMUSCULAR | Status: AC
  Administered 2014-04-05: 40 mg via INTRAVENOUS
  Filled 2014-04-05: qty 4

## 2014-04-05 MED ORDER — WHITE PETROLATUM GEL
Status: AC
  Administered 2014-04-05: 0.2
  Filled 2014-04-05: qty 5

## 2014-04-05 MED ORDER — LABETALOL HCL 5 MG/ML IV SOLN
10.0000 mg | INTRAVENOUS | Status: DC | PRN
  Administered 2014-04-05 – 2014-04-09 (×7): 10 mg via INTRAVENOUS
  Filled 2014-04-05 (×6): qty 4

## 2014-04-05 NOTE — Progress Notes (Signed)
Daily Rounding Note  04/05/2014, 9:27 AM  LOS: 2 days   SUBJECTIVE:       Currently NPO.  Note fluids stopped last night around 7PM due to suspicion of CHFand acute on chronic renal failure. She is NPO.  Remains obtunded.  EEG in progress.   OBJECTIVE:         Vital signs in last 24 hours:    Temp:  [97.7 F (36.5 C)-98.6 F (37 C)] 98 F (36.7 C) (05/18 0821) Pulse Rate:  [73-101] 100 (05/18 0821) Resp:  [0-23] 16 (05/18 0821) BP: (152-219)/(64-137) 197/87 mmHg (05/18 0821) SpO2:  [99 %-100 %] 99 % (05/18 0821) Weight:  [88.6 kg (195 lb 5.2 oz)] 88.6 kg (195 lb 5.2 oz) (05/18 0405) Last BM Date:  (Unsure) General: agitated, confused.    Heart: RRR Chest: clear bil in front Abdomen: soft, NT, active BS, ND  Extremities: no CCE Neuro/Psych:  Not verbal.  Spontaneous shaking of head and jerking in arms, blinking of eyes.   Intake/Output from previous day: 05/17 0701 - 05/18 0700 In: 0  Out: 250 [Urine:250]  Intake/Output this shift:    Lab Results:  Recent Labs  Apr 10, 2014 1034 04-10-14 1105 04/04/14 0513  WBC 9.3  --  9.7  HGB 9.8* 10.5* 9.6*  HCT 29.9* 31.0* 28.5*  PLT 203  --  182   BMET  Recent Labs  10-Apr-2014 1023 04/10/2014 1105 04/04/14 0513 04/04/14 1753  NA 142 139 142 143  K 3.6* 3.4* 3.1* 3.3*  CL 103 106 106 107  CO2 21  --  22 21  GLUCOSE 93 93 95 101*  BUN 46* 41* 45* 43*  CREATININE 4.29* 4.80* 4.23* 4.25*  CALCIUM 11.6*  --  11.0* 10.8*   LFT  Recent Labs  2014-04-10 1023 04/04/14 0513  PROT 7.0  --   ALBUMIN 2.4* 2.2*  AST 71*  --   ALT 29  --   ALKPHOS 64  --   BILITOT 0.3  --    PT/INR  Recent Labs  Apr 10, 2014 1855  LABPROT 16.2*  INR 1.33   Hepatitis Panel No results found for this basename: HEPBSAG, HCVAB, HEPAIGM, HEPBIGM,  in the last 72 hours  Studies/Results: Ct Abdomen Pelvis Wo Contrast 04/10/2014     FINDINGS: Patchy bibasilar pulmonary opacities are  compatible with early airspace disease or scattered subsegmental atelectasis.  Motion artifact severely degrades exam.  Postcholecystectomy.  Small calcified granulomata in the liver.  Spleen is within normal limits.  Adrenal glands are unremarkable.  The pancreas is ill-defined. There is stranding about the head of the pancreas some blurring of the fat planes. Inflammatory changes are not excluded. Trace free fluid in the right pericolic gutter.  No hydronephrosis.  Simple cysts in the kidneys.  Bladder is distended.  Uterus and adnexa are within normal limits.  The destructive process at T10-T11 secondary to osteomyelitis and discitis is again noted. Spinal stenosis at this level is re- demonstrated.  IMPRESSION: Limited exam secondary to motion artifact  Findings suggest pancreatitis. Correlate with amylase and lipase levels.  Bibasilar atelectasis versus airspace disease.   Electronically Signed   By: Maryclare Bean M.D.   On: 2014-04-10 12:02   Ct Head Wo Contrast 10-Apr-2014     FINDINGS: Global atrophy. Chronic ischemic changes in the periventricular white matter.  Motion artifact severely degrades the study despite multiple attempts at scanning.  Multiple calcifications are seen within the region  of the tentorium. These are likely all extra-axial calcifications. This is difficult to confirm on axial imaging only.  No mass effect, midline shift, or acute intracranial hemorrhage. No obvious skull fracture. Go mucous material is present in the maxillary sinuses.  IMPRESSION: No acute intracranial pathology. Chronic ischemic changes. Motion artifact degrades the study   Electronically Signed   By: Maryclare Bean M.D.   On: 05/02/14 11:57   Mr Brain Wo Contrast 04/04/2014   FINDINGS: Image quality degraded by mild motion.  Generalized atrophy.  Negative for hydrocephalus.  Negative for acute infarct. Mild chronic microvascular ischemic change in the white matter.  Brainstem and cerebellum are normal. Negative for  hemorrhage or mass.  Mild mucosal edema in the paranasal sinuses.  IMPRESSION: No acute abnormality.   Electronically Signed   By: Franchot Gallo M.D.   On: 04/04/2014 12:08   Dg Chest Port 1 View 04/04/2014    FINDINGS: Right upper extremity PICC line is present with tip projecting over the superior vena cava. Stable enlarged cardiac and mediastinal contours. Unchanged bilateral coarse interstitial pulmonary opacities. No pleural effusion or pneumothorax. Extensive bilateral glenohumeral joint degenerative change.  IMPRESSION: Cardiomegaly. Bilateral coarse interstitial opacities suggestive of pulmonary edema.   Electronically Signed   By: Lovey Newcomer M.D.   On: 04/04/2014 19:12   Scheduled Meds: . cloNIDine  0.1 mg Transdermal Weekly  . heparin  5,000 Units Subcutaneous 3 times per day  . insulin aspart  0-9 Units Subcutaneous TID WC  . lidocaine  1 patch Transdermal Q24H   Continuous Infusions:  PRN Meds:.hydrALAZINE, ipratropium-albuterol, morphine injection, sodium chloride   ASSESMENT:   *  Pancreatitis, non-biliary.  Not severe by CT scan. Lipase 1219.  ? Due to hypercalcemia.  GB removed remotely.  Ducts not dilated.   *  Encephalopathy. In status epilecticus.   *  Hypercalcemia.  Work up in progress.   *  UTI.  Insignificant growth on clx of 5/16.  Maxipime discontinued 5/17  *  Osteomyelitis, vertebral.  Currently no abx, stopped due to severely reduced GFR/ARF.  On Cefepime at admission    *  ARF   PLAN   *  Lipase 1219.  Needs some sort of IVF, could place NGT and start TF but will need to prevent pt from pulling this out.     Vena Rua  04/05/2014, 9:27 AM Pager: [phone]

## 2014-04-05 NOTE — Progress Notes (Signed)
Called report to Rogers, RN on .  Updated on patient history, current status, and plan of care.  Patient is stable and able to transfer at this time.

## 2014-04-05 NOTE — Progress Notes (Signed)
    Day 2 of stay      Patient name: [name] [name]  Medical record number: [id number]  Date of birth: [birth year]   Patient remains in the same condition - not alert, moaning, moving all extremities.  Filed Vitals:   04/04/14 2356 04/05/14 0100 04/05/14 0405 04/05/14 0821  BP: 181/71 152/102 162/64 197/87  Pulse: 100 88 88 100  Temp: 97.9 F (36.6 C)  98 F (36.7 C) 98 F (36.7 C)  TempSrc: Axillary  Axillary Axillary  Resp: 15 0 17 16  Height:      Weight:   195 lb 5.2 oz (88.6 kg)   SpO2: 100% 100% 100% 99%    Recent Labs Lab 04/16/2014 1023 03/19/2014 1105 03/28/2014 1855 04/04/14 0513 04/04/14 1753  NA 142 139  --  142 143  K 3.6* 3.4*  --  3.1* 3.3*  CL 103 106  --  106 107  CO2 21  --   --  22 21  GLUCOSE 93 93  --  95 101*  BUN 46* 41*  --  45* 43*  CREATININE 4.29* 4.80*  --  4.23* 4.25*  CALCIUM 11.6*  --   --  11.0* 10.8*  MG  --   --  2.2  --   --   PHOS  --   --  3.0 3.0  --     Recent Labs Lab 03/25/2014 1034 04/15/2014 1105 04/04/14 0513  HGB 9.8* 10.5* 9.6*  HCT 29.9* 31.0* 28.5*  WBC 9.3  --  9.7  PLT 203  --  182    Assessment and Plan   Seizure - Patient remains in status epilepticus per EEG. Most likely differential for the patient's condition emerges to be cefepime toxicity per Dr Leonel Ramsay. Patient being loaded with dilantin, and ativan. At this point, given other co-morbid conditions, I think the patient should be moved to ICU. Prognosis for this condition is favorable.  Acute Pancreatitis - Fluids have been stopped due to pulmonary edema. Reassess once the acute seizure condition resolves. GI on board, would appreciate advice on handling acute pancreatitis with CHF. With CKD and increased BUN and hypercalemia - risk for necrosis is higher than average.   Acute on chronic CKD - for now, fluids on hold. Would continue to monitor. Renal on board, for need for dialysis given CHF and pancreatitis. However, given the age of the patient, this may not  be a great option.   Discussion about goals of care - will be done with the family today. I met with the daughter with the entire team in the morning. Dr Naaman Plummer plans to meet them during the day to discuss about the patient's condition.     I have discussed the care of this patient with my IM team residents. Please see the resident note for details.  Eupha Lobb 04/05/2014, 11:28 AM.

## 2014-04-05 NOTE — Consult Note (Signed)
PULMONARY / CRITICAL CARE MEDICINE   Name: [name] [name] MRN: [id number] DOB: [birth year]    ADMISSION DATE:  04/05/2014 CONSULTATION DATE:  5/18  REFERRING MD :  Leonel Ramsay  PRIMARY SERVICE: [facility]--> PCCM   CHIEF COMPLAINT:  seizure  BRIEF PATIENT DESCRIPTION: 80 yo female with hx HTN, DM, pancreatitis, CKD4, vertebral osteomyelitis on prolonged IV abx, recently changed to Cefepime admitted 5/16 with AMS.  Evaluated by neurology and continuous EEG revealed status epilepticus and PCCM consulted for tx to ICU and potential need for ETT/ propofol.   SIGNIFICANT EVENTS / STUDIES:  5/18 Continuous EEG>>> NCSE  LINES / TUBES: RUE PICC (pta)>>>  CULTURES: Urine 5/16 >>>neg  BCx2 5/16>>>  ANTIBIOTICS:   HISTORY OF PRESENT ILLNESS:  80 yo female with hx HTN, DM, CKD4, vertebral osteomyelitis on prolonged IV abx, recently changed to Cefepime admitted 5/16 with AMS.  Evaluated by neurology and continuous EEG revealed status epilepticus and PCCM consulted for tx to ICU and potential need for ETT/ propofol.  Per family was living independently, driving prior to January when vertebral osteomyelitis began.  Usually awake, alert, appropriate, making progress with PT.    PAST MEDICAL HISTORY :  Past Medical History  Diagnosis Date  . Hypertension   . Gout   . Hypercholesteremia   . Diabetes mellitus   . CHF (congestive heart failure)   . Renal disorder   . Cancer   . Edema   . Chronic diastolic CHF (congestive heart failure), NYHA class 2 12/27/2013  . Hypertensive heart disease 12/28/2013  . Pleural effusion on right 12/13/2013  . HCAP (healthcare-associated pneumonia) 12/22/2013  . Osteomyelitis of thoracic spine 11/29/2013  . Discitis of thoracic region 11/29/2013  . CKD (chronic kidney disease) stage 4, GFR 15-29 ml/min 11/20/2013  . Hyperparathyroidism 01/10/2014  . Generalized weakness 11/30/2013   Past Surgical History  Procedure Laterality Date  . Cesarean section    . Rotator cuff  repair    . Tonsillectomy    . Tubal ligation     Prior to Admission medications   Medication Sig Start Date End Date Taking? Authorizing Provider  acetaminophen (TYLENOL) 325 MG tablet Take 650 mg by mouth every 4 (four) hours as needed for fever. *takes if temperature over 99.5*   Yes Historical Provider, MD  carvedilol (COREG) 25 MG tablet Take 1 tablet (25 mg total) by mouth 2 (two) times daily with a meal. 12/21/13  Yes Cherene Altes, MD  ceFEPIme 2 g in dextrose 5 % 50 mL Inject 2 g into the vein every 12 (twelve) hours. 03/17/14  Yes Truman Hayward, MD  cloNIDine (CATAPRES) 0.1 MG tablet Take 0.1 mg by mouth 2 (two) times daily.   Yes Historical Provider, MD  DAPTOmycin (CUBICIN) 500 MG injection Inject 10.8 mLs (540 mg total) into the vein every other day. 03/17/14  Yes Truman Hayward, MD  DULoxetine (CYMBALTA) 60 MG capsule Take 60 mg by mouth daily.   Yes Historical Provider, MD  feeding supplement, ENSURE, (ENSURE) PUDG Take 1 Container by mouth 3 (three) times daily between meals. 01/01/14  Yes Barton Dubois, MD  hydrALAZINE (APRESOLINE) 50 MG tablet Take 50 mg by mouth 4 (four) times daily.   Yes Historical Provider, MD  HYDROcodone-acetaminophen (NORCO) 10-325 MG per tablet Take 1 tablet by mouth every 6 (six) hours as needed for moderate pain. 01/01/14  Yes Barton Dubois, MD  isosorbide mononitrate (IMDUR) 15 mg TB24 24 hr tablet Take 0.5 tablets (15  mg total) by mouth daily. 01/01/14  Yes Barton Dubois, MD  lidocaine (LIDODERM) 5 % Place 1 patch onto the skin daily. Remove & Discard patch within 12 hours or as directed by MD   Yes Historical Provider, MD  Multiple Vitamins-Minerals (MULTIVITAMIN WITH MINERALS) tablet Take 1 tablet by mouth daily.   Yes Historical Provider, MD  ondansetron (ZOFRAN) 4 MG tablet Take 4 mg by mouth every 8 (eight) hours as needed for nausea.  02/05/14  Yes Historical Provider, MD  senna (SENOKOT) 8.6 MG TABS tablet Take 2 tablets by mouth daily  as needed.    Yes Historical Provider, MD  torsemide (DEMADEX) 20 MG tablet Take 1 tablet (20 mg total) by mouth daily. 02/13/14  Yes Gerlene Fee, NP  vitamin C (ASCORBIC ACID) 500 MG tablet Take 500 mg by mouth daily.   Yes Historical Provider, MD  ipratropium-albuterol (DUONEB) 0.5-2.5 (3) MG/3ML SOLN Take 3 mLs by nebulization every 2 (two) hours as needed (shortness of breath).    Historical Provider, MD   Allergies  Allergen Reactions  . Motrin [Ibuprofen] Hives    FAMILY HISTORY:  Family History  Problem Relation Age of Onset  . Hypertension Mother   . Hypertension Father   . Hypertension Daughter    SOCIAL HISTORY:  reports that she quit smoking about 34 years ago. Her smoking use included Cigarettes. She has a 10 pack-year smoking history. She has never used smokeless tobacco. She reports that she does not drink alcohol or use illicit drugs.  REVIEW OF SYSTEMS:  Unable.     VITAL SIGNS: Temp:  [97.7 F (36.5 C)-98.6 F (37 C)] 98 F (36.7 C) (05/18 0821) Pulse Rate:  [73-101] 100 (05/18 0821) Resp:  [0-23] 16 (05/18 0821) BP: (152-219)/(64-137) 197/87 mmHg (05/18 0821) SpO2:  [99 %-100 %] 99 % (05/18 0821) Weight:  [195 lb 5.2 oz (88.6 kg)] 195 lb 5.2 oz (88.6 kg) (05/18 0405) HEMODYNAMICS:   VENTILATOR SETTINGS:   INTAKE / OUTPUT: Intake/Output     05/17 0701 - 05/18 0700 05/18 0701 - 05/19 0700   I.V. (mL/kg)     Total Intake(mL/kg)     Urine (mL/kg/hr) 250 (0.1)    Total Output 250     Net -250          Urine Occurrence 1 x    Stool Occurrence 1 x      PHYSICAL EXAMINATION: General:  Chronically ill appearing female, NAD Neuro:  Altered, repetitive spontaneous movement LUE, moans, no obvious seizure activity HEENT:  Mm dry, no JVD< neck supple Cardiovascular:  s1s2 rrr, mild tachy Lungs:  resps even non labored, protecting airway, diminished bases  Abdomen:  Soft, +bs Musculoskeletal:  Warm and dry, no edema  LABS:  CBC  Recent Labs Lab  03/22/2014 1034 04/01/2014 1105 04/04/14 0513  WBC 9.3  --  9.7  HGB 9.8* 10.5* 9.6*  HCT 29.9* 31.0* 28.5*  PLT 203  --  182   Coag's  Recent Labs Lab 04/04/2014 1855  APTT 37  INR 1.33   BMET  Recent Labs Lab 03/31/2014 1023 04/02/2014 1105 04/04/14 0513 04/04/14 1753  NA 142 139 142 143  K 3.6* 3.4* 3.1* 3.3*  CL 103 106 106 107  CO2 21  --  22 21  BUN 46* 41* 45* 43*  CREATININE 4.29* 4.80* 4.23* 4.25*  GLUCOSE 93 93 95 101*   Electrolytes  Recent Labs Lab 04/11/2014 1023 04/02/2014 1855 04/04/14 0513 04/04/14 1753  CALCIUM  11.6*  --  11.0* 10.8*  MG  --  2.2  --   --   PHOS  --  3.0 3.0  --    Sepsis Markers  Recent Labs Lab May 01, 2014 1106  LATICACIDVEN 0.64   ABG No results found for this basename: PHART, PCO2ART, PO2ART,  in the last 168 hours Liver Enzymes  Recent Labs Lab 05/01/2014 1023 04/04/14 0513  AST 71*  --   ALT 29  --   ALKPHOS 64  --   BILITOT 0.3  --   ALBUMIN 2.4* 2.2*   Cardiac Enzymes  Recent Labs Lab 04/04/14 0513 04/04/14 1230  TROPONINI <0.30 <0.30  PROBNP  --  6433.0*   Glucose  Recent Labs Lab 2014-05-01 2144 04/04/14 0611 04/04/14 1243 04/04/14 1600 04/04/14 1750 04/05/14 0824  GLUCAP 70 102* 83 100* 99 74    Imaging Ct Abdomen Pelvis Wo Contrast  01-May-2014   CLINICAL DATA:  Altered level of consciousness  EXAM: CT ABDOMEN AND PELVIS WITHOUT CONTRAST  TECHNIQUE: Multidetector CT imaging of the abdomen and pelvis was performed following the standard protocol without IV contrast.  COMPARISON:  None.  FINDINGS: Patchy bibasilar pulmonary opacities are compatible with early airspace disease or scattered subsegmental atelectasis.  Motion artifact severely degrades exam.  Postcholecystectomy.  Small calcified granulomata in the liver.  Spleen is within normal limits.  Adrenal glands are unremarkable.  The pancreas is ill-defined. There is stranding about the head of the pancreas some blurring of the fat planes.  Inflammatory changes are not excluded. Trace free fluid in the right pericolic gutter.  No hydronephrosis.  Simple cysts in the kidneys.  Bladder is distended.  Uterus and adnexa are within normal limits.  The destructive process at T10-T11 secondary to osteomyelitis and discitis is again noted. Spinal stenosis at this level is re- demonstrated.  IMPRESSION: Limited exam secondary to motion artifact  Findings suggest pancreatitis. Correlate with amylase and lipase levels.  Bibasilar atelectasis versus airspace disease.   Electronically Signed   By: Maryclare Bean M.D.   On: May 01, 2014 12:02   Ct Head Wo Contrast  05/01/14   CLINICAL DATA:  Altered level of consciousness  EXAM: CT HEAD WITHOUT CONTRAST  TECHNIQUE: Contiguous axial images were obtained from the base of the skull through the vertex without intravenous contrast.  COMPARISON:  None.  FINDINGS: Global atrophy. Chronic ischemic changes in the periventricular white matter.  Motion artifact severely degrades the study despite multiple attempts at scanning.  Multiple calcifications are seen within the region of the tentorium. These are likely all extra-axial calcifications. This is difficult to confirm on axial imaging only.  No mass effect, midline shift, or acute intracranial hemorrhage. No obvious skull fracture. Go mucous material is present in the maxillary sinuses.  IMPRESSION: No acute intracranial pathology. Chronic ischemic changes. Motion artifact degrades the study   Electronically Signed   By: Maryclare Bean M.D.   On: 05/01/2014 11:57   Mr Brain Wo Contrast  04/04/2014   CLINICAL DATA:  Encephalopathy  EXAM: MRI HEAD WITHOUT CONTRAST  TECHNIQUE: Multiplanar, multiecho pulse sequences of the brain and surrounding structures were obtained without intravenous contrast.  COMPARISON:  CT 05/01/2014  FINDINGS: Image quality degraded by mild motion.  Generalized atrophy.  Negative for hydrocephalus.  Negative for acute infarct. Mild chronic microvascular  ischemic change in the white matter.  Brainstem and cerebellum are normal. Negative for hemorrhage or mass.  Mild mucosal edema in the paranasal sinuses.  IMPRESSION: No acute  abnormality.   Electronically Signed   By: Franchot Gallo M.D.   On: 04/04/2014 12:08   Dg Chest Port 1 View  04/04/2014   CLINICAL DATA:  CHF  EXAM: PORTABLE CHEST - 1 VIEW  COMPARISON:  DG CHEST 1V PORT dated 04/09/2014; DG CHEST 1V PORT dated 12/16/2013  FINDINGS: Right upper extremity PICC line is present with tip projecting over the superior vena cava. Stable enlarged cardiac and mediastinal contours. Unchanged bilateral coarse interstitial pulmonary opacities. No pleural effusion or pneumothorax. Extensive bilateral glenohumeral joint degenerative change.  IMPRESSION: Cardiomegaly. Bilateral coarse interstitial opacities suggestive of pulmonary edema.   Electronically Signed   By: Lovey Newcomer M.D.   On: 04/04/2014 19:12     ASSESSMENT / PLAN:  NEUROLOGIC AMS Seizure  Status epilepticus -attributed to cefepime neurotoxicity P:   Neuro following  Continuous EEG  Ativan PRN  depakote per neuro  Tx ICU  May ultimately need propofol/ ETT    PULMONARY Risk for resp failure - in setting status epilepticus  P:   Cont monitor for airway protection Will need intubation if fails ativan and propofol needed  - intubate if requires propofol - defer to neuro to tell us if so F/u CXR  Monitor with gentle volume - mild pulm edema   CARDIOVASCULAR HTN  Diastolic dysfunction - EF 60-65% P:  labetalol PRN  Cont clonidine patch  D/c hydralazine - concern reflex tachycardia, already tachy   RENAL Acute on CKD4 - liekly r/t volume depletion and pancreatitis Hypokalemia  P:   No indication HD currently , not a great candidate for HD in general Renal following  F/u chem  Replete K gently as needed  Gentle volume   Urine studies pending  F/u CK's  GASTROINTESTINAL Pancreatitis - non biliary.  Lipase 1219.  P:    Place NG tube for meds  GI following  F/u amylase, lipase   HEMATOLOGIC Anemia - mild  P:  F/u cbc  SQ heparin    INFECTIOUS Vertebral osteomyelitis  UTI P:   ID following  abx on hold with status epilepticus, ?r/t cefepime   ENDOCRINE DM   P:   SSI   Daughter updated at bedside at length.   Summary - Pancreatitis , non biliary, acute on CKD while being treated for vertebral osteo now with acute encephalopathy due to status -perhaps antibiotic toxicity or other cause   Nickolas Madrid, NP 04/05/2014  11:34 AM Pager: [phone] or [phone]  *Care during the described time interval was provided by me and/or other providers on the critical care team. I have reviewed this patient's available data, including medical history, events of note, physical examination and test results as part of my evaluation.  I have personally obtained a history, examined the patient, evaluated laboratory and imaging results, formulated the assessment and plan and placed orders. CRITICAL CARE: The patient is critically ill with multiple organ systems failure and requires high complexity decision making for assessment and support, frequent evaluation and titration of therapies, application of advanced monitoring technologies and extensive interpretation of multiple databases. Critical Care Time devoted to patient care services described in this note is 50 minutes.   Kara Mead MD. Shade Flood. [facility] Pager [phone] If no response call [phone]

## 2014-04-05 NOTE — Progress Notes (Signed)
INITIAL NUTRITION ASSESSMENT  DOCUMENTATION CODES Per approved criteria  -Severe malnutrition in the context of acute illness or injury -Obesity Unspecified   INTERVENTION: Advance diet as medically appropriate vs TF initiation  RD to follow for nutrition care plan  NUTRITION DIAGNOSIS: Inadequate oral intake related to inability to eat as evidenced by NPO status  Goal: Pt to meet >/= 90% of their estimated nutrition needs   Monitor:  PO diet advancement vs TF initiation, weight, labs, I/O's  Reason for Assessment: Low Braden  79 y.o. female  Admitting Dx: Seizures  ASSESSMENT: 80 year old female with PMH of HTN, non-insulin dependent Type II DM, CHF, CKD Stage 4, and osteomyelitis; presented from [facility] facility with altered mental status.  MRI head without significant disease.  Neurology Assessment  ----> altered mental status with NCSE on EEG in the setting of cefepime.  Patient transferred from 2C-Stepdown to 63M-Neuro ICU 5/18 for airway management.  RD spoke with patient's daughter at bedside; report her Mom wasn't eating well for approximately 3 weeks PTA (at [facility]); pt would consume breakfast (eggs, bacon and coffee), however, not eat lunch or dinner; daughter also reports patient has lost 11 lbs during this time frame; per weight readings, pt has had a 14% weight loss since February 2015 (severe for time frame); patient does like Glucerna Shake oral supplement.  GI note reviewed 5/18.  Low braden score places patient at risk for skin breakdown.  No muscle or subcutaneous fat depletion noticed.  Patient meets criteria for severe malnutrition in the context of acute illness of injury as evidenced by < 50% intake of estimated energy requirement for > 5 days and 14% weight loss x 3 months.  Height: Ht Readings from Last 1 Encounters:  03/27/2014 5\' 2"  (1.575 m)    Weight: Wt Readings from Last 1 Encounters:  04/05/14 195 lb 5.2 oz (88.6 kg)     Ideal Body Weight: 110 lb  % Ideal Body Weight: 177%  Wt Readings from Last 10 Encounters:  04/05/14 195 lb 5.2 oz (88.6 kg)  04/02/14 199 lb 11.2 oz (90.583 kg)  03/19/14 198 lb 12.8 oz (90.175 kg)  03/17/14 198 lb (89.812 kg)  03/01/14 198 lb (89.812 kg)  02/04/14 200 lb 12.8 oz (91.082 kg)  01/29/14 201 lb 8 oz (91.4 kg)  01/20/14 205 lb 12.8 oz (93.35 kg)  01/18/14 210 lb 1.6 oz (95.301 kg)  01/07/14 227 lb 11.2 oz (103.284 kg)    Usual Body Weight: 198 lb  % Usual Body Weight: 98%  BMI:  Body mass index is 35.72 kg/(m^2).  Estimated Nutritional Needs: Kcal: 1850-2050 Protein: 90-100 gm Fluid: 1.8-2.0 L  Skin: Intact  Diet Order: NPO  EDUCATION NEEDS: -No education needs identified at this time   Intake/Output Summary (Last 24 hours) at 04/05/14 1325 Last data filed at 04/05/14 0403  Gross per 24 hour  Intake      0 ml  Output    250 ml  Net   -250 ml    Labs:   Recent Labs Lab 04/07/2014 1023 04/17/2014 1105 03/29/2014 1855 04/04/14 0513 04/04/14 1753  NA 142 139  --  142 143  K 3.6* 3.4*  --  3.1* 3.3*  CL 103 106  --  106 107  CO2 21  --   --  22 21  BUN 46* 41*  --  45* 43*  CREATININE 4.29* 4.80*  --  4.23* 4.25*  CALCIUM 11.6*  --  11.1* 11.0*  10.8*  MG  --   --  2.2  --   --   PHOS  --   --  3.0 3.0  --   GLUCOSE 93 93  --  95 101*    CBG (last 3)   Recent Labs  04/04/14 1600 04/04/14 1750 04/05/14 0824  GLUCAP 100* 99 74    Scheduled Meds: . cloNIDine  0.1 mg Transdermal Weekly  . furosemide  40 mg Intravenous Q12H  . heparin  5,000 Units Subcutaneous 3 times per day  . insulin aspart  0-9 Units Subcutaneous TID WC  . lidocaine  1 patch Transdermal Q24H  . potassium chloride  10 mEq Intravenous Q1 Hr x 4    Continuous Infusions: . sodium chloride      Past Medical History  Diagnosis Date  . Hypertension   . Gout   . Hypercholesteremia   . Diabetes mellitus   . CHF (congestive heart failure)   . Renal disorder    . Cancer   . Edema   . Chronic diastolic CHF (congestive heart failure), NYHA class 2 12/27/2013  . Hypertensive heart disease 12/28/2013  . Pleural effusion on right 12/13/2013  . HCAP (healthcare-associated pneumonia) 12/22/2013  . Osteomyelitis of thoracic spine 11/29/2013  . Discitis of thoracic region 11/29/2013  . CKD (chronic kidney disease) stage 4, GFR 15-29 ml/min 11/20/2013  . Hyperparathyroidism 01/10/2014  . Generalized weakness 11/30/2013    Past Surgical History  Procedure Laterality Date  . Cesarean section    . Rotator cuff repair    . Tonsillectomy    . Tubal ligation      Arthur Holms, RD, LDN Pager #: [phone] After-Hours Pager #: [phone]

## 2014-04-05 NOTE — Progress Notes (Addendum)
Subjective:  Pt seen and examined in AM. Pt yesterday with hypertension (219/135) that responded to hydralazine and morphine. Mental status still unchanged, possibly responsive to name per daughter.     Objective: Vital signs in last 24 hours: Filed Vitals:   04/04/14 2356 04/05/14 0100 04/05/14 0405 04/05/14 0821  BP: 181/71 152/102 162/64 197/87  Pulse: 100 88 88 100  Temp: 97.9 F (36.6 C)  98 F (36.7 C) 98 F (36.7 C)  TempSrc: Axillary  Axillary Axillary  Resp: 15 0 17 16  Height:      Weight:   88.6 kg (195 lb 5.2 oz)   SpO2: 100% 100% 100% 99%   Weight change: 0.239 kg (8.4 oz)  Intake/Output Summary (Last 24 hours) at 04/05/14 1043 Last data filed at 04/05/14 0403  Gross per 24 hour  Intake      0 ml  Output    250 ml  Net   -250 ml   Physical Examination: General: Restless and moaning  Eyes: PERRL Neck: Normal range of motion. Neck supple.  Heart: Normal rate and regular rhythm.  Lungs: Breathing comfortably on 2L [place]. Anterior lung fields coarse. No wheezing.  Abdominal: Soft, possible epigastric & LUQ abdominal tenderness.  Minimal BS. No rebound or guarding.  Extremities: No edema.  Neurological: Alert, non-verbal, unable to follow commands, occasionally opens eyes, moving extremities    Lab Results: Basic Metabolic Panel:  Recent Labs Lab 04/08/2014 1105 03/29/2014 1855 04/04/14 0513 04/04/14 1753  NA 139  --  142 143  K 3.4*  --  3.1* 3.3*  CL 106  --  106 107  CO2  --   --  22 21  GLUCOSE 93  --  95 101*  BUN 41*  --  45* 43*  CREATININE 4.80*  --  4.23* 4.25*  CALCIUM  --   --  11.0* 10.8*  MG  --  2.2  --   --   PHOS  --  3.0 3.0  --    Liver Function Tests:  Recent Labs Lab 03/24/2014 1023 04/04/14 0513  AST 71*  --   ALT 29  --   ALKPHOS 64  --   BILITOT 0.3  --   PROT 7.0  --   ALBUMIN 2.4* 2.2*    Recent Labs Lab 03/25/2014 1023  LIPASE 1219*    Recent Labs Lab 04/15/2014 1855  AMMONIA 21   CBC:  Recent  Labs Lab 03/22/2014 1034 04/16/2014 1105 04/04/14 0513  WBC 9.3  --  9.7  NEUTROABS 5.9  --  6.1  HGB 9.8* 10.5* 9.6*  HCT 29.9* 31.0* 28.5*  MCV 84.7  --  83.8  PLT 203  --  182   Cardiac Enzymes:  Recent Labs Lab 04/12/2014 1855 04/04/14 0513 04/04/14 1230  CKTOTAL 584* 621*  --   TROPONINI  --  <0.30 <0.30   CBG:  Recent Labs Lab 03/27/2014 2144 04/04/14 0611 04/04/14 1243 04/04/14 1600 04/04/14 1750 04/05/14 0824  GLUCAP 70 102* 83 100* 99 74   Hemoglobin A1C:  Recent Labs Lab 04/13/2014 1855  HGBA1C 6.0*   Fasting Lipid Panel:  Recent Labs Lab 04/07/2014 2000  CHOL 238*  HDL 56  LDLCALC 167*  TRIG 77  CHOLHDL 4.3   Thyroid Function Tests:  Recent Labs Lab 04/11/2014 1855  TSH 1.050   Coagulation:  Recent Labs Lab 04/13/2014 1855  LABPROT 16.2*  INR 1.33   Anemia Panel:  Recent Labs Lab  04/04/14 0513  VITAMINB12 815  FOLATE >20.0  FERRITIN 253  TIBC 135*  IRON 33*  RETICCTPCT 2.1   Urinalysis:  Recent Labs Lab 04/05/2014 1053  COLORURINE YELLOW  LABSPEC 1.014  PHURINE 6.5  GLUCOSEU NEGATIVE  HGBUR MODERATE*  BILIRUBINUR NEGATIVE  KETONESUR 15*  PROTEINUR 100*  UROBILINOGEN 0.2  NITRITE NEGATIVE  LEUKOCYTESUR LARGE*     Studies/Results: Ct Abdomen Pelvis Wo Contrast  04/18/2014   CLINICAL DATA:  Altered level of consciousness  EXAM: CT ABDOMEN AND PELVIS WITHOUT CONTRAST  TECHNIQUE: Multidetector CT imaging of the abdomen and pelvis was performed following the standard protocol without IV contrast.  COMPARISON:  None.  FINDINGS: Patchy bibasilar pulmonary opacities are compatible with early airspace disease or scattered subsegmental atelectasis.  Motion artifact severely degrades exam.  Postcholecystectomy.  Small calcified granulomata in the liver.  Spleen is within normal limits.  Adrenal glands are unremarkable.  The pancreas is ill-defined. There is stranding about the head of the pancreas some blurring of the fat planes.  Inflammatory changes are not excluded. Trace free fluid in the right pericolic gutter.  No hydronephrosis.  Simple cysts in the kidneys.  Bladder is distended.  Uterus and adnexa are within normal limits.  The destructive process at T10-T11 secondary to osteomyelitis and discitis is again noted. Spinal stenosis at this level is re- demonstrated.  IMPRESSION: Limited exam secondary to motion artifact  Findings suggest pancreatitis. Correlate with amylase and lipase levels.  Bibasilar atelectasis versus airspace disease.   Electronically Signed   By: Maryclare Bean M.D.   On: 04/18/2014 12:02   Ct Head Wo Contrast  03/23/2014   CLINICAL DATA:  Altered level of consciousness  EXAM: CT HEAD WITHOUT CONTRAST  TECHNIQUE: Contiguous axial images were obtained from the base of the skull through the vertex without intravenous contrast.  COMPARISON:  None.  FINDINGS: Global atrophy. Chronic ischemic changes in the periventricular white matter.  Motion artifact severely degrades the study despite multiple attempts at scanning.  Multiple calcifications are seen within the region of the tentorium. These are likely all extra-axial calcifications. This is difficult to confirm on axial imaging only.  No mass effect, midline shift, or acute intracranial hemorrhage. No obvious skull fracture. Go mucous material is present in the maxillary sinuses.  IMPRESSION: No acute intracranial pathology. Chronic ischemic changes. Motion artifact degrades the study   Electronically Signed   By: Maryclare Bean M.D.   On: 04/04/2014 11:57   Mr Brain Wo Contrast  04/04/2014   CLINICAL DATA:  Encephalopathy  EXAM: MRI HEAD WITHOUT CONTRAST  TECHNIQUE: Multiplanar, multiecho pulse sequences of the brain and surrounding structures were obtained without intravenous contrast.  COMPARISON:  CT 04/05/2014  FINDINGS: Image quality degraded by mild motion.  Generalized atrophy.  Negative for hydrocephalus.  Negative for acute infarct. Mild chronic microvascular  ischemic change in the white matter.  Brainstem and cerebellum are normal. Negative for hemorrhage or mass.  Mild mucosal edema in the paranasal sinuses.  IMPRESSION: No acute abnormality.   Electronically Signed   By: Franchot Gallo M.D.   On: 04/04/2014 12:08   Dg Chest Port 1 View  04/04/2014   CLINICAL DATA:  CHF  EXAM: PORTABLE CHEST - 1 VIEW  COMPARISON:  DG CHEST 1V PORT dated 03/27/2014; DG CHEST 1V PORT dated 12/16/2013  FINDINGS: Right upper extremity PICC line is present with tip projecting over the superior vena cava. Stable enlarged cardiac and mediastinal contours. Unchanged bilateral coarse interstitial pulmonary opacities.  No pleural effusion or pneumothorax. Extensive bilateral glenohumeral joint degenerative change.  IMPRESSION: Cardiomegaly. Bilateral coarse interstitial opacities suggestive of pulmonary edema.   Electronically Signed   By: Lovey Newcomer M.D.   On: 04/04/2014 19:12   Dg Chest Port 1 View  03/23/2014   CLINICAL DATA:  Altered mental status  EXAM: PORTABLE CHEST - 1 VIEW  COMPARISON:  Chest radiograph December 27, 2013 and chest CT January 19, 2014  FINDINGS: There is a central catheter with tip in the superior vena cava near the cavoatrial junction. No pneumothorax. There is mild interstitial edema. Heart is borderline enlarged with normal pulmonary vascularity. No airspace consolidation.  IMPRESSION: Evidence of a degree of congestive heart failure. No consolidation. No pneumothorax.   Electronically Signed   By: Lowella Grip M.D.   On: 03/24/2014 11:21   Medications: I have reviewed the patient's current medications. Scheduled Meds: . cloNIDine  0.1 mg Transdermal Weekly  . heparin  5,000 Units Subcutaneous 3 times per day  . insulin aspart  0-9 Units Subcutaneous TID WC  . lidocaine  1 patch Transdermal Q24H  . valproate sodium  2.5 g Intravenous STAT   Continuous Infusions:   PRN Meds:.hydrALAZINE, ipratropium-albuterol, morphine injection, sodium  chloride Assessment/Plan:  Acute Encephalopathy in setting of Non-convulsive Status Epilepticus - A little more alert but still restless with nonpurposeable movements, unable to follow commands, but able to open eyes.  Most likely due to cefepime neuro toxicity vs hypercalcemia. In addition pt with acute pancreatitis, complicated cystitis, possible uremia, and osteomyelitis. Pt with questionable sarcoidosis.  -Appreciate neurology recommendations -Transfer to neuro ICU -Continuous EEG -Ativan 2 mg, repeat as needed -Load with keppra 2.5 gm -Oxygen therapy to keep SpO2 > 92%  -Duoneb Q 2 hr PRN  -NPO   -F/U blood cultures  --> NGTD -No antibiotics per ID consult -Avoid sedative medications  -Hold home cymbalta 60 mg daily  -Hold home norco 10-325 mg Q 6 hr PRN   Acute Chronic Grade 1 Diastolic CHF - CXR on 3/81 with pulmonary edema.   Last 2D -echo on 12/17/13 with EF 60-65% and grade 1 diastolic dysfunction. CXR with mild interstitial edema. Pt appears hypovolemic on exam.  -Hold home torsemide 20 mg daily  -Hold home imdur 15 mg daily  -Monitor daily weights and strict I & O's  -Obtain pro-BNP --> 6433 (7200 3 months ago) -Consider IV lasix   Acute Non-Biliary Pancreatitis - Lipase improved to 247 from 1219 on 5/16. Etiology unclear, possibly due to uncontrolled hypercalcemia.   -Appreciate GI recommendations -NPO  -D/C D5 NS 100 mL/hr, consider restarting, possible NGT  -IV morphine 1-2 mg Q 4 hr PRN pain  -Repeat lipase level in AM  AKI on CKD Stage 4 - Cr 4.46, above 4.29 on admission and above baseline 1.9. Etiology likely due to prerenal azotemia in setting of hypovolemia from GI loss (vomiting) vs ATN (vanomycin, cefepime, elevated CK)    -Appreciate nephrology recommendations -Obtain UNa, UCr, to calculate FeNa --> 4% -Urinary eosinophils  -Avoid nephrotoxins  -Hold home torsemide 20 mg daily  -Monitor BMP  -Keep foley catheter   Hypokalemia - K 3.3 this AM. Magnesium  levels normal. Etiology due to decreased PO intake and recent GI loss (vomiting).  -Replete with caution in setting of CKD -Monitor BMP  Complicated Cystitis - UA with large leukocytes, pyuria (21-50), and bacteriuria. Also with moderate Hg. Pt received ciprofloxacin in ED. Pt with recent urinary incontinence and bladder distension on CT  imaging.  -F/U urine cultures --> 3K, no significant growth -Place foley catheter   Osteomyelitis of T10-T11 - ESR & CRP elevated. Currently with no sepsis criteria. Diagnosed Jan 2015. Pt has received 2 weeks of IV ceftriaxone and vancomycin; doxycyline and amoxicillin; and now IV cefepime and daptomycin since 4/29 for 6 weeks. CT revealing destructive process at T10-T11 secondary to osteomyelitis and discitis. PICC line placed about 3 weeks ago.  -Appreciate ID recommendations -Consider re-imaging of thoracic spine  -Obtain ESR & CRP -->ESR 42 to 103 (1 month), CRP <0.5 to 2.9 (2 weeks ago)  Hypercalcemia in setting of secondary hyperparathyroidism with vitamin insufficiency - Corrected calcium 12.9. Last PTH with 176.8 (H), vitamin D25-OH 26 (L), 1-25OH 16 (L), Also questionable sarcoidosis per pulmonology.  -D/C D5 NS 125 mL/hr, consider restarting -Obtain PTH (163.8 H), 25-OH Vitamin D (23 L)  -Obtain SPEP, kappa:lamba light chains (ratio wnl)  Hypertension - Currently hypertensive. Pt not on home medications since 5/15.  -Clonidine 0.1 mg patch weekly (at home on clonidine 0.1 mg BID)  -Hold home carvedilol 25 mg BID  -Hold home torsemide 20 mg daily  -Hold imdur 15 mg daily  -Hold hydralazine 50 mg QID -IV labetolol 10 mg PRN  Chronic Normocytic Anemia - Pt with Hg near baseline 9 with no hemodynamic instability or active bleeding.  -Monitor for bleeding  -Monitor CBC  -Obtain anemia panel 5/17 --> reticulocytes (wnl), ferritin(253), iron (33 L), TIB (135 L), Iron sat (24%), folate (wnl), B12 (wnl) -Transfuse if Hg <7   Non-insulin dependent  Type II DM - Last A1c of 6 on 5/16. Pt does not appear to be on medications at home.  -Sensitive SSI  -CBG monitoring before meals & at bedtime   Goals of Care: Discussed with daughter who expressed her wishes to forgo consultation with Palliative at this time until prognosis becomes more apparent. Desires aggressive measures at this time for recovery and code status confirmed to be full code.   Diet: NPO  DVT Ppx: SQ heparin TID  Code: Full   Dispo: Disposition is deferred at this time, awaiting improvement of current medical problems.  The patient does have a current PCP (No Pcp Per Patient) and does need an [facility] hospital follow-up appointment after discharge.  The patient does have transportation limitations that hinder transportation to clinic appointments.  .Services Needed at time of discharge: Y = Yes, Blank = No PT:   OT:   RN:   Equipment:   Other:     LOS: 2 days   Juluis Mire, MD 04/05/2014, 10:43 AM

## 2014-04-05 NOTE — Progress Notes (Signed)
LTM EEG started in 2C11. Pt moved to 3M03 and EEG and machine moved and resumed.

## 2014-04-05 NOTE — Progress Notes (Signed)
Clinical Social Work Department BRIEF PSYCHOSOCIAL ASSESSMENT 04/05/2014  Patient:  [name] [name], [name] [name]     Account Number:  [id number]     Admit date:  Apr 07, 2014  Clinical Social Worker:  Freeman Caldron  Date/Time:  04/05/2014 01:18 PM  Referred by:  Physician  Date Referred:  04/05/2014 Referred for  SNF Placement   Other Referral:   Interview type:  Family Other interview type:    PSYCHOSOCIAL DATA Living Status:  FACILITY Admitted from facility:  [facility] [place] Level of care:  [place] Primary support name:  [name] [name] [phone]) Primary support relationship to patient:  CHILD, ADULT Degree of support available:   Good--pt has support from daughter [name] [name] and was living at [facility] prior to hospitalization.    CURRENT CONCERNS Current Concerns  Post-Acute Placement   Other Concerns:    SOCIAL WORK ASSESSMENT / PLAN CSW completed assessment with pt's daughter at bedside, as pt is disoriented. Daughter [name] [name] states the ultimate goal is to get pt home with her. Daughter states if pt can get to a point where she can physically manage some of her care needs, daughter wants to set up home care. Pt has been in and out of the hospital for the past 5 months, and daughter expressed being overwhelmed. CSW provided emotional support and brief counseling. Pt transferring to [facility], and CSW will provide handoff to pt's new unit CSW.   Assessment/plan status:  Psychosocial Support/Ongoing Assessment of Needs Other assessment/ plan:   Information/referral to community resources:   [facility] SNF vs. home with daughter    PATIENT'S/FAMILY'S RESPONSE TO PLAN OF CARE: Good--pt's daughter understanding likely plan is back to [facility] following hospitalization. Daughter states the ultimate goal is to do everything medical staff can do for pt and to prolong life as long as possible. Daughter engaged in conversation with CSW and thanked CSW for support.        Ky Barban, MSW, [facility] Clinical Social Worker [phone]

## 2014-04-05 NOTE — Progress Notes (Signed)
OT Cancellation Note  Patient Details Name: [name] [name] MRN: [id number] DOB: May 15, 1930   Cancelled Treatment:    Reason Eval/Treat Not Completed: Patient not medically ready (currently EEG in progress/ transfering to ICU) RN Colletta [place] asking therapy to hold at this time. Pending transfer to neuro ICU  Peri Maris Pager: [phone]  04/05/2014, 10:57 AM

## 2014-04-05 NOTE — Progress Notes (Signed)
PT Cancellation Note  Patient Details Name: [name] [name] MRN: [id number] DOB: 06/26/1930   Cancelled Treatment:    Reason Eval/Treat Not Completed: Patient at procedure or test/unavailable. RN informed therapist that pt is about to leave unit for EEG. Will follow-up this afternoon as time permits.   Jolyn Lent 04/05/2014, 9:22 AM  Jolyn Lent, PT, DPT Acute Rehabilitation Services Pager: [phone]

## 2014-04-05 NOTE — Progress Notes (Signed)
Attempted to call report to [facility].  RN unavailable at the time.  Message left for RN to return call.

## 2014-04-05 NOTE — Progress Notes (Signed)
INFECTIOUS DISEASE PROGRESS NOTE  ID: [name] [name] is a 80 y.o. female with  Principal Problem:   Seizures Active Problems:   Osteomyelitis of thoracic spine   Acute encephalopathy   Pancreatitis, acute   Acute on chronic renal failure   Acute on chronic kidney failure  Subjective: Responds to voice, answers questions.   Abtx:  Anti-infectives   Start     Dose/Rate Route Frequency Ordered Stop   04/04/14 1000  ceFEPIme (MAXIPIME) 1 g in dextrose 5 % 50 mL IVPB  Status:  Discontinued     1 g 100 mL/hr over 30 Minutes Intravenous Every 24 hours 04/15/2014 1821 04/04/14 0931   04/04/14 1000  DAPTOmycin (CUBICIN) 530.5 mg in sodium chloride 0.9 % IVPB  Status:  Discontinued     6 mg/kg  88.4 kg 221.2 mL/hr over 30 Minutes Intravenous Every 24 hours 04/14/2014 1821 04/04/14 0138   03/31/2014 1230  ciprofloxacin (CIPRO) IVPB 400 mg     400 mg 200 mL/hr over 60 Minutes Intravenous  Once 04/02/2014 1225 03/21/2014 1423   04/09/2014 1200  cefTRIAXone (ROCEPHIN) 1 g in dextrose 5 % 50 mL IVPB  Status:  Discontinued     1 g 100 mL/hr over 30 Minutes Intravenous  Once 03/21/2014 1147 03/29/2014 1147   04/01/2014 1200  ceFEPIme (MAXIPIME) 2 g in dextrose 5 % 50 mL IVPB  Status:  Discontinued     2 g 100 mL/hr over 30 Minutes Intravenous Every 12 hours 04/06/2014 1148 04/02/2014 1225      Medications:  Scheduled: . cloNIDine  0.1 mg Transdermal Weekly  . furosemide  40 mg Intravenous Q12H  . heparin  5,000 Units Subcutaneous 3 times per day  . insulin aspart  0-9 Units Subcutaneous TID WC  . lidocaine  1 patch Transdermal Q24H  . potassium chloride  10 mEq Intravenous Q1 Hr x 4  . valproate sodium  2.5 g Intravenous STAT    Objective: Vital signs in last 24 hours: Temp:  [97.7 F (36.5 C)-98.6 F (37 C)] 98 F (36.7 C) (05/18 1308) Pulse Rate:  [73-101] 100 (05/18 0821) Resp:  [0-23] 16 (05/18 0821) BP: (152-219)/(64-137) 197/87 mmHg (05/18 0821) SpO2:  [99 %-100 %] 99 % (05/18  0821) Weight:  [88.6 kg (195 lb 5.2 oz)] 88.6 kg (195 lb 5.2 oz) (05/18 0405)   General appearance: alert, cooperative, mild distress and moderate distress Resp: clear to auscultation bilaterally Cardio: regular rate and rhythm GI: normal findings: bowel sounds normal and soft, non-tender  Lab Results  Recent Labs  03/24/2014 1034 04/07/2014 1105 04/04/14 0513 04/04/14 1753  WBC 9.3  --  9.7  --   HGB 9.8* 10.5* 9.6*  --   HCT 29.9* 31.0* 28.5*  --   NA  --  139 142 143  K  --  3.4* 3.1* 3.3*  CL  --  106 106 107  CO2  --   --  22 21  BUN  --  41* 45* 43*  CREATININE  --  4.80* 4.23* 4.25*   Liver Panel  Recent Labs  03/29/2014 1023 04/04/14 0513  PROT 7.0  --   ALBUMIN 2.4* 2.2*  AST 71*  --   ALT 29  --   ALKPHOS 64  --   BILITOT 0.3  --    Sedimentation Rate  Recent Labs  04/04/14 1230  ESRSEDRATE 103*   C-Reactive Protein  Recent Labs  04/04/14 1230  CRP 2.9*  Microbiology: Recent Results (from the past 240 hour(s))  URINE CULTURE     Status: None   Collection Time    04/08/2014 10:53 AM      Result Value Ref Range Status   Specimen Description URINE, RANDOM   Final   Special Requests NONE   Final   Culture  Setup Time     Final   Value: 04/14/2014 19:46     Performed at [facility]     Final   Value: 3,000 COLONIES/ML     Performed at [facility]   Culture     Final   Value: INSIGNIFICANT GROWTH     Performed at [facility]   Report Status 04/05/2014 FINAL   Final  CULTURE, BLOOD (ROUTINE X 2)     Status: None   Collection Time    03/30/2014 10:55 AM      Result Value Ref Range Status   Specimen Description BLOOD PICC LINE   Final   Special Requests BOTTLES DRAWN AEROBIC AND ANAEROBIC 10CC   Final   Culture  Setup Time     Final   Value: 03/24/2014 18:58     Performed at [facility]   Culture     Final   Value:        BLOOD CULTURE RECEIVED NO GROWTH TO DATE CULTURE WILL BE HELD FOR 5  DAYS BEFORE ISSUING A FINAL NEGATIVE REPORT     Performed at [facility]   Report Status PENDING   Incomplete  CULTURE, BLOOD (ROUTINE X 2)     Status: None   Collection Time    04/16/2014 11:00 AM      Result Value Ref Range Status   Specimen Description BLOOD RIGHT HAND   Final   Special Requests BOTTLES DRAWN AEROBIC ONLY 10CC   Final   Culture  Setup Time     Final   Value: 04/16/2014 18:58     Performed at [facility]   Culture     Final   Value:        BLOOD CULTURE RECEIVED NO GROWTH TO DATE CULTURE WILL BE HELD FOR 5 DAYS BEFORE ISSUING A FINAL NEGATIVE REPORT     Performed at [facility]   Report Status PENDING   Incomplete  MRSA PCR SCREENING     Status: None   Collection Time    04/04/14  5:32 PM      Result Value Ref Range Status   MRSA by PCR NEGATIVE  NEGATIVE Final   Comment:            The GeneXpert MRSA Assay (FDA     approved for NASAL specimens     only), is one component of a     comprehensive MRSA colonization     surveillance program. It is not     intended to diagnose MRSA     infection nor to guide or     monitor treatment for     MRSA infections.    Studies/Results: Ct Abdomen Pelvis Wo Contrast  03/24/2014   CLINICAL DATA:  Altered level of consciousness  EXAM: CT ABDOMEN AND PELVIS WITHOUT CONTRAST  TECHNIQUE: Multidetector CT imaging of the abdomen and pelvis was performed following the standard protocol without IV contrast.  COMPARISON:  None.  FINDINGS: Patchy bibasilar pulmonary opacities are compatible with early airspace disease or scattered subsegmental atelectasis.  Motion artifact severely degrades exam.  Postcholecystectomy.  Small calcified granulomata in the liver.  Spleen is within normal limits.  Adrenal glands are unremarkable.  The pancreas is ill-defined. There is stranding about the head of the pancreas some blurring of the fat planes. Inflammatory changes are not excluded. Trace free fluid in the right pericolic  gutter.  No hydronephrosis.  Simple cysts in the kidneys.  Bladder is distended.  Uterus and adnexa are within normal limits.  The destructive process at T10-T11 secondary to osteomyelitis and discitis is again noted. Spinal stenosis at this level is re- demonstrated.  IMPRESSION: Limited exam secondary to motion artifact  Findings suggest pancreatitis. Correlate with amylase and lipase levels.  Bibasilar atelectasis versus airspace disease.   Electronically Signed   By: Maryclare Bean M.D.   On: 04/09/2014 12:02   Ct Head Wo Contrast  04/12/2014   CLINICAL DATA:  Altered level of consciousness  EXAM: CT HEAD WITHOUT CONTRAST  TECHNIQUE: Contiguous axial images were obtained from the base of the skull through the vertex without intravenous contrast.  COMPARISON:  None.  FINDINGS: Global atrophy. Chronic ischemic changes in the periventricular white matter.  Motion artifact severely degrades the study despite multiple attempts at scanning.  Multiple calcifications are seen within the region of the tentorium. These are likely all extra-axial calcifications. This is difficult to confirm on axial imaging only.  No mass effect, midline shift, or acute intracranial hemorrhage. No obvious skull fracture. Go mucous material is present in the maxillary sinuses.  IMPRESSION: No acute intracranial pathology. Chronic ischemic changes. Motion artifact degrades the study   Electronically Signed   By: Maryclare Bean M.D.   On: 04/06/2014 11:57   Mr Brain Wo Contrast  04/04/2014   CLINICAL DATA:  Encephalopathy  EXAM: MRI HEAD WITHOUT CONTRAST  TECHNIQUE: Multiplanar, multiecho pulse sequences of the brain and surrounding structures were obtained without intravenous contrast.  COMPARISON:  CT 03/28/2014  FINDINGS: Image quality degraded by mild motion.  Generalized atrophy.  Negative for hydrocephalus.  Negative for acute infarct. Mild chronic microvascular ischemic change in the white matter.  Brainstem and cerebellum are normal.  Negative for hemorrhage or mass.  Mild mucosal edema in the paranasal sinuses.  IMPRESSION: No acute abnormality.   Electronically Signed   By: Franchot Gallo M.D.   On: 04/04/2014 12:08   Dg Chest Port 1 View  04/04/2014   CLINICAL DATA:  CHF  EXAM: PORTABLE CHEST - 1 VIEW  COMPARISON:  DG CHEST 1V PORT dated 04/18/2014; DG CHEST 1V PORT dated 12/16/2013  FINDINGS: Right upper extremity PICC line is present with tip projecting over the superior vena cava. Stable enlarged cardiac and mediastinal contours. Unchanged bilateral coarse interstitial pulmonary opacities. No pleural effusion or pneumothorax. Extensive bilateral glenohumeral joint degenerative change.  IMPRESSION: Cardiomegaly. Bilateral coarse interstitial opacities suggestive of pulmonary edema.   Electronically Signed   By: Lovey Newcomer M.D.   On: 04/04/2014 19:12     Assessment/Plan: Discitis/Osteo  Aspirate January (-) on anbx, vanco/ceftriaxone  Repeat MRI 4-24- worsening of osteo/discitis T10-11. Started on    Dapto/cefepime 5-4 Encephalopathy, seizures ARF Pancreatitis (lipase 1219)  Total days of antibiotics: off  Will continue to hold her anbx while her seizures are controlled.  Being transferred to ICU for airway mgmt.           Campbell Riches Infectious Diseases (pager) [phone] [email or web address] 04/05/2014, 11:41 AM  LOS: 2 days   **Disclaimer: This note may have been dictated with voice recognition software. Similar  sounding words can inadvertently be transcribed and this note may contain transcription errors which may not have been corrected upon publication of note.**

## 2014-04-05 NOTE — Progress Notes (Signed)
Attending physician's note   I have taken an interval history, reviewed the chart and examined the patient. I agree with the Advanced Practitioner's note, impression and recommendations.   Pricilla Riffle. Fuller Plan, MD [facility]

## 2014-04-05 NOTE — Progress Notes (Addendum)
Subjective: eeg reveals NCSE  Exam: Filed Vitals:   04/05/14 0821  BP: 197/87  Pulse: 100  Temp: 98 F (36.7 C)  Resp: 16   Gen: In bed, NAD MS: opens eyes to voice. Orients head towards examiner.  TZ:GYFVC, Appears tofixate briefly, but not clear. Does not track.  Motor: moves all extremities. Scratches head with left arm. Today there are some sterotypical rhythmic movements of the left arm.  Sensory:responds to noxious stimuli bilaterally.   Impression: Altered mental status with NCSE on EEG in the setting of cefepime. She has purposefull movements bilaterally and will squeeze hands, seemingly to commands at times, but not reliably. There are no focal findings on my exam. She is afebrile and neck is supple. In the setting of her cefepime dose, recent AKI, and presentation, I strongly suspect cefepime neurotoxicity as a presenting etiology. This can and does cause NCSE.    Recommendations: 1) Ativan 2mg  , will repat as needed.  2) Load with depakote 2.5 gm 3) further therapy based on response to the above.    This patient is critically ill and at significant risk of neurological worsening, death and care requires constant monitoring of vital signs, hemodynamics,respiratory and cardiac monitoring, neurological assessment, discussion with family, other specialists and medical decision making of high complexity. I personally attended to the patient with frequent EEG monitoring and medication titration. I spent 45 minutes of neurocritical care time  in the care of  this patient.  Roland Rack, MD [facility] [phone]  If 7pm- 7am, please page neurology on call as listed in [place]. 04/05/2014  12:13 PM

## 2014-04-05 NOTE — Progress Notes (Signed)
Subjective:  Remains hypertensive- did make 250 of urine which is new.  Creatinine trended down from 2 days ago to yesterday, but no labs today. Neuro, ID and GI all involved.  Now has been diagnosed with status thought maybe related to cefepime- being transferred to the neuro ICU Objective Vital signs in last 24 hours: Filed Vitals:   04/04/14 2356 04/05/14 0100 04/05/14 0405 04/05/14 0821  BP: 181/71 152/102 162/64 197/87  Pulse: 100 88 88 100  Temp: 97.9 F (36.6 C)  98 F (36.7 C) 98 F (36.7 C)  TempSrc: Axillary  Axillary Axillary  Resp: 15 0 17 16  Height:      Weight:   88.6 kg (195 lb 5.2 oz)   SpO2: 100% 100% 100% 99%   Weight change: 0.239 kg (8.4 oz)  Intake/Output Summary (Last 24 hours) at 04/05/14 1110 Last data filed at 04/05/14 0403  Gross per 24 hour  Intake      0 ml  Output    250 ml  Net   -250 ml    Assessment/ Plan: Pt is a 80 y.o. yo female who was admitted on 04/08/2014 with confusion. She has A on CKD in the setting of being on abx for osteo but also possibly some volume depletion with hypercalcemia  Assessment/Plan: 1. A on CKD- is making urine- no chemistries done today, I will order.  No indications for dialysis to date.  i am not sure would be candidate for long term dialysis given everything that is going on with her right now.  Heavy discussions would need to be held with family before ai would consider short term dialysis either 2. HTN/vol- status is difficult to determine because originally thought was dry but hypertensive- I agree that I would give IVF- may need lasicx also in order to treat hypercalcemia  3. Anemia- reasonable for situation, no support needed yet but suspect will go down with hydration 4. ID- hx of osteo- now not on anything 5. Pancreatis - supportive care 6. Status epilepticus- per neuro- moving to neuro ICU to sedate- 7. Dispo- many severer issues for an 80 year old female- not sure prognosis is good.   Louis Meckel    Labs: Basic Metabolic Panel:  Recent Labs Lab 04/02/2014 1023 04/01/2014 1105 04/08/2014 1855 04/04/14 0513 04/04/14 1753  NA 142 139  --  142 143  K 3.6* 3.4*  --  3.1* 3.3*  CL 103 106  --  106 107  CO2 21  --   --  22 21  GLUCOSE 93 93  --  95 101*  BUN 46* 41*  --  45* 43*  CREATININE 4.29* 4.80*  --  4.23* 4.25*  CALCIUM 11.6*  --   --  11.0* 10.8*  PHOS  --   --  3.0 3.0  --    Liver Function Tests:  Recent Labs Lab 03/28/2014 1023 04/04/14 0513  AST 71*  --   ALT 29  --   ALKPHOS 64  --   BILITOT 0.3  --   PROT 7.0  --   ALBUMIN 2.4* 2.2*    Recent Labs Lab 04/13/2014 1023  LIPASE 1219*    Recent Labs Lab 04/02/2014 1855  AMMONIA 21   CBC:  Recent Labs Lab 03/25/2014 1034 03/26/2014 1105 04/04/14 0513  WBC 9.3  --  9.7  NEUTROABS 5.9  --  6.1  HGB 9.8* 10.5* 9.6*  HCT 29.9* 31.0* 28.5*  MCV 84.7  --  83.8  PLT 203  --  182   Cardiac Enzymes:  Recent Labs Lab 04/14/2014 1855 04/04/14 0513 04/04/14 1230  CKTOTAL 584* 621*  --   TROPONINI  --  <0.30 <0.30   CBG:  Recent Labs Lab 04/04/14 0611 04/04/14 1243 04/04/14 1600 04/04/14 1750 04/05/14 0824  GLUCAP 102* 83 100* 99 74    Iron Studies:  Recent Labs  04/04/14 0513  IRON 33*  TIBC 135*  FERRITIN 253   Studies/Results: Ct Abdomen Pelvis Wo Contrast  04/02/2014   CLINICAL DATA:  Altered level of consciousness  EXAM: CT ABDOMEN AND PELVIS WITHOUT CONTRAST  TECHNIQUE: Multidetector CT imaging of the abdomen and pelvis was performed following the standard protocol without IV contrast.  COMPARISON:  None.  FINDINGS: Patchy bibasilar pulmonary opacities are compatible with early airspace disease or scattered subsegmental atelectasis.  Motion artifact severely degrades exam.  Postcholecystectomy.  Small calcified granulomata in the liver.  Spleen is within normal limits.  Adrenal glands are unremarkable.  The pancreas is ill-defined. There is stranding about the head of the  pancreas some blurring of the fat planes. Inflammatory changes are not excluded. Trace free fluid in the right pericolic gutter.  No hydronephrosis.  Simple cysts in the kidneys.  Bladder is distended.  Uterus and adnexa are within normal limits.  The destructive process at T10-T11 secondary to osteomyelitis and discitis is again noted. Spinal stenosis at this level is re- demonstrated.  IMPRESSION: Limited exam secondary to motion artifact  Findings suggest pancreatitis. Correlate with amylase and lipase levels.  Bibasilar atelectasis versus airspace disease.   Electronically Signed   By: Maryclare Bean M.D.   On: 03/20/2014 12:02   Ct Head Wo Contrast  04/17/2014   CLINICAL DATA:  Altered level of consciousness  EXAM: CT HEAD WITHOUT CONTRAST  TECHNIQUE: Contiguous axial images were obtained from the base of the skull through the vertex without intravenous contrast.  COMPARISON:  None.  FINDINGS: Global atrophy. Chronic ischemic changes in the periventricular white matter.  Motion artifact severely degrades the study despite multiple attempts at scanning.  Multiple calcifications are seen within the region of the tentorium. These are likely all extra-axial calcifications. This is difficult to confirm on axial imaging only.  No mass effect, midline shift, or acute intracranial hemorrhage. No obvious skull fracture. Go mucous material is present in the maxillary sinuses.  IMPRESSION: No acute intracranial pathology. Chronic ischemic changes. Motion artifact degrades the study   Electronically Signed   By: Maryclare Bean M.D.   On: 04/12/2014 11:57   Mr Brain Wo Contrast  04/04/2014   CLINICAL DATA:  Encephalopathy  EXAM: MRI HEAD WITHOUT CONTRAST  TECHNIQUE: Multiplanar, multiecho pulse sequences of the brain and surrounding structures were obtained without intravenous contrast.  COMPARISON:  CT 04/07/2014  FINDINGS: Image quality degraded by mild motion.  Generalized atrophy.  Negative for hydrocephalus.  Negative for  acute infarct. Mild chronic microvascular ischemic change in the white matter.  Brainstem and cerebellum are normal. Negative for hemorrhage or mass.  Mild mucosal edema in the paranasal sinuses.  IMPRESSION: No acute abnormality.   Electronically Signed   By: Franchot Gallo M.D.   On: 04/04/2014 12:08   Dg Chest Port 1 View  04/04/2014   CLINICAL DATA:  CHF  EXAM: PORTABLE CHEST - 1 VIEW  COMPARISON:  DG CHEST 1V PORT dated 03/27/2014; DG CHEST 1V PORT dated 12/16/2013  FINDINGS: Right upper extremity PICC line is present with tip projecting over  the superior vena cava. Stable enlarged cardiac and mediastinal contours. Unchanged bilateral coarse interstitial pulmonary opacities. No pleural effusion or pneumothorax. Extensive bilateral glenohumeral joint degenerative change.  IMPRESSION: Cardiomegaly. Bilateral coarse interstitial opacities suggestive of pulmonary edema.   Electronically Signed   By: Lovey Newcomer M.D.   On: 04/04/2014 19:12   Dg Chest Port 1 View  26-Apr-2014   CLINICAL DATA:  Altered mental status  EXAM: PORTABLE CHEST - 1 VIEW  COMPARISON:  Chest radiograph December 27, 2013 and chest CT January 19, 2014  FINDINGS: There is a central catheter with tip in the superior vena cava near the cavoatrial junction. No pneumothorax. There is mild interstitial edema. Heart is borderline enlarged with normal pulmonary vascularity. No airspace consolidation.  IMPRESSION: Evidence of a degree of congestive heart failure. No consolidation. No pneumothorax.   Electronically Signed   By: Lowella Grip M.D.   On: 04/26/2014 11:21   Medications: Infusions:    Scheduled Medications: . alteplase  2 mg Intracatheter Once  . alteplase  2 mg Intracatheter Once  . cloNIDine  0.1 mg Transdermal Weekly  . heparin  5,000 Units Subcutaneous 3 times per day  . insulin aspart  0-9 Units Subcutaneous TID WC  . lidocaine  1 patch Transdermal Q24H  . LORazepam      . valproate sodium  2.5 g Intravenous STAT     have reviewed scheduled and prn medications.  Physical Exam: General: moaning - no purposeful movements Heart: tachy Lungs: difficult to examine- lying flat with good O2 sat Abdomen: obese, reacts to palpation Extremities: trace edema    04/05/2014,11:10 AM  LOS: 2 days

## 2014-04-06 DIAGNOSIS — E43 Unspecified severe protein-calorie malnutrition: Secondary | ICD-10-CM | POA: Diagnosis present

## 2014-04-06 DIAGNOSIS — G40309 Generalized idiopathic epilepsy and epileptic syndromes, not intractable, without status epilepticus: Secondary | ICD-10-CM

## 2014-04-06 LAB — PROTEIN ELECTROPHORESIS, SERUM
ALPHA-1-GLOBULIN: 4.6 % (ref 2.9–4.9)
ALPHA-2-GLOBULIN: 12.9 % — AB (ref 7.1–11.8)
Albumin ELP: 52.9 % — ABNORMAL LOW (ref 55.8–66.1)
BETA GLOBULIN: 5.9 % (ref 4.7–7.2)
Beta 2: 9.4 % — ABNORMAL HIGH (ref 3.2–6.5)
Gamma Globulin: 14.3 % (ref 11.1–18.8)
M-Spike, %: NOT DETECTED g/dL
Total Protein ELP: 6.4 g/dL (ref 6.0–8.3)

## 2014-04-06 LAB — BASIC METABOLIC PANEL
BUN: 48 mg/dL — ABNORMAL HIGH (ref 6–23)
BUN: 49 mg/dL — ABNORMAL HIGH (ref 6–23)
CALCIUM: 10.9 mg/dL — AB (ref 8.4–10.5)
CO2: 21 meq/L (ref 19–32)
CO2: 22 meq/L (ref 19–32)
Calcium: 10.8 mg/dL — ABNORMAL HIGH (ref 8.4–10.5)
Chloride: 111 mEq/L (ref 96–112)
Chloride: 109 mEq/L (ref 96–112)
Creatinine, Ser: 4.79 mg/dL — ABNORMAL HIGH (ref 0.50–1.10)
Creatinine, Ser: 4.79 mg/dL — ABNORMAL HIGH (ref 0.50–1.10)
GFR calc Af Amer: 9 mL/min — ABNORMAL LOW (ref >90)
GFR calc Af Amer: 9 mL/min — ABNORMAL LOW (ref >90)
GFR calc non Af Amer: 8 mL/min — ABNORMAL LOW (ref >90)
GFR, EST NON AFRICAN AMERICAN: 8 mL/min — AB (ref >90)
GLUCOSE: 77 mg/dL (ref 70–99)
GLUCOSE: 93 mg/dL (ref 70–99)
POTASSIUM: 3.7 meq/L (ref 3.7–5.3)
Potassium: 3.6 mEq/L — ABNORMAL LOW (ref 3.7–5.3)
SODIUM: 146 meq/L (ref 137–147)
Sodium: 146 mEq/L (ref 137–147)

## 2014-04-06 LAB — GLUCOSE, CAPILLARY
GLUCOSE-CAPILLARY: 65 mg/dL — AB (ref 70–99)
GLUCOSE-CAPILLARY: 79 mg/dL (ref 70–99)
Glucose-Capillary: 73 mg/dL (ref 70–99)
Glucose-Capillary: 79 mg/dL (ref 70–99)
Glucose-Capillary: 75 mg/dL (ref 70–99)
Glucose-Capillary: 84 mg/dL (ref 70–99)

## 2014-04-06 LAB — AMYLASE: Amylase: 113 U/L — ABNORMAL HIGH (ref 0–105)

## 2014-04-06 LAB — LIPASE, BLOOD: LIPASE: 174 U/L — AB (ref 11–59)

## 2014-04-06 MED ORDER — HYDRALAZINE HCL 20 MG/ML IJ SOLN
10.0000 mg | INTRAMUSCULAR | Status: DC | PRN
  Administered 2014-04-06 – 2014-04-07 (×3): 20 mg via INTRAVENOUS
  Administered 2014-04-07: 10 mg via INTRAVENOUS
  Administered 2014-04-08 – 2014-04-09 (×2): 20 mg via INTRAVENOUS
  Administered 2014-04-09: 10 mg via INTRAVENOUS
  Administered 2014-04-13: 20 mg via INTRAVENOUS
  Filled 2014-04-06 (×5): qty 1
  Filled 2014-04-06: qty 2
  Filled 2014-04-06: qty 1

## 2014-04-06 MED ORDER — DEXTROSE-NACL 5-0.45 % IV SOLN
INTRAVENOUS | Status: DC
  Administered 2014-04-06 – 2014-04-08 (×3): via INTRAVENOUS
  Administered 2014-04-09: 1 mL via INTRAVENOUS
  Administered 2014-04-09: 16:00:00 via INTRAVENOUS

## 2014-04-06 MED ORDER — DARBEPOETIN ALFA-POLYSORBATE 100 MCG/0.5ML IJ SOLN
100.0000 ug | INTRAMUSCULAR | Status: DC
  Administered 2014-04-06 – 2014-04-13 (×2): 100 ug via SUBCUTANEOUS
  Filled 2014-04-06 (×4): qty 0.5

## 2014-04-06 NOTE — Progress Notes (Addendum)
Daily Rounding Note  04/06/2014, 9:11 AM  LOS: 3 days   SUBJECTIVE:       Getting BID Lasix, still no IVF. 860 cc of urine output 5/18. IVF currently at 75 cc/hour.  A little more alert this AM.  Said "what" to dtr today.   OBJECTIVE:         Vital signs in last 24 hours:    Temp:  [97.4 F (36.3 C)-98.5 F (36.9 C)] 98.3 F (36.8 C) (05/19 0336) Pulse Rate:  [83-132] 99 (05/19 0815) Resp:  [8-28] 12 (05/19 0815) BP: (118-207)/(44-85) 164/63 mmHg (05/19 0815) SpO2:  [93 %-100 %] 99 % (05/19 0815) Weight:  [93.7 kg (206 lb 9.1 oz)] 93.7 kg (206 lb 9.1 oz) (05/19 0336) Last BM Date:  (Unsure) General: still obtunded but calmer.   Heart: RRR Chest: clear bil Abdomen: soft, active BS, NT, ND  Extremities: no pedal edema Neuro/Psych:  Not displaying the rhythmic limb movements evident yesterday.  Some side to side movement of her head  Intake/Output from previous day: 05/18 0701 - 05/19 0700 In: 1130 [I.V.:585; IV Piggyback:545] Out: 2297 [LGXQJ:1941]  Intake/Output this shift: Total I/O In: 200 [I.V.:100; IV Piggyback:100] Out: -   Lab Results:  Recent Labs  04/09/2014 1034 03/24/2014 1105 04/04/14 0513 04/05/14 0800  WBC 9.3  --  9.7 10.8*  HGB 9.8* 10.5* 9.6* 8.9*  HCT 29.9* 31.0* 28.5* 26.5*  PLT 203  --  182 149*   BMET  Recent Labs  04/05/14 0800 04/05/14 1800 04/06/14 0754  NA 145 147 146  K 3.3* 4.2 3.7  CL 107 113* 111  CO2 19 21 21   GLUCOSE 107* 87 77  BUN 45* 47* 48*  CREATININE 4.46* 4.52* 4.79*  CALCIUM 10.7* 10.7* 10.8*   LFT  Recent Labs  04/14/2014 1023 04/04/14 0513  PROT 7.0  --   ALBUMIN 2.4* 2.2*  AST 71*  --   ALT 29  --   ALKPHOS 64  --   BILITOT 0.3  --    Lipase     Component Value Date/Time   LIPASE 174* 04/06/2014 0754    PT/INR  Recent Labs  03/26/2014 1855  LABPROT 16.2*  INR 1.33   Hepatitis Panel No results found for this basename: HEPBSAG,  HCVAB, HEPAIGM, HEPBIGM,  in the last 72 hours  Studies/Results: Mr Brain Wo Contrast  04/04/2014   CLINICAL DATA:  Encephalopathy  EXAM: MRI HEAD WITHOUT CONTRAST  TECHNIQUE: Multiplanar, multiecho pulse sequences of the brain and surrounding structures were obtained without intravenous contrast.  COMPARISON:  CT 03/26/2014  FINDINGS: Image quality degraded by mild motion.  Generalized atrophy.  Negative for hydrocephalus.  Negative for acute infarct. Mild chronic microvascular ischemic change in the white matter.  Brainstem and cerebellum are normal. Negative for hemorrhage or mass.  Mild mucosal edema in the paranasal sinuses.  IMPRESSION: No acute abnormality.   Electronically Signed   By: Franchot Gallo M.D.   On: 04/04/2014 12:08   Dg Chest Port 1 View  04/04/2014   CLINICAL DATA:  CHF  EXAM: PORTABLE CHEST - 1 VIEW  COMPARISON:  DG CHEST 1V PORT dated 03/23/2014; DG CHEST 1V PORT dated 12/16/2013  FINDINGS: Right upper extremity PICC line is present with tip projecting over the superior vena cava. Stable enlarged cardiac and mediastinal contours. Unchanged bilateral coarse interstitial pulmonary opacities. No pleural effusion or pneumothorax. Extensive bilateral glenohumeral joint degenerative change.  IMPRESSION:  Cardiomegaly. Bilateral coarse interstitial opacities suggestive of pulmonary edema.   Electronically Signed   By: Lovey Newcomer M.D.   On: 04/04/2014 19:12    ASSESMENT:   * Acute pancreatitis, non-biliary. Not severe by CT scan. Lipase 174, down from 1219. ? due to hypercalcemia.  GB removed remotely. Ducts not dilated.  * Encephalopathy. In status epilecticus. Working dx is Cefepime toxicity.  * Hypercalcemia.  * UTI. Insignificant growth on clx of 5/16. Maxipime discontinued 5/17  * Osteomyelitis, vertebral. Currently no abx, stopped due to severely reduced GFR/ARF. On Cefepime at admission  * ARF.      PLAN   *  Would place panda/kangaroo feeding tube and start liquid  nutrition if anticipate it will be more than 2 days before she can reliably take po on her own.    Vena Rua  04/06/2014, 9:11 AM Pager: [phone]    Attending physician's note   I have taken an interval history, reviewed the chart and examined the patient. I agree with the Advanced Practitioner's note, impression and recommendations. Acute pancreatitis is nicely resolving. Lipase is now 174. Suspected etiology is hypercalcemia which has also improved.  Early and aggressive IV hydration in acute pancreatitis is felt to decrease morbidity and mortality by preventing intravascular volume depletion and possible helping to prevent pancreatitis necrosis. Maintaining normal intravascular volumes is the goal in acute pancreatitis.  Enteral feeding for nutritional support is recommended if she will not be able to adequately feed orally in next 1-2 days, defer decision to primary service. GI signing off. Outpatient GI follow up with Dr. Oretha Caprice if needed.   Pricilla Riffle. Fuller Plan, MD [facility]

## 2014-04-06 NOTE — Progress Notes (Signed)
OT Cancellation Note  Patient Details Name: [name] [name] MRN: [id number] DOB: 1930/07/28   Cancelled Treatment:    Reason Eval/Treat Not Completed: Medical issues which prohibited therapy - pt undergoing LTM EEG.  [place], OTR/L [phone] 04/06/2014, 9:25 AM

## 2014-04-06 NOTE — Progress Notes (Signed)
LTM Day 2 is running, all connections are good.

## 2014-04-06 NOTE — Progress Notes (Signed)
[facility] Progress Note Patient Name: [name] [name] DOB: 07/23/1930 MRN: [id number]  Date of Service  04/06/2014   HPI/Events of Note   Hypertension despite prn labetalol  eICU Interventions  Prn hydralazine added   Intervention Category Major Interventions: Hypertension - evaluation and management  Juanito Doom 04/06/2014, 6:55 PM

## 2014-04-06 NOTE — Progress Notes (Signed)
Subjective:  Moved to neuro ICU- blood pressure seemed better but is back up some this AM- did make 1500 of urine last 24 hours with some help of lasix.  Creatinine trended up slightly the last few checks. Neuro, ID and GI all involved.  Now has been diagnosed with status thought maybe related to cefepime- on depakote/ativan Objective Vital signs in last 24 hours: Filed Vitals:   04/06/14 0615 04/06/14 0700 04/06/14 0800 04/06/14 0815  BP: 160/65 178/71 192/72 164/63  Pulse: 104 101 103 99  Temp:      TempSrc:      Resp: 10 13 14 12   Height:      Weight:      SpO2: 100% 100% 100% 99%   Weight change: 5.1 kg (11 lb 3.9 oz)  Intake/Output Summary (Last 24 hours) at 04/06/14 0941 Last data filed at 04/06/14 0800  Gross per 24 hour  Intake   1330 ml  Output   1535 ml  Net   -205 ml    Assessment/ Plan: Pt is a 80 y.o. yo female who was admitted on 04/08/2014 with confusion. She has A on CKD in the setting of being on abx for osteo but also possibly some volume depletion with hypercalcemia  Assessment/Plan: 1. A on CKD- baseline creatinine is in the mid to high ones earlier this year... is making urine-.  No indications for dialysis to date.  i am not sure would be candidate for long term dialysis given everything that is going on with her right now.  Heavy discussions would need to be held with family before I would consider short term dialysis either 2. HTN/vol- status is difficult to determine because originally thought was dry but hypertensive as well- I agree that I would give IVF- changed to d5 1/2 for sodium which is a little high-  Was giving lasix also in order to treat hypercalcemia - will hold right now and see what her UOP will do 3. Anemia- reasonable for situation, decreasing with hydration- will add aranesp and watch for transfusion need 4. ID- hx of osteo- now not on anything 5. Pancreatis - supportive care- numbers seem to be improving 6. Status epilepticus- per neuro-  moving to neuro ICU to sedate-keppra/depakote/ativan 7. Elytes- hydration for hypercalcemia- previous replacement for hypokalemia 8. Dispo- many severe issues for an 80 year old female- not sure prognosis is good.   Louis Meckel    Labs: Basic Metabolic Panel:  Recent Labs Lab 04/01/2014 1105 03/19/2014 1855 04/04/14 0513  04/05/14 0800 04/05/14 1800 04/06/14 0754  NA 139  --  142  < > 145 147 146  K 3.4*  --  3.1*  < > 3.3* 4.2 3.7  CL 106  --  106  < > 107 113* 111  CO2  --   --  22  < > 19 21 21   GLUCOSE 93  --  95  < > 107* 87 77  BUN 41*  --  45*  < > 45* 47* 48*  CREATININE 4.80*  --  4.23*  < > 4.46* 4.52* 4.79*  CALCIUM  --  11.1* 11.0*  < > 10.7* 10.7* 10.8*  PHOS  --  3.0 3.0  --   --   --   --   < > = values in this interval not displayed. Liver Function Tests:  Recent Labs Lab 03/21/2014 1023 04/04/14 0513  AST 71*  --   ALT 29  --   ALKPHOS  64  --   BILITOT 0.3  --   PROT 7.0  --   ALBUMIN 2.4* 2.2*    Recent Labs Lab Apr 27, 2014 1023 04/05/14 0800 04/06/14 0754  LIPASE 1219* 247* 174*  AMYLASE  --   --  113*    Recent Labs Lab 04/27/14 1855  AMMONIA 21   CBC:  Recent Labs Lab 04/27/14 1034 27-Apr-2014 1105 04/04/14 0513 04/05/14 0800  WBC 9.3  --  9.7 10.8*  NEUTROABS 5.9  --  6.1 5.7  HGB 9.8* 10.5* 9.6* 8.9*  HCT 29.9* 31.0* 28.5* 26.5*  MCV 84.7  --  83.8 84.9  PLT 203  --  182 149*   Cardiac Enzymes:  Recent Labs Lab April 27, 2014 1855 04/04/14 0513 04/04/14 1230 04/05/14 0800  CKTOTAL 584* 621*  --  701*  TROPONINI  --  <0.30 <0.30  --    CBG:  Recent Labs Lab 04/05/14 1611 04/05/14 2008 04/06/14 0002 04/06/14 0335 04/06/14 0750  GLUCAP 64* 79 79 73 79    Iron Studies:   Recent Labs  04/04/14 0513  IRON 33*  TIBC 135*  FERRITIN 253   Studies/Results: Mr Brain Wo Contrast  04/04/2014   CLINICAL DATA:  Encephalopathy  EXAM: MRI HEAD WITHOUT CONTRAST  TECHNIQUE: Multiplanar, multiecho pulse sequences of  the brain and surrounding structures were obtained without intravenous contrast.  COMPARISON:  CT 04-27-14  FINDINGS: Image quality degraded by mild motion.  Generalized atrophy.  Negative for hydrocephalus.  Negative for acute infarct. Mild chronic microvascular ischemic change in the white matter.  Brainstem and cerebellum are normal. Negative for hemorrhage or mass.  Mild mucosal edema in the paranasal sinuses.  IMPRESSION: No acute abnormality.   Electronically Signed   By: Franchot Gallo M.D.   On: 04/04/2014 12:08   Dg Chest Port 1 View  04/04/2014   CLINICAL DATA:  CHF  EXAM: PORTABLE CHEST - 1 VIEW  COMPARISON:  DG CHEST 1V PORT dated 2014-04-27; DG CHEST 1V PORT dated 12/16/2013  FINDINGS: Right upper extremity PICC line is present with tip projecting over the superior vena cava. Stable enlarged cardiac and mediastinal contours. Unchanged bilateral coarse interstitial pulmonary opacities. No pleural effusion or pneumothorax. Extensive bilateral glenohumeral joint degenerative change.  IMPRESSION: Cardiomegaly. Bilateral coarse interstitial opacities suggestive of pulmonary edema.   Electronically Signed   By: Lovey Newcomer M.D.   On: 04/04/2014 19:12   Medications: Infusions: . dextrose 5 % and 0.45% NaCl      Scheduled Medications: . antiseptic oral rinse  15 mL Mouth Rinse q12n4p  . chlorhexidine  15 mL Mouth Rinse BID  . cloNIDine  0.1 mg Transdermal Weekly  . furosemide  40 mg Intravenous Q12H  . heparin  5,000 Units Subcutaneous 3 times per day  . insulin aspart  0-9 Units Subcutaneous Q4H  . levETIRAcetam  1,000 mg Intravenous Q12H  . lidocaine  1 patch Transdermal Q24H  . valproate sodium  500 mg Intravenous 3 times per day    have reviewed scheduled and prn medications.  Physical Exam: General: moaning - no purposeful movements- daughter felt like more responsive but I am not seeing Heart: tachy Lungs: difficult to examine- lying flat with good O2 sat Abdomen: obese, reacts  to palpation Extremities: trace edema    04/06/2014,9:41 AM  LOS: 3 days

## 2014-04-06 NOTE — Progress Notes (Signed)
INFECTIOUS DISEASE PROGRESS NOTE  ID: [name] [name] is a 80 y.o. female with  Principal Problem:   Seizures Active Problems:   Osteomyelitis of thoracic spine   Acute encephalopathy   Pancreatitis, acute   Acute on chronic renal failure   Acute on chronic kidney failure   Non-convulsive status epilepticus   Protein-calorie malnutrition, severe  Subjective: resting  Abtx:  Anti-infectives   Start     Dose/Rate Route Frequency Ordered Stop   04/04/14 1000  ceFEPIme (MAXIPIME) 1 g in dextrose 5 % 50 mL IVPB  Status:  Discontinued     1 g 100 mL/hr over 30 Minutes Intravenous Every 24 hours 03/27/2014 1821 04/04/14 0931   04/04/14 1000  DAPTOmycin (CUBICIN) 530.5 mg in sodium chloride 0.9 % IVPB  Status:  Discontinued     6 mg/kg  88.4 kg 221.2 mL/hr over 30 Minutes Intravenous Every 24 hours 03/30/2014 1821 04/04/14 0138   04/06/2014 1230  ciprofloxacin (CIPRO) IVPB 400 mg     400 mg 200 mL/hr over 60 Minutes Intravenous  Once 04/16/2014 1225 03/21/2014 1423   04/16/2014 1200  cefTRIAXone (ROCEPHIN) 1 g in dextrose 5 % 50 mL IVPB  Status:  Discontinued     1 g 100 mL/hr over 30 Minutes Intravenous  Once 04/04/2014 1147 03/24/2014 1147   04/01/2014 1200  ceFEPIme (MAXIPIME) 2 g in dextrose 5 % 50 mL IVPB  Status:  Discontinued     2 g 100 mL/hr over 30 Minutes Intravenous Every 12 hours 04/08/2014 1148 04/04/2014 1225      Medications:  Scheduled: . antiseptic oral rinse  15 mL Mouth Rinse q12n4p  . chlorhexidine  15 mL Mouth Rinse BID  . cloNIDine  0.1 mg Transdermal Weekly  . darbepoetin (ARANESP) injection - NON-DIALYSIS  100 mcg Subcutaneous Q Tue-1800  . heparin  5,000 Units Subcutaneous 3 times per day  . insulin aspart  0-9 Units Subcutaneous Q4H  . levETIRAcetam  1,000 mg Intravenous Q12H  . lidocaine  1 patch Transdermal Q24H  . valproate sodium  500 mg Intravenous 3 times per day    Objective: Vital signs in last 24 hours: Temp:  [97.4 F (36.3 C)-98.5 F (36.9 C)] 98.3 F  (36.8 C) (05/19 0336) Pulse Rate:  [83-132] 99 (05/19 0815) Resp:  [8-28] 12 (05/19 0815) BP: (118-207)/(44-85) 164/63 mmHg (05/19 0815) SpO2:  [93 %-100 %] 99 % (05/19 0815) Weight:  [93.7 kg (206 lb 9.1 oz)] 93.7 kg (206 lb 9.1 oz) (05/19 0336)   General appearance: no distress Resp: clear to auscultation bilaterally Cardio: regular rate and rhythm GI: normal findings: bowel sounds normal and soft, non-tender  Lab Results  Recent Labs  04/04/14 0513  04/05/14 0800 04/05/14 1800 04/06/14 0754  WBC 9.7  --  10.8*  --   --   HGB 9.6*  --  8.9*  --   --   HCT 28.5*  --  26.5*  --   --   NA 142  < > 145 147 146  K 3.1*  < > 3.3* 4.2 3.7  CL 106  < > 107 113* 111  CO2 22  < > 19 21 21   BUN 45*  < > 45* 47* 48*  CREATININE 4.23*  < > 4.46* 4.52* 4.79*  < > = values in this interval not displayed. Liver Panel  Recent Labs  04/01/2014 1023 04/04/14 0513  PROT 7.0  --   ALBUMIN 2.4* 2.2*  AST 71*  --  ALT 29  --   ALKPHOS 64  --   BILITOT 0.3  --    Sedimentation Rate  Recent Labs  04/04/14 1230  ESRSEDRATE 103*   C-Reactive Protein  Recent Labs  04/04/14 1230  CRP 2.9*    Microbiology: Recent Results (from the past 240 hour(s))  URINE CULTURE     Status: None   Collection Time    04/17/2014 10:53 AM      Result Value Ref Range Status   Specimen Description URINE, RANDOM   Final   Special Requests NONE   Final   Culture  Setup Time     Final   Value: 04/17/2014 19:46     Performed at [place]     Final   Value: 3,000 COLONIES/ML     Performed at [facility]   Culture     Final   Value: INSIGNIFICANT GROWTH     Performed at [facility]   Report Status 04/05/2014 FINAL   Final  CULTURE, BLOOD (ROUTINE X 2)     Status: None   Collection Time    04/16/2014 10:55 AM      Result Value Ref Range Status   Specimen Description BLOOD PICC LINE   Final   Special Requests BOTTLES DRAWN AEROBIC AND ANAEROBIC 10CC    Final   Culture  Setup Time     Final   Value: 04/17/2014 18:58     Performed at [facility]   Culture     Final   Value:        BLOOD CULTURE RECEIVED NO GROWTH TO DATE CULTURE WILL BE HELD FOR 5 DAYS BEFORE ISSUING A FINAL NEGATIVE REPORT     Performed at [facility]   Report Status PENDING   Incomplete  CULTURE, BLOOD (ROUTINE X 2)     Status: None   Collection Time    04/18/2014 11:00 AM      Result Value Ref Range Status   Specimen Description BLOOD RIGHT HAND   Final   Special Requests BOTTLES DRAWN AEROBIC ONLY 10CC   Final   Culture  Setup Time     Final   Value: 04/10/2014 18:58     Performed at [facility]   Culture     Final   Value:        BLOOD CULTURE RECEIVED NO GROWTH TO DATE CULTURE WILL BE HELD FOR 5 DAYS BEFORE ISSUING A FINAL NEGATIVE REPORT     Performed at [facility]   Report Status PENDING   Incomplete  MRSA PCR SCREENING     Status: None   Collection Time    04/04/14  5:32 PM      Result Value Ref Range Status   MRSA by PCR NEGATIVE  NEGATIVE Final   Comment:            The GeneXpert MRSA Assay (FDA     approved for NASAL specimens     only), is one component of a     comprehensive MRSA colonization     surveillance program. It is not     intended to diagnose MRSA     infection nor to guide or     monitor treatment for     MRSA infections.    Studies/Results: Mr Brain Wo Contrast  04/04/2014   CLINICAL DATA:  Encephalopathy  EXAM: MRI HEAD WITHOUT CONTRAST  TECHNIQUE: Multiplanar, multiecho pulse sequences  of the brain and surrounding structures were obtained without intravenous contrast.  COMPARISON:  CT 04/15/2014  FINDINGS: Image quality degraded by mild motion.  Generalized atrophy.  Negative for hydrocephalus.  Negative for acute infarct. Mild chronic microvascular ischemic change in the white matter.  Brainstem and cerebellum are normal. Negative for hemorrhage or mass.  Mild mucosal edema in the paranasal  sinuses.  IMPRESSION: No acute abnormality.   Electronically Signed   By: Franchot Gallo M.D.   On: 04/04/2014 12:08   Dg Chest Port 1 View  04/04/2014   CLINICAL DATA:  CHF  EXAM: PORTABLE CHEST - 1 VIEW  COMPARISON:  DG CHEST 1V PORT dated 03/28/2014; DG CHEST 1V PORT dated 12/16/2013  FINDINGS: Right upper extremity PICC line is present with tip projecting over the superior vena cava. Stable enlarged cardiac and mediastinal contours. Unchanged bilateral coarse interstitial pulmonary opacities. No pleural effusion or pneumothorax. Extensive bilateral glenohumeral joint degenerative change.  IMPRESSION: Cardiomegaly. Bilateral coarse interstitial opacities suggestive of pulmonary edema.   Electronically Signed   By: Lovey Newcomer M.D.   On: 04/04/2014 19:12     Assessment/Plan: Discitis/Osteo   Aspirate January (-) on anbx, vanco/ceftriaxone   Repeat MRI 4-24- worsening of osteo/discitis T10-11. Started on Dapto/cefepime 5-4  Encephalopathy, seizures  ARF  Pancreatitis (lipase 1219)   Total days of antibiotics: off  Would continue to watch her off anbx Watch her sz Explained to family.         Campbell Riches Infectious [facility] (pager) [phone] [email or web address] 04/06/2014, 10:22 AM  LOS: 3 days   **Disclaimer: This note may have been dictated with voice recognition software. Similar sounding words can inadvertently be transcribed and this note may contain transcription errors which may not have been corrected upon publication of note.**

## 2014-04-06 NOTE — Progress Notes (Signed)
UR completed.  Onofrio Klemp, RN BSN [facility] CCM Trauma/Neuro ICU Case Manager [phone]  

## 2014-04-06 NOTE — Progress Notes (Signed)
PT Cancellation Note  Patient Details Name: [name] [name] MRN: [id number] DOB: Apr 05, 1930   Cancelled Treatment:    Reason Eval/Treat Not Completed: Patient at procedure or test/unavailable. Pt remains on EEG. PT to return when appropriate.   Arelis Neumeier M Lilyann Gravelle 04/06/2014, 10:04 AM

## 2014-04-06 NOTE — Progress Notes (Signed)
Subjective: Continues to be sleepy, much calmer though  Exam: Filed Vitals:   04/06/14 0815  BP: 164/63  Pulse: 99  Temp:   Resp: 12   Gen: In bed, NAD MS: does not open eyes or follow commands.  XK:GYJEH, does nto fixate or rtack Motor: witihdraws x 4, ? If right is brisker withdrawal than left.  Sensory:responds to nox stim x 4.   Impression: 80 yo F with GPDs with triphasic morphology occurring at a frequency of 2 Hz. In this setting, I was concerned for NCSE and treated as such. With further monitoring, I do wonder if this was an encephalopathic pattern, but given the concerning nature, will continue AEDs for now. She continues to have occasional discharges, but resembling triphasic waves. I continue to suspect cefepime as an inciting event here, though renal failure alone as well can cause AMS.   Recommendations: 1) will d/c keppra 2) continue depakote for now.  3) will continue to follow.   Roland Rack, MD [facility] [phone]  If 7pm- 7am, please page neurology on call as listed in [place].

## 2014-04-06 NOTE — Procedures (Addendum)
Electroencephalogram report- LTM  Ordering Physician : Dr. Leonel Ramsay    Beginning date or time:  04/05/2014 9:32 AM Ending date or time: 04/06/2014 7:30 AM  Day of study: day 1  Medications include: Per EMR  MENTAL STATUS (per technician's notes):Intubated.  Sedated and unresponsive.  HISTORY: This is about 22 hours of video EEG monitoring performed for this patient with altered mental status. This EEG was requested to rule out subclinical electrographic seizures.  TECHNICAL DESCRIPTION:  The study consists of a continuous 16-channel multi-montage digital video EEG recording with twenty-one electrodes placed according to the International 10-20 System. Additional leads included an EKG lead. Activation procedures were not done due to mental status.  The EEG starts off with 2.5-3 Hz sharply contoured generalized discharges, Suspicious for generalized periodic epileptiform discharges (GPEDS), though most of them had triphasic morphology. These improve likely secondary to institution of medications and the background changes to delta and theta activity, With overriding faster frequencies in the frontal leads. During quiet periods, Theta activity was more common, however, with state change higher amplitude delta slowing again of triphasic morphology was seen with intermixed theta activity. There were eye blinks and overriding EMG artifact also seen during these periods.   No clear evolution was seen.  This was likely a paradoxical arousal, associated with encephalopathy.  There were no pushbutton activations events during this recording.   INTERPRETATION: This is an abnormal EEG due to: 1) Triphasic sharp waves, that in the beginning of the recording appear or periodic and suspicious for GPEDS 2) Diffuse background slowing  Clinical Correlation: This EEG was suspicious for non-convulsive status epilepticus given the periodic discharges seen in the early part of the recording, though the discharges  had triphasic morophology.  Aafter the initial portion of the recording and institution of the medications, the EEG is more consistent with encephalopathy with triphasic waveforms appearing with state change.  The report was verbally discussed with Dr.Kirkpatrick ....Marland KitchenMarland KitchenMarland Kitchen Addendum  Beginning date or time:  04/06/2014 7:30 AM Ending date or time: 04/07/2014 7:30 AM  Day of study: day 2  The background activity consists of delta and theta activity, with overriding faster frequencies in the frontal leads. During quiet periods, theta activity was more common, however, with state change higher amplitude delta slowing again of triphasic morphology were seen with intermixed theta activity. No clear evolution was seen.  This was likely a paradoxical arousal, associated with encephalopathy.  There were no pushbutton activations events during this recording.   INTERPRETATION: This is an abnormal EEG due to: 1) Triphasic sharp waves 2) Diffuse background slowing  Clinical Correlation: This EEG is consistent with moderate to severe encephalopathy with slow background and triphasic waveforms appearing with state change.

## 2014-04-06 NOTE — Progress Notes (Addendum)
PULMONARY / CRITICAL CARE MEDICINE  Name: MASSA PE MRN: [id number] DOB: 01-Dec-1929    ADMISSION DATE:  04/04/2014 CONSULTATION DATE:  04/05/2014  REFERRING MD :  Leonel Ramsay  PRIMARY SERVICE: PCCM   CHIEF COMPLAINT:  Acute encephalopathy  BRIEF PATIENT DESCRIPTION: 80 yo female with HTN, DM, pancreatitis, CKD4, vertebral osteomyelitis on IV abx ( recently changed to Cefepime ) admitted 5/16 with AMS.  Evaluated by neurology and continuous EEG revealed status epilepticus.  SIGNIFICANT EVENTS / STUDIES:  5/18 Continuous EEG >>> NCSE  LINES / TUBES: RUE PICC (pta) >>>  CULTURES: 5/16  Urine  >>> neg  5/16  Blood >>>  ANTIBIOTICS:  INTERVAL HISTORY:  No overt seizure activity overnight.  Episode of hypoglycemia overnight.  VITAL SIGNS: Temp:  [97.4 F (36.3 C)-98.5 F (36.9 C)] 98.3 F (36.8 C) (05/19 0336) Pulse Rate:  [83-132] 104 (05/19 0615) Resp:  [8-28] 10 (05/19 0615) BP: (118-207)/(44-87) 160/65 mmHg (05/19 0615) SpO2:  [93 %-100 %] 100 % (05/19 0615) Weight:  [93.7 kg (206 lb 9.1 oz)] 93.7 kg (206 lb 9.1 oz) (05/19 0336)  HEMODYNAMICS:   VENTILATOR SETTINGS:   INTAKE / OUTPUT: Intake/Output     05/18 0701 - 05/19 0700 05/19 0701 - 05/20 0700   I.V. (mL/kg) 585 (6.2)    IV Piggyback 545    Total Intake(mL/kg) 1130 (12.1)    Urine (mL/kg/hr) 1535 (0.7)    Total Output 1535     Net -405            PHYSICAL EXAMINATION: General:  No distress Neuro:  No overt seizure activity, opens eyes to stimulation, does not follow commands, cough / gag diminished HEENT:  No JVD Cardiovascular:  Regular, no murmurs Lungs:  CTAB Abdomen:  Soft, bowel sounds present Musculoskeletal:  Warm and dry, no edema  LABS:  CBC  Recent Labs Lab 03/26/2014 1034 03/28/2014 1105 04/04/14 0513 04/05/14 0800  WBC 9.3  --  9.7 10.8*  HGB 9.8* 10.5* 9.6* 8.9*  HCT 29.9* 31.0* 28.5* 26.5*  PLT 203  --  182 149*   Coag's  Recent Labs Lab 04/10/2014 1855  APTT 37  INR  1.33   BMET  Recent Labs Lab 04/04/14 1753 04/05/14 0800 04/05/14 1800  NA 143 145 147  K 3.3* 3.3* 4.2  CL 107 107 113*  CO2 21 19 21   BUN 43* 45* 47*  CREATININE 4.25* 4.46* 4.52*  GLUCOSE 101* 107* 87   Electrolytes  Recent Labs Lab 04/11/2014 1023 04/02/2014 1855 04/04/14 0513 04/04/14 1753 04/05/14 0800 04/05/14 1800  CALCIUM 11.6* 11.1* 11.0* 10.8* 10.7* 10.7*  MG  --  2.2  --   --   --   --   PHOS  --  3.0 3.0  --   --   --    Sepsis Markers  Recent Labs Lab 04/14/2014 1106  LATICACIDVEN 0.64   ABG No results found for this basename: PHART, PCO2ART, PO2ART,  in the last 168 hours  Liver Enzymes  Recent Labs Lab 03/22/2014 1023 04/04/14 0513  AST 71*  --   ALT 29  --   ALKPHOS 64  --   BILITOT 0.3  --   ALBUMIN 2.4* 2.2*   Cardiac Enzymes  Recent Labs Lab 04/04/14 0513 04/04/14 1230  TROPONINI <0.30 <0.30  PROBNP  --  6433.0*   Glucose  Recent Labs Lab 04/04/14 1750 04/05/14 0824 04/05/14 1611 04/05/14 2008 04/06/14 0002 04/06/14 0335  GLUCAP 99 74 64* 79 79  48   IMAGING:  Mr Brain Wo Contrast  04/04/2014   CLINICAL DATA:  Encephalopathy  EXAM: MRI HEAD WITHOUT CONTRAST  TECHNIQUE: Multiplanar, multiecho pulse sequences of the brain and surrounding structures were obtained without intravenous contrast.  COMPARISON:  CT 2014/05/02  FINDINGS: Image quality degraded by mild motion.  Generalized atrophy.  Negative for hydrocephalus.  Negative for acute infarct. Mild chronic microvascular ischemic change in the white matter.  Brainstem and cerebellum are normal. Negative for hemorrhage or mass.  Mild mucosal edema in the paranasal sinuses.  IMPRESSION: No acute abnormality.   Electronically Signed   By: Franchot Gallo M.D.   On: 04/04/2014 12:08   Dg Chest Port 1 View  04/04/2014   CLINICAL DATA:  CHF  EXAM: PORTABLE CHEST - 1 VIEW  COMPARISON:  DG CHEST 1V PORT dated May 02, 2014; DG CHEST 1V PORT dated 12/16/2013  FINDINGS: Right upper extremity  PICC line is present with tip projecting over the superior vena cava. Stable enlarged cardiac and mediastinal contours. Unchanged bilateral coarse interstitial pulmonary opacities. No pleural effusion or pneumothorax. Extensive bilateral glenohumeral joint degenerative change.  IMPRESSION: Cardiomegaly. Bilateral coarse interstitial opacities suggestive of pulmonary edema.   Electronically Signed   By: Lovey Newcomer M.D.   On: 04/04/2014 19:12   ASSESSMENT / PLAN:  PULMONARY A: Protects airway At risk for respiratory failure / need of ETT P:   Goal SpO2>92 Supplemental oxygen PRN  CARDIOVASCULAR A: HTN  Chronic diastolic heart failure P:  Goal BP<160/90 Clonidine patch  Labetalol PRN   RENAL A: CKD AKI Hypercalcemia Hypernatremia Hypovolemia P:   Renal following No indication HD currently , not a great candidate for HD in general Trend BMP, Ca  Change IVF to D5+1/2NS Lasix 40 q12h for hypercalcemia  GASTROINTESTINAL A: Pancreatitis, non biliary Diarrhea Nutrition GIPx is not indicated P:   GI following  Place rectal tube NPO  HEMATOLOGIC A: Anemia VTE Px P:  Trend CBC Heparin  INFECTIOUS A: Vertebral osteomyelitis  UTI P:   ID following  Cefepime d/c'd for suspected neurotoxicity Abx ?  ENDOCRINE A: DM   Hypoglycemia P:   SSI  NEUROLOGIC A: Acute encephalopathy Seizure, suspected Cefepime induced Status epilepticus P:   Neurology following  Continuous EEG  Keppra / Valproate  I have personally obtained history, examined patient, evaluated and interpreted laboratory and imaging results, reviewed medical records, formulated assessment / plan and placed orders.  CRITICAL CARE:  The patient is critically ill with multiple organ systems failure and requires high complexity decision making for assessment and support, frequent evaluation and titration of therapies, application of advanced monitoring technologies and extensive interpretation of  multiple databases. Critical Care Time devoted to patient care services described in this note is 35 minutes.   Doree Fudge, MD Pulmonary and [place] Pager: [phone]  04/06/2014, 8:27 AM

## 2014-04-07 LAB — UREA NITROGEN (UUN), 24 HR URINE
Collection Interval-UUN: 24 hours
Urea Nitrogen, 24H Ur: 2667 mg/d — ABNORMAL LOW (ref 6000–17000)
Urea Nitrogen, Ur: 127 mg/dL
Urine Total Volume-UUN: 2100 mL

## 2014-04-07 LAB — GLUCOSE, CAPILLARY
GLUCOSE-CAPILLARY: 112 mg/dL — AB (ref 70–99)
GLUCOSE-CAPILLARY: 124 mg/dL — AB (ref 70–99)
Glucose-Capillary: 109 mg/dL — ABNORMAL HIGH (ref 70–99)
Glucose-Capillary: 118 mg/dL — ABNORMAL HIGH (ref 70–99)
Glucose-Capillary: 96 mg/dL (ref 70–99)
Glucose-Capillary: 98 mg/dL (ref 70–99)

## 2014-04-07 LAB — BASIC METABOLIC PANEL
BUN: 49 mg/dL — AB (ref 6–23)
CALCIUM: 10.9 mg/dL — AB (ref 8.4–10.5)
CO2: 22 meq/L (ref 19–32)
CREATININE: 4.84 mg/dL — AB (ref 0.50–1.10)
Chloride: 110 mEq/L (ref 96–112)
GFR calc Af Amer: 9 mL/min — ABNORMAL LOW (ref >90)
GFR, EST NON AFRICAN AMERICAN: 8 mL/min — AB (ref >90)
GLUCOSE: 128 mg/dL — AB (ref 70–99)
Potassium: 3.5 mEq/L — ABNORMAL LOW (ref 3.7–5.3)
Sodium: 146 mEq/L (ref 137–147)

## 2014-04-07 LAB — MAGNESIUM: MAGNESIUM: 2.2 mg/dL (ref 1.5–2.5)

## 2014-04-07 LAB — CBC
HCT: 26.8 % — ABNORMAL LOW (ref 36.0–46.0)
Hemoglobin: 8.8 g/dL — ABNORMAL LOW (ref 12.0–15.0)
MCH: 27.9 pg (ref 26.0–34.0)
MCHC: 32.8 g/dL (ref 30.0–36.0)
MCV: 85.1 fL (ref 78.0–100.0)
PLATELETS: 159 10*3/uL (ref 150–400)
RBC: 3.15 MIL/uL — ABNORMAL LOW (ref 3.87–5.11)
RDW: 17.2 % — AB (ref 11.5–15.5)
WBC: 8.9 10*3/uL (ref 4.0–10.5)

## 2014-04-07 LAB — PHOSPHORUS: Phosphorus: 4.1 mg/dL (ref 2.3–4.6)

## 2014-04-07 LAB — VALPROIC ACID LEVEL: Valproic Acid Lvl: 46.5 ug/mL — ABNORMAL LOW (ref 50.0–100.0)

## 2014-04-07 MED ORDER — INSULIN ASPART 100 UNIT/ML ~~LOC~~ SOLN
0.0000 [IU] | SUBCUTANEOUS | Status: DC
  Administered 2014-04-07 – 2014-04-09 (×8): 2 [IU] via SUBCUTANEOUS
  Administered 2014-04-10: 3 [IU] via SUBCUTANEOUS
  Administered 2014-04-10: 2 [IU] via SUBCUTANEOUS
  Administered 2014-04-10 (×3): 3 [IU] via SUBCUTANEOUS
  Administered 2014-04-11 (×3): 2 [IU] via SUBCUTANEOUS
  Administered 2014-04-11: 3 [IU] via SUBCUTANEOUS
  Administered 2014-04-11: 2 [IU] via SUBCUTANEOUS
  Administered 2014-04-11 (×2): 3 [IU] via SUBCUTANEOUS
  Administered 2014-04-12: 2 [IU] via SUBCUTANEOUS
  Administered 2014-04-12: 3 [IU] via SUBCUTANEOUS
  Administered 2014-04-12 (×4): 2 [IU] via SUBCUTANEOUS
  Administered 2014-04-13: 3 [IU] via SUBCUTANEOUS
  Administered 2014-04-13: 2 [IU] via SUBCUTANEOUS
  Administered 2014-04-13 – 2014-04-15 (×10): 3 [IU] via SUBCUTANEOUS
  Administered 2014-04-15 (×2): 5 [IU] via SUBCUTANEOUS
  Administered 2014-04-16 – 2014-04-17 (×9): 3 [IU] via SUBCUTANEOUS
  Administered 2014-04-17: 5 [IU] via SUBCUTANEOUS
  Administered 2014-04-17 – 2014-04-18 (×2): 3 [IU] via SUBCUTANEOUS
  Administered 2014-04-18: 5 [IU] via SUBCUTANEOUS
  Administered 2014-04-18 (×2): 3 [IU] via SUBCUTANEOUS
  Administered 2014-04-18 (×2): 5 [IU] via SUBCUTANEOUS
  Administered 2014-04-19: 3 [IU] via SUBCUTANEOUS
  Administered 2014-04-19: 2 [IU] via SUBCUTANEOUS
  Administered 2014-04-19: 3 [IU] via SUBCUTANEOUS
  Administered 2014-04-19: 2 [IU] via SUBCUTANEOUS
  Administered 2014-04-19: 3 [IU] via SUBCUTANEOUS
  Administered 2014-04-20: 2 [IU] via SUBCUTANEOUS

## 2014-04-07 MED ORDER — POTASSIUM CHLORIDE 10 MEQ/50ML IV SOLN
10.0000 meq | INTRAVENOUS | Status: AC
  Administered 2014-04-07 (×2): 10 meq via INTRAVENOUS
  Filled 2014-04-07 (×2): qty 50

## 2014-04-07 NOTE — Progress Notes (Signed)
INFECTIOUS DISEASE PROGRESS NOTE  ID: [name] [name] is a 80 y.o. female with  Principal Problem:   Seizures Active Problems:   Osteomyelitis of thoracic spine   Acute encephalopathy   Pancreatitis, acute   Acute on chronic renal failure   Acute on chronic kidney failure   Non-convulsive status epilepticus   Protein-calorie malnutrition, severe  Subjective: More awake, interacts with nursing.   Abtx:  Anti-infectives   Start     Dose/Rate Route Frequency Ordered Stop   04/04/14 1000  ceFEPIme (MAXIPIME) 1 g in dextrose 5 % 50 mL IVPB  Status:  Discontinued     1 g 100 mL/hr over 30 Minutes Intravenous Every 24 hours 04/02/2014 1821 04/04/14 0931   04/04/14 1000  DAPTOmycin (CUBICIN) 530.5 mg in sodium chloride 0.9 % IVPB  Status:  Discontinued     6 mg/kg  88.4 kg 221.2 mL/hr over 30 Minutes Intravenous Every 24 hours 03/20/2014 1821 04/04/14 0138   04/17/2014 1230  ciprofloxacin (CIPRO) IVPB 400 mg     400 mg 200 mL/hr over 60 Minutes Intravenous  Once 04/18/2014 1225 03/29/2014 1423   04/02/2014 1200  cefTRIAXone (ROCEPHIN) 1 g in dextrose 5 % 50 mL IVPB  Status:  Discontinued     1 g 100 mL/hr over 30 Minutes Intravenous  Once 03/25/2014 1147 03/27/2014 1147   04/09/2014 1200  ceFEPIme (MAXIPIME) 2 g in dextrose 5 % 50 mL IVPB  Status:  Discontinued     2 g 100 mL/hr over 30 Minutes Intravenous Every 12 hours 03/29/2014 1148 04/18/2014 1225      Medications:  Scheduled: . antiseptic oral rinse  15 mL Mouth Rinse q12n4p  . chlorhexidine  15 mL Mouth Rinse BID  . cloNIDine  0.1 mg Transdermal Weekly  . darbepoetin (ARANESP) injection - NON-DIALYSIS  100 mcg Subcutaneous Q Tue-1800  . heparin  5,000 Units Subcutaneous 3 times per day  . insulin aspart  0-15 Units Subcutaneous 6 times per day  . lidocaine  1 patch Transdermal Q24H  . potassium chloride  10 mEq Intravenous Q1 Hr x 2  . valproate sodium  500 mg Intravenous 3 times per day    Objective: Vital signs in last 24  hours: Temp:  [97.3 F (36.3 C)-98.9 F (37.2 C)] 98 F (36.7 C) (05/20 0805) Pulse Rate:  [79-105] 86 (05/20 0800) Resp:  [10-20] 17 (05/20 1000) BP: (121-204)/(37-81) 169/70 mmHg (05/20 1000) SpO2:  [98 %-100 %] 98 % (05/20 0800) Weight:  [90.2 kg (198 lb 13.7 oz)] 90.2 kg (198 lb 13.7 oz) (05/20 0500)   General appearance: fatigued and tracks, opens eyes to voice. does not vocalize to me.  Resp: clear to auscultation bilaterally Cardio: regular rate and rhythm GI: normal findings: bowel sounds normal and soft, non-tender  Lab Results  Recent Labs  04/05/14 0800  04/06/14 1800 04/07/14 0420 04/07/14 0740  WBC 10.8*  --   --  8.9  --   HGB 8.9*  --   --  8.8*  --   HCT 26.5*  --   --  26.8*  --   NA 145  < > 146  --  146  K 3.3*  < > 3.6*  --  3.5*  CL 107  < > 109  --  110  CO2 19  < > 22  --  22  BUN 45*  < > 49*  --  49*  CREATININE 4.46*  < > 4.79*  --  4.84*  < > = values in this interval not displayed. Liver Panel No results found for this basename: PROT, ALBUMIN, AST, ALT, ALKPHOS, BILITOT, BILIDIR, IBILI,  in the last 72 hours Sedimentation Rate  Recent Labs  04/04/14 1230  ESRSEDRATE 103*   C-Reactive Protein  Recent Labs  04/04/14 1230  CRP 2.9*    Microbiology: Recent Results (from the past 240 hour(s))  URINE CULTURE     Status: None   Collection Time    04/06/2014 10:53 AM      Result Value Ref Range Status   Specimen Description URINE, RANDOM   Final   Special Requests NONE   Final   Culture  Setup Time     Final   Value: 04/01/2014 19:46     Performed at [place]     Final   Value: 3,000 COLONIES/ML     Performed at [facility]   Culture     Final   Value: INSIGNIFICANT GROWTH     Performed at [facility]   Report Status 04/05/2014 FINAL   Final  CULTURE, BLOOD (ROUTINE X 2)     Status: None   Collection Time    03/29/2014 10:55 AM      Result Value Ref Range Status   Specimen  Description BLOOD PICC LINE   Final   Special Requests BOTTLES DRAWN AEROBIC AND ANAEROBIC 10CC   Final   Culture  Setup Time     Final   Value: 03/22/2014 18:58     Performed at [facility]   Culture     Final   Value:        BLOOD CULTURE RECEIVED NO GROWTH TO DATE CULTURE WILL BE HELD FOR 5 DAYS BEFORE ISSUING A FINAL NEGATIVE REPORT     Performed at [facility]   Report Status PENDING   Incomplete  CULTURE, BLOOD (ROUTINE X 2)     Status: None   Collection Time    03/30/2014 11:00 AM      Result Value Ref Range Status   Specimen Description BLOOD RIGHT HAND   Final   Special Requests BOTTLES DRAWN AEROBIC ONLY 10CC   Final   Culture  Setup Time     Final   Value: 04/18/2014 18:58     Performed at [facility]   Culture     Final   Value:        BLOOD CULTURE RECEIVED NO GROWTH TO DATE CULTURE WILL BE HELD FOR 5 DAYS BEFORE ISSUING A FINAL NEGATIVE REPORT     Performed at [facility]   Report Status PENDING   Incomplete  MRSA PCR SCREENING     Status: None   Collection Time    04/04/14  5:32 PM      Result Value Ref Range Status   MRSA by PCR NEGATIVE  NEGATIVE Final   Comment:            The GeneXpert MRSA Assay (FDA     approved for NASAL specimens     only), is one component of a     comprehensive MRSA colonization     surveillance program. It is not     intended to diagnose MRSA     infection nor to guide or     monitor treatment for     MRSA infections.    Studies/Results: No results found.   Assessment/Plan: Discitis/Osteo   Aspirate  January (-) on anbx, vanco/ceftriaxone   Repeat MRI 4-24- worsening of osteo/discitis T10-11. Started on Dapto/cefepime 5-4  Encephalopathy, seizures (due to cefepime) ARF  Pancreatitis (lipase 1219)   Total days of antibiotics: off  Would continue to watch her off anbx  Await her improvement in neuro fxn til we restart anbx (non-cephalosporin).  Seizure monitoring has been stopped.            [facility] (pager) [phone] [email or web address] 04/07/2014, 12:24 PM  LOS: 4 days   **Disclaimer: This note may have been dictated with voice recognition software. Similar sounding words can inadvertently be transcribed and this note may contain transcription errors which may not have been corrected upon publication of note.**

## 2014-04-07 NOTE — Progress Notes (Signed)
LTM EEG D/C'd per Dr Kirkpatrick 

## 2014-04-07 NOTE — Clinical Social Work Note (Signed)
Clinical Social Worker continuing to follow patient and family for support and discharge planning needs.  CSW spoke with patient daughter in [place] hallway to confirm that patient was a resident at [facility] and would like to return at discharge.  CSW spoke with facility who is agreeable with patient return.  CSW to submit clinical information to facility for [facility] authorization once patient is close to medical readiness.  CSW remains available for support and to facilitate patient discharge needs once medically ready.  Barbette Or, [place]

## 2014-04-07 NOTE — Procedures (Signed)
ELECTROENCEPHALOGRAM REPORT   Patient: [name] [name]       Room #: 6E83 EEG No. ID: [id number] Age: 80 y.o.        Sex: female Referring Physician: Leonel Ramsay Report Date:  04/07/2014        Interpreting Physician: Alexis Goodell  History: [name] is an 80 y.o. female presenting with NCSE on Cefepime  Medications:  Scheduled: . antiseptic oral rinse  15 mL Mouth Rinse q12n4p  . chlorhexidine  15 mL Mouth Rinse BID  . cloNIDine  0.1 mg Transdermal Weekly  . darbepoetin (ARANESP) injection - NON-DIALYSIS  100 mcg Subcutaneous Q Tue-1800  . heparin  5,000 Units Subcutaneous 3 times per day  . insulin aspart  0-15 Units Subcutaneous 6 times per day  . lidocaine  1 patch Transdermal Q24H  . valproate sodium  500 mg Intravenous 3 times per day    Conditions of Recording:  This is a 16 channel EEG carried out with accompanying video on 04/07/2014 from 0730 to 0954.  The patient is in the sedated and unresponsive state.  Description:  The background activity is slow and of low voltage.  It consists mostly of a polymorphic delta activity with some intermixed theta activity seen.  This activity is continuous.  During the beginning of the tracing there are intermittent periodic discharges of triphasic morphology.  As the tracing progresses these discharges become less frequent and by the end of the tracing are negligible.    Hyperventilation and intermittent photic stimulation were not performed.  There were no pushbutton activations events during this recording   IMPRESSION: This is an abnormal electroencephalogram secondary to general background slowing and frequent triphasic waves that are noted most prominently at the beginning of the tracing but become less frequent as the tracing progresses.  This finding is most consistent with an encephalopathy.       Alexis Goodell, MD [facility] [phone] 04/07/2014, 8:07 PM

## 2014-04-07 NOTE — Progress Notes (Signed)
Subjective:   blood pressure up and down- did make 1600 of urine last 24 hours off lasix  Creatinine essentially stable. Neuro, ID and GI all involved.  Now has been diagnosed with status thought maybe related to cefepime- on depakote/ativan/keppra Objective Vital signs in last 24 hours: Filed Vitals:   04/07/14 0600 04/07/14 0700 04/07/14 0800 04/07/14 0805  BP: 168/53 174/56 168/50   Pulse: 91 79 86   Temp:    98 F (36.7 C)  TempSrc:    Oral  Resp: 19 19 20    Height:      Weight:      SpO2: 100% 100% 98%    Weight change: -3.5 kg (-7 lb 11.5 oz)  Intake/Output Summary (Last 24 hours) at 04/07/14 0919 Last data filed at 04/07/14 0700  Gross per 24 hour  Intake 1723.75 ml  Output   1200 ml  Net 523.75 ml    Assessment/ Plan: Pt is a 80 y.o. yo female who was admitted on 04-30-2014 with confusion. She has A on CKD in the setting of being on abx for osteo but also possibly some volume depletion with hypercalcemia  Assessment/Plan: 1. A on CKD- baseline creatinine is in the mid to high ones earlier this year... is making urine-.  No indications for dialysis to date.  i am not sure would be candidate for long term dialysis given everything that is going on with her right now.  Heavy discussions would need to be held with family before I would consider short term dialysis either 2. HTN/vol- status is difficult to determine because originally thought was dry but hypertensive as well- I agree that I would give IVF- changed to d5 1/2 for sodium which is a little high-  Clonidine patch and prns right now- sometimes BP is good (maybe after PRNs- no change right now 3. Anemia- low/stable- have added aranesp and watch for transfusion need 4. ID- hx of osteo- now not on anything 5. Pancreatis - supportive care- numbers seem to be improving 6. Status epilepticus- per neuro- moving to neuro ICU to sedate-keppra/depakote/ativan 7. Elytes- hydration for hypercalcemia-  replacement for  hypokalemia 8. Dispo- many severe issues for an 80 year old female- not sure prognosis is good.   Louis Meckel    Labs: Basic Metabolic Panel:  Recent Labs Lab April 30, 2014 1855 04/04/14 0513  04/06/14 0754 04/06/14 1800 04/07/14 0420 04/07/14 0740  NA  --  142  < > 146 146  --  146  K  --  3.1*  < > 3.7 3.6*  --  3.5*  CL  --  106  < > 111 109  --  110  CO2  --  22  < > 21 22  --  22  GLUCOSE  --  95  < > 77 93  --  128*  BUN  --  45*  < > 48* 49*  --  49*  CREATININE  --  4.23*  < > 4.79* 4.79*  --  4.84*  CALCIUM 11.1* 11.0*  < > 10.8* 10.9*  --  10.9*  PHOS 3.0 3.0  --   --   --  4.1  --   < > = values in this interval not displayed. Liver Function Tests:  Recent Labs Lab 2014-04-30 1023 04/04/14 0513  AST 71*  --   ALT 29  --   ALKPHOS 64  --   BILITOT 0.3  --   PROT 7.0  --  ALBUMIN 2.4* 2.2*    Recent Labs Lab 03/25/2014 1023 04/05/14 0800 04/06/14 0754  LIPASE 1219* 247* 174*  AMYLASE  --   --  113*    Recent Labs Lab 04/04/2014 1855  AMMONIA 21   CBC:  Recent Labs Lab 04/17/2014 1034  04/04/14 0513 04/05/14 0800 04/07/14 0420  WBC 9.3  --  9.7 10.8* 8.9  NEUTROABS 5.9  --  6.1 5.7  --   HGB 9.8*  < > 9.6* 8.9* 8.8*  HCT 29.9*  < > 28.5* 26.5* 26.8*  MCV 84.7  --  83.8 84.9 85.1  PLT 203  --  182 149* 159  < > = values in this interval not displayed. Cardiac Enzymes:  Recent Labs Lab 03/27/2014 1855 04/04/14 0513 04/04/14 1230 04/05/14 0800  CKTOTAL 584* 621*  --  701*  TROPONINI  --  <0.30 <0.30  --    CBG:  Recent Labs Lab 04/06/14 1205 04/06/14 1613 04/06/14 2014 04/06/14 2350 04/07/14 0347  GLUCAP 65* 75 84 98 109*    Iron Studies:  No results found for this basename: IRON, TIBC, TRANSFERRIN, FERRITIN,  in the last 72 hours Studies/Results: No results found. Medications: Infusions: . dextrose 5 % and 0.45% NaCl 75 mL/hr at 04/07/14 1007    Scheduled Medications: . antiseptic oral rinse  15 mL Mouth Rinse  q12n4p  . chlorhexidine  15 mL Mouth Rinse BID  . cloNIDine  0.1 mg Transdermal Weekly  . darbepoetin (ARANESP) injection - NON-DIALYSIS  100 mcg Subcutaneous Q Tue-1800  . heparin  5,000 Units Subcutaneous 3 times per day  . insulin aspart  0-15 Units Subcutaneous 6 times per day  . lidocaine  1 patch Transdermal Q24H  . potassium chloride  10 mEq Intravenous Q1 Hr x 2  . valproate sodium  500 mg Intravenous 3 times per day    have reviewed scheduled and prn medications.  Physical Exam: General: moaning - no purposeful movements- daughter felt like more responsive but I am not seeing Heart: tachy Lungs: difficult to examine- lying flat with good O2 sat Abdomen: obese, reacts to palpation Extremities: trace edema    04/07/2014,9:19 AM  LOS: 4 days

## 2014-04-07 NOTE — Evaluation (Signed)
Physical Therapy Evaluation Patient Details Name: [name] [name] MRN: [id number] DOB: [birth year]  Today's Date: 04/07/2014   History of Present Illness  80 yo female with HTN, DM, pancreatitis, CKD4, vertebral osteomyelitis on IV abx ( recently changed to Cefepime ) admitted 5/16 with AMS.  Evaluated by neurology and continuous EEG revealed status epilepticus.  Clinical Impression  Patient demonstrates deficits as indicated. Will need continued skilled PT to address deficits and decreased BOC. Will recommend return to SNF upon acute discharge.    Follow Up Recommendations SNF;Supervision/Assistance - 24 hour    Equipment Recommendations  None recommended by PT    Recommendations for Other Services       Precautions / Restrictions Precautions Precautions: Fall      Mobility  Bed Mobility Overal bed mobility: Needs Assistance;+2 for physical assistance Bed Mobility: Supine to Sit;Sit to Supine     Supine to sit: Total assist;+2 for physical assistance Sit to supine: Total assist;+2 for physical assistance   General bed mobility comments: patient with some arousal upon sitting but no initiation   Transfers                    Ambulation/Gait                Stairs            Wheelchair Mobility    Modified Rankin (Stroke Patients Only)       Balance Overall balance assessment: Needs assistance Sitting-balance support: Feet supported Sitting balance-Leahy Scale: Zero   Postural control: Posterior lean;Right lateral lean                                   Pertinent Vitals/Pain NAD    Home Living Family/patient expects to be discharged to:: Skilled nursing facility                 Additional Comments: Adm from [facility] and plans to return    Prior Function Level of Independence: Needs assistance         Comments: history of falls     Hand Dominance        Extremity/Trunk Assessment   Upper Extremity  Assessment: Difficult to assess due to impaired cognition           Lower Extremity Assessment: Difficult to assess due to impaired cognition         Communication      Cognition Arousal/Alertness: Lethargic Behavior During Therapy: Flat affect Overall Cognitive Status: Difficult to assess                      General Comments General comments (skin integrity, edema, etc.): performed PROM to bilateral LEs UEs and trunk. Patient demonstrated active RLE movement once upon command but no other active or initiated movement at this time    Exercises        Assessment/Plan    PT Assessment Patient needs continued PT services  PT Diagnosis     PT Problem List Decreased strength;Decreased range of motion;Decreased activity tolerance;Decreased balance;Decreased mobility;Decreased cognition  PT Treatment Interventions DME instruction;Functional mobility training;Therapeutic activities;Therapeutic exercise;Balance training;Patient/family education   PT Goals (Current goals can be found in the Care Plan section) Acute Rehab PT Goals PT Goal Formulation: Patient unable to participate in goal setting Time For Goal Achievement: 05-02-14 Potential to Achieve Goals: Fair    Frequency Min 2X/week  Barriers to discharge        Co-evaluation               End of Session Equipment Utilized During Treatment: Oxygen Activity Tolerance: Patient limited by fatigue;Patient limited by lethargy Patient left: in bed;with call bell/phone within reach Nurse Communication: Mobility status;Need for lift equipment         Time: 6283-6629 PT Time Calculation (min): 19 min   Charges:   PT Evaluation $Initial PT Evaluation Tier I: 1 Procedure PT Treatments $Therapeutic Activity: 8-22 mins   PT G Codes:          [name] [name] 04/07/2014, 4:43 PM [name] [name], [place] DPT  [phone]

## 2014-04-07 NOTE — Progress Notes (Signed)
Subjective: More awake.   Exam: Filed Vitals:   04/07/14 1600  BP: 150/45  Pulse: 90  Temp:   Resp: 16   Gen: In bed, NAD MS: opens eyes to noxious stimuli.   OT:LXBWI, appears to fixate today.  Motor: follows commands on right side, not as brisk with left, wiggles toes bilaterally.  Sensory:responds to nox stim x 4.   Impression: 80 yo F with GPDs with triphasic morphology occurring at a frequency of 2 Hz. In this setting, I was concerned for NCSE and treated as such. With further monitoring, I do wonder if this was an encephalopathic pattern, but given the concerning nature, will continue AEDs for now. She continues to have occasional discharges, but resembling triphasic waves. I continue to suspect cefepime as an inciting event here.  Recommendations: 1) continue depakote for now, though the patient will NOT need long term AED therapy.  2) will continue to follow.   Roland Rack, MD [facility] [phone]  If 7pm- 7am, please page neurology on call as listed in [place].

## 2014-04-07 NOTE — Progress Notes (Signed)
PULMONARY / CRITICAL CARE MEDICINE  Name: [name] [name] MRN: [id number] DOB: Feb 10, 1930    ADMISSION DATE:  03/28/2014 CONSULTATION DATE:  04/05/2014  REFERRING MD :  Leonel Ramsay  PRIMARY SERVICE: PCCM   CHIEF COMPLAINT:  Acute encephalopathy  BRIEF PATIENT DESCRIPTION: 80 yo female with HTN, DM, pancreatitis, CKD4, vertebral osteomyelitis on IV abx ( recently changed to Cefepime ) admitted 5/16 with AMS.  Evaluated by neurology and continuous EEG revealed status epilepticus.  SIGNIFICANT EVENTS / STUDIES:  5/18 Continuous EEG >>> NCSE  LINES / TUBES: RUE PICC (pta) >>>  CULTURES: 5/16  Urine  >>> neg  5/16  Blood >>>  ANTIBIOTICS:  INTERVAL HISTORY:  No acute events overnight  VITAL SIGNS: Temp:  [97.3 F (36.3 C)-98.9 F (37.2 C)] 98 F (36.7 C) (05/20 0805) Pulse Rate:  [84-105] 91 (05/20 0600) Resp:  [0-20] 19 (05/20 0600) BP: (121-204)/(37-81) 168/53 mmHg (05/20 0600) SpO2:  [99 %-100 %] 100 % (05/20 0600) Weight:  [90.2 kg (198 lb 13.7 oz)] 90.2 kg (198 lb 13.7 oz) (05/20 0500)  HEMODYNAMICS:   VENTILATOR SETTINGS:   INTAKE / OUTPUT: Intake/Output     05/19 0701 - 05/20 0700 05/20 0701 - 05/21 0700   I.V. (mL/kg) 1713.8 (19)    IV Piggyback 260    Total Intake(mL/kg) 1973.8 (21.9)    Urine (mL/kg/hr) 1600 (0.7)    Total Output 1600     Net +373.8            PHYSICAL EXAMINATION: General:  No distress Neuro:  Sleepy but wakes up to stimulation, drowsy HEENT:  No JVD Cardiovascular:  Regular, no murmurs Lungs:  Few rhonchi Abdomen:  Soft, bowel sounds present Musculoskeletal:  Warm and dry, no edema  LABS:  CBC  Recent Labs Lab 04/04/14 0513 04/05/14 0800 04/07/14 0420  WBC 9.7 10.8* 8.9  HGB 9.6* 8.9* 8.8*  HCT 28.5* 26.5* 26.8*  PLT 182 149* 159   Coag's  Recent Labs Lab 03/22/2014 1855  APTT 37  INR 1.33   BMET  Recent Labs Lab 04/06/14 0754 04/06/14 1800 04/07/14 0740  NA 146 146 146  K 3.7 3.6* 3.5*  CL 111 109 110   CO2 21 22 22   BUN 48* 49* 49*  CREATININE 4.79* 4.79* 4.84*  GLUCOSE 77 93 128*   Electrolytes  Recent Labs Lab 04/15/2014 1023 03/24/2014 1855 04/04/14 0513  04/06/14 0754 04/06/14 1800 04/07/14 0420 04/07/14 0740  CALCIUM 11.6* 11.1* 11.0*  < > 10.8* 10.9*  --  10.9*  MG  --  2.2  --   --   --   --  2.2  --   PHOS  --  3.0 3.0  --   --   --  4.1  --   < > = values in this interval not displayed. Sepsis Markers  Recent Labs Lab 04/18/2014 1106  LATICACIDVEN 0.64   ABG No results found for this basename: PHART, PCO2ART, PO2ART,  in the last 168 hours  Liver Enzymes  Recent Labs Lab 04/18/2014 1023 04/04/14 0513  AST 71*  --   ALT 29  --   ALKPHOS 64  --   BILITOT 0.3  --   ALBUMIN 2.4* 2.2*   Cardiac Enzymes  Recent Labs Lab 04/04/14 0513 04/04/14 1230  TROPONINI <0.30 <0.30  PROBNP  --  6433.0*   Glucose  Recent Labs Lab 04/06/14 0750 04/06/14 1205 04/06/14 1613 04/06/14 2014 04/06/14 2350 04/07/14 0347  GLUCAP 79 65* 75 84  98 109*   IMAGING:  No results found. ASSESSMENT / PLAN:  PULMONARY A: Protects airway At risk for respiratory failure / need of ETT P:   Goal SpO2>92 Supplemental oxygen PRN Xopenex PRN  CARDIOVASCULAR A: HTN  Chronic diastolic heart failure P:  Goal BP<160/90 Clonidine patch  Labetalol and Hydralazine PRN   RENAL A: CKD AKI Hypercalcemia Hypernatremia Hypovolemia P:   Renal following No indication HD currently , not a great candidate for HD in general Trend BMP, Ca  D5+1/2NS @ 75 Lasix d/c'd K 10 x 2  GASTROINTESTINAL A: Pancreatitis, non biliary Nutrition GIPx is not indicated At risk for dysphagia / aspiration P:   GI following  NPO OK to feed from GI standpoint, but would wait 24 hours until mental status improved  HEMATOLOGIC A: Anemia VTE Px P:  Trend CBC Heparin Aranesp  INFECTIOUS A: Vertebral osteomyelitis  UTI P:   ID following, deferring abx for now Cefepime d/c'd  for suspected neurotoxicity  ENDOCRINE A: DM   Hypoglycemia P:   CBG/SSI  NEUROLOGIC A: Acute encephalopathy, improving Seizure, suspected Cefepime induced Status epilepticus, resolving P:   Neurology following  Continuous EEG  Valproate  I have personally obtained history, examined patient, evaluated and interpreted laboratory and imaging results, reviewed medical records, formulated assessment / plan and placed orders.  CRITICAL CARE:  The patient is critically ill with multiple organ systems failure and requires high complexity decision making for assessment and support, frequent evaluation and titration of therapies, application of advanced monitoring technologies and extensive interpretation of multiple databases. Critical Care Time devoted to patient care services described in this note is 35 minutes.   Doree Fudge, MD Pulmonary and [place] Pager: [phone]  04/07/2014, 8:53 AM

## 2014-04-08 ENCOUNTER — Inpatient Hospital Stay (HOSPITAL_COMMUNITY): Payer: Medicare HMO

## 2014-04-08 DIAGNOSIS — R4182 Altered mental status, unspecified: Secondary | ICD-10-CM

## 2014-04-08 LAB — BASIC METABOLIC PANEL
BUN: 52 mg/dL — ABNORMAL HIGH (ref 6–23)
CHLORIDE: 109 meq/L (ref 96–112)
CO2: 21 meq/L (ref 19–32)
Calcium: 10.8 mg/dL — ABNORMAL HIGH (ref 8.4–10.5)
Creatinine, Ser: 4.71 mg/dL — ABNORMAL HIGH (ref 0.50–1.10)
GFR calc Af Amer: 9 mL/min — ABNORMAL LOW (ref >90)
GFR calc non Af Amer: 8 mL/min — ABNORMAL LOW (ref >90)
Glucose, Bld: 128 mg/dL — ABNORMAL HIGH (ref 70–99)
Potassium: 3.6 mEq/L — ABNORMAL LOW (ref 3.7–5.3)
SODIUM: 144 meq/L (ref 137–147)

## 2014-04-08 LAB — PHOSPHORUS: Phosphorus: 3.8 mg/dL (ref 2.3–4.6)

## 2014-04-08 LAB — CBC
HCT: 26.3 % — ABNORMAL LOW (ref 36.0–46.0)
Hemoglobin: 8.5 g/dL — ABNORMAL LOW (ref 12.0–15.0)
MCH: 27.9 pg (ref 26.0–34.0)
MCHC: 32.3 g/dL (ref 30.0–36.0)
MCV: 86.2 fL (ref 78.0–100.0)
Platelets: 177 10*3/uL (ref 150–400)
RBC: 3.05 MIL/uL — AB (ref 3.87–5.11)
RDW: 17.4 % — ABNORMAL HIGH (ref 11.5–15.5)
WBC: 10 10*3/uL (ref 4.0–10.5)

## 2014-04-08 LAB — GLUCOSE, CAPILLARY
Glucose-Capillary: 102 mg/dL — ABNORMAL HIGH (ref 70–99)
Glucose-Capillary: 102 mg/dL — ABNORMAL HIGH (ref 70–99)
Glucose-Capillary: 106 mg/dL — ABNORMAL HIGH (ref 70–99)
Glucose-Capillary: 107 mg/dL — ABNORMAL HIGH (ref 70–99)
Glucose-Capillary: 138 mg/dL — ABNORMAL HIGH (ref 70–99)
Glucose-Capillary: 142 mg/dL — ABNORMAL HIGH (ref 70–99)

## 2014-04-08 LAB — MAGNESIUM: MAGNESIUM: 2.2 mg/dL (ref 1.5–2.5)

## 2014-04-08 MED ORDER — VITAL AF 1.2 CAL PO LIQD
1000.0000 mL | ORAL | Status: DC
  Administered 2014-04-08 – 2014-04-19 (×9): 1000 mL
  Filled 2014-04-08 (×21): qty 1000

## 2014-04-08 MED ORDER — DEXMEDETOMIDINE HCL IN NACL 400 MCG/100ML IV SOLN: 0.4000 ug/kg/h | INTRAVENOUS | Status: DC

## 2014-04-08 MED ORDER — POTASSIUM CHLORIDE 10 MEQ/50ML IV SOLN
10.0000 meq | INTRAVENOUS | Status: AC
  Administered 2014-04-08 (×2): 10 meq via INTRAVENOUS
  Filled 2014-04-08 (×2): qty 50

## 2014-04-08 MED ORDER — VALPROIC ACID 250 MG/5ML PO SYRP
500.0000 mg | ORAL_SOLUTION | Freq: Three times a day (TID) | ORAL | Status: DC
  Administered 2014-04-08 – 2014-04-14 (×18): 500 mg
  Filled 2014-04-08 (×22): qty 10

## 2014-04-08 NOTE — Progress Notes (Signed)
Occupational Therapy Evaluation Patient Details Name: [name] [name] MRN: [id number] DOB: 1930/03/08 Today's Date: 04/08/2014    History of Present Illness 80 yo female with HTN, DM, pancreatitis, CKD4, vertebral osteomyelitis on IV abx ( recently changed to Cefepime ) admitted 5/16 with AMS.  Evaluated by neurology and continuous EEG revealed status epilepticus.   Clinical Impression   [facility] Unsure of PLOF. Pt currerntly total A + 2 with mobility and ADL. Plan is to D/C back to SNF. All further OT to be addressed at SNF.     Follow Up Recommendations  Supervision/Assistance - 24 hour    Equipment Recommendations  None recommended by OT    Recommendations for Other Services       Precautions / Restrictions Precautions Precautions: Fall      Mobility Bed Mobility Overal bed mobility: +2 for physical assistance             General bed mobility comments: total A with all ADL  Transfers Overall transfer level:  (bedlevel. Nees Geophysicist/field seismologist)                    Balance     total A                                        ADL                                         General ADL Comments: total A with all ADL. NPO     Vision      unable to assess due to lethargy               Perception     Praxis Praxis Praxis tested?: Not tested    Pertinent Vitals/Pain no apparent distress     Hand Dominance     Extremity/Trunk Assessment Upper Extremity Assessment Upper Extremity Assessment: RUE deficits/detail;LUE deficits/detail RUE Deficits / Details: not actively moving. squeeze hands on command  Edematous L hand - dependent edema. B hands elevated on pillows. nsg notified Lower Extremity Assessment Lower Extremity Assessment: Defer to PT evaluation       Communication Communication Communication: Other (comment) (moaning)   Cognition Arousal/Alertness: Lethargic Behavior During  Therapy: Flat affect Overall Cognitive Status: Difficult to assess                     General Comments       Exercises       Shoulder Instructions      Home Living Family/patient expects to be discharged to:: Skilled nursing facility                                 Additional Comments: Adm from [facility] and plans to return      Prior Functioning/Environment Level of Independence: Needs assistance        Comments: assistance with all ADL    OT Diagnosis: Generalized weakness;Cognitive deficits;Disturbance of vision;Paresis;Altered mental status   OT Problem List: Decreased strength;Decreased activity tolerance;Impaired balance (sitting and/or standing);Impaired sensation;Obesity;Impaired tone;Impaired UE functional use;Decreased cognition;Decreased coordination   OT Treatment/Interventions:      OT Goals(Current goals can be found in the care  plan section) Acute Rehab OT Goals Patient Stated Goal: n/a  OT Frequency:     Barriers to D/C:            Co-evaluation              End of Session Nurse Communication: Mobility status  Activity Tolerance: Patient limited by lethargy Patient left: in bed;with call bell/phone within reach   Time: 1237-1251 OT Time Calculation (min): 14 min Charges:  OT General Charges $OT Visit: 1 Procedure OT Evaluation $Initial OT Evaluation Tier I: 1 Procedure G-Codes:    Roney Jaffe Wylene Weissman 04-18-2014, 1:06 PM   [facility], OTR/L  [phone] 18-Apr-2014

## 2014-04-08 NOTE — Progress Notes (Signed)
NEURO HOSPITALIST PROGRESS NOTE   SUBJECTIVE:                                                                                                                        [name] [name] is more responsive today and is able to follow simple commands. Son at the bedside. No further seizures noted.  EEG showed no electrographic seizures but findings suggestive of encephalopathy. On Depakote.  OBJECTIVE:                                                                                                                           Vital signs in last 24 hours: Temp:  [97.9 F (36.6 C)-99 F (37.2 C)] 97.9 F (36.6 C) (05/21 0700) Pulse Rate:  [84-103] 91 (05/21 0900) Resp:  [10-28] 15 (05/21 0900) BP: (133-187)/(45-98) 155/63 mmHg (05/21 0900) SpO2:  [93 %-100 %] 99 % (05/21 0900) Weight:  [91.5 kg (201 lb 11.5 oz)] 91.5 kg (201 lb 11.5 oz) (05/21 0500)  Intake/Output from previous day: 05/20 0701 - 05/21 0700 In: 2065 [I.V.:1800; IV Piggyback:265] Out: 106 [Urine:845] Intake/Output this shift: Total I/O In: 150 [I.V.:150] Out: 50 [Urine:50] Nutritional status: NPO  Past Medical History  Diagnosis Date  . Hypertension   . Gout   . Hypercholesteremia   . Diabetes mellitus   . CHF (congestive heart failure)   . Renal disorder   . Cancer   . Edema   . Chronic diastolic CHF (congestive heart failure), NYHA class 2 12/27/2013  . Hypertensive heart disease 12/28/2013  . Pleural effusion on right 12/13/2013  . HCAP (healthcare-associated pneumonia) 12/22/2013  . Osteomyelitis of thoracic spine 11/29/2013  . Discitis of thoracic region 11/29/2013  . CKD (chronic kidney disease) stage 4, GFR 15-29 ml/min 11/20/2013  . Hyperparathyroidism 01/10/2014  . Generalized weakness 11/30/2013    Neurologic Exam:  MS: opens eyes to verbal commands. CN: intact.  Motor: follows commands on right side, not as brisk with left, wiggles toes bilaterally.  Sensory:no  tested. DTR's: no tested. Coordination and gait: no tested.   Lab Results: Lab Results  Component Value Date/Time   CHOL 238* 04/04/2014  8:00 PM   Lipid Panel No results found for this basename: CHOL, TRIG, HDL,  CHOLHDL, VLDL, LDLCALC,  in the last 72 hours  Studies/Results: No results found.  MEDICATIONS                                                                                                                       I have reviewed the patient's current medications.  ASSESSMENT/PLAN:                                                                                                           80 y/o with cefepime neuro-toxicity and initial EEG findings concerning for NCSE. She is  gradually improving. Continue Depakote for now. Agree that she can be move out of the ICU. Will follow up.  Dorian Pod, MD [facility] [phone]  04/08/2014, 11:32 AM

## 2014-04-08 NOTE — Progress Notes (Signed)
OT Cancellation Note  Patient Details Name: [name] [name] MRN: [id number] DOB: [birth year]   Cancelled Treatment:    Reason Eval/Treat Not Completed: Other (comment) Pt resident of [facility]. Pt is Medicare/Medicaid and current D/C plan is SNF. No apparent immediate acute care OT needs, therefore will defer OT to SNF. If OT eval is needed please call Acute Rehab Dept. at [phone] or text page OT at [phone].  [place], [place]  [phone] 04/08/2014 04/08/2014, 8:49 AM

## 2014-04-08 NOTE — Progress Notes (Signed)
Subjective:   blood pressure mostly up now- did make 850 of urine last 24 hours off lasix  Creatinine essentially stable. Neuro, ID and GI all involved.  Now has been diagnosed with status thought maybe related to cefepime- on depakote/ativan/keppra Objective Vital signs in last 24 hours: Filed Vitals:   04/08/14 0500 04/08/14 0600 04/08/14 0605 04/08/14 0700  BP: 165/65 181/69 176/55 143/47  Pulse: 92 87 90 88  Temp:    97.9 F (36.6 C)  TempSrc:    Axillary  Resp: 11 16 19 12   Height:      Weight: 91.5 kg (201 lb 11.5 oz)     SpO2: 99% 100% 99% 93%   Weight change: 1.3 kg (2 lb 13.9 oz)  Intake/Output Summary (Last 24 hours) at 04/08/14 4627 Last data filed at 04/08/14 0700  Gross per 24 hour  Intake   1990 ml  Output    815 ml  Net   1175 ml    Assessment/ Plan: Pt is a 80 y.o. yo female who was admitted on 03/19/2014 with confusion. She has A on CKD in the setting of being on abx for osteo but also possibly some volume depletion with hypercalcemia  Assessment/Plan: 1. A on CKD- baseline creatinine is in the mid to high ones earlier this year... is making urine-.  No indications for dialysis to date.  i am not sure would be candidate for long term dialysis given everything that is going on with her right now.  Heavy discussions would need to be held with family before I would consider short term dialysis either 2. HTN/vol- status is difficult to determine because originally thought was dry but hypertensive as well- I agree that I would give IVF- changed to d5 1/2 for sodium which was a little high-  Clonidine patch and prns right now- sometimes BP is good- Since cannot take PO's is difficult 3. Anemia- low/stable- have added aranesp and watch for transfusion need 4. ID- hx of osteo- now not on anything 5. Pancreatis - supportive care- numbers seem to be improving 6. Status epilepticus- per neuro- moving to neuro ICU to sedate-keppra/depakote/ativan- still mental status quite poor-  meds/sz/baseline? 7. Elytes- hydration for hypercalcemia-  replacement for hypokalemia 8. Dispo- many severe issues for an 80 year old female- not sure prognosis is good.   Louis Meckel    Labs: Basic Metabolic Panel:  Recent Labs Lab 04/04/14 0513  04/06/14 1800 04/07/14 0420 04/07/14 0740 04/08/14 0420  NA 142  < > 146  --  146 144  K 3.1*  < > 3.6*  --  3.5* 3.6*  CL 106  < > 109  --  110 109  CO2 22  < > 22  --  22 21  GLUCOSE 95  < > 93  --  128* 128*  BUN 45*  < > 49*  --  49* 52*  CREATININE 4.23*  < > 4.79*  --  4.84* 4.71*  CALCIUM 11.0*  < > 10.9*  --  10.9* 10.8*  PHOS 3.0  --   --  4.1  --  3.8  < > = values in this interval not displayed. Liver Function Tests:  Recent Labs Lab 04/05/2014 1023 04/04/14 0513  AST 71*  --   ALT 29  --   ALKPHOS 64  --   BILITOT 0.3  --   PROT 7.0  --   ALBUMIN 2.4* 2.2*    Recent Labs Lab 04/08/2014 1023 04/05/14  0800 04/06/14 0754  LIPASE 1219* 247* 174*  AMYLASE  --   --  113*    Recent Labs Lab 03/31/2014 1855  AMMONIA 21   CBC:  Recent Labs Lab 04/07/2014 1034  04/04/14 0513 04/05/14 0800 04/07/14 0420 04/08/14 0420  WBC 9.3  --  9.7 10.8* 8.9 10.0  NEUTROABS 5.9  --  6.1 5.7  --   --   HGB 9.8*  < > 9.6* 8.9* 8.8* 8.5*  HCT 29.9*  < > 28.5* 26.5* 26.8* 26.3*  MCV 84.7  --  83.8 84.9 85.1 86.2  PLT 203  --  182 149* 159 177  < > = values in this interval not displayed. Cardiac Enzymes:  Recent Labs Lab 04/14/2014 1855 04/04/14 0513 04/04/14 1230 04/05/14 0800  CKTOTAL 584* 621*  --  701*  TROPONINI  --  <0.30 <0.30  --    CBG:  Recent Labs Lab 04/07/14 1225 04/07/14 1645 04/07/14 2004 04/07/14 2341 04/08/14 0323  GLUCAP 112* 124* 102* 96 102*    Iron Studies:  No results found for this basename: IRON, TIBC, TRANSFERRIN, FERRITIN,  in the last 72 hours Studies/Results: No results found. Medications: Infusions: . dextrose 5 % and 0.45% NaCl 75 mL/hr at 04/08/14 0700     Scheduled Medications: . antiseptic oral rinse  15 mL Mouth Rinse q12n4p  . chlorhexidine  15 mL Mouth Rinse BID  . cloNIDine  0.1 mg Transdermal Weekly  . darbepoetin (ARANESP) injection - NON-DIALYSIS  100 mcg Subcutaneous Q Tue-1800  . heparin  5,000 Units Subcutaneous 3 times per day  . insulin aspart  0-15 Units Subcutaneous 6 times per day  . lidocaine  1 patch Transdermal Q24H  . valproate sodium  500 mg Intravenous 3 times per day    have reviewed scheduled and prn medications.  Physical Exam: General: moaning - no purposeful movements-  Heart: tachy Lungs: difficult to examine- lying flat with good O2 sat Abdomen: obese, reacts to palpation Extremities: trace edema    04/08/2014,8:22 AM  LOS: 5 days

## 2014-04-08 NOTE — Progress Notes (Signed)
NUTRITION FOLLOW-UP  DOCUMENTATION CODES Per approved criteria  -Severe malnutrition in the context of acute illness or injury -Obesity Unspecified   INTERVENTION: Initiate Vital AF 1.2 @ 20 ml/hr via NG tube and increase by 10 ml every 12 hours to goal rate of 60 ml/hr.   Tube feeding regimen provides 1728 kcal, 108 grams of protein, and 1167 ml of H2O.   Monitor magnesium, potassium, and phosphorus daily for at least 3 days, MD to replete as needed, as pt is at risk for refeeding syndrome given severe acute malnutrition.  NUTRITION DIAGNOSIS: Inadequate oral intake related to inability to eat as evidenced by NPO status; ongoing.   Goal: Pt to meet >/= 90% of their estimated nutrition needs; not met.    Monitor:  TF initiation/tolerance, weight, labs, I/O's  Reason for Assessment: Low Braden  80 y.o. female  Admitting Dx: Seizures  ASSESSMENT: 80 year old female with PMH of HTN, non-insulin dependent Type II DM, CHF, CKD Stage 4, and osteomyelitis; presented from [place] facility with altered mental status.  MRI head without significant disease.  RD spoke with patient's daughter at bedside; report her Mom wasn't eating well for approximately 3 weeks PTA (at [facility] pt would consume breakfast (eggs, bacon and coffee), however, not eat lunch or dinner; daughter also reports patient has lost 11 lbs during this time frame; per weight readings, pt has had a 14% weight loss since February 2015 (severe for time frame); patient does like Glucerna Shake oral supplement.  Patient meets criteria for severe malnutrition in the context of acute illness of injury as evidenced by < 50% intake of estimated energy requirement for > 5 days and 14% weight loss x 3 months.  Potassium low and being replaced.  Lipase:  1219 5/16 247 5/18 174 5/19  Height: Ht Readings from Last 1 Encounters:  04/14/2014 5' 2"  (1.575 m)    Weight: Wt Readings from Last 1 Encounters:   04/08/14 201 lb 11.5 oz (91.5 kg)  Admission weight 194 lb (88.3 kg) 5/16  BMI:  Body mass index is 36.89 kg/(m^2).  Estimated Nutritional Needs: Kcal: 1600-1800 Protein: 95-110 gm Fluid: >1.5 L/d  Skin: Intact  Diet Order: NPO   Intake/Output Summary (Last 24 hours) at 04/08/14 1029 Last data filed at 04/08/14 0900  Gross per 24 hour  Intake   1940 ml  Output    865 ml  Net   1075 ml    Labs:   Recent Labs Lab 04/06/2014 1855 04/04/14 0513  04/06/14 1800 04/07/14 0420 04/07/14 0740 04/08/14 0420  NA  --  142  < > 146  --  146 144  K  --  3.1*  < > 3.6*  --  3.5* 3.6*  CL  --  106  < > 109  --  110 109  CO2  --  22  < > 22  --  22 21  BUN  --  45*  < > 49*  --  49* 52*  CREATININE  --  4.23*  < > 4.79*  --  4.84* 4.71*  CALCIUM 11.1* 11.0*  < > 10.9*  --  10.9* 10.8*  MG 2.2  --   --   --  2.2  --  2.2  PHOS 3.0 3.0  --   --  4.1  --  3.8  GLUCOSE  --  95  < > 93  --  128* 128*  < > = values in this interval not displayed.  CBG (last 3)   Recent Labs  04/07/14 2341 04/08/14 0323 04/08/14 0739  GLUCAP 96 102* 106*    Scheduled Meds: . antiseptic oral rinse  15 mL Mouth Rinse q12n4p  . chlorhexidine  15 mL Mouth Rinse BID  . cloNIDine  0.1 mg Transdermal Weekly  . darbepoetin (ARANESP) injection - NON-DIALYSIS  100 mcg Subcutaneous Q Tue-1800  . heparin  5,000 Units Subcutaneous 3 times per day  . insulin aspart  0-15 Units Subcutaneous 6 times per day  . lidocaine  1 patch Transdermal Q24H  . potassium chloride  10 mEq Intravenous Q1 Hr x 2  . valproate sodium  500 mg Intravenous 3 times per day    Continuous Infusions: . dextrose 5 % and 0.45% NaCl 75 mL/hr at 04/08/14 0700     [place], [place], [place] Pager [phone] After Hours Pager

## 2014-04-08 NOTE — Progress Notes (Signed)
SLP Cancellation Note  Patient Details Name: RANIKA MCNIEL MRN: [id number] DOB: [birth year]   Cancelled treatment:       Reason Eval/Treat Not Completed: Fatigue/lethargy limiting ability to participate  Gabriel Rainwater [place], CCC-SLP [phone]  EBXI Meryl Itamar Mcgowan 04/08/2014, 3:15 PM

## 2014-04-08 NOTE — Progress Notes (Signed)
PULMONARY / CRITICAL CARE MEDICINE  Name: [name] [name] MRN: [id number] DOB: 1929/11/30    ADMISSION DATE:  03/26/2014 CONSULTATION DATE:  04/05/2014  REFERRING MD :  Leonel Ramsay  PRIMARY SERVICE: PCCM   CHIEF COMPLAINT:  Acute encephalopathy  BRIEF PATIENT DESCRIPTION: 80 yo female with HTN, DM, pancreatitis, CKD4, vertebral osteomyelitis on IV abx ( recently changed to Cefepime ) admitted 5/16 with AMS.  Evaluated by neurology and continuous EEG revealed status epilepticus.  SIGNIFICANT EVENTS / STUDIES:  5/19  EEG >>> diffuse slowing, no overt seizure activity 5/20  EEG >>> diffuse slowing, no overt seizure activity  LINES / TUBES: RUE PICC (pta) >>>  CULTURES: 5/16  Urine  >>> neg  5/16  Blood >>>  ANTIBIOTICS:  INTERVAL HISTORY:  Remains somewhat encephalopathic. No overt seizure activity.  VITAL SIGNS: Temp:  [97.9 F (36.6 C)-99 F (37.2 C)] 97.9 F (36.6 C) (05/21 0700) Pulse Rate:  [87-103] 88 (05/21 0700) Resp:  [10-28] 12 (05/21 0700) BP: (133-197)/(45-98) 187/71 mmHg (05/21 0837) SpO2:  [93 %-100 %] 93 % (05/21 0700) Weight:  [91.5 kg (201 lb 11.5 oz)] 91.5 kg (201 lb 11.5 oz) (05/21 0500)  HEMODYNAMICS:   VENTILATOR SETTINGS:   INTAKE / OUTPUT: Intake/Output     05/20 0701 - 05/21 0700 05/21 0701 - 05/22 0700   I.V. (mL/kg) 1800 (19.7)    IV Piggyback 265    Total Intake(mL/kg) 2065 (22.6)    Urine (mL/kg/hr) 845 (0.4)    Total Output 845     Net +1220           PHYSICAL EXAMINATION: General:  No distress Neuro:  Sleepy, drowsy, slurred speech HEENT:  No JVD Cardiovascular:  Regular, no murmurs Lungs:  Few rhonchi Abdomen:  Soft, bowel sounds present Musculoskeletal:  Warm and dry, no edema  LABS:  CBC  Recent Labs Lab 04/05/14 0800 04/07/14 0420 04/08/14 0420  WBC 10.8* 8.9 10.0  HGB 8.9* 8.8* 8.5*  HCT 26.5* 26.8* 26.3*  PLT 149* 159 177   Coag's  Recent Labs Lab 04/02/2014 1855  APTT 37  INR 1.33   BMET  Recent  Labs Lab 04/06/14 1800 04/07/14 0740 04/08/14 0420  NA 146 146 144  K 3.6* 3.5* 3.6*  CL 109 110 109  CO2 22 22 21   BUN 49* 49* 52*  CREATININE 4.79* 4.84* 4.71*  GLUCOSE 93 128* 128*   Electrolytes  Recent Labs Lab 03/29/2014 1855 04/04/14 0513  04/06/14 1800 04/07/14 0420 04/07/14 0740 04/08/14 0420  CALCIUM 11.1* 11.0*  < > 10.9*  --  10.9* 10.8*  MG 2.2  --   --   --  2.2  --  2.2  PHOS 3.0 3.0  --   --  4.1  --  3.8  < > = values in this interval not displayed. Sepsis Markers  Recent Labs Lab 03/21/2014 1106  LATICACIDVEN 0.64   ABG No results found for this basename: PHART, PCO2ART, PO2ART,  in the last 168 hours  Liver Enzymes  Recent Labs Lab 03/25/2014 1023 04/04/14 0513  AST 71*  --   ALT 29  --   ALKPHOS 64  --   BILITOT 0.3  --   ALBUMIN 2.4* 2.2*   Cardiac Enzymes  Recent Labs Lab 04/04/14 0513 04/04/14 1230  TROPONINI <0.30 <0.30  PROBNP  --  6433.0*   Glucose  Recent Labs Lab 04/07/14 1225 04/07/14 1645 04/07/14 2004 04/07/14 2341 04/08/14 0323 04/08/14 0739  GLUCAP 112* 124* 102*  96 102* 106*   IMAGING:  No results found.  ASSESSMENT / PLAN:  PULMONARY A: Protects airway At risk for respiratory failure / need of ETT P:   Goal SpO2>92 Supplemental oxygen PRN Xopenex PRN  CARDIOVASCULAR A: HTN  Chronic diastolic heart failure P:  Goal BP<160/90 Clonidine patch  Labetalol and Hydralazine PRN   RENAL A: CKD AKI Hypercalcemia Hypokalemia P:   Renal following No indication HD currently , not a great candidate for HD in general Trend BMP, Ca  D5+1/2NS @ 75 K 10 x 2  GASTROINTESTINAL A: Pancreatitis, non biliary Nutrition GIPx is not indicated At risk for dysphagia / aspiration P:   GI following  NPO SLP Place feeding tube and start TF  HEMATOLOGIC A: Anemia VTE Px P:  Trend CBC Heparin Aranesp  INFECTIOUS A: Vertebral osteomyelitis  UTI P:   ID following, deferring abx for  now Cefepime d/c'd for suspected neurotoxicity  ENDOCRINE A: DM   Hypoglycemia P:   CBG/SSI  NEUROLOGIC A: Acute encephalopathy, improving Seizure, suspected Cefepime induced, none since ICU transfer Status epilepticus, resolved P:   Neurology following  Valproate D/c Morphine  Transfer to SDU 5/21.  PCCM will sign off.  IMTS to assume care 5/22.  I have personally obtained history, examined patient, evaluated and interpreted laboratory and imaging results, reviewed medical records, formulated assessment / plan and placed orders.  Doree Fudge, MD Pulmonary and [place] Pager: [phone]  04/08/2014, 8:55 AM

## 2014-04-08 NOTE — Progress Notes (Signed)
INFECTIOUS DISEASE PROGRESS NOTE  ID: [name] [name] is a 80 y.o. female with  Principal Problem:   Seizures Active Problems:   Osteomyelitis of thoracic spine   Acute encephalopathy   Pancreatitis, acute   Acute on chronic renal failure   Acute on chronic kidney failure   Non-convulsive status epilepticus   Protein-calorie malnutrition, severe  Subjective: Was awake and interacting with family this AM.   Abtx:  Anti-infectives   Start     Dose/Rate Route Frequency Ordered Stop   04/04/14 1000  ceFEPIme (MAXIPIME) 1 g in dextrose 5 % 50 mL IVPB  Status:  Discontinued     1 g 100 mL/hr over 30 Minutes Intravenous Every 24 hours 04/10/2014 1821 04/04/14 0931   04/04/14 1000  DAPTOmycin (CUBICIN) 530.5 mg in sodium chloride 0.9 % IVPB  Status:  Discontinued     6 mg/kg  88.4 kg 221.2 mL/hr over 30 Minutes Intravenous Every 24 hours 03/20/2014 1821 04/04/14 0138   03/25/2014 1230  ciprofloxacin (CIPRO) IVPB 400 mg     400 mg 200 mL/hr over 60 Minutes Intravenous  Once 03/23/2014 1225 03/26/2014 1423   04/12/2014 1200  cefTRIAXone (ROCEPHIN) 1 g in dextrose 5 % 50 mL IVPB  Status:  Discontinued     1 g 100 mL/hr over 30 Minutes Intravenous  Once 04/10/2014 1147 04/08/2014 1147   04/05/2014 1200  ceFEPIme (MAXIPIME) 2 g in dextrose 5 % 50 mL IVPB  Status:  Discontinued     2 g 100 mL/hr over 30 Minutes Intravenous Every 12 hours 03/22/2014 1148 03/30/2014 1225      Medications:  Scheduled: . antiseptic oral rinse  15 mL Mouth Rinse q12n4p  . chlorhexidine  15 mL Mouth Rinse BID  . cloNIDine  0.1 mg Transdermal Weekly  . darbepoetin (ARANESP) injection - NON-DIALYSIS  100 mcg Subcutaneous Q Tue-1800  . heparin  5,000 Units Subcutaneous 3 times per day  . insulin aspart  0-15 Units Subcutaneous 6 times per day  . lidocaine  1 patch Transdermal Q24H  . potassium chloride  10 mEq Intravenous Q1 Hr x 2  . valproate sodium  500 mg Intravenous 3 times per day    Objective: Vital signs in last 24  hours: Temp:  [97.9 F (36.6 C)-99 F (37.2 C)] 97.9 F (36.6 C) (05/21 0700) Pulse Rate:  [84-103] 91 (05/21 0900) Resp:  [10-28] 15 (05/21 0900) BP: (133-197)/(45-98) 155/63 mmHg (05/21 0900) SpO2:  [93 %-100 %] 99 % (05/21 0900) Weight:  [91.5 kg (201 lb 11.5 oz)] 91.5 kg (201 lb 11.5 oz) (05/21 0500)   General appearance: resting quietly, no distress Resp: clear to auscultation bilaterally Cardio: regular rate and rhythm GI: normal findings: bowel sounds normal and soft, non-tender  Lab Results  Recent Labs  04/07/14 0420 04/07/14 0740 04/08/14 0420  WBC 8.9  --  10.0  HGB 8.8*  --  8.5*  HCT 26.8*  --  26.3*  NA  --  146 144  K  --  3.5* 3.6*  CL  --  110 109  CO2  --  22 21  BUN  --  49* 52*  CREATININE  --  4.84* 4.71*   Liver Panel No results found for this basename: PROT, ALBUMIN, AST, ALT, ALKPHOS, BILITOT, BILIDIR, IBILI,  in the last 72 hours Sedimentation Rate No results found for this basename: ESRSEDRATE,  in the last 72 hours C-Reactive Protein No results found for this basename: CRP,  in  the last 72 hours  Microbiology: Recent Results (from the past 240 hour(s))  URINE CULTURE     Status: None   Collection Time    04/02/2014 10:53 AM      Result Value Ref Range Status   Specimen Description URINE, RANDOM   Final   Special Requests NONE   Final   Culture  Setup Time     Final   Value: 04/02/2014 19:46     Performed at [facility]     Final   Value: 3,000 COLONIES/ML     Performed at [facility]   Culture     Final   Value: INSIGNIFICANT GROWTH     Performed at [facility]   Report Status 04/05/2014 FINAL   Final  CULTURE, BLOOD (ROUTINE X 2)     Status: None   Collection Time    03/24/2014 10:55 AM      Result Value Ref Range Status   Specimen Description BLOOD PICC LINE   Final   Special Requests BOTTLES DRAWN AEROBIC AND ANAEROBIC 10CC   Final   Culture  Setup Time     Final   Value: 03/21/2014  18:58     Performed at [facility]   Culture     Final   Value:        BLOOD CULTURE RECEIVED NO GROWTH TO DATE CULTURE WILL BE HELD FOR 5 DAYS BEFORE ISSUING A FINAL NEGATIVE REPORT     Performed at [facility]   Report Status PENDING   Incomplete  CULTURE, BLOOD (ROUTINE X 2)     Status: None   Collection Time    04/17/2014 11:00 AM      Result Value Ref Range Status   Specimen Description BLOOD RIGHT HAND   Final   Special Requests BOTTLES DRAWN AEROBIC ONLY 10CC   Final   Culture  Setup Time     Final   Value: 03/26/2014 18:58     Performed at [facility]   Culture     Final   Value:        BLOOD CULTURE RECEIVED NO GROWTH TO DATE CULTURE WILL BE HELD FOR 5 DAYS BEFORE ISSUING A FINAL NEGATIVE REPORT     Performed at [facility]   Report Status PENDING   Incomplete  MRSA PCR SCREENING     Status: None   Collection Time    04/04/14  5:32 PM      Result Value Ref Range Status   MRSA by PCR NEGATIVE  NEGATIVE Final   Comment:            The GeneXpert MRSA Assay (FDA     approved for NASAL specimens     only), is one component of a     comprehensive MRSA colonization     surveillance program. It is not     intended to diagnose MRSA     infection nor to guide or     monitor treatment for     MRSA infections.    Studies/Results: No results found.   Assessment/Plan: Discitis/Osteo  Aspirate January (-) on anbx, vanco/ceftriaxone  Repeat MRI 4-24- worsening of osteo/discitis T10-11. Started on Dapto/cefepime 5-4  Encephalopathy, seizures (due to cefepime)  ARF  Pancreatitis (lipase 1219)  Total days of antibiotics: off   Would continue to watch her off anbx  Await her improvement in neuro fxn til we restart anbx (non-cephalosporin).  Seizure monitoring has been stopped.  BCx negative          Campbell Riches Infectious Diseases (pager) [phone] [email or web address] 04/08/2014, 10:45 AM  LOS: 5 days   **Disclaimer: This  note may have been dictated with voice recognition software. Similar sounding words can inadvertently be transcribed and this note may contain transcription errors which may not have been corrected upon publication of note.**

## 2014-04-09 LAB — CULTURE, BLOOD (ROUTINE X 2)
CULTURE: NO GROWTH
Culture: NO GROWTH

## 2014-04-09 LAB — RENAL FUNCTION PANEL
Albumin: 2 g/dL — ABNORMAL LOW (ref 3.5–5.2)
BUN: 55 mg/dL — AB (ref 6–23)
CALCIUM: 10.7 mg/dL — AB (ref 8.4–10.5)
CO2: 22 mEq/L (ref 19–32)
Chloride: 108 mEq/L (ref 96–112)
Creatinine, Ser: 4.65 mg/dL — ABNORMAL HIGH (ref 0.50–1.10)
GFR calc non Af Amer: 8 mL/min — ABNORMAL LOW (ref >90)
GFR, EST AFRICAN AMERICAN: 9 mL/min — AB (ref >90)
GLUCOSE: 140 mg/dL — AB (ref 70–99)
Phosphorus: 3.3 mg/dL (ref 2.3–4.6)
Potassium: 3.8 mEq/L (ref 3.7–5.3)
Sodium: 143 mEq/L (ref 137–147)

## 2014-04-09 LAB — GLUCOSE, CAPILLARY
GLUCOSE-CAPILLARY: 131 mg/dL — AB (ref 70–99)
GLUCOSE-CAPILLARY: 143 mg/dL — AB (ref 70–99)
GLUCOSE-CAPILLARY: 142 mg/dL — AB (ref 70–99)
GLUCOSE-CAPILLARY: 133 mg/dL — AB (ref 70–99)
Glucose-Capillary: 130 mg/dL — ABNORMAL HIGH (ref 70–99)
Glucose-Capillary: 147 mg/dL — ABNORMAL HIGH (ref 70–99)
Glucose-Capillary: 148 mg/dL — ABNORMAL HIGH (ref 70–99)

## 2014-04-09 LAB — MAGNESIUM: Magnesium: 2.3 mg/dL (ref 1.5–2.5)

## 2014-04-09 MED ORDER — SODIUM CHLORIDE 0.9 % IV SOLN
INTRAVENOUS | Status: DC
  Administered 2014-04-09 – 2014-04-10 (×2): via INTRAVENOUS

## 2014-04-09 NOTE — Progress Notes (Signed)
UR completed. SNF is plan and CSW has it all taken care of already.  Sandi Mariscal, RN BSN [facility] CCM Trauma/Neuro ICU Case Manager [phone]

## 2014-04-09 NOTE — Progress Notes (Signed)
Subjective:   blood pressure mostly up now- did make 760 of urine last 24 hours off lasix  Creatinine essentially stable. Neuro, ID and GI all involved.  Slow improvements- going to be moving out of ICU  Objective Vital signs in last 24 hours: Filed Vitals:   04/09/14 0700 04/09/14 0800 04/09/14 0803 04/09/14 0900  BP: 171/55 174/61  163/42  Pulse: 89 77  91  Temp:   98 F (36.7 C)   TempSrc:   Axillary   Resp: 26 14  16   Height:      Weight:      SpO2: 98% 100%  99%   Weight change: 2.9 kg (6 lb 6.3 oz)  Intake/Output Summary (Last 24 hours) at 04/09/14 1000 Last data filed at 04/09/14 0900  Gross per 24 hour  Intake   2465 ml  Output    730 ml  Net   1735 ml    Assessment/ Plan: Pt is a 80 y.o. yo female who was admitted on 04/04/2014 with confusion. She has A on CKD in the setting of being on abx for osteo but also possibly some volume depletion with hypercalcemia  Assessment/Plan: 1. A on CKD- baseline creatinine is in the mid to high ones earlier this year... is making urine-.  No indications for dialysis to date.  Not a candidate for long term dialysis given everything that is going on with her right now and her poor mental status.  Heavy discussions would need to be held with family before I would consider short term dialysis either 2. HTN/vol- status is difficult to determine because originally thought was dry but hypertensive as well- I agree that I would give IVF- changed to d5 1/2 for sodium which was a little high-  Clonidine patch and prns right now- sometimes BP is good- Since cannot take PO's is difficult 3. Anemia- low/stable- have added aranesp and watch for transfusion need 4. ID- hx of osteo- now not on anything 5. Pancreatis - supportive care- numbers seem to be improving 6. Status epilepticus- per neuro- moving to neuro ICU to sedate-keppra/depakote/ativan- still mental status quite poor- meds/sz/baseline? 7. Elytes- hydration for hypercalcemia-  replacement for  hypokalemia 8. Dispo- many severe issues for an 80 year old female- not sure prognosis is good.   [name] [name]    Labs: Basic Metabolic Panel:  Recent Labs Lab 04/07/14 0420 04/07/14 0740 04/08/14 0420 04/09/14 0420  NA  --  146 144 143  K  --  3.5* 3.6* 3.8  CL  --  110 109 108  CO2  --  22 21 22   GLUCOSE  --  128* 128* 140*  BUN  --  49* 52* 55*  CREATININE  --  4.84* 4.71* 4.65*  CALCIUM  --  10.9* 10.8* 10.7*  PHOS 4.1  --  3.8 3.3   Liver Function Tests:  Recent Labs Lab 04/12/2014 1023 04/04/14 0513 04/09/14 0420  AST 71*  --   --   ALT 29  --   --   ALKPHOS 64  --   --   BILITOT 0.3  --   --   PROT 7.0  --   --   ALBUMIN 2.4* 2.2* 2.0*    Recent Labs Lab 04/09/2014 1023 04/05/14 0800 04/06/14 0754  LIPASE 1219* 247* 174*  AMYLASE  --   --  113*    Recent Labs Lab 04/09/2014 1855  AMMONIA 21   CBC:  Recent Labs Lab 04/16/2014 1034  04/04/14 0513 04/05/14 0800 04/07/14 0420 04/08/14 0420  WBC 9.3  --  9.7 10.8* 8.9 10.0  NEUTROABS 5.9  --  6.1 5.7  --   --   HGB 9.8*  < > 9.6* 8.9* 8.8* 8.5*  HCT 29.9*  < > 28.5* 26.5* 26.8* 26.3*  MCV 84.7  --  83.8 84.9 85.1 86.2  PLT 203  --  182 149* 159 177  < > = values in this interval not displayed. Cardiac Enzymes:  Recent Labs Lab 04/02/2014 1855 04/04/14 0513 04/04/14 1230 04/05/14 0800  CKTOTAL 584* 621*  --  701*  TROPONINI  --  <0.30 <0.30  --    CBG:  Recent Labs Lab 04/08/14 1142 04/08/14 1615 04/08/14 1926 04/08/14 2358 04/09/14 0349  GLUCAP 107* 138* 142* 130* 131*    Iron Studies:  No results found for this basename: IRON, TIBC, TRANSFERRIN, FERRITIN,  in the last 72 hours Studies/Results: Dg Abd Portable 1v  04/08/2014   CLINICAL DATA:  Status post feeding catheter placement  EXAM: PORTABLE ABDOMEN - 1 VIEW  COMPARISON:  None.  FINDINGS: The feeding catheter is now seen with the tip in the distal stomach directed towards the pyloric channel. No acute abnormality  is noted.   Electronically Signed   By: Inez Catalina M.D.   On: 04/08/2014 12:48   Medications: Infusions: . dextrose 5 % and 0.45% NaCl 75 mL/hr at 04/09/14 0600  . feeding supplement (VITAL AF 1.2 CAL) 1,000 mL (04/09/14 0800)    Scheduled Medications: . antiseptic oral rinse  15 mL Mouth Rinse q12n4p  . chlorhexidine  15 mL Mouth Rinse BID  . cloNIDine  0.1 mg Transdermal Weekly  . darbepoetin (ARANESP) injection - NON-DIALYSIS  100 mcg Subcutaneous Q Tue-1800  . heparin  5,000 Units Subcutaneous 3 times per day  . insulin aspart  0-15 Units Subcutaneous 6 times per day  . lidocaine  1 patch Transdermal Q24H  . Valproic Acid  500 mg Per Tube 3 times per day    have reviewed scheduled and prn medications.  Physical Exam: General: moaning - no purposeful movements for me Heart: tachy Lungs: difficult to examine- lying flat with good O2 sat Abdomen: obese, reacts to palpation Extremities: trace edema    04/09/2014,10:00 AM  LOS: 6 days

## 2014-04-09 NOTE — Progress Notes (Signed)
SLP Cancellation Note  Patient Details Name: KENNEDIE PARDOE MRN: [id number] DOB: [birth year]   Cancelled treatment:        Spoke with RN re: appropriateness for BSE.  RN reports pt is poorly arousable at this time.  ST to continue efforts.  TF in place.  Celia B. Quentin Ore [facility], [place] [phone]  Lorre Nick 04/09/2014, 9:34 AM

## 2014-04-09 NOTE — Clinical Social Work Note (Signed)
Clinical Social Worker continuing to follow patient and family for support and discharge planning needs. Patient family plans to have patient return to [facility] once medically stable. CSW spoke with facility who is agreeable with patient return. CSW to submit clinical information to facility for [facility] authorization once patient is close to medical readiness.  Patient now with Panda tube feeds and will not be appropriate for return to SNF until permanent means of feeding (PEG or pass swallow). CSW remains available for support and to facilitate patient discharge needs once medically ready.   Barbette Or, [place]

## 2014-04-09 NOTE — Progress Notes (Signed)
NUTRITION FOLLOW-UP  DOCUMENTATION CODES Per approved criteria  -Severe malnutrition in the context of acute illness or injury -Obesity Unspecified   INTERVENTION: Increase advancement of  Vital AF 1.2  Increase by 10 ml every 4 hours to goal rate of 60 ml/hr.   Tube feeding regimen provides 1728 kcal, 108 grams of protein, and 1167 ml of H2O.   NUTRITION DIAGNOSIS: Inadequate oral intake related to inability to eat as evidenced by NPO status; ongoing.   Goal: Pt to meet >/= 90% of their estimated nutrition needs; not met.    Monitor:  TF initiation/tolerance, weight, labs, I/O's  ASSESSMENT: 80 year old female with PMH of HTN, non-insulin dependent Type II DM, CHF, CKD Stage 4, and osteomyelitis; presented from [facility] facility with altered mental status.  MRI head without significant disease.  RD spoke with patient's daughter at bedside; report her Mom wasn't eating well for approximately 3 weeks PTA (at [facility]); pt would consume breakfast (eggs, bacon and coffee), however, not eat lunch or dinner; daughter also reports patient has lost 11 lbs during this time frame; per weight readings, pt has had a 14% weight loss since February 2015 (severe for time frame); patient does like Glucerna Shake oral supplement.  Patient meets criteria for severe malnutrition in the context of acute illness of injury as evidenced by < 50% intake of estimated energy requirement for > 5 days and 14% weight loss x 3 months.  Potassium low and being replaced.  Lipase:  1219 5/16 247 5/18 174 5/19  Vital AF currently @ 30 ml/hr, potassium, phosphorus, and magnesium are all WNL. Will increase rate of advancement.   Height: Ht Readings from Last 1 Encounters:  04/14/2014 _0  (1.575 m)    Weight: Wt Readings from Last 1 Encounters:  04/09/14 208 lb 1.8 oz (94.4 kg)  Admission weight 194 lb (88.3 kg) 5/16  BMI:  Body mass index is 38.05 kg/(m^2).  Estimated Nutritional  Needs: Kcal: 1600-1800 Protein: 95-110 gm Fluid: >1.5 L/d  Skin: Intact  Diet Order: NPO   Intake/Output Summary (Last 24 hours) at 04/09/14 0948 Last data filed at 04/09/14 0900  Gross per 24 hour  Intake   2540 ml  Output    790 ml  Net   1750 ml    Labs:   Recent Labs Lab 04/07/14 0420 04/07/14 0740 04/08/14 0420 04/09/14 0420  NA  --  146 144 143  K  --  3.5* 3.6* 3.8  CL  --  110 109 108  CO2  --  _1 BUN  --  49* 52* 55*  CREATININE  --  4.84* 4.71* 4.65*  CALCIUM  --  10.9* 10.8* 10.7*  MG 2.2  --  2.2 2.3  PHOS 4.1  --  3.8 3.3  GLUCOSE  --  128* 128* 140*    CBG (last 3)   Recent Labs  04/08/14 1926 04/08/14 2358 04/09/14 0349  GLUCAP 142* 130* 131*    Scheduled Meds: . antiseptic oral rinse  15 mL Mouth Rinse q12n4p  . chlorhexidine  15 mL Mouth Rinse BID  . cloNIDine  0.1 mg Transdermal Weekly  . darbepoetin (ARANESP) injection - NON-DIALYSIS  100 mcg Subcutaneous Q Tue-1800  . heparin  5,000 Units Subcutaneous 3 times per day  . insulin aspart  0-15 Units Subcutaneous 6 times per day  . lidocaine  1 patch Transdermal Q24H  . Valproic Acid  500 mg Per Tube 3 times per day  Continuous Infusions: . dextrose 5 % and 0.45% NaCl 75 mL/hr at 04/09/14 0600  . feeding supplement (VITAL AF 1.2 CAL) 1,000 mL (04/09/14 0800)     [place], [place], [place] Pager [phone] After Hours Pager

## 2014-04-09 NOTE — Progress Notes (Signed)
INFECTIOUS DISEASE PROGRESS NOTE  ID: [name] [name] is a 80 y.o. female with  Principal Problem:   Seizures Active Problems:   Osteomyelitis of thoracic spine   Acute encephalopathy   Pancreatitis, acute   Acute on chronic renal failure   Acute on chronic kidney failure   Non-convulsive status epilepticus   Protein-calorie malnutrition, severe  Subjective: Resting quietly  Abtx:  Anti-infectives   Start     Dose/Rate Route Frequency Ordered Stop   04/04/14 1000  ceFEPIme (MAXIPIME) 1 g in dextrose 5 % 50 mL IVPB  Status:  Discontinued     1 g 100 mL/hr over 30 Minutes Intravenous Every 24 hours 05-02-14 1821 04/04/14 0931   04/04/14 1000  DAPTOmycin (CUBICIN) 530.5 mg in sodium chloride 0.9 % IVPB  Status:  Discontinued     6 mg/kg  88.4 kg 221.2 mL/hr over 30 Minutes Intravenous Every 24 hours 05/02/2014 1821 04/04/14 0138   05-02-2014 1230  ciprofloxacin (CIPRO) IVPB 400 mg     400 mg 200 mL/hr over 60 Minutes Intravenous  Once 2014-05-02 1225 05/02/14 1423   05/02/14 1200  cefTRIAXone (ROCEPHIN) 1 g in dextrose 5 % 50 mL IVPB  Status:  Discontinued     1 g 100 mL/hr over 30 Minutes Intravenous  Once 05/02/14 1147 May 02, 2014 1147   2014-05-02 1200  ceFEPIme (MAXIPIME) 2 g in dextrose 5 % 50 mL IVPB  Status:  Discontinued     2 g 100 mL/hr over 30 Minutes Intravenous Every 12 hours 05/02/2014 1148 05/02/2014 1225      Medications:  Scheduled: . antiseptic oral rinse  15 mL Mouth Rinse q12n4p  . chlorhexidine  15 mL Mouth Rinse BID  . cloNIDine  0.1 mg Transdermal Weekly  . darbepoetin (ARANESP) injection - NON-DIALYSIS  100 mcg Subcutaneous Q Tue-1800  . heparin  5,000 Units Subcutaneous 3 times per day  . insulin aspart  0-15 Units Subcutaneous 6 times per day  . lidocaine  1 patch Transdermal Q24H  . Valproic Acid  500 mg Per Tube 3 times per day    Objective: Vital signs in last 24 hours: Temp:  [97.8 F (36.6 C)-98.5 F (36.9 C)] 98 F (36.7 C) (05/22 0803) Pulse  Rate:  [75-100] 91 (05/22 0900) Resp:  [10-26] 16 (05/22 0900) BP: (135-202)/(42-106) 163/42 mmHg (05/22 0900) SpO2:  [98 %-100 %] 99 % (05/22 0900) Weight:  [94.4 kg (208 lb 1.8 oz)] 94.4 kg (208 lb 1.8 oz) (05/22 0500)   General appearance: no distress Resp: clear to auscultation bilaterally Cardio: regular rate and rhythm GI: normal findings: bowel sounds normal and soft, non-tender  Lab Results  Recent Labs  04/07/14 0420  04/08/14 0420 04/09/14 0420  WBC 8.9  --  10.0  --   HGB 8.8*  --  8.5*  --   HCT 26.8*  --  26.3*  --   NA  --   < > 144 143  K  --   < > 3.6* 3.8  CL  --   < > 109 108  CO2  --   < > 21 22  BUN  --   < > 52* 55*  CREATININE  --   < > 4.71* 4.65*  < > = values in this interval not displayed. Liver Panel  Recent Labs  04/09/14 0420  ALBUMIN 2.0*   Sedimentation Rate No results found for this basename: ESRSEDRATE,  in the last 72 hours C-Reactive Protein No results  found for this basename: CRP,  in the last 72 hours  Microbiology: Recent Results (from the past 240 hour(s))  URINE CULTURE     Status: None   Collection Time    28-Apr-2014 10:53 AM      Result Value Ref Range Status   Specimen Description URINE, RANDOM   Final   Special Requests NONE   Final   Culture  Setup Time     Final   Value: 04/28/14 19:46     Performed at [place]     Final   Value: 3,000 COLONIES/ML     Performed at [facility]   Culture     Final   Value: INSIGNIFICANT GROWTH     Performed at [facility]   Report Status 04/05/2014 FINAL   Final  CULTURE, BLOOD (ROUTINE X 2)     Status: None   Collection Time    04-28-2014 10:55 AM      Result Value Ref Range Status   Specimen Description BLOOD PICC LINE   Final   Special Requests BOTTLES DRAWN AEROBIC AND ANAEROBIC 10CC   Final   Culture  Setup Time     Final   Value: Apr 28, 2014 18:58     Performed at [facility]   Culture     Final   Value: NO GROWTH 5  DAYS     Performed at [facility]   Report Status 04/09/2014 FINAL   Final  CULTURE, BLOOD (ROUTINE X 2)     Status: None   Collection Time    04-28-14 11:00 AM      Result Value Ref Range Status   Specimen Description BLOOD RIGHT HAND   Final   Special Requests BOTTLES DRAWN AEROBIC ONLY 10CC   Final   Culture  Setup Time     Final   Value: 2014/04/28 18:58     Performed at [facility]   Culture     Final   Value: NO GROWTH 5 DAYS     Performed at [facility]   Report Status 04/09/2014 FINAL   Final  MRSA PCR SCREENING     Status: None   Collection Time    04/04/14  5:32 PM      Result Value Ref Range Status   MRSA by PCR NEGATIVE  NEGATIVE Final   Comment:            The GeneXpert MRSA Assay (FDA     approved for NASAL specimens     only), is one component of a     comprehensive MRSA colonization     surveillance program. It is not     intended to diagnose MRSA     infection nor to guide or     monitor treatment for     MRSA infections.    Studies/Results: Dg Abd Portable 1v  04/08/2014   CLINICAL DATA:  Status post feeding catheter placement  EXAM: PORTABLE ABDOMEN - 1 VIEW  COMPARISON:  None.  FINDINGS: The feeding catheter is now seen with the tip in the distal stomach directed towards the pyloric channel. No acute abnormality is noted.   Electronically Signed   By: Inez Catalina M.D.   On: 04/08/2014 12:48     Assessment/Plan: Discitis/Osteo  Aspirate January (-) on anbx, vanco/ceftriaxone  Repeat MRI 4-24- worsening of osteo/discitis T10-11. Started on Dapto/cefepime 5-4  Encephalopathy, seizures (due to cefepime)  ARF  Pancreatitis (lipase 1219)  Total days of antibiotics: off   Out of ICU today.  Continue to hold anbx F/u amylase, lipase I am available over the w/e if questions         [facility] (pager) [phone] [email or web address] 04/09/2014, 11:02 AM  LOS: 6 days   **Disclaimer: This note  may have been dictated with voice recognition software. Similar sounding words can inadvertently be transcribed and this note may contain transcription errors which may not have been corrected upon publication of note.**

## 2014-04-09 NOTE — Progress Notes (Addendum)
TRANSFER NOTE  Subjective: Patient says good morning but does not follow command when asked to open eyes  RN: no new events. Still will not really follow commands   Objective: Vital signs in last 24 hours: Filed Vitals:   04/09/14 1500 04/09/14 1600 04/09/14 1704 04/09/14 1949  BP: 147/68 161/62 138/51 172/71  Pulse: 95 96 95 94  Temp:  97.2 F (36.2 C) 97 F (36.1 C) 98.2 F (36.8 C)  TempSrc:  Oral Axillary Axillary  Resp: 18 17 14 10   Height:      Weight:   195 lb 12.3 oz (88.8 kg)   SpO2: 99% 98% 100% 100%   Weight change: 6 lb 6.3 oz (2.9 kg)  Intake/Output Summary (Last 24 hours) at 04/09/14 2025 Last data filed at 04/09/14 1927  Gross per 24 hour  Intake   2260 ml  Output    645 ml  Net   1615 ml   Vitals reviewed. General: resting in bed, NAD HEENT: [place]/at, no scleral icterus, feeding tube in nose  Cardiac: RRR, no rubs, murmurs or gallops Pulm: clear to auscultation bilaterally, no wheezes, rales, or rhonchi Abd: soft, nontender, nondistended, BS present Ext: warm and well perfused, trace edema  Neuro: eyes closed but intermittently opening, will withdraw to pain in 3/4 extremities (left upper ext pt will not move to pain), pt unable to follow commands   Lab Results: Basic Metabolic Panel:  Recent Labs Lab 04/08/14 0420 04/09/14 0420  NA 144 143  K 3.6* 3.8  CL 109 108  CO2 21 22  GLUCOSE 128* 140*  BUN 52* 55*  CREATININE 4.71* 4.65*  CALCIUM 10.8* 10.7*  MG 2.2 2.3  PHOS 3.8 3.3   Liver Function Tests:  Recent Labs Lab 04/16/2014 1023 04/04/14 0513 04/09/14 0420  AST 71*  --   --   ALT 29  --   --   ALKPHOS 64  --   --   BILITOT 0.3  --   --   PROT 7.0  --   --   ALBUMIN 2.4* 2.2* 2.0*    Recent Labs Lab 04/05/14 0800 04/06/14 0754  LIPASE 247* 174*  AMYLASE  --  113*    Recent Labs Lab 04/09/2014 1855  AMMONIA 21   CBC:  Recent Labs Lab 04/04/14 0513 04/05/14 0800 04/07/14 0420 04/08/14 0420  WBC 9.7 10.8* 8.9  10.0  NEUTROABS 6.1 5.7  --   --   HGB 9.6* 8.9* 8.8* 8.5*  HCT 28.5* 26.5* 26.8* 26.3*  MCV 83.8 84.9 85.1 86.2  PLT 182 149* 159 177   Cardiac Enzymes:  Recent Labs Lab 03/30/2014 1855 04/04/14 0513 04/04/14 1230 04/05/14 0800  CKTOTAL 584* 621*  --  701*  TROPONINI  --  <0.30 <0.30  --    BNP:  Recent Labs Lab 04/04/14 1230  PROBNP 6433.0*   D-Dimer: No results found for this basename: DDIMER,  in the last 168 hours CBG:  Recent Labs Lab 04/08/14 2358 04/09/14 0349 04/09/14 0800 04/09/14 1155 04/09/14 1608 04/09/14 1714  GLUCAP 130* 131* 147* 143* 142* 133*   Hemoglobin A1C:  Recent Labs Lab 03/24/2014 1855  HGBA1C 6.0*   Fasting Lipid Panel:  Recent Labs Lab 03/28/2014 2000  CHOL 238*  HDL 56  LDLCALC 167*  TRIG 77  CHOLHDL 4.3   Thyroid Function Tests:  Recent Labs Lab 03/21/2014 1855  TSH 1.050   Coagulation:  Recent Labs Lab 03/23/2014 1855  LABPROT 16.2*  INR 1.33  Anemia Panel:  Recent Labs Lab 04/04/14 0513  VITAMINB12 815  FOLATE >20.0  FERRITIN 253  TIBC 135*  IRON 33*  RETICCTPCT 2.1   Urinalysis:  Recent Labs Lab 04/16/2014 1053  COLORURINE YELLOW  LABSPEC 1.014  PHURINE 6.5  GLUCOSEU NEGATIVE  HGBUR MODERATE*  BILIRUBINUR NEGATIVE  KETONESUR 15*  PROTEINUR 100*  UROBILINOGEN 0.2  NITRITE NEGATIVE  LEUKOCYTESUR LARGE*   Misc. Labs: None   Micro Results: Recent Results (from the past 240 hour(s))  URINE CULTURE     Status: None   Collection Time    2014/04/16 10:53 AM      Result Value Ref Range Status   Specimen Description URINE, RANDOM   Final   Special Requests NONE   Final   Culture  Setup Time     Final   Value: 04-16-14 19:46     Performed at [place]     Final   Value: 3,000 COLONIES/ML     Performed at [facility]   Culture     Final   Value: INSIGNIFICANT GROWTH     Performed at [facility]   Report Status 04/05/2014 FINAL   Final  CULTURE,  BLOOD (ROUTINE X 2)     Status: None   Collection Time    04-16-2014 10:55 AM      Result Value Ref Range Status   Specimen Description BLOOD PICC LINE   Final   Special Requests BOTTLES DRAWN AEROBIC AND ANAEROBIC 10CC   Final   Culture  Setup Time     Final   Value: Apr 16, 2014 18:58     Performed at [facility]   Culture     Final   Value: NO GROWTH 5 DAYS     Performed at [facility]   Report Status 04/09/2014 FINAL   Final  CULTURE, BLOOD (ROUTINE X 2)     Status: None   Collection Time    04/16/14 11:00 AM      Result Value Ref Range Status   Specimen Description BLOOD RIGHT HAND   Final   Special Requests BOTTLES DRAWN AEROBIC ONLY 10CC   Final   Culture  Setup Time     Final   Value: 2014-04-16 18:58     Performed at [facility]   Culture     Final   Value: NO GROWTH 5 DAYS     Performed at [facility]   Report Status 04/09/2014 FINAL   Final  MRSA PCR SCREENING     Status: None   Collection Time    04/04/14  5:32 PM      Result Value Ref Range Status   MRSA by PCR NEGATIVE  NEGATIVE Final   Comment:            The GeneXpert MRSA Assay (FDA     approved for NASAL specimens     only), is one component of a     comprehensive MRSA colonization     surveillance program. It is not     intended to diagnose MRSA     infection nor to guide or     monitor treatment for     MRSA infections.   Studies/Results: Dg Abd Portable 1v  04/08/2014   CLINICAL DATA:  Status post feeding catheter placement  EXAM: PORTABLE ABDOMEN - 1 VIEW  COMPARISON:  None.  FINDINGS: The feeding catheter is now seen with the tip in  the distal stomach directed towards the pyloric channel. No acute abnormality is noted.   Electronically Signed   By: Inez Catalina M.D.   On: 04/08/2014 12:48   Medications:  Scheduled Meds: . antiseptic oral rinse  15 mL Mouth Rinse q12n4p  . chlorhexidine  15 mL Mouth Rinse BID  . cloNIDine  0.1 mg Transdermal Weekly  . darbepoetin  (ARANESP) injection - NON-DIALYSIS  100 mcg Subcutaneous Q Tue-1800  . heparin  5,000 Units Subcutaneous 3 times per day  . insulin aspart  0-15 Units Subcutaneous 6 times per day  . lidocaine  1 patch Transdermal Q24H  . Valproic Acid  500 mg Per Tube 3 times per day   Continuous Infusions: . dextrose 5 % and 0.45% NaCl 1,000 mL (04/09/14 1859)  . feeding supplement (VITAL AF 1.2 CAL) 1,000 mL (04/09/14 0800)   PRN Meds:.hydrALAZINE, labetalol, levalbuterol, sodium chloride Assessment/Plan: 80 y.o PMH OM, HTN, DM 2, chronic diastolic CHF, CKD, cancer, HTN heart disease. She was admitted on 03/30/2014 with acute encephalopathy, acute on chronic CKD, seizures noted likely 2/2 Cefepime toxicity and pancreatitis. She was transferred to the ICU on 5/18 for further care   #Cefepime toxicity  -likely causing seizures and acute encephalopathy  #Seizures likely 2/2 Cefepime  -Neuro following. Noted to have nonconvulsive status epilepticus on 5/18  -Continue Depakote   #Acute encephalopathy -likely 2/2 Cefepime   #Acute on chronic kidney failure  -creatinine was 1.78 on 4/29 with BL Cr. 1.7-1.9. Today Cr is 4.65  -renal following. Want disc with family b/f consider short term HD -am RFP  #Pancreatitis, acute -supportive care  -f/u amylase and lipase (1219) trended down to 174  -GI consulted this admission   #Osteomyelitis of thoracic spine (T10/T11) -ID following. Previous aspirate in 11/2013 negative on Abx but repeat MRI 02/2014 with worsening bony destruction and she was started on Dapto/Cefepime -will continue to hold abx but consider non-cephalosporin Abx with neuro fxn improves   #Chronic diastolic heart failure (stable) -echo 11/2013 with EF 60-65% with grade 1 diastolic dysfucntion -strict i/o, daily weights   #Anemia  -Aranesp per renal  -trend CBC  #HTN -Clonidine 0.1 patch q week, Hydralazine prn, prn labetalol   #Protein-calorie malnutrition, severe  #F/E/N -D51/2 NS  75 cc/hr changed to NS 75 cc/hr  -renal function panel in the am  -Hypercalcemia Ca 10.7.  M spike neg on 5/16 SPEP -NPO getting TF with SSI-M  #DVT px  -heparin   Dispo: unknown at this time   The patient does have a current PCP Isaias Cowman PCP and does need an [facility] hospital follow-up appointment after discharge.  The patient does not have transportation limitations that hinder transportation to clinic appointments.  .Services Needed at time of discharge: Y = Yes, Blank = No PT:   OT:   RN:   Equipment:   Other:     LOS: 6 days   Cresenciano Genre, MD [phone] 04/09/2014, 8:25 PM

## 2014-04-10 DIAGNOSIS — D638 Anemia in other chronic diseases classified elsewhere: Secondary | ICD-10-CM | POA: Diagnosis present

## 2014-04-10 LAB — CBC WITH DIFFERENTIAL/PLATELET
BASOS PCT: 0 % (ref 0–1)
Basophils Absolute: 0 10*3/uL (ref 0.0–0.1)
EOS ABS: 0.1 10*3/uL (ref 0.0–0.7)
Eosinophils Relative: 1 % (ref 0–5)
HCT: 22.3 % — ABNORMAL LOW (ref 36.0–46.0)
Hemoglobin: 7.5 g/dL — ABNORMAL LOW (ref 12.0–15.0)
Lymphocytes Relative: 10 % — ABNORMAL LOW (ref 12–46)
Lymphs Abs: 1.1 10*3/uL (ref 0.7–4.0)
MCH: 28.4 pg (ref 26.0–34.0)
MCHC: 33.6 g/dL (ref 30.0–36.0)
MCV: 84.5 fL (ref 78.0–100.0)
Monocytes Absolute: 1.7 10*3/uL — ABNORMAL HIGH (ref 0.1–1.0)
Monocytes Relative: 14 % — ABNORMAL HIGH (ref 3–12)
NEUTROS ABS: 8.8 10*3/uL — AB (ref 1.7–7.7)
NEUTROS PCT: 75 % (ref 43–77)
Platelets: 194 10*3/uL (ref 150–400)
RBC: 2.64 MIL/uL — ABNORMAL LOW (ref 3.87–5.11)
RDW: 16.9 % — AB (ref 11.5–15.5)
WBC: 11.6 10*3/uL — ABNORMAL HIGH (ref 4.0–10.5)

## 2014-04-10 LAB — RENAL FUNCTION PANEL
Albumin: 1.8 g/dL — ABNORMAL LOW (ref 3.5–5.2)
BUN: 59 mg/dL — ABNORMAL HIGH (ref 6–23)
CALCIUM: 10.4 mg/dL (ref 8.4–10.5)
CO2: 20 mEq/L (ref 19–32)
Chloride: 109 mEq/L (ref 96–112)
Creatinine, Ser: 4.59 mg/dL — ABNORMAL HIGH (ref 0.50–1.10)
GFR, EST AFRICAN AMERICAN: 9 mL/min — AB (ref >90)
GFR, EST NON AFRICAN AMERICAN: 8 mL/min — AB (ref >90)
Glucose, Bld: 175 mg/dL — ABNORMAL HIGH (ref 70–99)
POTASSIUM: 4 meq/L (ref 3.7–5.3)
Phosphorus: 2.6 mg/dL (ref 2.3–4.6)
SODIUM: 144 meq/L (ref 137–147)

## 2014-04-10 LAB — CBC
HCT: 21 % — ABNORMAL LOW (ref 36.0–46.0)
HEMOGLOBIN: 7.1 g/dL — AB (ref 12.0–15.0)
MCH: 28.9 pg (ref 26.0–34.0)
MCHC: 33.8 g/dL (ref 30.0–36.0)
MCV: 85.4 fL (ref 78.0–100.0)
PLATELETS: 178 10*3/uL (ref 150–400)
RBC: 2.46 MIL/uL — ABNORMAL LOW (ref 3.87–5.11)
RDW: 16.9 % — ABNORMAL HIGH (ref 11.5–15.5)
WBC: 10 10*3/uL (ref 4.0–10.5)

## 2014-04-10 LAB — GLUCOSE, CAPILLARY
GLUCOSE-CAPILLARY: 153 mg/dL — AB (ref 70–99)
Glucose-Capillary: 147 mg/dL — ABNORMAL HIGH (ref 70–99)
Glucose-Capillary: 158 mg/dL — ABNORMAL HIGH (ref 70–99)
Glucose-Capillary: 193 mg/dL — ABNORMAL HIGH (ref 70–99)
Glucose-Capillary: 155 mg/dL — ABNORMAL HIGH (ref 70–99)
Glucose-Capillary: 149 mg/dL — ABNORMAL HIGH (ref 70–99)

## 2014-04-10 LAB — URINALYSIS, ROUTINE W REFLEX MICROSCOPIC
BILIRUBIN URINE: NEGATIVE
Glucose, UA: NEGATIVE mg/dL
KETONES UR: NEGATIVE mg/dL
NITRITE: NEGATIVE
PH: 5.5 (ref 5.0–8.0)
Protein, ur: 100 mg/dL — AB
SPECIFIC GRAVITY, URINE: 1.018 (ref 1.005–1.030)
Urobilinogen, UA: 0.2 mg/dL (ref 0.0–1.0)

## 2014-04-10 LAB — TECHNOLOGIST SMEAR REVIEW

## 2014-04-10 LAB — DIRECT ANTIGLOBULIN TEST (NOT AT ARMC)
DAT, IgG: POSITIVE
DAT, complement: NEGATIVE

## 2014-04-10 LAB — URINE MICROSCOPIC-ADD ON

## 2014-04-10 LAB — CK: CK TOTAL: 26 U/L (ref 7–177)

## 2014-04-10 LAB — LACTATE DEHYDROGENASE: LDH: 261 U/L — ABNORMAL HIGH (ref 94–250)

## 2014-04-10 MED ORDER — HYDRALAZINE HCL 25 MG PO TABS
25.0000 mg | ORAL_TABLET | Freq: Three times a day (TID) | ORAL | Status: DC
  Administered 2014-04-10 – 2014-04-19 (×27): 25 mg via ORAL
  Filled 2014-04-10 (×31): qty 1

## 2014-04-10 NOTE — Progress Notes (Signed)
SLP Cancellation Note  Patient Details Name: [name] [name] MRN: [id number] DOB: [birth year]   Cancelled treatment:       Reason Eval/Treat Not Completed: Fatigue/lethargy limiting ability to participate.  Pt not sufficiently arousable for participation.  Will continue efforts.   Frederich Balding [name] [name] 04/10/2014, 12:27 PM

## 2014-04-10 NOTE — Progress Notes (Signed)
Subjective:   blood pressure mostly up now- did make 575 of urine last 24 hours off lasix- urine looking cloudy.   Creatinine essentially stable. Neuro, ID and GI all involved.  Slow improvements ?- going to be moving out of ICU  Objective Vital signs in last 24 hours: Filed Vitals:   04/09/14 1949 04/09/14 2355 04/10/14 0449 04/10/14 0821  BP: 172/71 158/58 171/66 160/62  Pulse: 94 94 90 85  Temp: 98.2 F (36.8 C) 97.8 F (36.6 C) 98.1 F (36.7 C) 97.7 F (36.5 C)  TempSrc: Axillary Axillary Axillary Axillary  Resp: 10 18  12   Height:      Weight:      SpO2: 100% 100% 100% 100%   Weight change: -5.6 kg (-12 lb 5.5 oz)  Intake/Output Summary (Last 24 hours) at 04/10/14 0914 Last data filed at 04/10/14 0800  Gross per 24 hour  Intake 1707.5 ml  Output    500 ml  Net 1207.5 ml    Assessment/ Plan: Pt is a 80 y.o. yo female who was admitted on 03/25/2014 with confusion. She has A on CKD in the setting of being on abx for osteo but also possibly some volume depletion with hypercalcemia  Assessment/Plan: 1. A on CKD- baseline creatinine is in the mid to high ones earlier this year... is making urine-.  No indications for dialysis to date.  Not a candidate for long term dialysis given everything that is going on with her right now and her poor mental status.  Heavy discussions would need to be held with family before I would consider short term dialysis either.  Renal function not getting better but not getting worse either 2. HTN/vol- status is difficult to determine because originally thought was dry but hypertensive as well- Clonidine patch and prns right now- sometimes BP is good- Will add scheduled hydralazine 3. Anemia- low/stable- have added aranesp and watch for transfusion need 4. ID- hx of osteo- now not on anything- urine looks cloudy, will check U/A   5. Pancreatis - supportive care- numbers seem to be improving 6. Status epilepticus- per neuro- moving to neuro ICU to  sedate-keppra/depakote/ativan- still mental status quite poor- meds/sz/baseline? 7. Elytes- hydration for hypercalcemia-  replacement for hypokalemia 8. Dispo- many severe issues for an 80 year old female- not sure prognosis is good. Looking at PEG/SNF- not currently a candidate for dialysis unless neurologically improves a lot  [facility]    Labs: Basic Metabolic Panel:  Recent Labs Lab 04/08/14 0420 04/09/14 0420 04/10/14 0500  NA 144 143 144  K 3.6* 3.8 4.0  CL 109 108 109  CO2 21 22 20   GLUCOSE 128* 140* 175*  BUN 52* 55* 59*  CREATININE 4.71* 4.65* 4.59*  CALCIUM 10.8* 10.7* 10.4  PHOS 3.8 3.3 2.6   Liver Function Tests:  Recent Labs Lab 04/12/2014 1023 04/04/14 0513 04/09/14 0420 04/10/14 0500  AST 71*  --   --   --   ALT 29  --   --   --   ALKPHOS 64  --   --   --   BILITOT 0.3  --   --   --   PROT 7.0  --   --   --   ALBUMIN 2.4* 2.2* 2.0* 1.8*    Recent Labs Lab 03/30/2014 1023 04/05/14 0800 04/06/14 0754  LIPASE 1219* 247* 174*  AMYLASE  --   --  113*    Recent Labs Lab 03/31/2014 1855  AMMONIA 21  CBC:  Recent Labs Lab 04/04/14 0513 04/05/14 0800 04/07/14 0420 04/08/14 0420 04/10/14 0500 04/10/14 0846  WBC 9.7 10.8* 8.9 10.0 11.6* 10.0  NEUTROABS 6.1 5.7  --   --  8.8*  --   HGB 9.6* 8.9* 8.8* 8.5* 7.5* 7.1*  HCT 28.5* 26.5* 26.8* 26.3* 22.3* 21.0*  MCV 83.8 84.9 85.1 86.2 84.5 85.4  PLT 182 149* 159 177 194 178   Cardiac Enzymes:  Recent Labs Lab 04/02/2014 1855 04/04/14 0513 04/04/14 1230 04/05/14 0800 04/10/14 0500  CKTOTAL 584* 621*  --  701* 26  TROPONINI  --  <0.30 <0.30  --   --    CBG:  Recent Labs Lab 04/09/14 1714 04/09/14 1944 04/10/14 04/10/14 0408 04/10/14 0820  GLUCAP 133* 148* 147* 158* 193*    Iron Studies:  No results found for this basename: IRON, TIBC, TRANSFERRIN, FERRITIN,  in the last 72 hours Studies/Results: Dg Abd Portable 1v  04/08/2014   CLINICAL DATA:  Status post feeding  catheter placement  EXAM: PORTABLE ABDOMEN - 1 VIEW  COMPARISON:  None.  FINDINGS: The feeding catheter is now seen with the tip in the distal stomach directed towards the pyloric channel. No acute abnormality is noted.   Electronically Signed   By: Inez Catalina M.D.   On: 04/08/2014 12:48   Medications: Infusions: . sodium chloride 75 mL/hr at 04/09/14 2122  . feeding supplement (VITAL AF 1.2 CAL) 1,000 mL (04/09/14 0800)    Scheduled Medications: . antiseptic oral rinse  15 mL Mouth Rinse q12n4p  . chlorhexidine  15 mL Mouth Rinse BID  . cloNIDine  0.1 mg Transdermal Weekly  . darbepoetin (ARANESP) injection - NON-DIALYSIS  100 mcg Subcutaneous Q Tue-1800  . heparin  5,000 Units Subcutaneous 3 times per day  . insulin aspart  0-15 Units Subcutaneous 6 times per day  . lidocaine  1 patch Transdermal Q24H  . Valproic Acid  500 mg Per Tube 3 times per day    have reviewed scheduled and prn medications.  Physical Exam: General: moaning - no purposeful movements for me- no interaction Heart: tachy Lungs: difficult to examine- lying flat with good O2 sat Abdomen: obese, reacts to palpation Extremities: trace edema    04/10/2014,9:14 AM  LOS: 7 days

## 2014-04-10 NOTE — Progress Notes (Signed)
Internal Medicine Attending  Date: 04/10/2014  Patient name: [name] [name] Medical record number: [id number] Date of birth: 1930/02/02 Age: 80 y.o. Gender: female  I saw and evaluated the patient. I discussed patient and reviewed the resident's note by Dr. Aundra Dubin, and I agree with the resident's findings and plans as documented in her note, with the following additional comments.  Patient is an 80 year old woman with history of vertebral osteomyelitis admitted with altered mental status and concern for nonconvulsive status epilepticus attributed to cefepime neurotoxicity, pancreatitis, and acute renal failure, transferred back to the internal medicine service 5/22 from the critical care service.  Patient is somnolent, mumbles a few words but does not answer questions; lungs are clear anteriorly; bowel sounds present, soft abdomen, no guarding; trace bilateral leg edema.   Labs are notable for creatinine 4.59 and hemoglobin 7.1.  Plans include monitor creatinine, with nephrology following; supportive care and follow for further improvement in mental status; continue valproic acid as per neurology; supportive care for acute pancreatitis; antibiotics are currently on hold as per ID consult.  Would evaluate anemia with anemia panel, reticulocyte count, haptoglobin, LDH, and technical smear; follow stool Hemoccults for any signs of GI bleeding; if hemoglobin drops further, she may need transfusion.  Other problems and plans as per the resident physician's note.

## 2014-04-10 NOTE — Progress Notes (Signed)
Subjective: Patient is asleep in bed, not following commands some mumbling.    Objective: Vital signs in last 24 hours: Filed Vitals:   04/09/14 1704 04/09/14 1949 04/09/14 2355 04/10/14 0449  BP: 138/51 172/71 158/58 171/66  Pulse: 95 94 94 90  Temp: 97 F (36.1 C) 98.2 F (36.8 C) 97.8 F (36.6 C) 98.1 F (36.7 C)  TempSrc: Axillary Axillary Axillary Axillary  Resp: 14 10 18    Height:      Weight: 195 lb 12.3 oz (88.8 kg)     SpO2: 100% 100% 100% 100%   Weight change: -12 lb 5.5 oz (-5.6 kg)  Intake/Output Summary (Last 24 hours) at 04/10/14 0716 Last data filed at 04/09/14 1927  Gross per 24 hour  Intake   1180 ml  Output    300 ml  Net    880 ml   Vitals reviewed. General: resting in bed, NAD HEENT: Venturia/at, no scleral icterus, feeding tube in nose  Cardiac: RRR to sl tachycardia, no rubs, murmurs or gallops Pulm: clear to auscultation bilaterally, no wheezes, rales, or rhonchi Abd: soft, nontender, nondistended, BS present Ext: warm and well perfused, trace edema arms and legs   Neuro: eyes closed, will withdraw to pain in 4/4 extremities, pt unable to follow commands, mumbling intermittently incoherent words   Lab Results: Basic Metabolic Panel:  Recent Labs Lab 04/08/14 0420 04/09/14 0420 04/10/14 0500  NA 144 143 144  K 3.6* 3.8 4.0  CL 109 108 109  CO2 21 22 20   GLUCOSE 128* 140* 175*  BUN 52* 55* 59*  CREATININE 4.71* 4.65* 4.59*  CALCIUM 10.8* 10.7* 10.4  MG 2.2 2.3  --   PHOS 3.8 3.3 2.6   Liver Function Tests:  Recent Labs Lab 04/08/2014 1023  04/09/14 0420 04/10/14 0500  AST 71*  --   --   --   ALT 29  --   --   --   ALKPHOS 64  --   --   --   BILITOT 0.3  --   --   --   PROT 7.0  --   --   --   ALBUMIN 2.4*  < > 2.0* 1.8*  < > = values in this interval not displayed.  Recent Labs Lab 04/05/14 0800 04/06/14 0754  LIPASE 247* 174*  AMYLASE  --  113*    Recent Labs Lab 04/18/2014 1855  AMMONIA 21   CBC:  Recent Labs Lab  04/05/14 0800  04/08/14 0420 04/10/14 0500  WBC 10.8*  < > 10.0 11.6*  NEUTROABS 5.7  --   --  8.8*  HGB 8.9*  < > 8.5* 7.5*  HCT 26.5*  < > 26.3* 22.3*  MCV 84.9  < > 86.2 84.5  PLT 149*  < > 177 194  < > = values in this interval not displayed. Cardiac Enzymes:  Recent Labs Lab 04/04/14 0513 04/04/14 1230 04/05/14 0800 04/10/14 0500  CKTOTAL 621*  --  701* 26  TROPONINI <0.30 <0.30  --   --    BNP:  Recent Labs Lab 04/04/14 1230  PROBNP 6433.0*   CBG:  Recent Labs Lab 04/09/14 1155 04/09/14 1608 04/09/14 1714 04/09/14 1944 04/10/14 04/10/14 0408  GLUCAP 143* 142* 133* 148* 147* 158*   Hemoglobin A1C:  Recent Labs Lab 03/31/2014 1855  HGBA1C 6.0*   Fasting Lipid Panel:  Recent Labs Lab 03/23/2014 2000  CHOL 238*  HDL 56  LDLCALC 167*  TRIG 77  CHOLHDL  4.3   Thyroid Function Tests:  Recent Labs Lab 04/11/2014 1855  TSH 1.050   Coagulation:  Recent Labs Lab 03/29/2014 1855  LABPROT 16.2*  INR 1.33   Anemia Panel:  Recent Labs Lab 04/04/14 0513  VITAMINB12 815  FOLATE >20.0  FERRITIN 253  TIBC 135*  IRON 33*  RETICCTPCT 2.1   Urinalysis:  Recent Labs Lab 04/12/2014 1053  COLORURINE YELLOW  LABSPEC 1.014  PHURINE 6.5  GLUCOSEU NEGATIVE  HGBUR MODERATE*  BILIRUBINUR NEGATIVE  KETONESUR 15*  PROTEINUR 100*  UROBILINOGEN 0.2  NITRITE NEGATIVE  LEUKOCYTESUR LARGE*   Misc. Labs: None   Micro Results: Recent Results (from the past 240 hour(s))  URINE CULTURE     Status: None   Collection Time    03/28/2014 10:53 AM      Result Value Ref Range Status   Specimen Description URINE, RANDOM   Final   Special Requests NONE   Final   Culture  Setup Time     Final   Value: 03/24/2014 19:46     Performed at [facility]     Final   Value: 3,000 COLONIES/ML     Performed at [facility]   Culture     Final   Value: INSIGNIFICANT GROWTH     Performed at [facility]   Report Status  04/05/2014 FINAL   Final  CULTURE, BLOOD (ROUTINE X 2)     Status: None   Collection Time    03/24/2014 10:55 AM      Result Value Ref Range Status   Specimen Description BLOOD PICC LINE   Final   Special Requests BOTTLES DRAWN AEROBIC AND ANAEROBIC 10CC   Final   Culture  Setup Time     Final   Value: 03/20/2014 18:58     Performed at [facility]   Culture     Final   Value: NO GROWTH 5 DAYS     Performed at [facility]   Report Status 04/09/2014 FINAL   Final  CULTURE, BLOOD (ROUTINE X 2)     Status: None   Collection Time    04/02/2014 11:00 AM      Result Value Ref Range Status   Specimen Description BLOOD RIGHT HAND   Final   Special Requests BOTTLES DRAWN AEROBIC ONLY 10CC   Final   Culture  Setup Time     Final   Value: 04/04/2014 18:58     Performed at [facility]   Culture     Final   Value: NO GROWTH 5 DAYS     Performed at [facility]   Report Status 04/09/2014 FINAL   Final  MRSA PCR SCREENING     Status: None   Collection Time    04/04/14  5:32 PM      Result Value Ref Range Status   MRSA by PCR NEGATIVE  NEGATIVE Final   Comment:            The GeneXpert MRSA Assay (FDA     approved for NASAL specimens     only), is one component of a     comprehensive MRSA colonization     surveillance program. It is not     intended to diagnose MRSA     infection nor to guide or     monitor treatment for     MRSA infections.   Studies/Results: Dg Abd Portable 1v  04/08/2014  CLINICAL DATA:  Status post feeding catheter placement  EXAM: PORTABLE ABDOMEN - 1 VIEW  COMPARISON:  None.  FINDINGS: The feeding catheter is now seen with the tip in the distal stomach directed towards the pyloric channel. No acute abnormality is noted.   Electronically Signed   By: Inez Catalina M.D.   On: 04/08/2014 12:48   Medications:  Scheduled Meds: . antiseptic oral rinse  15 mL Mouth Rinse q12n4p  . chlorhexidine  15 mL Mouth Rinse BID  . cloNIDine  0.1  mg Transdermal Weekly  . darbepoetin (ARANESP) injection - NON-DIALYSIS  100 mcg Subcutaneous Q Tue-1800  . heparin  5,000 Units Subcutaneous 3 times per day  . insulin aspart  0-15 Units Subcutaneous 6 times per day  . lidocaine  1 patch Transdermal Q24H  . Valproic Acid  500 mg Per Tube 3 times per day   Continuous Infusions: . sodium chloride 75 mL/hr at 04/09/14 2122  . feeding supplement (VITAL AF 1.2 CAL) 1,000 mL (04/09/14 0800)   PRN Meds:.hydrALAZINE, labetalol, levalbuterol, sodium chloride Assessment/Plan: 80 y.o PMH OM, HTN, DM 2, chronic diastolic CHF, CKD, cancer, HTN heart disease. She was admitted on 03/19/2014 with acute encephalopathy, acute on chronic CKD, seizures noted likely 2/2 Cefepime toxicity and pancreatitis. She was transferred to the ICU on 5/18 for further care and back to IMTS on 5/22  #Seizures likely 2/2 Cefepime  -Neuro following. Noted to have nonconvulsive status epilepticus on 5/18  -Continue Depakene 500 tid   #Acute encephalopathy -likely 2/2 Cefepime  -pending UA and urine culture repeat   #Acute on chronic kidney failure  -creatinine was 1.78 on 4/29 with BL Cr. 1.7-1.9. Today Cr is 4.59 stable -renal following. Want disc with family b/f consider short term HD but likely NOT a good candidate  -am RFP  #Pancreatitis, acute -supportive care  -f/u amylase and lipase (1219) trended down to 174  -GI consulted this admission   #Osteomyelitis of thoracic spine (T10/T11) -ID following. Previous aspirate in 11/2013 negative on Abx but repeat MRI 02/2014 with worsening bony destruction and she was started on Dapto/Cefepime -will continue to hold abx but consider non-cephalosporin Abx with neuro fxn improves   #Chronic diastolic heart failure (stable) -echo 11/2013 with EF 60-65% with grade 1 diastolic dysfucntion -strict i/o, daily weights   #Anemia  -Aranesp per renal  -trend CBC -will w/u anemia prev Fe 33 (low) UIBC 102 (low) TIBC 135 (low)  Ferritin wnl, pending LDH, haptoglobin, smear review, fob, [place]   #HTN -Clonidine 0.1 patch q week, Hydralazine prn, prn labetalol. Renal added hydralazaine scheduled   #Protein-calorie malnutrition, severe  #F/E/N -NS 50 cc/hr  -renal function panel in the am  -Hypercalcemia Ca 10.4.  M spike neg on 5/16 SPEP -NPO getting TF with SSI-M  #DVT px  -heparin   Dispo: unknown at this time. Will likely need goals of care discussion with family   The patient does have a current PCP Isaias Cowman PCP and does need an [facility] hospital follow-up appointment after discharge.  The patient does not have transportation limitations that hinder transportation to clinic appointments.  .Services Needed at time of discharge: Y = Yes, Blank = No PT:   OT:   RN:   Equipment:   Other:     LOS: 7 days   Cresenciano Genre, MD [phone] 04/10/2014, 7:16 AM

## 2014-04-10 NOTE — Progress Notes (Signed)
Pt's hgb =7.5, this am. On-call was notified----Ezequiel Macauley, rn

## 2014-04-11 LAB — GLUCOSE, CAPILLARY
GLUCOSE-CAPILLARY: 132 mg/dL — AB (ref 70–99)
GLUCOSE-CAPILLARY: 133 mg/dL — AB (ref 70–99)
Glucose-Capillary: 137 mg/dL — ABNORMAL HIGH (ref 70–99)
Glucose-Capillary: 137 mg/dL — ABNORMAL HIGH (ref 70–99)
Glucose-Capillary: 162 mg/dL — ABNORMAL HIGH (ref 70–99)
Glucose-Capillary: 151 mg/dL — ABNORMAL HIGH (ref 70–99)
Glucose-Capillary: 153 mg/dL — ABNORMAL HIGH (ref 70–99)
Glucose-Capillary: 172 mg/dL — ABNORMAL HIGH (ref 70–99)

## 2014-04-11 LAB — BILIRUBIN, FRACTIONATED(TOT/DIR/INDIR)
Bilirubin, Direct: <0.2 mg/dL (ref 0.0–0.3)
Total Bilirubin: <0.2 mg/dL — ABNORMAL LOW (ref 0.3–1.2)

## 2014-04-11 LAB — RENAL FUNCTION PANEL
ALBUMIN: 1.6 g/dL — AB (ref 3.5–5.2)
BUN: 74 mg/dL — ABNORMAL HIGH (ref 6–23)
CO2: 19 mEq/L (ref 19–32)
Calcium: 10.4 mg/dL (ref 8.4–10.5)
Chloride: 110 mEq/L (ref 96–112)
Creatinine, Ser: 4.78 mg/dL — ABNORMAL HIGH (ref 0.50–1.10)
GFR calc Af Amer: 9 mL/min — ABNORMAL LOW (ref >90)
GFR, EST NON AFRICAN AMERICAN: 8 mL/min — AB (ref >90)
Glucose, Bld: 169 mg/dL — ABNORMAL HIGH (ref 70–99)
Phosphorus: 1.7 mg/dL — ABNORMAL LOW (ref 2.3–4.6)
Potassium: 4.1 mEq/L (ref 3.7–5.3)
Sodium: 143 mEq/L (ref 137–147)

## 2014-04-11 LAB — CBC WITH DIFFERENTIAL/PLATELET
BASOS PCT: 0 % (ref 0–1)
Basophils Absolute: 0 10*3/uL (ref 0.0–0.1)
EOS ABS: 0.1 10*3/uL (ref 0.0–0.7)
Eosinophils Relative: 1 % (ref 0–5)
HCT: 20.7 % — ABNORMAL LOW (ref 36.0–46.0)
Hemoglobin: 6.8 g/dL — CL (ref 12.0–15.0)
Lymphocytes Relative: 10 % — ABNORMAL LOW (ref 12–46)
Lymphs Abs: 1.1 10*3/uL (ref 0.7–4.0)
MCH: 28.2 pg (ref 26.0–34.0)
MCHC: 32.9 g/dL (ref 30.0–36.0)
MCV: 85.9 fL (ref 78.0–100.0)
Monocytes Absolute: 1.5 10*3/uL — ABNORMAL HIGH (ref 0.1–1.0)
Monocytes Relative: 14 % — ABNORMAL HIGH (ref 3–12)
NEUTROS PCT: 75 % (ref 43–77)
Neutro Abs: 8.1 10*3/uL — ABNORMAL HIGH (ref 1.7–7.7)
Platelets: 190 10*3/uL (ref 150–400)
RBC: 2.41 MIL/uL — ABNORMAL LOW (ref 3.87–5.11)
RDW: 16.9 % — ABNORMAL HIGH (ref 11.5–15.5)
WBC: 10.8 10*3/uL — ABNORMAL HIGH (ref 4.0–10.5)

## 2014-04-11 LAB — PREPARE RBC (CROSSMATCH)

## 2014-04-11 LAB — CBC
HEMATOCRIT: 28.4 % — AB (ref 36.0–46.0)
HEMOGLOBIN: 9.3 g/dL — AB (ref 12.0–15.0)
MCH: 28.8 pg (ref 26.0–34.0)
MCHC: 32.7 g/dL (ref 30.0–36.0)
MCV: 87.9 fL (ref 78.0–100.0)
Platelets: 188 10*3/uL (ref 150–400)
RBC: 3.23 MIL/uL — AB (ref 3.87–5.11)
RDW: 16 % — ABNORMAL HIGH (ref 11.5–15.5)
WBC: 11.5 10*3/uL — AB (ref 4.0–10.5)

## 2014-04-11 LAB — ABO/RH: ABO/RH(D): O POS

## 2014-04-11 MED ORDER — ACETAMINOPHEN 325 MG PO TABS
650.0000 mg | ORAL_TABLET | Freq: Once | ORAL | Status: AC
  Administered 2014-04-11: 650 mg
  Filled 2014-04-11: qty 2

## 2014-04-11 MED ORDER — CIPROFLOXACIN 500 MG/5ML (10%) PO SUSR
500.0000 mg | Freq: Every day | ORAL | Status: DC
  Administered 2014-04-11 – 2014-04-12 (×2): 500 mg via ORAL
  Filled 2014-04-11 (×2): qty 5

## 2014-04-11 MED ORDER — FUROSEMIDE 10 MG/ML IJ SOLN
20.0000 mg | Freq: Once | INTRAMUSCULAR | Status: AC
  Administered 2014-04-11: 20 mg via INTRAVENOUS

## 2014-04-11 MED ORDER — ACETAMINOPHEN 160 MG/5ML PO SOLN
650.0000 mg | Freq: Four times a day (QID) | ORAL | Status: DC | PRN
  Administered 2014-04-11 – 2014-04-18 (×7): 650 mg
  Filled 2014-04-11 (×6): qty 20.3

## 2014-04-11 MED ORDER — DIPHENHYDRAMINE HCL 50 MG/ML IJ SOLN
25.0000 mg | Freq: Once | INTRAMUSCULAR | Status: AC
  Administered 2014-04-11: 25 mg via INTRAVENOUS
  Filled 2014-04-11: qty 1

## 2014-04-11 MED ORDER — FLUCONAZOLE 40 MG/ML PO SUSR
100.0000 mg | Freq: Every day | ORAL | Status: AC
  Administered 2014-04-11 – 2014-04-17 (×7): 100 mg via ORAL
  Filled 2014-04-11 (×7): qty 2.5

## 2014-04-11 MED ORDER — ACETAMINOPHEN 325 MG PO TABS
650.0000 mg | ORAL_TABLET | Freq: Four times a day (QID) | ORAL | Status: DC | PRN
Start: 2014-04-11 — End: 2014-04-11

## 2014-04-11 MED ORDER — SODIUM PHOSPHATE 3 MMOLE/ML IV SOLN
20.0000 mmol | Freq: Once | INTRAVENOUS | Status: AC
  Administered 2014-04-11: 20 mmol via INTRAVENOUS
  Filled 2014-04-11: qty 6.67

## 2014-04-11 NOTE — Progress Notes (Signed)
Subjective:   blood pressure better- only made 400 of urine last 24 hours off lasix- urine looking cloudy- U/A confirms UTI with yeast- culture pending.   Creatinine worsened. Neuro, ID and GI all involved.  Neurologically still quite impaired  Objective Vital signs in last 24 hours: Filed Vitals:   04/11/14 0007 04/11/14 0400 04/11/14 0639 04/11/14 0900  BP: 170/62 136/58 163/62 136/53  Pulse: 101 106  108  Temp: 99.9 F (37.7 C) 99.8 F (37.7 C)  100.5 F (38.1 C)  TempSrc: Oral Oral  Axillary  Resp: 14 17    Height:      Weight:      SpO2: 100% 100%  100%   Weight change:   Intake/Output Summary (Last 24 hours) at 04/11/14 0911 Last data filed at 04/11/14 0700  Gross per 24 hour  Intake 3082.08 ml  Output    400 ml  Net 2682.08 ml    Assessment/ Plan: Pt is a 80 y.o. yo female who was admitted on 19-Apr-2014 with confusion. She has A on CKD in the setting of being on abx for osteo but also possibly some volume depletion with hypercalcemia  Assessment/Plan: 1. A on CKD- baseline creatinine is in the mid to high ones earlier this year... is making urine but not much.  No indications for dialysis to date.  Not a candidate for long term dialysis given everything that is going on with her right now and her poor mental status.  Heavy discussions would need to be held with family before I would consider short term dialysis either.  Renal function worsening slightly but may be due to UTI 2. HTN/vol- status is difficult to determine because originally thought was dry but hypertensive as well- Clonidine patch and prns right now- sometimes BP is good- Have added scheduled hydralazine- is better 3. Anemia- low/stable- have added aranesp and watch for transfusion need 4. ID- hx of osteo- now not on anything- urine looks cloudy, will check U/A- have started her on cipro and diflucan given results   5. Pancreatis - supportive care- numbers seem to be improving 6. Status epilepticus- per neuro-  moving to neuro ICU to sedate-keppra/depakote/ativan- still mental status quite poor- meds/sz/baseline? 7. Elytes- hydration for hypercalcemia-  replacement for hypokalemia- both better- has low phos will replete 8. Dispo- many severe issues for an 80 year old female- not sure prognosis is good. Looking at PEG/SNF- not currently a candidate for dialysis unless neurologically improves a lot- may be time for palliative care to get involved ?  [name] [name]    Labs: Basic Metabolic Panel:  Recent Labs Lab 04/09/14 0420 04/10/14 0500 04/11/14 0500  NA 143 144 143  K 3.8 4.0 4.1  CL 108 109 110  CO2 22 20 19   GLUCOSE 140* 175* 169*  BUN 55* 59* 74*  CREATININE 4.65* 4.59* 4.78*  CALCIUM 10.7* 10.4 10.4  PHOS 3.3 2.6 1.7*   Liver Function Tests:  Recent Labs Lab 04/09/14 0420 04/10/14 0500 04/11/14 0500  BILITOT  --   --  <0.2*  ALBUMIN 2.0* 1.8* 1.6*    Recent Labs Lab 04/05/14 0800 04/06/14 0754  LIPASE 247* 174*  AMYLASE  --  113*   No results found for this basename: AMMONIA,  in the last 168 hours CBC:  Recent Labs Lab 04/05/14 0800 04/07/14 0420 04/08/14 0420 04/10/14 0500 04/10/14 0846 04/11/14 0500  WBC 10.8* 8.9 10.0 11.6* 10.0 10.8*  NEUTROABS 5.7  --   --  8.8*  --  8.1*  HGB 8.9* 8.8* 8.5* 7.5* 7.1* 6.8*  HCT 26.5* 26.8* 26.3* 22.3* 21.0* 20.7*  MCV 84.9 85.1 86.2 84.5 85.4 85.9  PLT 149* 159 177 194 178 190   Cardiac Enzymes:  Recent Labs Lab 04/04/14 1230 04/05/14 0800 04/10/14 0500  CKTOTAL  --  701* 26  TROPONINI <0.30  --   --    CBG:  Recent Labs Lab 04/10/14 1223 04/10/14 1538 04/10/14 2021 04/11/14 0006 04/11/14 0421  GLUCAP 155* 153* 149* 137* 137*    Iron Studies:  No results found for this basename: IRON, TIBC, TRANSFERRIN, FERRITIN,  in the last 72 hours Studies/Results: No results found. Medications: Infusions: . sodium chloride 50 mL/hr at 04/10/14 1717  . feeding supplement (VITAL AF 1.2 CAL) 1,000  mL (04/11/14 0657)    Scheduled Medications: . acetaminophen  650 mg Per Tube Once  . antiseptic oral rinse  15 mL Mouth Rinse q12n4p  . chlorhexidine  15 mL Mouth Rinse BID  . cloNIDine  0.1 mg Transdermal Weekly  . darbepoetin (ARANESP) injection - NON-DIALYSIS  100 mcg Subcutaneous Q Tue-1800  . diphenhydrAMINE  25 mg Intravenous Once  . furosemide  20 mg Intravenous Once  . heparin  5,000 Units Subcutaneous 3 times per day  . hydrALAZINE  25 mg Oral 3 times per day  . insulin aspart  0-15 Units Subcutaneous 6 times per day  . lidocaine  1 patch Transdermal Q24H  . Valproic Acid  500 mg Per Tube 3 times per day    have reviewed scheduled and prn medications.  Physical Exam: General: moaning - no purposeful movements for me- no interaction Heart: tachy Lungs: difficult to examine- lying flat with good O2 sat Abdomen: obese, reacts to palpation Extremities: trace edema    04/11/2014,9:11 AM  LOS: 8 days

## 2014-04-11 NOTE — Progress Notes (Signed)
Internal Medicine Attending  Date: 04/11/2014  Patient name: [name] [name] Medical record number: [id number] Date of birth: [birth year] Age: 80 y.o. Gender: female  I saw and evaluated the patient. I discussed patient and reviewed the resident's note by Dr. Naaman Plummer, and I agree with the resident's findings and plans as documented in her note, with the following additional comments.  Given the lack of improvement in patient's mental status, would ask neurology to reevaluate.

## 2014-04-11 NOTE — Progress Notes (Addendum)
Subjective:  Pt seen and examined in AM. No seizure activity overnight. Pt with Hg 6.8 today and consent obtained from daughter for blood transfusion. Still lethargic and barely arousable. Not opening eyes or following command.    Objective: Vital signs in last 24 hours: Filed Vitals:   04/11/14 0007 04/11/14 0400 04/11/14 0639 04/11/14 0900  BP: 170/62 136/58 163/62 136/53  Pulse: 101 106  108  Temp: 99.9 F (37.7 C) 99.8 F (37.7 C)  100.5 F (38.1 C)  TempSrc: Oral Oral  Axillary  Resp: 14 17    Height:      Weight:      SpO2: 100% 100%  100%   Weight change:   Intake/Output Summary (Last 24 hours) at 04/11/14 0910 Last data filed at 04/11/14 0700  Gross per 24 hour  Intake 3082.08 ml  Output    400 ml  Net 2682.08 ml   Physical Examination: General: Somnolent   Eyes: Asleep Neck: Normal ROM. Neck supple.  Heart: Tachycardic, regular rhythm.  Lungs: Breathing comfortably on 2L [place]. Anterior lung fields clear. No wheezing, ronchi, or rales.  Abdominal: Soft, non-distended, non-tender, minimal BS. No rebound or guarding.  Extremities: SCD's, no edema.  Neurological: Somnolent, arousable to voice and touch. Unable to follow commands or open eyes    Lab Results: Basic Metabolic Panel:  Recent Labs Lab 04/08/14 0420 04/09/14 0420 04/10/14 0500 04/11/14 0500  NA 144 143 144 143  K 3.6* 3.8 4.0 4.1  CL 109 108 109 110  CO2 21 22 20 19   GLUCOSE 128* 140* 175* 169*  BUN 52* 55* 59* 74*  CREATININE 4.71* 4.65* 4.59* 4.78*  CALCIUM 10.8* 10.7* 10.4 10.4  MG 2.2 2.3  --   --   PHOS 3.8 3.3 2.6 1.7*   Liver Function Tests:  Recent Labs Lab 04/10/14 0500 04/11/14 0500  BILITOT  --  <0.2*  ALBUMIN 1.8* 1.6*    Recent Labs Lab 04/05/14 0800 04/06/14 0754  LIPASE 247* 174*  AMYLASE  --  113*   CBC:  Recent Labs Lab 04/10/14 0500 04/10/14 0846 04/11/14 0500  WBC 11.6* 10.0 10.8*  NEUTROABS 8.8*  --  8.1*  HGB 7.5* 7.1* 6.8*  HCT 22.3*  21.0* 20.7*  MCV 84.5 85.4 85.9  PLT 194 178 190   Cardiac Enzymes:  Recent Labs Lab 04/04/14 1230 04/05/14 0800 04/10/14 0500  CKTOTAL  --  701* 26  TROPONINI <0.30  --   --    CBG:  Recent Labs Lab 04/10/14 0820 04/10/14 1223 04/10/14 1538 04/10/14 2021 04/11/14 0006 04/11/14 0421  GLUCAP 193* 155* 153* 149* 137* 137*   Urinalysis:  Recent Labs Lab 04/10/14 0947  COLORURINE YELLOW  LABSPEC 1.018  PHURINE 5.5  GLUCOSEU NEGATIVE  HGBUR MODERATE*  BILIRUBINUR NEGATIVE  KETONESUR NEGATIVE  PROTEINUR 100*  UROBILINOGEN 0.2  NITRITE NEGATIVE  LEUKOCYTESUR LARGE*     Studies/Results: No results found. Medications: I have reviewed the patient's current medications. Scheduled Meds: . acetaminophen  650 mg Per Tube Once  . antiseptic oral rinse  15 mL Mouth Rinse q12n4p  . chlorhexidine  15 mL Mouth Rinse BID  . cloNIDine  0.1 mg Transdermal Weekly  . darbepoetin (ARANESP) injection - NON-DIALYSIS  100 mcg Subcutaneous Q Tue-1800  . diphenhydrAMINE  25 mg Intravenous Once  . furosemide  20 mg Intravenous Once  . heparin  5,000 Units Subcutaneous 3 times per day  . hydrALAZINE  25 mg Oral 3 times  per day  . insulin aspart  0-15 Units Subcutaneous 6 times per day  . lidocaine  1 patch Transdermal Q24H  . Valproic Acid  500 mg Per Tube 3 times per day   Continuous Infusions: . sodium chloride 50 mL/hr at 04/10/14 1717  . feeding supplement (VITAL AF 1.2 CAL) 1,000 mL (04/11/14 0657)   PRN Meds:.hydrALAZINE, labetalol, levalbuterol, sodium chloride Assessment/Plan:  Acute on Chronic Normocytic Anemia with possible autoimmune hemolytic anemia - Hg 6.8 requiring 2 pRBC blood transfusion. Pt with Hg below baseline 9 with no hemodynamic instability or active bleeding. Anemia panel 5/17 with reticulocytes (wnl), ferritin(253), iron (33 L), TIB (135 L), Iron sat (24%), folate (wnl), B12 (wnl). -Monitor for bleeding -F/U FOBT and UA (moderate Hg)  -Monitor CBC    -F/U hemolytic work-up---> technician blood smear (polychromasia), DAT IgG (+), haptoglobin , LDH (261 H), bili (wnl), reticulocyte, PTT, PT -Transfuse if Hg <7 with IV lasix 20 mg   -D/C SQ heparin  -Aranesp weekly (Tues) per renal  -Consider starting corticosteroid --> 1-1.5 mg/kg = 93-150 mg prednisone daily until Hg >10 then 60 mg for 1 week, then taper to 20 mg daily over 2 weeks, and then maintain at 20 mg daily for 1 month  Acute Encephalopathy in setting of Non-convulsive Status Epilepticus -  No recent seizure activity. Etiology thought to be due to cefepime neuro toxicity.  -Appreciate neurology recommendations -Frequent neuro checks -Valproic acid 500 mg TID per tube -F/U blood cultures 5/16 --> NG -Repeat CK 5/23 --> normal  -Avoid sedative medications  -Hold home cymbalta 60 mg daily  -Hold home norco 10-325 mg Q 6 hr PRN  -Palliative consult if family agrees   Nonoliguric AKI on CKD Stage 4 - Cr 4.78, above 4.29 on admission and above baseline 1.9. Etiology likely due to intrinsic renal (FeNa 4%) due to cefepime toxicity and very mild rhabdomyolysis from status epiliepticus (CK initially elevated).   -Appreciate nephrology recommendations -Avoid nephrotoxins  -Hold home torsemide 20 mg daily  -IV lasix 20 mg with blood transfusion  -Monitor BMP  -Keep foley catheter  -No HD at this time  Possible Complicated Cystitis - UA with large leukocytes, pyuria (too numerous), few bacteriuria, and many yeast. Also with moderate Hg.   -F/U urine cultures -Per renal start IV cipro  500 mg daily for 7 days (caution in setting of AMS) -Per renal start flucanazole 100 mg daily for 7 days -Keep foley catheter   Osteomyelitis of T10-T11 - ESR & CRP elevated. Currently with no sepsis criteria. Diagnosed Jan 2015. Pt has received 2 weeks of IV ceftriaxone and vancomycin; doxycyline and amoxicillin; and now IV cefepime and daptomycin since 4/29 for 6 weeks. CT revealing destructive  process at T10-T11 secondary to osteomyelitis and discitis. PICC line placed about 3 weeks ago.  -Appreciate ID recommendations -No antibiotics at this time per ID -Repeat ESR and CRP -Outpatient ID F/U scheudled 04/26/14  Acute Non-Biliary Pancreatitis - Etiology unclear, possibly due to uncontrolled hypercalcemia.   -Appreciate GI recommendations -Diet NPO, feeding supplements per tube -NS 50 mL/hr  -Repeat lipase levels  Hypercalcemia in setting of secondary hyperparathyroidism with vitamin D insufficiency - Corrected calcium high 12.3 and phos low 1.7  Last PTH with 163.8 (H), vitamin D25-OH (23 L) on 04/18/2014.  Also questionable sarcoidosis per pulmonology.  -Continue NS 50 mL/hr -Replete phosphorous with IV NaPhos  Chronic Grade 1 Diastolic CHF -  Last 2D -echo on 12/17/13 with EF 60-65% and grade  1 diastolic dysfunction.   -Hold home torsemide 20 mg daily  -Hold home imdur 15 mg daily  -Monitor daily weights and strict I & O's   Hypertension - Currently normoteisive    -Continue clonidine 0.1 mg patch weekly (at home on clonidine 0.1 mg BID) -Continue hydralazine 25 mg TID (at home on 50 mg QID) -Hold home carvedilol 25 mg BID  -Hold home torsemide 20 mg daily  -Hold home imdur 15 mg daily  -IV labetolol 10 mg PRN  Non-insulin dependent Type II DM - Last A1c of 6 on 5/16. Pt does not appear to be on medications at home.  -Sensitive SSI  -CBG monitoring Q 4 hrs while NPO   Hypokalemia - Resolved.  Magnesium levels normal. Etiology due to decreased PO intake. -Replete with caution in setting of CKD -Monitor BMP  Diet: NPO, tube feeding  DVT Ppx: SCD's  Code: Full  Dispo: SNF once medically stable   The patient does have a current PCP (No Pcp Per Patient) and does need an [facility] hospital follow-up appointment after discharge.  The patient does have transportation limitations that hinder transportation to clinic appointments.  .Services Needed at time of discharge: Y = Yes,  Blank = No PT:   OT:   RN:   Equipment:   Other:     LOS: 8 days   Juluis Mire, MD 04/11/2014, 9:10 AM

## 2014-04-11 NOTE — Progress Notes (Signed)
SLP Cancellation Note  Patient Details Name: ALAINNA STAWICKI MRN: [id number] DOB: 1930/10/13   Cancelled treatment:  Attempt x3 to complete BSE by ST but unable to complete secondary to current medical status.  Patient continues with lethargy with inability to participate fully.  ST to sign off as third attempt.  Please re-order ST when warranted.   Sharman Crate [place], [place] 04/11/2014, 10:25 AM

## 2014-04-12 ENCOUNTER — Inpatient Hospital Stay (HOSPITAL_COMMUNITY): Payer: Medicare HMO

## 2014-04-12 LAB — GLUCOSE, CAPILLARY
GLUCOSE-CAPILLARY: 130 mg/dL — AB (ref 70–99)
Glucose-Capillary: 125 mg/dL — ABNORMAL HIGH (ref 70–99)
Glucose-Capillary: 136 mg/dL — ABNORMAL HIGH (ref 70–99)
Glucose-Capillary: 148 mg/dL — ABNORMAL HIGH (ref 70–99)
Glucose-Capillary: 148 mg/dL — ABNORMAL HIGH (ref 70–99)
Glucose-Capillary: 151 mg/dL — ABNORMAL HIGH (ref 70–99)

## 2014-04-12 LAB — APTT: APTT: 41 s — AB (ref 24–37)

## 2014-04-12 LAB — CBC
HCT: 27.7 % — ABNORMAL LOW (ref 36.0–46.0)
Hemoglobin: 9.5 g/dL — ABNORMAL LOW (ref 12.0–15.0)
MCH: 29.1 pg (ref 26.0–34.0)
MCHC: 34.3 g/dL (ref 30.0–36.0)
MCV: 85 fL (ref 78.0–100.0)
Platelets: 205 10*3/uL (ref 150–400)
RBC: 3.26 MIL/uL — AB (ref 3.87–5.11)
RDW: 15.7 % — ABNORMAL HIGH (ref 11.5–15.5)
WBC: 13.4 10*3/uL — ABNORMAL HIGH (ref 4.0–10.5)

## 2014-04-12 LAB — PROTIME-INR
INR: 1.24 (ref 0.00–1.49)
PROTHROMBIN TIME: 15.3 s — AB (ref 11.6–15.2)

## 2014-04-12 LAB — TYPE AND SCREEN
ABO/RH(D): O POS
Antibody Screen: NEGATIVE
DAT, COMPLEMENT: NEGATIVE
DAT, IGG: POSITIVE
UNIT DIVISION: 0
Unit division: 0

## 2014-04-12 LAB — URINE CULTURE

## 2014-04-12 LAB — RENAL FUNCTION PANEL
ALBUMIN: 1.7 g/dL — AB (ref 3.5–5.2)
BUN: 88 mg/dL — ABNORMAL HIGH (ref 6–23)
CHLORIDE: 108 meq/L (ref 96–112)
CO2: 19 meq/L (ref 19–32)
Calcium: 10.9 mg/dL — ABNORMAL HIGH (ref 8.4–10.5)
Creatinine, Ser: 5.15 mg/dL — ABNORMAL HIGH (ref 0.50–1.10)
GFR calc Af Amer: 8 mL/min — ABNORMAL LOW (ref >90)
GFR calc non Af Amer: 7 mL/min — ABNORMAL LOW (ref >90)
Glucose, Bld: 139 mg/dL — ABNORMAL HIGH (ref 70–99)
POTASSIUM: 4.3 meq/L (ref 3.7–5.3)
Phosphorus: 2.8 mg/dL (ref 2.3–4.6)
SODIUM: 141 meq/L (ref 137–147)

## 2014-04-12 LAB — RETICULOCYTES
RBC.: 3.26 MIL/uL — ABNORMAL LOW (ref 3.87–5.11)
RETIC COUNT ABSOLUTE: 42.4 10*3/uL (ref 19.0–186.0)
RETIC CT PCT: 1.3 % (ref 0.4–3.1)

## 2014-04-12 LAB — VALPROIC ACID LEVEL: Valproic Acid Lvl: 38.6 ug/mL — ABNORMAL LOW (ref 50.0–100.0)

## 2014-04-12 LAB — LIPASE, BLOOD: Lipase: 89 U/L — ABNORMAL HIGH (ref 11–59)

## 2014-04-12 LAB — C-REACTIVE PROTEIN: CRP: 12.5 mg/dL — ABNORMAL HIGH (ref <0.60)

## 2014-04-12 LAB — HAPTOGLOBIN: HAPTOGLOBIN: 382 mg/dL — AB (ref 45–215)

## 2014-04-12 LAB — OCCULT BLOOD X 1 CARD TO LAB, STOOL: Fecal Occult Bld: NEGATIVE

## 2014-04-12 LAB — AMMONIA: AMMONIA: 25 umol/L (ref 11–60)

## 2014-04-12 LAB — SEDIMENTATION RATE: Sed Rate: 130 mm/hr — ABNORMAL HIGH (ref 0–22)

## 2014-04-12 MED ORDER — SODIUM CHLORIDE 0.9 % IV SOLN
60.0000 mg | Freq: Once | INTRAVENOUS | Status: AC
  Administered 2014-04-12: 60 mg via INTRAVENOUS
  Filled 2014-04-12 (×2): qty 20

## 2014-04-12 NOTE — Progress Notes (Signed)
NEURO HOSPITALIST PROGRESS NOTE   SUBJECTIVE:                                                                                                                        She remains poorly responsive. VPA level 38.6 Leukocytosis>13,000 but afebrile today. Cr 5.15 OBJECTIVE:                                                                                                                           Vital signs in last 24 hours: Temp:  [98.2 F (36.8 C)-101.1 F (38.4 C)] 99.2 F (37.3 C) (05/25 0409) Pulse Rate:  [95-108] 98 (05/25 0409) Resp:  [0-30] 15 (05/25 0409) BP: (119-161)/(45-61) 161/56 mmHg (05/25 0605) SpO2:  [94 %-100 %] 94 % (05/25 0409) Weight:  [92.7 kg (204 lb 5.9 oz)-95.7 kg (210 lb 15.7 oz)] 95.7 kg (210 lb 15.7 oz) (05/25 0402)  Intake/Output from previous day: 05/24 0701 - 05/25 0700 In: 1787.5 [Blood:347.5; NG/GT:1440] Out: 629 [Urine:875] Intake/Output this shift:   Nutritional status: NPO  Past Medical History  Diagnosis Date  . Hypertension   . Gout   . Hypercholesteremia   . Diabetes mellitus   . CHF (congestive heart failure)   . Renal disorder   . Cancer   . Edema   . Chronic diastolic CHF (congestive heart failure), NYHA class 2 12/27/2013  . Hypertensive heart disease 12/28/2013  . Pleural effusion on right 12/13/2013  . HCAP (healthcare-associated pneumonia) 12/22/2013  . Osteomyelitis of thoracic spine 11/29/2013  . Discitis of thoracic region 11/29/2013  . CKD (chronic kidney disease) stage 4, GFR 15-29 ml/min 11/20/2013  . Hyperparathyroidism 01/10/2014  . Generalized weakness 11/30/2013   Neurologic Exam:  Mental Status: Open eyes to painful stimuli, but for the most part poorly responsive. Cranial Nerves: II: Discs no assesses; Doesn't blink to thereat , pupils equal, round, reactive to light  III,IV, VI: ptosis not present, extra-ocular motions intact bilaterally V,VII: smile symmetric, facial light touch  sensation normal bilaterally VIII: hearing normal bilaterally IX,X: gag reflex present XI: bilateral shoulder shrug XII: midline tongue extension without atrophy or fasciculations Motor: minimal motor movements on painful stimuli Tone and bulk:normal tone throughout; no atrophy noted Sensory: reacts to pain Deep  Tendon Reflexes:  1 all over Plantars: Right: downgoing   Left: downgoing Cerebellar: Unable to test Gait:  Unable to test   Lab Results: Lab Results  Component Value Date/Time   CHOL 238* 04/16/2014  8:00 PM   Lipid Panel No results found for this basename: CHOL, TRIG, HDL, CHOLHDL, VLDL, LDLCALC,  in the last 72 hours  Studies/Results: No results found.  MEDICATIONS                                                                                                                       I have reviewed the patient's current medications.  ASSESSMENT/PLAN:                                                                                                            Altered mental status with previous NCSE on EEG in the setting of cefepime neurotoxicity. She remains poorly responsive, no significant progress in terms of mental status. Elevated white count but afebrile, Cr>5. Metabolic-toxic encephalopathy versus recurrent NCSE. Will re image her brain and get EEG today. Check ammonia to exclude superimposed valproic acid hyperammonemic encephalopathy  Will follow up.  Dorian Pod, MD [facility] [phone]  04/12/2014, 8:28 AM

## 2014-04-12 NOTE — Progress Notes (Signed)
Routine adult EEG completed, results pending. 

## 2014-04-12 NOTE — Progress Notes (Addendum)
Subjective:  Pt seen and examined in AM. No seizure activity overnight. Hg improved to 9.5 today. Still lethargic and barely arousable. Not opening eyes or following commands but continues to be responsive to painful stimuli.     Objective: Vital signs in last 24 hours: Filed Vitals:   04/12/14 0402 04/12/14 0409 04/12/14 0605 04/12/14 1200  BP:  157/54 161/56 172/57  Pulse:  98  95  Temp:  99.2 F (37.3 C)  99.4 F (37.4 C)  TempSrc:  Oral  Oral  Resp:  15  19  Height:      Weight: 95.7 kg (210 lb 15.7 oz)     SpO2:  94%  98%   Weight change:   Intake/Output Summary (Last 24 hours) at 04/12/14 1545 Last data filed at 04/12/14 1500  Gross per 24 hour  Intake 2787.5 ml  Output   1000 ml  Net 1787.5 ml   Physical Examination: General: Somnolent   Eyes: Asleep Neck: Normal ROM. Neck supple.  Heart: Normal rate and regular rhythm.  Lungs: Breathing comfortably on RA. Anterior lung fields clear. No wheezing, ronchi, or rales.  Abdominal: Soft, non-distended, non-tender, minimal BS. No rebound or guarding.  Extremities: SCD's, no edema.  Neurological: Somnolent. Not arousable to voice or touch. Withdraws to painful stimuli. Unable to follow commands or open eyes.    Lab Results: Basic Metabolic Panel:  Recent Labs Lab 04/08/14 0420 04/09/14 0420  04/11/14 0500 04/12/14 0430  NA 144 143  < > 143 141  K 3.6* 3.8  < > 4.1 4.3  CL 109 108  < > 110 108  CO2 21 22  < > 19 19  GLUCOSE 128* 140*  < > 169* 139*  BUN 52* 55*  < > 74* 88*  CREATININE 4.71* 4.65*  < > 4.78* 5.15*  CALCIUM 10.8* 10.7*  < > 10.4 10.9*  MG 2.2 2.3  --   --   --   PHOS 3.8 3.3  < > 1.7* 2.8  < > = values in this interval not displayed. Liver Function Tests:  Recent Labs Lab 04/11/14 0500 04/12/14 0430  BILITOT <0.2*  --   ALBUMIN 1.6* 1.7*    Recent Labs Lab 04/06/14 0754 04/12/14 0430  LIPASE 174* 89*  AMYLASE 113*  --    CBC:  Recent Labs Lab 04/10/14 0500   04/11/14 0500 04/11/14 1930 04/12/14 0430  WBC 11.6*  < > 10.8* 11.5* 13.4*  NEUTROABS 8.8*  --  8.1*  --   --   HGB 7.5*  < > 6.8* 9.3* 9.5*  HCT 22.3*  < > 20.7* 28.4* 27.7*  MCV 84.5  < > 85.9 87.9 85.0  PLT 194  < > 190 188 205  < > = values in this interval not displayed. Cardiac Enzymes:  Recent Labs Lab 04/10/14 0500  CKTOTAL 26   CBG:  Recent Labs Lab 04/11/14 1826 04/11/14 1953 04/11/14 2335 04/12/14 0346 04/12/14 0835 04/12/14 1211  GLUCAP 172* 132* 133* 125* 130* 136*   Urinalysis:  Recent Labs Lab 04/10/14 0947  COLORURINE YELLOW  LABSPEC 1.018  PHURINE 5.5  GLUCOSEU NEGATIVE  HGBUR MODERATE*  BILIRUBINUR NEGATIVE  KETONESUR NEGATIVE  PROTEINUR 100*  UROBILINOGEN 0.2  NITRITE NEGATIVE  LEUKOCYTESUR LARGE*     Studies/Results: No results found. Medications: I have reviewed the patient's current medications. Scheduled Meds: . antiseptic oral rinse  15 mL Mouth Rinse q12n4p  . chlorhexidine  15 mL Mouth  Rinse BID  . ciprofloxacin  500 mg Oral Daily  . cloNIDine  0.1 mg Transdermal Weekly  . darbepoetin (ARANESP) injection - NON-DIALYSIS  100 mcg Subcutaneous Q Tue-1800  . fluconazole  100 mg Oral Daily  . hydrALAZINE  25 mg Oral 3 times per day  . insulin aspart  0-15 Units Subcutaneous 6 times per day  . lidocaine  1 patch Transdermal Q24H  . Valproic Acid  500 mg Per Tube 3 times per day   Continuous Infusions: . feeding supplement (VITAL AF 1.2 CAL) 1,000 mL (04/12/14 0800)   PRN Meds:.acetaminophen (TYLENOL) oral liquid 160 mg/5 mL, hydrALAZINE, labetalol, levalbuterol, sodium chloride Assessment/Plan:  Acute Encephalopathy in setting of Non-convulsive Status Epilepticus -  No recent seizure activity. Etiology thought to be due to cefepime neuro toxicity.  -Appreciate neurology recommendations -Obtain repeat MRI brain and EEG today  -F/U ammonia levels ---> normal -Frequent neuro checks -Valproic acid 500 mg TID per tube, levels  low (38.6), consider increasing dose  -Avoid sedative medications  -Hold home cymbalta 60 mg daily  -Hold home norco 10-325 mg Q 6 hr PRN  -Palliative consult if family agrees   Acute on Chronic Normocytic Anemia - Hg 9.5 after 2 pRBC blood transfusion on 5/24 with Hg 6.8. Pt with Hg below baseline 9 with no hemodynamic instability or active bleeding. Anemia panel 5/17 with reticulocytes (wnl), ferritin(253), iron (33 L), TIB (135 L), Iron sat (24%), folate (wnl), B12 (wnl). -Monitor for bleeding -F/U FOBT (NEG) and UA (moderate Hg)  -Monitor CBC  -F/U hemolytic work-up---> technician blood smear (polychromasia), pathologist review, DAT IgG (+), haptoglobin (382 H) , LDH (261 H), bili (wnl), reticulocyte, PTT (41 H), PT (15.3) -Transfuse if Hg <7    -Aranesp weekly (Tues) per renal   Nonoliguric AKI on CKD Stage 4 - Cr 5.15, above 4.29 on admission and above baseline 1.9. Etiology likely due to intrinsic renal (FeNa 4%) due to cefepime toxicity and very mild rhabdomyolysis from status epiliepticus (CK initially elevated).   -Appreciate nephrology recommendations -Avoid nephrotoxins  -Hold home torsemide 20 mg daily  -Monitor BMP  -Keep foley catheter  -No HD at this time  Complicated Cystitis - UA with large leukocytes, pyuria (too numerous), few bacteriuria, and many yeast. Also with moderate Hg.   -F/U urine cultures --> 100 K yeast -D/C IV cipro  -Continue  flucanazole 100 mg Day 2/7  -Keep foley catheter   Osteomyelitis of T10-T11 - ESR & CRP elevated. Diagnosed Jan 2015. Pt has received 2 weeks of IV ceftriaxone and vancomycin; doxycyline and amoxicillin; and now IV cefepime and daptomycin since 4/29 for 6 weeks. CT revealing destructive process at T10-T11 secondary to osteomyelitis and discitis. PICC line placed about 3 weeks ago.  -Appreciate ID recommendations -No antibiotics at this time per ID -Repeat ESR and CRP --> worsened to 130 & 12.5 respectively  -Outpatient ID F/U  scheudled 04/26/14  Acute Non-Biliary Pancreatitis - Etiology unclear, possibly due to uncontrolled hypercalcemia.   -Appreciate GI recommendations -Diet NPO, feeding supplements per tube -Repeat lipase levels --> improved to 89 from 1219 on admission -Consider repeating imaging CT without contrast  Hypercalcemia in setting of secondary hyperparathyroidism with vitamin D insufficiency - Corrected calcium high 12.7.  Last PTH with 163.8 (H), vitamin D25-OH (23 L) on 04/05/2014.  Also questionable sarcoidosis per pulmonology.  -Per nephrology, IV pamidronate x 1  -F/U PTH levels  Chronic Grade 1 Diastolic CHF -  Last 2D -echo on 12/17/13 with  EF 60-65% and grade 1 diastolic dysfunction.   -Hold home torsemide 20 mg daily  -Hold home imdur 15 mg daily  -Monitor daily weights and strict I & O's   Hypertension - Currently hypertensive.    -Continue clonidine 0.1 mg patch weekly (at home on clonidine 0.1 mg BID) -Continue hydralazine 25 mg TID (at home on 50 mg QID) -Hold home carvedilol 25 mg BID  -Hold home torsemide 20 mg daily  -Hold home imdur 15 mg daily  -IV labetolol 10 mg PRN  Non-insulin dependent Type II DM - Last A1c of 6 on 5/16. Pt does not appear to be on medications at home.  -Sensitive SSI  -CBG monitoring Q 4 hrs while NPO   Hypokalemia - Resolved.  Magnesium levels normal. Etiology due to decreased PO intake. -Replete with caution in setting of CKD -Monitor BMP  Diet: NPO, tube feeding  DVT Ppx: SCD's  Code: Full  Dispo: SNF once medically stable   The patient does have a current PCP (No Pcp Per Patient) and does need an [facility] hospital follow-up appointment after discharge.  The patient does have transportation limitations that hinder transportation to clinic appointments.  .Services Needed at time of discharge: Y = Yes, Blank = No PT:   OT:   RN:   Equipment:   Other:     LOS: 9 days   Juluis Mire, MD 04/12/2014, 3:45 PM

## 2014-04-12 NOTE — Procedures (Signed)
EEG report.  Brief clinical history: Altered mental status with previous NCSE on EEG in the setting of cefepime neurotoxicity.  She remains poorly responsive  Technique: this is a 18 channel routine scalp EEG performed at the bedside with bipolar and monopolar montages arranged in accordance to the international 10/20 system of electrode placement. One channel was dedicated to EKG recording. No activating procedures performed.  Description: as the study begins and throughput the entire recording, there is generalized, continuous, no reactive theta-delta slowing that doesn't follow an ictal pattern at any time. No focal or generalized epileptiform discharges noted.  EKG showed sinus rhythm.  Impression: this is an abnormal EEG demonstrating findings consistent with a moderately severe encephalopathy, non specific as to cause. No electrographic seizures noted.  Clinical correlation is advised.  Dorian Pod, MD

## 2014-04-12 NOTE — Progress Notes (Signed)
Subjective: Interval History: none.  Objective: Vital signs in last 24 hours: Temp:  [98.2 F (36.8 C)-101.1 F (38.4 C)] 99.2 F (37.3 C) (05/25 0409) Pulse Rate:  [95-108] 98 (05/25 0409) Resp:  [0-30] 15 (05/25 0409) BP: (119-161)/(45-61) 161/56 mmHg (05/25 0605) SpO2:  [94 %-100 %] 94 % (05/25 0409) Weight:  [92.7 kg (204 lb 5.9 oz)-95.7 kg (210 lb 15.7 oz)] 95.7 kg (210 lb 15.7 oz) (05/25 0402) Weight change:   Intake/Output from previous day: 05/24 0701 - 05/25 0700 In: 1787.5 [Blood:347.5; NG/GT:1440] Out: 875 [Urine:875] Intake/Output this shift:    General appearance: only moans, not responsive Resp: rales bibasilar and rhonchi bilaterally Cardio: regular rate and rhythm GI: obese, pos bs,liver down 5 cm Extremities: edema 3+  Lab Results:  Recent Labs  04/11/14 1930 04/12/14 0430  WBC 11.5* 13.4*  HGB 9.3* 9.5*  HCT 28.4* 27.7*  PLT 188 205   BMET:  Recent Labs  04/11/14 0500 04/12/14 0430  NA 143 141  K 4.1 4.3  CL 110 108  CO2 19 19  GLUCOSE 169* 139*  BUN 74* 88*  CREATININE 4.78* 5.15*  CALCIUM 10.4 10.9*   No results found for this basename: PTH,  in the last 72 hours Iron Studies: No results found for this basename: IRON, TIBC, TRANSFERRIN, FERRITIN,  in the last 72 hours  Studies/Results: No results found.  I have reviewed the patient's current medications.  Assessment/Plan: 1 CKD/AKI  Not oliguric but not diuresing appropriately for vol status.  Slow rise in solute level and has mild acidemia.  Not candidate for therapy at this point with overall poor health status. Need to get Palliative care involved. 2 ^ Ca, rec using Calcitonin or 1 dose Pamidronate 3 Sz 4 Pancreatitis improving 5 Massive obesity 6 Malnutrition 7 HTn would let bp run up a little to maximize RBF 8Osteo of spine 9 Overall poor status need to get plan for Family and patient 10 UTI on cipro and Difluc P conservative management at this time.  Rec calcitonin or  1 dose Pamidronate, Palliative care consult.  LOS: 9 days   James L Deterding 04/12/2014,8:35 AM

## 2014-04-12 NOTE — Clinical Social Work Note (Signed)
Pt transferred from 67M.  2C Covering CSW now following.  Nonnie Done, [place] [phone]  Clinical Social Work

## 2014-04-12 NOTE — Progress Notes (Signed)
Internal Medicine On-Call Attending  Date: 04/12/2014  Patient name: [name] [name] Medical record number: [id number] Date of birth: 1930-02-28 Age: 80 y.o. Gender: female  I saw and evaluated the patient. I discussed patient and reviewed the resident's note by Dr. Naaman Plummer, and I agree with the resident's findings and plans as documented in her note.

## 2014-04-13 ENCOUNTER — Inpatient Hospital Stay (HOSPITAL_COMMUNITY): Payer: Medicare HMO

## 2014-04-13 DIAGNOSIS — M79609 Pain in unspecified limb: Secondary | ICD-10-CM

## 2014-04-13 LAB — CBC
HCT: 26.2 % — ABNORMAL LOW (ref 36.0–46.0)
Hemoglobin: 8.9 g/dL — ABNORMAL LOW (ref 12.0–15.0)
MCH: 28.9 pg (ref 26.0–34.0)
MCHC: 34 g/dL (ref 30.0–36.0)
MCV: 85.1 fL (ref 78.0–100.0)
PLATELETS: 205 10*3/uL (ref 150–400)
RBC: 3.08 MIL/uL — AB (ref 3.87–5.11)
RDW: 16 % — ABNORMAL HIGH (ref 11.5–15.5)
WBC: 11.1 10*3/uL — ABNORMAL HIGH (ref 4.0–10.5)

## 2014-04-13 LAB — PATHOLOGIST SMEAR REVIEW

## 2014-04-13 LAB — URIC ACID: URIC ACID, SERUM: 8.9 mg/dL — AB (ref 2.4–7.0)

## 2014-04-13 LAB — GLUCOSE, CAPILLARY
GLUCOSE-CAPILLARY: 137 mg/dL — AB (ref 70–99)
GLUCOSE-CAPILLARY: 157 mg/dL — AB (ref 70–99)
GLUCOSE-CAPILLARY: 157 mg/dL — AB (ref 70–99)
Glucose-Capillary: 153 mg/dL — ABNORMAL HIGH (ref 70–99)
Glucose-Capillary: 156 mg/dL — ABNORMAL HIGH (ref 70–99)
Glucose-Capillary: 171 mg/dL — ABNORMAL HIGH (ref 70–99)

## 2014-04-13 LAB — RENAL FUNCTION PANEL
Albumin: 1.7 g/dL — ABNORMAL LOW (ref 3.5–5.2)
BUN: 106 mg/dL — ABNORMAL HIGH (ref 6–23)
CALCIUM: 11 mg/dL — AB (ref 8.4–10.5)
CO2: 18 meq/L — AB (ref 19–32)
Chloride: 107 mEq/L (ref 96–112)
Creatinine, Ser: 5.41 mg/dL — ABNORMAL HIGH (ref 0.50–1.10)
GFR calc non Af Amer: 7 mL/min — ABNORMAL LOW (ref >90)
GFR, EST AFRICAN AMERICAN: 8 mL/min — AB (ref >90)
GLUCOSE: 151 mg/dL — AB (ref 70–99)
Phosphorus: 2.8 mg/dL (ref 2.3–4.6)
Potassium: 4.9 mEq/L (ref 3.7–5.3)
SODIUM: 142 meq/L (ref 137–147)

## 2014-04-13 MED ORDER — DAPTOMYCIN 500 MG IV SOLR
500.0000 mg | INTRAVENOUS | Status: DC
  Filled 2014-04-13: qty 10

## 2014-04-13 MED ORDER — CARVEDILOL 25 MG PO TABS
25.0000 mg | ORAL_TABLET | Freq: Two times a day (BID) | ORAL | Status: DC
  Administered 2014-04-14 – 2014-04-18 (×9): 25 mg
  Filled 2014-04-13 (×13): qty 1

## 2014-04-13 MED ORDER — SODIUM CHLORIDE 0.9 % IV SOLN
500.0000 mg | INTRAVENOUS | Status: DC
  Administered 2014-04-13 – 2014-04-19 (×4): 500 mg via INTRAVENOUS
  Filled 2014-04-13 (×8): qty 10

## 2014-04-13 MED ORDER — HEPARIN SODIUM (PORCINE) 5000 UNIT/ML IJ SOLN
5000.0000 [IU] | Freq: Three times a day (TID) | INTRAMUSCULAR | Status: DC
  Administered 2014-04-13 – 2014-04-20 (×20): 5000 [IU] via SUBCUTANEOUS
  Filled 2014-04-13 (×27): qty 1

## 2014-04-13 MED ORDER — IOHEXOL 300 MG/ML  SOLN
25.0000 mL | INTRAMUSCULAR | Status: AC
  Administered 2014-04-13: 25 mL via ORAL

## 2014-04-13 MED ORDER — SODIUM CHLORIDE 0.9 % IV SOLN
500.0000 mg | INTRAVENOUS | Status: DC
  Filled 2014-04-13: qty 10

## 2014-04-13 MED ORDER — CARVEDILOL 25 MG PO TABS
25.0000 mg | ORAL_TABLET | Freq: Two times a day (BID) | ORAL | Status: DC
Start: 2014-04-13 — End: 2014-04-13
  Administered 2014-04-13 (×2): 25 mg via ORAL
  Filled 2014-04-13 (×3): qty 1

## 2014-04-13 NOTE — Progress Notes (Signed)
Subjective: Interval History: none.  Objective: Vital signs in last 24 hours: Temp:  [98 F (36.7 C)-99.7 F (37.6 C)] 98 F (36.7 C) (05/26 0728) Pulse Rate:  [87-98] 98 (05/26 0728) Resp:  [0-19] 16 (05/26 0728) BP: (139-172)/(42-57) 162/52 mmHg (05/26 0728) SpO2:  [94 %-98 %] 95 % (05/26 0728) Weight change:   Intake/Output from previous day: 05/25 0701 - 05/26 0700 In: 1930 [NG/GT:1410; IV Piggyback:520] Out: 725 [Urine:725] Intake/Output this shift:    General appearance: not responsive except to pain Resp: diminished breath sounds bilaterally and rhonchi bilaterally Cardio: S1, S2 normal and systolic murmur: holosystolic 2/6, blowing at apex GI: obese, pos bs, soft, liver down 4 cm Pelvic: not done  Lab Results:  Recent Labs  04/12/14 0430 04/13/14 0430  WBC 13.4* 11.1*  HGB 9.5* 8.9*  HCT 27.7* 26.2*  PLT 205 205   BMET:  Recent Labs  04/12/14 0430 04/13/14 0430  NA 141 142  K 4.3 4.9  CL 108 107  CO2 19 18*  GLUCOSE 139* 151*  BUN 88* 106*  CREATININE 5.15* 5.41*  CALCIUM 10.9* 11.0*   No results found for this basename: PTH,  in the last 72 hours Iron Studies: No results found for this basename: IRON, TIBC, TRANSFERRIN, FERRITIN,  in the last 72 hours  Studies/Results: No results found.  I have reviewed the patient's current medications.  Assessment/Plan: 1  AKI UTI with pyelo/sepsis vs AIN from AB.  Mod acidemia, K ok, rising solute.  Not candidate for invasive therapies given current status and comorbidities.  May still recover 2 Sz disorder 3 Pancreatitis better 4 Osteo of spine on AB 5 Anemia slowly worse 6 Obesityh 7 ^ lipids 8^ Ca Pamidronate P cont current mgmt, Rec Palliative Care    LOS: 10 days   Miela Desjardin L Mellonie Guess 04/13/2014,8:15 AM

## 2014-04-13 NOTE — Progress Notes (Signed)
    Day 10 of stay      Patient name: [name] [name]  Medical record number: [id number]  Date of birth: Aug 06, 1930   [name] has not regained consciousness. She appears to be neurologically unchanged. EEG does not show any seizure focus right now.   Filed Vitals:   04/13/14 0000 04/13/14 0405 04/13/14 0728 04/13/14 1300  BP: 148/49 145/54 162/52 168/71  Pulse: 91 91 98 93  Temp:  98.5 F (36.9 C) 98 F (36.7 C) 100.2 F (37.9 C)  TempSrc:  Axillary Axillary Axillary  Resp: 0 17 16 17   Height:      Weight:      SpO2: 95% 94% 95% 93%     Exam - Not alert, not responding to verbal commands. Responds to pain stimuli anywhere on the entire body. PERRL, mouth looks deviated to left side, withdraws to pain, moves all extremities. Pain when palpating ankle and left foot. Cardiac exam within normal limits. No pain abdomen on palpation.   Reviewed labs and images.    Assessment and Plan     Per neurology - MRI today to assess further changes.     Per ID - Start daptomycin - agree.   CKD worsening - no emergent dialysis needed. Will continue fluids.   Goals of care discussion initiated many times by my team, but the family does not seem to be ready just yet.  Pancreatitis seems better, no abdominal pain. CT abdomen pelvis repeat in the setting of encephalopathy.  Left ankle and foot pain - Xray and doppler.  Low valproate levels - increase dose?? Discuss with neurology.   I have discussed the care of this patient with my IM team residents. Please see the resident note for details.  Casidy Alberta 04/13/2014, 1:25 PM.

## 2014-04-13 NOTE — Progress Notes (Signed)
ANTIBIOTIC CONSULT NOTE - INITIAL  Pharmacy Consult for Daptomycin Indication: T10/T11 Osteomyelitis  Allergies  Allergen Reactions  . Cefepime     Seizures, neurotoxicity  . Motrin [Ibuprofen] Hives   Patient Measurements: Height: 5\' 2"  (157.5 cm) Weight: 210 lb 15.7 oz (95.7 kg) IBW/kg (Calculated) : 50.1 Vital Signs: Temp: 99.6 F (37.6 C) (05/26 1415) Temp src: Axillary (05/26 1415) BP: 168/71 mmHg (05/26 1300) Pulse Rate: 93 (05/26 1300) Intake/Output from previous day: 05/25 0701 - 05/26 0700 In: 1930 [NG/GT:1410; IV Piggyback:520] Out: 725 [Urine:725] Intake/Output from this shift: Total I/O In: 30 [NG/GT:30] Out: -   Labs:  Recent Labs  04/11/14 0500 04/11/14 1930 04/12/14 0430 04/13/14 0430  WBC 10.8* 11.5* 13.4* 11.1*  HGB 6.8* 9.3* 9.5* 8.9*  PLT 190 188 205 205  CREATININE 4.78*  --  5.15* 5.41*   Estimated Creatinine Clearance: 8.8 ml/min (by C-G formula based on Cr of 5.41). No results found for this basename: Letta Median, VANCORANDOM, GENTTROUGH, GENTPEAK, GENTRANDOM, TOBRATROUGH, TOBRAPEAK, TOBRARND, AMIKACINPEAK, AMIKACINTROU, AMIKACIN,  in the last 72 hours   Microbiology: 5/17 MRSA PCR -negative 5/16 Urine - insignificant growth 5/16 Blood - negative 5/23 Urine - >100K yeast  Medications:  Anti-infectives   Start     Dose/Rate Route Frequency Ordered Stop   04/11/14 1000  fluconazole (DIFLUCAN) 40 MG/ML suspension 100 mg     100 mg Oral Daily 04/11/14 0917 04/18/14 0959   04/11/14 1000  ciprofloxacin (CIPRO) 500 MG/5ML (10%) suspension 500 mg  Status:  Discontinued     500 mg Oral Daily 04/11/14 0917 04/12/14 1624   04/04/14 1000  ceFEPIme (MAXIPIME) 1 g in dextrose 5 % 50 mL IVPB  Status:  Discontinued     1 g 100 mL/hr over 30 Minutes Intravenous Every 24 hours 03/31/2014 1821 04/04/14 0931   04/04/14 1000  DAPTOmycin (CUBICIN) 530.5 mg in sodium chloride 0.9 % IVPB  Status:  Discontinued     6 mg/kg  88.4 kg 221.2 mL/hr  over 30 Minutes Intravenous Every 24 hours 04/04/2014 1821 04/04/14 0138   04/04/2014 1230  ciprofloxacin (CIPRO) IVPB 400 mg     400 mg 200 mL/hr over 60 Minutes Intravenous  Once 03/20/2014 1225 04/09/2014 1423   04/10/2014 1200  cefTRIAXone (ROCEPHIN) 1 g in dextrose 5 % 50 mL IVPB  Status:  Discontinued     1 g 100 mL/hr over 30 Minutes Intravenous  Once 03/29/2014 1147 04/14/2014 1147   04/09/2014 1200  ceFEPIme (MAXIPIME) 2 g in dextrose 5 % 50 mL IVPB  Status:  Discontinued     2 g 100 mL/hr over 30 Minutes Intravenous Every 12 hours 04/07/2014 1148 03/26/2014 1225     Assessment: [name] [name] from SNF who had been treated as outpatient by Dr. Tommy Medal for osteomyelitis/discitis which stopped on 5/17 due to elevated CK (584>>701) and encephalopathy and pancreatitis, now to restart daptomycin per ID recommendations with prior issues resolving. Prior daptomycin dose was 6mg /kg (530mg ) q48hrs. Patient has acute kidney failure but not a candidate for invasive therapies due to current status and comorbidities. Patient has PICC line in place.   Goal of Therapy:  Clinical resolution of infection  Plan:  Daptomycin 500 mg IV q48h.  Weekly CK while on therapy  Sloan Leiter, PharmD, BCPS Clinical Pharmacist [phone] 04/13/2014,2:37 PM

## 2014-04-13 NOTE — Progress Notes (Signed)
CSW checked patient's [facility] - she is not a [employer] copy- thus will require commercial [employer] authorization prior to return to [employer]. Updated clinical information sent to [facility] for review and will require further updates as patient progresses towards stability to d/c.  CSW services will continue to follow and assist as needed.  Lorie Phenix. [place], [place]

## 2014-04-13 NOTE — Progress Notes (Signed)
NEURO HOSPITALIST PROGRESS NOTE   SUBJECTIVE:                                                                                                                        Neurologically unchanged. EEG showed no electrographic seizures but evidence of a moderately severe encephalopathy. MRI  Brain pending. Cr. 5.41, WBC 11.1  OBJECTIVE:                                                                                                                           Vital signs in last 24 hours: Temp:  [98 F (36.7 C)-99.7 F (37.6 C)] 98 F (36.7 C) (05/26 0728) Pulse Rate:  [87-98] 98 (05/26 0728) Resp:  [0-19] 16 (05/26 0728) BP: (139-172)/(42-57) 162/52 mmHg (05/26 0728) SpO2:  [94 %-98 %] 95 % (05/26 0728)  Intake/Output from previous day: 05/25 0701 - 05/26 0700 In: 1930 [NG/GT:1410; IV Piggyback:520] Out: 725 [Urine:725] Intake/Output this shift:   Nutritional status: NPO  Past Medical History  Diagnosis Date  . Hypertension   . Gout   . Hypercholesteremia   . Diabetes mellitus   . CHF (congestive heart failure)   . Renal disorder   . Cancer   . Edema   . Chronic diastolic CHF (congestive heart failure), NYHA class 2 12/27/2013  . Hypertensive heart disease 12/28/2013  . Pleural effusion on right 12/13/2013  . HCAP (healthcare-associated pneumonia) 12/22/2013  . Osteomyelitis of thoracic spine 11/29/2013  . Discitis of thoracic region 11/29/2013  . CKD (chronic kidney disease) stage 4, GFR 15-29 ml/min 11/20/2013  . Hyperparathyroidism 01/10/2014  . Generalized weakness 11/30/2013    Neurologic Exam:  Mental Status:  Open eyes to painful stimuli, but for the most part poorly responsive.  Cranial Nerves:  II: Discs no assesses; Doesn't blink to thereat , pupils equal, round, reactive to light  III,IV, VI: ptosis not present, extra-ocular motions intact bilaterally  V,VII: smile symmetric, facial light touch sensation normal bilaterally  VIII:  hearing normal bilaterally  IX,X: gag reflex present  XI: bilateral shoulder shrug  XII: midline tongue extension without atrophy or fasciculations  Motor:  minimal motor movements on painful stimuli  Tone and bulk:normal tone throughout; no atrophy noted  Sensory: reacts  to pain  Deep Tendon Reflexes:  1 all over  Plantars:  Right: downgoing Left: downgoing  Cerebellar:  Unable to test  Gait:  Unable to test      Lab Results: Lab Results  Component Value Date/Time   CHOL 238* 03/26/2014  8:00 PM   Lipid Panel No results found for this basename: CHOL, TRIG, HDL, CHOLHDL, VLDL, LDLCALC,  in the last 72 hours  Studies/Results: No results found.  MEDICATIONS                                                                                                                        Scheduled: . antiseptic oral rinse  15 mL Mouth Rinse q12n4p  . carvedilol  25 mg Oral BID WC  . chlorhexidine  15 mL Mouth Rinse BID  . cloNIDine  0.1 mg Transdermal Weekly  . darbepoetin (ARANESP) injection - NON-DIALYSIS  100 mcg Subcutaneous Q Tue-1800  . fluconazole  100 mg Oral Daily  . hydrALAZINE  25 mg Oral 3 times per day  . insulin aspart  0-15 Units Subcutaneous 6 times per day  . lidocaine  1 patch Transdermal Q24H  . Valproic Acid  500 mg Per Tube 3 times per day    ASSESSMENT/PLAN:                                                                                                           Altered mental status with previous NCSE on EEG in the setting of cefepime neurotoxicity. Repeat EEG showed no electrographic seizures but findings supportive of a moderately severe encephalopathy. MRI brain pending. Suspect patient's persisting altered mental status most likely due to metabolic-toxic encephalopathy. Will follow up.  Dorian Pod, MD [facility] [phone]  04/13/2014, 8:37 AM

## 2014-04-13 NOTE — Progress Notes (Addendum)
Subjective:  Pt seen and examined in AM. No seizure activity overnight. Still lethargic and barely arousable. Not opening eyes or following commands but continues to be responsive to painful stimuli.  Pt's daughter still not amenable to palliative consult and would like to see response every day.    Objective: Vital signs in last 24 hours: Filed Vitals:   04/13/14 0728 04/13/14 1300 04/13/14 1415 04/13/14 1555  BP: 162/52 168/71  166/52  Pulse: 98 93  91  Temp: 98 F (36.7 C) 100.2 F (37.9 C) 99.6 F (37.6 C) 98.9 F (37.2 C)  TempSrc: Axillary Axillary Axillary Axillary  Resp: 16 17  16   Height:      Weight:      SpO2: 95% 93%  93%   Weight change:   Intake/Output Summary (Last 24 hours) at 04/13/14 1708 Last data filed at 04/13/14 1557  Gross per 24 hour  Intake    840 ml  Output    650 ml  Net    190 ml   Physical Examination: General: Somnolent   Eyes: Asleep Neck: Normal ROM. Neck supple.  Heart: Normal rate and regular rhythm.  Lungs: Breathing comfortably on RA. Anterior lung fields clear. No wheezing, ronchi, or rales.  Abdominal: Soft, non-distended, non-tender, minimal BS. No rebound or guarding.  Extremities: SCD's. Left ankle swelling and tenderness to palpation. Neurological: Somnolent. Not arousable to voice or touch. Withdraws to painful stimuli. Unable to follow commands or open eyes. Positive babinski b/l   Lab Results: Basic Metabolic Panel:  Recent Labs Lab 04/08/14 0420 04/09/14 0420  04/12/14 0430 04/13/14 0430  NA 144 143  < > 141 142  K 3.6* 3.8  < > 4.3 4.9  CL 109 108  < > 108 107  CO2 21 22  < > 19 18*  GLUCOSE 128* 140*  < > 139* 151*  BUN 52* 55*  < > 88* 106*  CREATININE 4.71* 4.65*  < > 5.15* 5.41*  CALCIUM 10.8* 10.7*  < > 10.9* 11.0*  MG 2.2 2.3  --   --   --   PHOS 3.8 3.3  < > 2.8 2.8  < > = values in this interval not displayed. Liver Function Tests:  Recent Labs Lab 04/11/14 0500 04/12/14 0430  04/13/14 0430  BILITOT <0.2*  --   --   ALBUMIN 1.6* 1.7* 1.7*    Recent Labs Lab 04/12/14 0430  LIPASE 89*   CBC:  Recent Labs Lab 04/10/14 0500  04/11/14 0500  04/12/14 0430 04/13/14 0430  WBC 11.6*  < > 10.8*  < > 13.4* 11.1*  NEUTROABS 8.8*  --  8.1*  --   --   --   HGB 7.5*  < > 6.8*  < > 9.5* 8.9*  HCT 22.3*  < > 20.7*  < > 27.7* 26.2*  MCV 84.5  < > 85.9  < > 85.0 85.1  PLT 194  < > 190  < > 205 205  < > = values in this interval not displayed. Cardiac Enzymes:  Recent Labs Lab 04/10/14 0500  CKTOTAL 26   CBG:  Recent Labs Lab 04/12/14 2227 04/13/14 0408 04/13/14 0727 04/13/14 0927 04/13/14 1256 04/13/14 1421  GLUCAP 151* 137* 153* 157* 156* 157*   Urinalysis:  Recent Labs Lab 04/10/14 0947  COLORURINE YELLOW  LABSPEC 1.018  PHURINE 5.5  GLUCOSEU NEGATIVE  HGBUR MODERATE*  BILIRUBINUR NEGATIVE  KETONESUR NEGATIVE  PROTEINUR 100*  UROBILINOGEN  0.2  NITRITE NEGATIVE  LEUKOCYTESUR LARGE*     Studies/Results: No results found. Medications: I have reviewed the patient's current medications. Scheduled Meds: . antiseptic oral rinse  15 mL Mouth Rinse q12n4p  . carvedilol  25 mg Oral BID WC  . chlorhexidine  15 mL Mouth Rinse BID  . cloNIDine  0.1 mg Transdermal Weekly  . [START ON 04/15/2014] DAPTOmycin (CUBICIN)  IV  500 mg Intravenous Q48H  . darbepoetin (ARANESP) injection - NON-DIALYSIS  100 mcg Subcutaneous Q Tue-1800  . fluconazole  100 mg Oral Daily  . heparin subcutaneous  5,000 Units Subcutaneous 3 times per day  . hydrALAZINE  25 mg Oral 3 times per day  . insulin aspart  0-15 Units Subcutaneous 6 times per day  . Valproic Acid  500 mg Per Tube 3 times per day   Continuous Infusions: . feeding supplement (VITAL AF 1.2 CAL) 1,000 mL (04/13/14 1642)   PRN Meds:.acetaminophen (TYLENOL) oral liquid 160 mg/5 mL, hydrALAZINE, labetalol, levalbuterol, sodium chloride Assessment/Plan:  Acute Encephalopathy in setting of  Non-convulsive Status Epilepticus & Metabolic Toxicity -  No recent seizure activity. Etiology thought to be due to cefepime neuro toxicity.  -Appreciate neurology recommendations -Obtain repeat MRI brain today  -EEG on 5/25 ---> no electrographic seizures but findings supportive of a moderately severe encephalopathy -Frequent neuro checks -Valproic acid 500 mg TID per tube, levels low (38.6), consider increasing dose  -Avoid sedative medications  -Hold home cymbalta 60 mg daily  -Hold home norco 10-325 mg Q 6 hr PRN  -Palliative consult if family agrees   History of gout with left ankle pain and swelling  - Pt with left foot swelling and tenderness on exam. Pt was not on gout medications at home.  -Obtain xray left ankle -Obtain b/l doppler LE [place] -SQ heparin TID and SCD's for DVT Ppx -Consider colchicine 1.2 mg x 1 then 0.6 mg 1 hr later (no adjustment for CrCl <30)   Acute on Chronic Normocytic Anemia - Hg 8.9 near baseline Hg 9 with no hemodynamic instability or active bleeding. Pt received 2 pRBC's on 5/24 due to Hg 6.8.  Anemia panel 5/17 with reticulocytes (wnl), ferritin(253), iron (33 L), TIB (135 L), Iron sat (24%), folate (wnl), B12 (wnl). Etiology possibly due to autoimmune hemolytic anemia in setting of recent antibiotics (Coombs test +).  -Monitor for bleeding -Monitor CBC  -F/U hemolytic work-up---> pathologist (Absolute neutrophilia with mild left shift in maturation, favoring reactive process) and technician smear review  -Obtain fibrinogen levels -Transfuse if Hg <7    -Aranesp weekly (Tues) per renal  -Consider corticosteroids if worsens   Nonoliguric AKI on CKD Stage 4 - Cr 5.41, above 4.29 on admission and above baseline 1.9. Etiology likely due to intrinsic renal (FeNa 4%) due to cefepime toxicity and very mild rhabdomyolysis from status epiliepticus (CK initially elevated).   -Appreciate nephrology recommendations -Avoid nephrotoxins  -Hold home torsemide 20 mg  daily  -Monitor BMP  -Keep foley catheter  -No HD at this time per nephrology   Osteomyelitis of T10-T11 - ESR & CRP elevated from admission.  Diagnosed Jan 2015. Pt has received 2 weeks of IV ceftriaxone and vancomycin; doxycyline and amoxicillin; and now IV cefepime and daptomycin since 4/29 for 6 weeks. CT revealing destructive process at T10-T11 secondary to osteomyelitis and discitis. PICC line placed about 3 weeks ago.  -Appreciate ID recommendations -ID to start daptomycin and possible ceftriaxone or meropenem if tolerates and ok with neurology  -Obtain  CK level -Follow leukocytosis and fever curve -Outpatient ID F/U scheudled 05/21/52  Complicated Cystitis - UA with large leukocytes, pyuria (too numerous), few bacteriuria, and many yeast. Urine cultures with  > 100 K yeast -No antibiotics indicated -Continue flucanazole 100 mg Day 3/7  -Keep foley catheter   Acute Non-Biliary Pancreatitis - Repeat lipase levels improve (89 from 1219 from admission).  Etiology unclear, possibly due to uncontrolled hypercalcemia.   -Appreciate GI recommendations -Diet NPO, feeding supplements per tube -Obtain CT abd/pelvis with oral contrast  Chronic Hypercalcemia in setting of secondary hyperparathyroidism with vitamin D insufficiency - Corrected calcium high 12.8.  Last PTH with 163.8 (H), vitamin D25-OH (23 L) on 04/01/2014.  Also questionable sarcoidosis per pulmonology.  -Pt received  IV pamidronate x 1 on 5/25  -F/U PTH levels  Chronic Grade 1 Diastolic CHF -  Last 2D -echo on 12/17/13 with EF 60-65% and grade 1 diastolic dysfunction.   -Hold home torsemide 20 mg daily  -Hold home imdur 15 mg daily  -Monitor daily weights and strict I & O's   Hypertension - Currently hypertensive.    -Continue clonidine 0.1 mg patch weekly (at home on clonidine 0.1 mg BID) -Continue hydralazine 25 mg TID (at home on 50 mg QID) -Resume home carvedilol 25 mg BID  -Hold home torsemide 20 mg daily  -Hold home  imdur 15 mg daily  -IV labetolol 10 mg PRN  Non-insulin dependent Type II DM - Last A1c of 6 on 5/16. Pt does not appear to be on medications at home.  -Sensitive SSI  -CBG monitoring Q 4 hrs while NPO   Hypokalemia - Resolved.  Magnesium levels normal. Etiology due to decreased PO intake. -Replete with caution in setting of CKD -Monitor BMP  Diet: NPO, tube feeding  DVT Ppx: SQ Heparin TID, SCD's  Code: Full  Dispo: SNF once medically stable   The patient does have a current PCP (No Pcp Per Patient) and does need an [facility] hospital follow-up appointment after discharge.  The patient does have transportation limitations that hinder transportation to clinic appointments.  .Services Needed at time of discharge: Y = Yes, Blank = No PT:   OT:   RN:   Equipment:   Other:     LOS: 10 days   Juluis Mire, MD 04/13/2014, 5:08 PM

## 2014-04-13 NOTE — Progress Notes (Signed)
INFECTIOUS DISEASE PROGRESS NOTE  ID: [name] [name] is a 80 y.o. female with  Principal Problem:   Seizures Active Problems:   Osteomyelitis of thoracic spine   Acute encephalopathy   Pancreatitis, acute   Acute on chronic renal failure   Acute on chronic kidney failure   Non-convulsive status epilepticus   Protein-calorie malnutrition, severe   Anemia of chronic disease  Subjective: Resting quietly  Abtx:  Anti-infectives   Start     Dose/Rate Route Frequency Ordered Stop   04/11/14 1000  fluconazole (DIFLUCAN) 40 MG/ML suspension 100 mg     100 mg Oral Daily 04/11/14 0917 04/18/14 0959   04/11/14 1000  ciprofloxacin (CIPRO) 500 MG/5ML (10%) suspension 500 mg  Status:  Discontinued     500 mg Oral Daily 04/11/14 0917 04/12/14 1624   04/04/14 1000  ceFEPIme (MAXIPIME) 1 g in dextrose 5 % 50 mL IVPB  Status:  Discontinued     1 g 100 mL/hr over 30 Minutes Intravenous Every 24 hours 04/10/2014 1821 04/04/14 0931   04/04/14 1000  DAPTOmycin (CUBICIN) 530.5 mg in sodium chloride 0.9 % IVPB  Status:  Discontinued     6 mg/kg  88.4 kg 221.2 mL/hr over 30 Minutes Intravenous Every 24 hours 04/08/2014 1821 04/04/14 0138   03/28/2014 1230  ciprofloxacin (CIPRO) IVPB 400 mg     400 mg 200 mL/hr over 60 Minutes Intravenous  Once 03/30/2014 1225 04/05/2014 1423   04/07/2014 1200  cefTRIAXone (ROCEPHIN) 1 g in dextrose 5 % 50 mL IVPB  Status:  Discontinued     1 g 100 mL/hr over 30 Minutes Intravenous  Once 04/04/2014 1147 04/14/2014 1147   04/14/2014 1200  ceFEPIme (MAXIPIME) 2 g in dextrose 5 % 50 mL IVPB  Status:  Discontinued     2 g 100 mL/hr over 30 Minutes Intravenous Every 12 hours 03/20/2014 1148 03/31/2014 1225      Medications:  Scheduled: . antiseptic oral rinse  15 mL Mouth Rinse q12n4p  . carvedilol  25 mg Oral BID WC  . chlorhexidine  15 mL Mouth Rinse BID  . cloNIDine  0.1 mg Transdermal Weekly  . darbepoetin (ARANESP) injection - NON-DIALYSIS  100 mcg Subcutaneous Q Tue-1800  .  fluconazole  100 mg Oral Daily  . hydrALAZINE  25 mg Oral 3 times per day  . insulin aspart  0-15 Units Subcutaneous 6 times per day  . lidocaine  1 patch Transdermal Q24H  . Valproic Acid  500 mg Per Tube 3 times per day    Objective: Vital signs in last 24 hours: Temp:  [98 F (36.7 C)-99.7 F (37.6 C)] 98 F (36.7 C) (05/26 0728) Pulse Rate:  [87-98] 98 (05/26 0728) Resp:  [0-19] 16 (05/26 0728) BP: (139-172)/(42-57) 162/52 mmHg (05/26 0728) SpO2:  [94 %-98 %] 95 % (05/26 0728)   General appearance: no distress Resp: clear to auscultation bilaterally Cardio: regular rate and rhythm GI: normal findings: bowel sounds normal and soft, non-tender  Lab Results  Recent Labs  04/12/14 0430 04/13/14 0430  WBC 13.4* 11.1*  HGB 9.5* 8.9*  HCT 27.7* 26.2*  NA 141 142  K 4.3 4.9  CL 108 107  CO2 19 18*  BUN 88* 106*  CREATININE 5.15* 5.41*   Liver Panel  Recent Labs  04/11/14 0500 04/12/14 0430 04/13/14 0430  ALBUMIN 1.6* 1.7* 1.7*  BILITOT <0.2*  --   --   BILIDIR <0.2  --   --   IBILI  NOT CALCULATED  --   --    Sedimentation Rate  Recent Labs  04/12/14 0430  ESRSEDRATE 130*   C-Reactive Protein  Recent Labs  04/12/14 0430  CRP 12.5*    Microbiology: Recent Results (from the past 240 hour(s))  URINE CULTURE     Status: None   Collection Time    04/06/2014 10:53 AM      Result Value Ref Range Status   Specimen Description URINE, RANDOM   Final   Special Requests NONE   Final   Culture  Setup Time     Final   Value: 03/25/2014 19:46     Performed at [place]     Final   Value: 3,000 COLONIES/ML     Performed at [facility]   Culture     Final   Value: INSIGNIFICANT GROWTH     Performed at [facility]   Report Status 04/05/2014 FINAL   Final  CULTURE, BLOOD (ROUTINE X 2)     Status: None   Collection Time    03/20/2014 10:55 AM      Result Value Ref Range Status   Specimen Description BLOOD PICC  LINE   Final   Special Requests BOTTLES DRAWN AEROBIC AND ANAEROBIC 10CC   Final   Culture  Setup Time     Final   Value: 03/27/2014 18:58     Performed at [facility]   Culture     Final   Value: NO GROWTH 5 DAYS     Performed at [facility]   Report Status 04/09/2014 FINAL   Final  CULTURE, BLOOD (ROUTINE X 2)     Status: None   Collection Time    04/17/2014 11:00 AM      Result Value Ref Range Status   Specimen Description BLOOD RIGHT HAND   Final   Special Requests BOTTLES DRAWN AEROBIC ONLY 10CC   Final   Culture  Setup Time     Final   Value: 03/27/2014 18:58     Performed at [facility]   Culture     Final   Value: NO GROWTH 5 DAYS     Performed at [facility]   Report Status 04/09/2014 FINAL   Final  MRSA PCR SCREENING     Status: None   Collection Time    04/04/14  5:32 PM      Result Value Ref Range Status   MRSA by PCR NEGATIVE  NEGATIVE Final   Comment:            The GeneXpert MRSA Assay (FDA     approved for NASAL specimens     only), is one component of a     comprehensive MRSA colonization     surveillance program. It is not     intended to diagnose MRSA     infection nor to guide or     monitor treatment for     MRSA infections.  URINE CULTURE     Status: None   Collection Time    04/10/14  5:17 PM      Result Value Ref Range Status   Specimen Description URINE, CATHETERIZED   Final   Special Requests NONE   Final   Culture  Setup Time     Final   Value: 04/11/2014 03:48     Performed at [place]     Final  Value: >=100,000 COLONIES/ML     Performed at [facility]   Culture     Final   Value: YEAST     Performed at [facility]   Report Status 04/12/2014 FINAL   Final    Studies/Results: No results found.   Assessment/Plan: Discitis/Osteo  Aspirate January (-) on anbx, vanco/ceftriaxone  Repeat MRI 4-24- worsening of osteo/discitis T10-11. Started on  Dapto/cefepime 5-4  Encephalopathy, seizures (due to cefepime)  ARF  Pancreatitis (lipase 1219)  Total days of antibiotics: off   Lipase significantly better Will restart dapto Very limited gram negative options that don't increase seizure risk (avoid imiepenem, aztreonam...) If she tolerates this, start pt on ceftriaxone or merrem if ok with neuro          [facility] (pager) [phone] [email or web address] 04/13/2014, 9:30 AM  LOS: 10 days   **Disclaimer: This note may have been dictated with voice recognition software. Similar sounding words can inadvertently be transcribed and this note may contain transcription errors which may not have been corrected upon publication of note.**

## 2014-04-13 NOTE — Progress Notes (Signed)
  I have seen and examined the patient, and reviewed the daily progress note by Jori Moll L. Richardson [place], [place] and discussed the care of the patient with them. Please see my progress note from 04/13/2014 for further details regarding assessment and plan.    Signed:  Juluis Mire, MD 04/13/2014, 7:22 PM

## 2014-04-13 NOTE — Progress Notes (Signed)
Subjective:  No acute events overnight. Patient was seen in the morning.  EEG results from the previous day showed no focal or generalized epileptiform discharge noted but showed moderately-severe encephalopathy. Still lethargic and barely arousable. Not opening eyes or following commands but continues to be responsive to painful stimuli. Early Chars was in the room and his questions were answered.     Objective: Vital signs in last 24 hours: Filed Vitals:   04/13/14 0000 04/13/14 0405 04/13/14 0728 04/13/14 1300  BP: 148/49 145/54 162/52 168/71  Pulse: 91 91 98 93  Temp:  98.5 F (36.9 C) 98 F (36.7 C) 100.2 F (37.9 C)  TempSrc:  Axillary Axillary Axillary  Resp: 0 17 16 17   Height:      Weight:      SpO2: 95% 94% 95% 93%   Weight change:   Intake/Output Summary (Last 24 hours) at 04/13/14 1334 Last data filed at 04/13/14 0934  Gross per 24 hour  Intake   1080 ml  Output    600 ml  Net    480 ml   Physical Examination: General: Somnolent   Eyes: Asleep Neck: Normal ROM. Neck supple.  Heart: Normal rate and regular rhythm.  Lungs: Breathing comfortably on RA. Anterior lung fields clear. No wheezing, ronchi, or rales.  Abdominal: Soft, non-distended, non-tender, minimal BS. No rebound or guarding.  Extremities: SCD's, no edema.  Neurological: Somnolent. Not arousable to voice or touch. Withdraws to painful stimuli. Unable to follow commands or open eyes.    Lab Results: Basic Metabolic Panel:  Recent Labs Lab 04/08/14 0420 04/09/14 0420  04/12/14 0430 04/13/14 0430  NA 144 143  < > 141 142  K 3.6* 3.8  < > 4.3 4.9  CL 109 108  < > 108 107  CO2 21 22  < > 19 18*  GLUCOSE 128* 140*  < > 139* 151*  BUN 52* 55*  < > 88* 106*  CREATININE 4.71* 4.65*  < > 5.15* 5.41*  CALCIUM 10.8* 10.7*  < > 10.9* 11.0*  MG 2.2 2.3  --   --   --   PHOS 3.8 3.3  < > 2.8 2.8  < > = values in this interval not displayed. Liver Function Tests:  Recent Labs Lab 04/11/14 0500  04/12/14 0430 04/13/14 0430  BILITOT <0.2*  --   --   ALBUMIN 1.6* 1.7* 1.7*    Recent Labs Lab 04/12/14 0430  LIPASE 89*   CBC:  Recent Labs Lab 04/10/14 0500  04/11/14 0500  04/12/14 0430 04/13/14 0430  WBC 11.6*  < > 10.8*  < > 13.4* 11.1*  NEUTROABS 8.8*  --  8.1*  --   --   --   HGB 7.5*  < > 6.8*  < > 9.5* 8.9*  HCT 22.3*  < > 20.7*  < > 27.7* 26.2*  MCV 84.5  < > 85.9  < > 85.0 85.1  PLT 194  < > 190  < > 205 205  < > = values in this interval not displayed. Cardiac Enzymes:  Recent Labs Lab 04/10/14 0500  CKTOTAL 26   CBG:  Recent Labs Lab 04/12/14 1944 04/12/14 2227 04/13/14 0408 04/13/14 0727 04/13/14 0927 04/13/14 1256  GLUCAP 148* 151* 137* 153* 157* 156*   Urinalysis:  Recent Labs Lab 04/10/14 0947  COLORURINE YELLOW  LABSPEC 1.018  PHURINE 5.5  GLUCOSEU NEGATIVE  HGBUR MODERATE*  BILIRUBINUR NEGATIVE  KETONESUR NEGATIVE  PROTEINUR 100*  UROBILINOGEN 0.2  NITRITE NEGATIVE  LEUKOCYTESUR LARGE*     Studies/Results: No results found. Medications: I have reviewed the patient's current medications. Scheduled Meds: . antiseptic oral rinse  15 mL Mouth Rinse q12n4p  . carvedilol  25 mg Oral BID WC  . chlorhexidine  15 mL Mouth Rinse BID  . cloNIDine  0.1 mg Transdermal Weekly  . darbepoetin (ARANESP) injection - NON-DIALYSIS  100 mcg Subcutaneous Q Tue-1800  . fluconazole  100 mg Oral Daily  . heparin subcutaneous  5,000 Units Subcutaneous 3 times per day  . hydrALAZINE  25 mg Oral 3 times per day  . insulin aspart  0-15 Units Subcutaneous 6 times per day  . Valproic Acid  500 mg Per Tube 3 times per day   Continuous Infusions: . feeding supplement (VITAL AF 1.2 CAL) 1,000 mL (04/12/14 2041)   PRN Meds:.acetaminophen (TYLENOL) oral liquid 160 mg/5 mL, hydrALAZINE, labetalol, levalbuterol, sodium chloride Assessment/Plan:  Acute Encephalopathy in setting of Non-convulsive Status Epilepticus -  No recent seizure activity.  Etiology thought to be due to cefepime neuro toxicity.  -Appreciate neurology recommendations -Obtain repeat MRI brain today - EEG on 5/25 showed an abnormal EEG consistent with moderately severe encephalopathy -F/U ammonia levels ---> normal -Frequent neuro checks -Valproic acid 500 mg TID per tube, levels low (38.6), consider increasing dose  -Avoid sedative medications  -Hold home cymbalta 60 mg daily  -Hold home norco 10-325 mg Q 6 hr PRN  -Palliative consult if family agrees   Acute on Chronic Normocytic Anemia - Hg 9.5 after 2 pRBC blood transfusion on 5/24 with Hg 6.8. Pt with Hg below baseline 9 with no hemodynamic instability or active bleeding. Anemia panel 5/17 with reticulocytes (wnl), ferritin(253), iron (33 L), TIB (135 L), Iron sat (24%), folate (wnl), B12 (wnl). -Monitor for bleeding -F/U FOBT (NEG) and UA (moderate Hg)  -Monitor CBC  -F/U hemolytic work-up---> technician blood smear (polychromasia), pathologist review, DAT IgG (+), haptoglobin (382 H) , LDH (261 H), bili (wnl), reticulocyte, PTT (41 H), PT (15.3) -Transfuse if Hg <7    -Aranesp weekly (Tues) per renal   Nonoliguric AKI on CKD Stage 4 - Cr 5.15, above 4.29 on admission and above baseline 1.9. Etiology likely due to intrinsic renal (FeNa 4%) due to cefepime toxicity and very mild rhabdomyolysis from status epiliepticus (CK initially elevated).   -Appreciate nephrology recommendations -Avoid nephrotoxins  -Hold home torsemide 20 mg daily  -Monitor BMP  -Keep foley catheter  -No HD at this time  Complicated Cystitis - UA with large leukocytes, pyuria (too numerous), few bacteriuria, and many yeast. Also with moderate Hg.   -F/U urine cultures --> 100 K yeast -D/C IV cipro  -Continue  flucanazole 100 mg Day 2/7  -Keep foley catheter   Osteomyelitis of T10-T11 - ESR & CRP elevated. Diagnosed Jan 2015. Pt has received 2 weeks of IV ceftriaxone and vancomycin; doxycyline and amoxicillin; and now IV cefepime  and daptomycin since 4/29 for 6 weeks. CT revealing destructive process at T10-T11 secondary to osteomyelitis and discitis. PICC line placed about 3 weeks ago.  -Appreciate ID recommendations -No antibiotics at this time per ID -Repeat ESR and CRP --> worsened to 130 & 12.5 respectively  -Outpatient ID F/U scheudled 04/26/14  Acute Non-Biliary Pancreatitis - Etiology unclear, possibly due to uncontrolled hypercalcemia.   -Appreciate GI recommendations -Diet NPO, feeding supplements per tube -Repeat lipase levels --> improved to 89 from 1219 on admission -CT without contrast ordered for today  Hypercalcemia in setting  of secondary hyperparathyroidism with vitamin D insufficiency - Corrected calcium high 12.7.  Last PTH with 163.8 (H), vitamin D25-OH (23 L) on 04/14/2014.  Also questionable sarcoidosis per pulmonology.  -Per nephrology, IV pamidronate x 1  -F/U PTH levels  Chronic Grade 1 Diastolic CHF -  Last 2D -echo on 12/17/13 with EF 60-65% and grade 1 diastolic dysfunction.   -Hold home torsemide 20 mg daily  -Hold home imdur 15 mg daily  -Monitor daily weights and strict I & O's   Hypertension - Currently hypertensive.    -Continue clonidine 0.1 mg patch weekly (at home on clonidine 0.1 mg BID) -Continue hydralazine 25 mg TID (at home on 50 mg QID) -Started carvedilol 25 mg BID on 5/26 -Hold home torsemide 20 mg daily  -Hold home imdur 15 mg daily  -IV labetolol 10 mg PRN  Non-insulin dependent Type II DM - Last A1c of 6 on 5/16. Pt does not appear to be on medications at home.  -Sensitive SSI  -CBG monitoring Q 4 hrs while NPO   Hypokalemia - Resolved.  Magnesium levels normal. Etiology due to decreased PO intake. -Replete with caution in setting of CKD -Monitor BMP  Left foot pain - Patient withdraws and says "ouch" to manipulation and palpation of the left ankle, no painful reaction when palpation of right foot. Slight swelling, most likely due to fluid resuscitation.  -  Ordered lower extremity bilateral venous duplex - X-ray ordered for suspicion of fractured ankle.  Diet: NPO, tube feeding  DVT Ppx: SCD's and heparin injection 5,000 units Code: Full  Dispo: SNF once medically stable   The patient does have a current PCP (No Pcp Per Patient) and does need an [facility] hospital follow-up appointment after discharge.  The patient does have transportation limitations that hinder transportation to clinic appointments.  .Services Needed at time of discharge: Y = Yes, Blank = No PT:   OT:   RN:   Equipment:   Other:     LOS: 10 days   Judithe Modest, Med Student 04/13/2014, 1:34 PM

## 2014-04-14 ENCOUNTER — Inpatient Hospital Stay (HOSPITAL_COMMUNITY): Payer: Medicare HMO

## 2014-04-14 DIAGNOSIS — M79609 Pain in unspecified limb: Secondary | ICD-10-CM

## 2014-04-14 LAB — CBC WITH DIFFERENTIAL/PLATELET
BASOS ABS: 0 10*3/uL (ref 0.0–0.1)
BASOS PCT: 0 % (ref 0–1)
Eosinophils Absolute: 0.1 10*3/uL (ref 0.0–0.7)
Eosinophils Relative: 1 % (ref 0–5)
HEMATOCRIT: 25.7 % — AB (ref 36.0–46.0)
HEMOGLOBIN: 8.6 g/dL — AB (ref 12.0–15.0)
LYMPHS PCT: 12 % (ref 12–46)
Lymphs Abs: 1.2 10*3/uL (ref 0.7–4.0)
MCH: 28.4 pg (ref 26.0–34.0)
MCHC: 33.5 g/dL (ref 30.0–36.0)
MCV: 84.8 fL (ref 78.0–100.0)
MONO ABS: 1.5 10*3/uL — AB (ref 0.1–1.0)
Monocytes Relative: 15 % — ABNORMAL HIGH (ref 3–12)
NEUTROS ABS: 7.1 10*3/uL (ref 1.7–7.7)
Neutrophils Relative %: 72 % (ref 43–77)
Platelets: 208 10*3/uL (ref 150–400)
RBC: 3.03 MIL/uL — ABNORMAL LOW (ref 3.87–5.11)
RDW: 16 % — AB (ref 11.5–15.5)
WBC: 9.9 10*3/uL (ref 4.0–10.5)

## 2014-04-14 LAB — FIBRINOGEN: Fibrinogen: >800 mg/dL — ABNORMAL HIGH (ref 204–475)

## 2014-04-14 LAB — RENAL FUNCTION PANEL
Albumin: 1.5 g/dL — ABNORMAL LOW (ref 3.5–5.2)
BUN: 125 mg/dL — ABNORMAL HIGH (ref 6–23)
CALCIUM: 10.3 mg/dL (ref 8.4–10.5)
CHLORIDE: 104 meq/L (ref 96–112)
CO2: 17 mEq/L — ABNORMAL LOW (ref 19–32)
CREATININE: 5.84 mg/dL — AB (ref 0.50–1.10)
GFR calc Af Amer: 7 mL/min — ABNORMAL LOW (ref >90)
GFR, EST NON AFRICAN AMERICAN: 6 mL/min — AB (ref >90)
Glucose, Bld: 199 mg/dL — ABNORMAL HIGH (ref 70–99)
Phosphorus: 2.7 mg/dL (ref 2.3–4.6)
Potassium: 5.3 mEq/L (ref 3.7–5.3)
Sodium: 140 mEq/L (ref 137–147)

## 2014-04-14 LAB — HEPATIC FUNCTION PANEL
ALBUMIN: 1.5 g/dL — AB (ref 3.5–5.2)
ALT: 24 U/L (ref 0–35)
AST: 39 U/L — AB (ref 0–37)
Alkaline Phosphatase: 68 U/L (ref 39–117)
Total Protein: 6.3 g/dL (ref 6.0–8.3)

## 2014-04-14 LAB — GLUCOSE, CAPILLARY: Glucose-Capillary: 198 mg/dL — ABNORMAL HIGH (ref 70–99)

## 2014-04-14 LAB — TECHNOLOGIST SMEAR REVIEW

## 2014-04-14 LAB — PARATHYROID HORMONE, INTACT (NO CA): PTH: 241.9 pg/mL — ABNORMAL HIGH (ref 14.0–72.0)

## 2014-04-14 MED ORDER — POLYETHYLENE GLYCOL 3350 17 G PO PACK
17.0000 g | PACK | Freq: Every day | ORAL | Status: DC
  Administered 2014-04-16: 17 g
  Filled 2014-04-14 (×5): qty 1

## 2014-04-14 MED ORDER — FUROSEMIDE 10 MG/ML IJ SOLN
20.0000 mg | Freq: Once | INTRAMUSCULAR | Status: AC
  Administered 2014-04-14: 20 mg via INTRAVENOUS
  Filled 2014-04-14: qty 2

## 2014-04-14 MED ORDER — SODIUM BICARBONATE 650 MG PO TABS
1300.0000 mg | ORAL_TABLET | Freq: Three times a day (TID) | ORAL | Status: DC
  Administered 2014-04-14 – 2014-04-18 (×15): 1300 mg
  Filled 2014-04-14 (×18): qty 2

## 2014-04-14 MED ORDER — MEROPENEM 500 MG IV SOLR
500.0000 mg | INTRAVENOUS | Status: DC
  Administered 2014-04-14 – 2014-04-19 (×6): 500 mg via INTRAVENOUS
  Filled 2014-04-14 (×10): qty 0.5

## 2014-04-14 NOTE — Progress Notes (Signed)
Subjective: Interval History: no change overnight by nursing staff.  Objective: Vital signs in last 24 hours: Temp:  [98 F (36.7 C)-100.2 F (37.9 C)] 98.8 F (37.1 C) (05/27 0741) Pulse Rate:  [76-97] 97 (05/27 0741) Resp:  [6-22] 17 (05/27 0741) BP: (100-180)/(40-73) 128/53 mmHg (05/27 0741) SpO2:  [91 %-96 %] 94 % (05/27 0741) Weight:  [95 kg (209 lb 7 oz)] 95 kg (209 lb 7 oz) (05/27 0412) Weight change:   Intake/Output from previous day: 05/26 0701 - 05/27 0700 In: 140 [NG/GT:30; IV Piggyback:110] Out: 625 [Urine:625] Intake/Output this shift:    General appearance: responds only to pain stim Resp: rhonchi bilaterally Cardio: S1, S2 normal and systolic murmur: holosystolic 2/6, blowing at apex GI: obese,pos bs, soft Extremities: edema 2+  Lab Results:  Recent Labs  04/13/14 0430 04/14/14 0500  WBC 11.1* 9.9  HGB 8.9* 8.6*  HCT 26.2* 25.7*  PLT 205 208   BMET:  Recent Labs  04/13/14 0430 04/14/14 0500  NA 142 140  K 4.9 5.3  CL 107 104  CO2 18* 17*  GLUCOSE 151* 199*  BUN 106* 125*  CREATININE 5.41* 5.84*  CALCIUM 11.0* 10.3   No results found for this basename: PTH,  in the last 72 hours Iron Studies: No results found for this basename: IRON, TIBC, TRANSFERRIN, FERRITIN,  in the last 72 hours  Studies/Results: Ct Abdomen Pelvis Wo Contrast  04/13/2014   CLINICAL DATA:  Pancreatitis, renal insufficiency, 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  COMPARISON:  04/09/2014  FINDINGS: Limited exam without IV contrast and diffuse motion artifact.  Lower chest: Nodular bibasilar opacities noted, slightly worse suspicious for bibasilar pneumonia versus aspiration. Heart is enlarged. No pericardial effusion. No significant pleural effusion.  Abdomen: Feeding tube terminates in the distal stomach. Prior  cholecystectomy noted. No gross biliary dilatation. There is interval improvement in the previously described strandy edema and fluid about the proximal pancreatic head. Findings compatible with resolved pancreatitis. No peripancreatic fluid collection to suggest pseudocyst or abscess. No ductal dilatation appreciated. Kidneys demonstrate low-density cystic lesions bilaterally compatible with renal cysts. No renal obstruction or hydronephrosis.  Aortic atherosclerosis noted without aneurysm.  Negative for bowel obstruction, dilatation, or ileus. Normal appendix demonstrated.  No abdominal fluid collection, abscess, hemorrhage, or adenopathy.  Pelvis: Large amount of stool in the rectum. No pelvic fluid collection, hemorrhage, definite abscess, inguinal abnormality, or hernia. Bladder is collapsed by Foley catheter.  T10-11 osseous destructive process involving the disc space noted compatible with known spondylo discitis. Degenerative changes elsewhere in the thoracic and lumbar spine.  IMPRESSION: Limited exam with motion artifact.  Resolved edema and fluid about the proximal pancreas, compatible with improving pancreatitis.  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   On: 04/13/2014 18:27   Dg Ankle Complete Left  04/13/2014   CLINICAL DATA:  Left ankle and foot pain  EXAM: LEFT ANKLE COMPLETE - 3+ VIEW  COMPARISON:  04/13/2014  FINDINGS: Bones are osteopenic. Normal left ankle alignment without fracture. Distal tibia, fibula, talus and calcaneus appear intact. Vascular calcifications noted. Degenerative changes of the talonavicular articulation.  IMPRESSION: No acute osseous finding.   Electronically Signed   By: Daryll Brod M.D.   On: 04/13/2014 18:57  Dg Foot Complete Left  04/13/2014   CLINICAL DATA:  Left ankle and foot pain  EXAM: LEFT FOOT - COMPLETE 3+ VIEW   COMPARISON:  04/13/2014, 08/11/2004  FINDINGS: Diffuse left foot soft tissue swelling on the lateral view. Bones are osteopenic. Degenerative changes of the left talonavicular articulation with joint space loss, sclerosis and subchondral cyst formation. Normal alignment without acute fracture.  IMPRESSION: Soft tissue swelling.  Midfoot osteoarthritis  No acute osseous finding or fracture   Electronically Signed   By: Daryll Brod M.D.   On: 04/13/2014 19:00    I have reviewed the patient's current medications.  Assessment/Plan: 1 AKI/CKD nonoliguric , acidemic,Cr cont to rise. UTI vs AIN from AB.  Not candidate for invasive investigation or aggressive mgmt.  May turn around yet.  Clearly seeing build up of uremic toxins and wgt up 7 kg 2 Osteo of spine 3 Pancreatitis resolved 4 Anemia stable 5 CHF 6 SZ per Neuro 7 OSA 8 Severe debill 9 Pulm fibrosis P Rec Na bicarb 1300 mg tid per tube, limit vol, counsel family 7     LOS: 11 days   Tivis Wherry L [place] Wert 04/14/2014,9:06 AM

## 2014-04-14 NOTE — Progress Notes (Signed)
ANTIBIOTIC CONSULT NOTE - FOLLOW UP  Pharmacy Consult for Adding Merrem Indication: Osteomyelitis  Allergies  Allergen Reactions  . Cefepime     Seizures, neurotoxicity  . Motrin [Ibuprofen] Hives    Patient Measurements: Height: 5\' 2"  (157.5 cm) Weight: 209 lb 7 oz (95 kg) IBW/kg (Calculated) : 50.1  Vital Signs: Temp: 97.9 F (36.6 C) (05/27 1200) Temp src: Oral (05/27 1200) BP: 156/50 mmHg (05/27 1200) Pulse Rate: 96 (05/27 1200) Intake/Output from previous day: 05/26 0701 - 05/27 0700 In: 140 [NG/GT:30; IV Piggyback:110] Out: 625 [Urine:625] Intake/Output from this shift: Total I/O In: 1950 [NG/GT:1950] Out: -   Labs:  Recent Labs  04/12/14 0430 04/13/14 0430 04/14/14 0500  WBC 13.4* 11.1* 9.9  HGB 9.5* 8.9* 8.6*  PLT 205 205 208  CREATININE 5.15* 5.41* 5.84*   Estimated Creatinine Clearance: 8.1 ml/min (by C-G formula based on Cr of 5.84). No results found for this basename: VANCOTROUGH, Corlis Leak, VANCORANDOM, GENTTROUGH, GENTPEAK, GENTRANDOM, TOBRATROUGH, TOBRAPEAK, TOBRARND, AMIKACINPEAK, AMIKACINTROU, AMIKACIN,  in the last 72 hours   Microbiology: Recent Results (from the past 720 hour(s))  URINE CULTURE     Status: None   Collection Time    Apr 15, 2014 10:53 AM      Result Value Ref Range Status   Specimen Description URINE, RANDOM   Final   Special Requests NONE   Final   Culture  Setup Time     Final   Value: 15-Apr-2014 19:46     Performed at [facility]     Final   Value: 3,000 COLONIES/ML     Performed at [facility]   Culture     Final   Value: INSIGNIFICANT GROWTH     Performed at [facility]   Report Status 04/05/2014 FINAL   Final  CULTURE, BLOOD (ROUTINE X 2)     Status: None   Collection Time    04-15-2014 10:55 AM      Result Value Ref Range Status   Specimen Description BLOOD PICC LINE   Final   Special Requests BOTTLES DRAWN AEROBIC AND ANAEROBIC 10CC   Final   Culture  Setup Time      Final   Value: 04/15/2014 18:58     Performed at [facility]   Culture     Final   Value: NO GROWTH 5 DAYS     Performed at [facility]   Report Status 04/09/2014 FINAL   Final  CULTURE, BLOOD (ROUTINE X 2)     Status: None   Collection Time    04-15-14 11:00 AM      Result Value Ref Range Status   Specimen Description BLOOD RIGHT HAND   Final   Special Requests BOTTLES DRAWN AEROBIC ONLY 10CC   Final   Culture  Setup Time     Final   Value: 04/15/2014 18:58     Performed at [facility]   Culture     Final   Value: NO GROWTH 5 DAYS     Performed at [facility]   Report Status 04/09/2014 FINAL   Final  MRSA PCR SCREENING     Status: None   Collection Time    04/04/14  5:32 PM      Result Value Ref Range Status   MRSA by PCR NEGATIVE  NEGATIVE Final   Comment:            The GeneXpert MRSA Assay (FDA  approved for NASAL specimens     only), is one component of a     comprehensive MRSA colonization     surveillance program. It is not     intended to diagnose MRSA     infection nor to guide or     monitor treatment for     MRSA infections.  URINE CULTURE     Status: None   Collection Time    04/10/14  5:17 PM      Result Value Ref Range Status   Specimen Description URINE, CATHETERIZED   Final   Special Requests NONE   Final   Culture  Setup Time     Final   Value: 04/11/2014 03:48     Performed at [facility]     Final   Value: >=100,000 COLONIES/ML     Performed at [facility]   Culture     Final   Value: YEAST     Performed at [facility]   Report Status 04/12/2014 FINAL   Final    Anti-infectives   Start     Dose/Rate Route Frequency Ordered Stop   04/15/14 1700  DAPTOmycin (CUBICIN) 500 mg in sodium chloride 0.9 % IVPB     500 mg 220 mL/hr over 30 Minutes Intravenous Every 48 hours 04/13/14 1639     04/15/14 1515  DAPTOmycin (CUBICIN) 500 mg in sodium chloride 0.9 % IVPB   Status:  Discontinued     500 mg 220 mL/hr over 30 Minutes Intravenous Every 48 hours 04/13/14 1516 04/13/14 1639   04/13/14 1515  DAPTOmycin (CUBICIN) 500 mg in sodium chloride 0.9 % IVPB  Status:  Discontinued     500 mg 220 mL/hr over 30 Minutes Intravenous Every 24 hours 04/13/14 1514 04/13/14 1516   04/11/14 1000  fluconazole (DIFLUCAN) 40 MG/ML suspension 100 mg     100 mg Oral Daily 04/11/14 0917 04/18/14 0959   04/11/14 1000  ciprofloxacin (CIPRO) 500 MG/5ML (10%) suspension 500 mg  Status:  Discontinued     500 mg Oral Daily 04/11/14 0917 04/12/14 1624   04/04/14 1000  ceFEPIme (MAXIPIME) 1 g in dextrose 5 % 50 mL IVPB  Status:  Discontinued     1 g 100 mL/hr over 30 Minutes Intravenous Every 24 hours 03/27/2014 1821 04/04/14 0931   04/04/14 1000  DAPTOmycin (CUBICIN) 530.5 mg in sodium chloride 0.9 % IVPB  Status:  Discontinued     6 mg/kg  88.4 kg 221.2 mL/hr over 30 Minutes Intravenous Every 24 hours 03/28/2014 1821 04/04/14 0138   04/16/2014 1230  ciprofloxacin (CIPRO) IVPB 400 mg     400 mg 200 mL/hr over 60 Minutes Intravenous  Once 04/13/2014 1225 04/06/2014 1423   04/12/2014 1200  cefTRIAXone (ROCEPHIN) 1 g in dextrose 5 % 50 mL IVPB  Status:  Discontinued     1 g 100 mL/hr over 30 Minutes Intravenous  Once 03/28/2014 1147 04/04/2014 1147   03/25/2014 1200  ceFEPIme (MAXIPIME) 2 g in dextrose 5 % 50 mL IVPB  Status:  Discontinued     2 g 100 mL/hr over 30 Minutes Intravenous Every 12 hours 03/25/2014 1148 03/31/2014 1225      Assessment: 83 YOF restarted on Daptomycin for T10/11 osteomyelitis now resuming gram negative coverage with Merrem per ID. Note patient seizure history with cefepime and this was considered in the decision. SCr 5.84, est CrCl~8. Not a candidate for HD/CRRT per renal.   Goal  of Therapy:  Clinical resolution of infection  Plan:  1. Merrem 500 mg IV q24 2. Follow-up renal plans and adjust if needed. 3. Follow-up cultures and duration of therapy.   Sloan Leiter, PharmD, BCPS Clinical Pharmacist [phone] 04/14/2014,3:39 PM

## 2014-04-14 NOTE — Progress Notes (Signed)
Subjective:  No acute events or seizure activity overnight. Still lethargic and arousable only to pain. Not opening eyes or following commands but continues to be responsive to painful stimuli. The grandson was informed of the results of the CT abdomen, xray, and uric acid levels. She was sent to MRI this morning and her dopplers were also done. No change on her activity from the previous day.   Objective: Vital signs in last 24 hours: Filed Vitals:   04/14/14 0026 04/14/14 0250 04/14/14 0412 04/14/14 0741  BP: 100/40 100/41 116/40 128/53  Pulse: 78 76 83 97  Temp:   98 F (36.7 C) 98.8 F (37.1 C)  TempSrc:   Oral Oral  Resp: 13 22 17 17   Height:      Weight:   95 kg (209 lb 7 oz)   SpO2: 91% 92% 94% 94%   Weight change:   Intake/Output Summary (Last 24 hours) at 04/14/14 1144 Last data filed at 04/14/14 1100  Gross per 24 hour  Intake   1790 ml  Output    625 ml  Net   1165 ml   Physical Examination: General: Somnolent   Eyes: Asleep Neck: Normal ROM. Neck supple.  Heart: Normal rate and regular rhythm.  Lungs: Breathing comfortably on RA. Anterior lung fields clear. No wheezing, ronchi, or rales.  Abdominal: Soft, non-distended, non-tender, minimal BS. No rebound or guarding.  Extremities: SCD's. Left ankle swelling and tenderness to palpation. Neurological: Somnolent. Not arousable to voice or touch. Withdraws to painful stimuli. Unable to follow commands or open eyes. Positive babinski b/l   Lab Results: Basic Metabolic Panel:  Recent Labs Lab 04/08/14 0420 04/09/14 0420  04/13/14 0430 04/14/14 0500  NA 144 143  < > 142 140  K 3.6* 3.8  < > 4.9 5.3  CL 109 108  < > 107 104  CO2 21 22  < > 18* 17*  GLUCOSE 128* 140*  < > 151* 199*  BUN 52* 55*  < > 106* 125*  CREATININE 4.71* 4.65*  < > 5.41* 5.84*  CALCIUM 10.8* 10.7*  < > 11.0* 10.3  MG 2.2 2.3  --   --   --   PHOS 3.8 3.3  < > 2.8 2.7  < > = values in this interval not displayed. Liver Function  Tests:  Recent Labs Lab 04/11/14 0500  04/13/14 0430 04/14/14 0500  AST  --   --   --  39*  ALT  --   --   --  24  ALKPHOS  --   --   --  68  BILITOT <0.2*  --   --  <0.2*  PROT  --   --   --  6.3  ALBUMIN 1.6*  < > 1.7* 1.5*  1.5*  < > = values in this interval not displayed.  Recent Labs Lab 04/12/14 0430  LIPASE 89*   CBC:  Recent Labs Lab 04/11/14 0500  04/13/14 0430 04/14/14 0500  WBC 10.8*  < > 11.1* 9.9  NEUTROABS 8.1*  --   --  7.1  HGB 6.8*  < > 8.9* 8.6*  HCT 20.7*  < > 26.2* 25.7*  MCV 85.9  < > 85.1 84.8  PLT 190  < > 205 208  < > = values in this interval not displayed. Cardiac Enzymes:  Recent Labs Lab 04/10/14 0500  CKTOTAL 26   CBG:  Recent Labs Lab 04/13/14 0727 04/13/14 0927 04/13/14 1256 04/13/14 1421  04/13/14 1949 04/14/14 0040  GLUCAP 153* 157* 156* 157* 171* 198*   Urinalysis:  Recent Labs Lab 04/10/14 0947  COLORURINE YELLOW  LABSPEC 1.018  PHURINE 5.5  GLUCOSEU NEGATIVE  HGBUR MODERATE*  BILIRUBINUR NEGATIVE  KETONESUR NEGATIVE  PROTEINUR 100*  UROBILINOGEN 0.2  NITRITE NEGATIVE  LEUKOCYTESUR LARGE*     Studies/Results: Ct Abdomen Pelvis Wo Contrast  04/13/2014   CLINICAL DATA:  Pancreatitis, renal insufficiency, 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  COMPARISON:  03/23/2014  FINDINGS: Limited exam without IV contrast and diffuse motion artifact.  Lower chest: Nodular bibasilar opacities noted, slightly worse suspicious for bibasilar pneumonia versus aspiration. Heart is enlarged. No pericardial effusion. No significant pleural effusion.  Abdomen: Feeding tube terminates in the distal stomach. Prior cholecystectomy noted. No gross biliary dilatation. There is interval improvement in the previously described strandy edema and fluid about the proximal  pancreatic head. Findings compatible with resolved pancreatitis. No peripancreatic fluid collection to suggest pseudocyst or abscess. No ductal dilatation appreciated. Kidneys demonstrate low-density cystic lesions bilaterally compatible with renal cysts. No renal obstruction or hydronephrosis.  Aortic atherosclerosis noted without aneurysm.  Negative for bowel obstruction, dilatation, or ileus. Normal appendix demonstrated.  No abdominal fluid collection, abscess, hemorrhage, or adenopathy.  Pelvis: Large amount of stool in the rectum. No pelvic fluid collection, hemorrhage, definite abscess, inguinal abnormality, or hernia. Bladder is collapsed by Foley catheter.  T10-11 osseous destructive process involving the disc space noted compatible with known spondylo discitis. Degenerative changes elsewhere in the thoracic and lumbar spine.  IMPRESSION: Limited exam with motion artifact.  Resolved edema and fluid about the proximal pancreas, compatible with improving pancreatitis.  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   On: 04/13/2014 18:27   Dg Ankle Complete Left  04/13/2014   CLINICAL DATA:  Left ankle and foot pain  EXAM: LEFT ANKLE COMPLETE - 3+ VIEW  COMPARISON:  04/13/2014  FINDINGS: Bones are osteopenic. Normal left ankle alignment without fracture. Distal tibia, fibula, talus and calcaneus appear intact. Vascular calcifications noted. Degenerative changes of the talonavicular articulation.  IMPRESSION: No acute osseous finding.   Electronically Signed   By: Daryll Brod M.D.   On: 04/13/2014 18:57   Mr Brain Wo Contrast  04/14/2014   CLINICAL DATA:  80 year old female with altered mental status. Abnormal EEG. Initial encounter.  EXAM: MRI HEAD WITHOUT CONTRAST  TECHNIQUE: Multiplanar, multiecho pulse sequences of the brain and surrounding structures were obtained  without intravenous contrast.  COMPARISON:  Brain MRI 04/04/2014.  Head CT 04/02/2014.  FINDINGS: Study is intermittently degraded by motion artifact despite repeated imaging attempts.  No restricted diffusion to suggest acute infarction. No midline shift, mass effect, evidence of mass lesion, ventriculomegaly, extra-axial collection or acute intracranial hemorrhage. Cervicomedullary junction and pituitary are within normal limits. Major intracranial vascular flow voids are stable. Stable gray and white matter signal in the brain. Tentorial calcifications thought responsible for the T2* foci on series 10, image 10. Essentially normal for age gray and white matter signal. Stable visualized upper cervical spine. Stable orbits, paranasal sinuses, mastoids, and scalp soft tissues.  IMPRESSION: Stable and essentially normal for age non contrast MRI appearance of the brain. Today's study is intermittently motion degraded.   Electronically Signed  By: Lars Pinks M.D.   On: 04/14/2014 10:49   Dg Foot Complete Left  04/13/2014   CLINICAL DATA:  Left ankle and foot pain  EXAM: LEFT FOOT - COMPLETE 3+ VIEW  COMPARISON:  04/13/2014, 08/11/2004  FINDINGS: Diffuse left foot soft tissue swelling on the lateral view. Bones are osteopenic. Degenerative changes of the left talonavicular articulation with joint space loss, sclerosis and subchondral cyst formation. Normal alignment without acute fracture.  IMPRESSION: Soft tissue swelling.  Midfoot osteoarthritis  No acute osseous finding or fracture   Electronically Signed   By: Daryll Brod M.D.   On: 04/13/2014 19:00   Medications: I have reviewed the patient's current medications. Scheduled Meds: . antiseptic oral rinse  15 mL Mouth Rinse q12n4p  . carvedilol  25 mg Per Tube BID WC  . chlorhexidine  15 mL Mouth Rinse BID  . cloNIDine  0.1 mg Transdermal Weekly  . [START ON 04/15/2014] DAPTOmycin (CUBICIN)  IV  500 mg Intravenous Q48H  . darbepoetin (ARANESP) injection -  NON-DIALYSIS  100 mcg Subcutaneous Q Tue-1800  . fluconazole  100 mg Oral Daily  . heparin subcutaneous  5,000 Units Subcutaneous 3 times per day  . hydrALAZINE  25 mg Oral 3 times per day  . insulin aspart  0-15 Units Subcutaneous 6 times per day  . sodium bicarbonate  1,300 mg Per Tube TID   Continuous Infusions: . feeding supplement (VITAL AF 1.2 CAL) 1,000 mL (04/13/14 1642)   PRN Meds:.acetaminophen (TYLENOL) oral liquid 160 mg/5 mL, levalbuterol, sodium chloride Assessment/Plan:  Acute Encephalopathy in setting of Non-convulsive Status Epilepticus & Metabolic Toxicity -  No recent seizure activity. Etiology thought to be due to cefepime neuro toxicity.  -Appreciate neurology recommendations -repeat MRI brain was completed today - stable and normal for age appearance of brain -EEG on 5/25 ---> no electrographic seizures but findings supportive of a moderately severe encephalopathy -Frequent neuro checks -Valproic acid 500 mg TID per tube discontinued on 5/27 by neurology. Concern for causing sedation -Avoid sedative medications  -Hold home cymbalta 60 mg daily  -Hold home norco 10-325 mg Q 6 hr PRN  -Palliative consult if family agrees   History of gout with left ankle pain and swelling  - Pt with left foot swelling and tenderness on exam. Pt was not on gout medications at home.  - xray left ankle- negative for osseous pathology -Obtain b/l doppler LE [place] -SQ heparin TID and SCD's for DVT Ppx -Consider colchicine 1.2 mg x 1 then 0.6 mg 1 hr later (no adjustment for CrCl <30)   Acute on Chronic Normocytic Anemia - Hg 8.9 near baseline Hg 9 with no hemodynamic instability or active bleeding. Pt received 2 pRBC's on 5/24 due to Hg 6.8.  Anemia panel 5/17 with reticulocytes (wnl), ferritin(253), iron (33 L), TIB (135 L), Iron sat (24%), folate (wnl), B12 (wnl). Etiology possibly due to autoimmune hemolytic anemia in setting of recent antibiotics (Coombs test +).  -Monitor for  bleeding -Monitor CBC  -F/U hemolytic work-up---> pathologist (Absolute neutrophilia with mild left shift in maturation, favoring reactive process) and technician smear review  -Obtain fibrinogen levels -Transfuse if Hg <7    -Aranesp weekly (Tues) per renal  -Consider corticosteroids if worsens   Nonoliguric AKI on CKD Stage 4 - Cr 5.41, above 4.29 on admission and above baseline 1.9. Etiology likely due to intrinsic renal (FeNa 4%) due to cefepime toxicity and very mild rhabdomyolysis from status epiliepticus (CK initially elevated).   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  Diagnosed Jan 2015. Pt has received 2 weeks of IV ceftriaxone and vancomycin; doxycyline and amoxicillin; and now IV cefepime and daptomycin since 4/29 for 6 weeks. CT revealing destructive process at T10-T11 secondary to osteomyelitis and discitis. PICC line placed about 3 weeks ago.  -Appreciate ID recommendations -ID to start daptomycin and possible ceftriaxone or meropenem if tolerates and ok with neurology  -Obtain CK level -Follow leukocytosis and fever curve -Outpatient ID F/U scheudled 02/24/17  Complicated Cystitis - UA with large leukocytes, pyuria (too numerous), few bacteriuria, and many yeast. Urine cultures with  > 100 K yeast -No antibiotics indicated -Continue flucanazole 100 mg Day 3/7  -Keep foley catheter   Acute Non-Biliary Pancreatitis - Repeat lipase levels improve (89 from 1219 from admission).  Etiology unclear, possibly due to uncontrolled hypercalcemia.   -Appreciate GI recommendations -Diet NPO, feeding supplements per tube -CT abd/pelvis with oral contrast showed resolution of the edema about proximal pancreas - pancreatitis improving  Chronic Hypercalcemia in setting of secondary  hyperparathyroidism with vitamin D insufficiency - Corrected calcium high 12.8.  Last PTH with 163.8 (H), vitamin D25-OH (23 L) on 03/27/2014.  Also questionable sarcoidosis per pulmonology.  -Pt received  IV pamidronate x 1 on 5/25  -F/U PTH levels  Chronic Grade 1 Diastolic CHF -  Last 2D -echo on 12/17/13 with EF 60-65% and grade 1 diastolic dysfunction.   -Hold home torsemide 20 mg daily  -Hold home imdur 15 mg daily  -Monitor daily weights and strict I & O's   Hypertension - Currently hypertensive.    -Continue clonidine 0.1 mg patch weekly (at home on clonidine 0.1 mg BID) -Continue hydralazine 25 mg TID (at home on 50 mg QID) -Resume home carvedilol 25 mg BID  -Hold home torsemide 20 mg daily  -Hold home imdur 15 mg daily  -IV labetolol 10 mg PRN  Non-insulin dependent Type II DM - Last A1c of 6 on 5/16. Pt does not appear to be on medications at home.  -Sensitive SSI  -CBG monitoring Q 4 hrs while NPO   Hypokalemia - Resolved.  Magnesium levels normal. Etiology due to decreased PO intake. -Replete with caution in setting of CKD -Monitor BMP  Diet: NPO, tube feeding  DVT Ppx: SQ Heparin TID, SCD's  Code: Full  Dispo: SNF once medically stable   The patient does have a current PCP (No Pcp Per Patient) and does need an [facility] hospital follow-up appointment after discharge.  The patient does have transportation limitations that hinder transportation to clinic appointments.  .Services Needed at time of discharge: Y = Yes, Blank = No PT:   OT:   RN:   Equipment:   Other:     LOS: 11 days   [name] [name], Med Student 04/14/2014, 11:44 AM

## 2014-04-14 NOTE — Progress Notes (Signed)
INFECTIOUS DISEASE PROGRESS NOTE  ID: [name] [name] is a 80 y.o. female with  Principal Problem:   Seizures Active Problems:   Osteomyelitis of thoracic spine   Acute encephalopathy   Pancreatitis, acute   Acute on chronic renal failure   Acute on chronic kidney failure   Non-convulsive status epilepticus   Protein-calorie malnutrition, severe   Anemia of chronic disease  Subjective: No response  Abtx:  Anti-infectives   Start     Dose/Rate Route Frequency Ordered Stop   04/15/14 1700  DAPTOmycin (CUBICIN) 500 mg in sodium chloride 0.9 % IVPB     500 mg 220 mL/hr over 30 Minutes Intravenous Every 48 hours 04/13/14 1639     04/15/14 1515  DAPTOmycin (CUBICIN) 500 mg in sodium chloride 0.9 % IVPB  Status:  Discontinued     500 mg 220 mL/hr over 30 Minutes Intravenous Every 48 hours 04/13/14 1516 04/13/14 1639   04/13/14 1515  DAPTOmycin (CUBICIN) 500 mg in sodium chloride 0.9 % IVPB  Status:  Discontinued     500 mg 220 mL/hr over 30 Minutes Intravenous Every 24 hours 04/13/14 1514 04/13/14 1516   04/11/14 1000  fluconazole (DIFLUCAN) 40 MG/ML suspension 100 mg     100 mg Oral Daily 04/11/14 0917 04/18/14 0959   04/11/14 1000  ciprofloxacin (CIPRO) 500 MG/5ML (10%) suspension 500 mg  Status:  Discontinued     500 mg Oral Daily 04/11/14 0917 04/12/14 1624   04/04/14 1000  ceFEPIme (MAXIPIME) 1 g in dextrose 5 % 50 mL IVPB  Status:  Discontinued     1 g 100 mL/hr over 30 Minutes Intravenous Every 24 hours 2014-04-10 1821 04/04/14 0931   04/04/14 1000  DAPTOmycin (CUBICIN) 530.5 mg in sodium chloride 0.9 % IVPB  Status:  Discontinued     6 mg/kg  88.4 kg 221.2 mL/hr over 30 Minutes Intravenous Every 24 hours 2014-04-10 1821 04/04/14 0138   Apr 10, 2014 1230  ciprofloxacin (CIPRO) IVPB 400 mg     400 mg 200 mL/hr over 60 Minutes Intravenous  Once 04-10-2014 1225 04-10-2014 1423   04-10-14 1200  cefTRIAXone (ROCEPHIN) 1 g in dextrose 5 % 50 mL IVPB  Status:  Discontinued     1 g 100  mL/hr over 30 Minutes Intravenous  Once 04/10/2014 1147 Apr 10, 2014 1147   04/10/2014 1200  ceFEPIme (MAXIPIME) 2 g in dextrose 5 % 50 mL IVPB  Status:  Discontinued     2 g 100 mL/hr over 30 Minutes Intravenous Every 12 hours 2014/04/10 1148 04/10/14 1225      Medications:  Scheduled: . antiseptic oral rinse  15 mL Mouth Rinse q12n4p  . carvedilol  25 mg Per Tube BID WC  . chlorhexidine  15 mL Mouth Rinse BID  . cloNIDine  0.1 mg Transdermal Weekly  . [START ON 04/15/2014] DAPTOmycin (CUBICIN)  IV  500 mg Intravenous Q48H  . darbepoetin (ARANESP) injection - NON-DIALYSIS  100 mcg Subcutaneous Q Tue-1800  . fluconazole  100 mg Oral Daily  . heparin subcutaneous  5,000 Units Subcutaneous 3 times per day  . hydrALAZINE  25 mg Oral 3 times per day  . insulin aspart  0-15 Units Subcutaneous 6 times per day  . sodium bicarbonate  1,300 mg Per Tube TID    Objective: Vital signs in last 24 hours: Temp:  [97.9 F (36.6 C)-98.9 F (37.2 C)] 97.9 F (36.6 C) (05/27 1200) Pulse Rate:  [76-97] 96 (05/27 1200) Resp:  [6-22] 14 (05/27 1200)  BP: (100-180)/(40-73) 156/50 mmHg (05/27 1200) SpO2:  [91 %-96 %] 96 % (05/27 1200) Weight:  [95 kg (209 lb 7 oz)] 95 kg (209 lb 7 oz) (05/27 0412)   General appearance: no distress Resp: clear to auscultation bilaterally Cardio: regular rate and rhythm GI: normal findings: bowel sounds normal and soft, non-tender Neurologic: Mental status: does not arouse  Lab Results  Recent Labs  04/13/14 0430 04/14/14 0500  WBC 11.1* 9.9  HGB 8.9* 8.6*  HCT 26.2* 25.7*  NA 142 140  K 4.9 5.3  CL 107 104  CO2 18* 17*  BUN 106* 125*  CREATININE 5.41* 5.84*   Liver Panel  Recent Labs  04/13/14 0430 04/14/14 0500  PROT  --  6.3  ALBUMIN 1.7* 1.5*  1.5*  AST  --  39*  ALT  --  24  ALKPHOS  --  68  BILITOT  --  <0.2*  BILIDIR  --  <0.2  IBILI  --  NOT CALCULATED   Sedimentation Rate  Recent Labs  04/12/14 0430  ESRSEDRATE 130*   C-Reactive  Protein  Recent Labs  04/12/14 0430  CRP 12.5*    Microbiology: Recent Results (from the past 240 hour(s))  MRSA PCR SCREENING     Status: None   Collection Time    04/04/14  5:32 PM      Result Value Ref Range Status   MRSA by PCR NEGATIVE  NEGATIVE Final   Comment:            The GeneXpert MRSA Assay (FDA     approved for NASAL specimens     only), is one component of a     comprehensive MRSA colonization     surveillance program. It is not     intended to diagnose MRSA     infection nor to guide or     monitor treatment for     MRSA infections.  URINE CULTURE     Status: None   Collection Time    04/10/14  5:17 PM      Result Value Ref Range Status   Specimen Description URINE, CATHETERIZED   Final   Special Requests NONE   Final   Culture  Setup Time     Final   Value: 04/11/2014 03:48     Performed at [place]     Final   Value: >=100,000 COLONIES/ML     Performed at [facility]   Culture     Final   Value: YEAST     Performed at [facility]   Report Status 04/12/2014 FINAL   Final    Studies/Results: Ct Abdomen Pelvis Wo Contrast  04/13/2014   CLINICAL DATA:  Pancreatitis, renal insufficiency, 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  COMPARISON:  04/05/2014  FINDINGS: Limited exam without IV contrast and diffuse motion artifact.  Lower chest: Nodular bibasilar opacities noted, slightly worse suspicious for bibasilar pneumonia versus aspiration. Heart is enlarged. No pericardial effusion. No significant pleural effusion.  Abdomen: Feeding tube terminates in the distal stomach. Prior cholecystectomy noted. No gross biliary dilatation. There is interval improvement in the previously described strandy edema and fluid about the proximal pancreatic head. Findings  compatible with resolved pancreatitis. No peripancreatic fluid collection to suggest pseudocyst or abscess. No ductal dilatation appreciated. Kidneys demonstrate low-density  cystic lesions bilaterally compatible with renal cysts. No renal obstruction or hydronephrosis.  Aortic atherosclerosis noted without aneurysm.  Negative for bowel obstruction, dilatation, or ileus. Normal appendix demonstrated.  No abdominal fluid collection, abscess, hemorrhage, or adenopathy.  Pelvis: Large amount of stool in the rectum. No pelvic fluid collection, hemorrhage, definite abscess, inguinal abnormality, or hernia. Bladder is collapsed by Foley catheter.  T10-11 osseous destructive process involving the disc space noted compatible with known spondylo discitis. Degenerative changes elsewhere in the thoracic and lumbar spine.  IMPRESSION: Limited exam with motion artifact.  Resolved edema and fluid about the proximal pancreas, compatible with improving pancreatitis.  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   On: 04/13/2014 18:27   Dg Ankle Complete Left  04/13/2014   CLINICAL DATA:  Left ankle and foot pain  EXAM: LEFT ANKLE COMPLETE - 3+ VIEW  COMPARISON:  04/13/2014  FINDINGS: Bones are osteopenic. Normal left ankle alignment without fracture. Distal tibia, fibula, talus and calcaneus appear intact. Vascular calcifications noted. Degenerative changes of the talonavicular articulation.  IMPRESSION: No acute osseous finding.   Electronically Signed   By: Daryll Brod M.D.   On: 04/13/2014 18:57   Mr Brain Wo Contrast  04/14/2014   CLINICAL DATA:  80 year old female with altered mental status. Abnormal EEG. Initial encounter.  EXAM: MRI HEAD WITHOUT CONTRAST  TECHNIQUE: Multiplanar, multiecho pulse sequences of the brain and surrounding structures were obtained without intravenous contrast.   COMPARISON:  Brain MRI 04/04/2014.  Head CT 04/17/2014.  FINDINGS: Study is intermittently degraded by motion artifact despite repeated imaging attempts.  No restricted diffusion to suggest acute infarction. No midline shift, mass effect, evidence of mass lesion, ventriculomegaly, extra-axial collection or acute intracranial hemorrhage. Cervicomedullary junction and pituitary are within normal limits. Major intracranial vascular flow voids are stable. Stable gray and white matter signal in the brain. Tentorial calcifications thought responsible for the T2* foci on series 10, image 10. Essentially normal for age gray and white matter signal. Stable visualized upper cervical spine. Stable orbits, paranasal sinuses, mastoids, and scalp soft tissues.  IMPRESSION: Stable and essentially normal for age non contrast MRI appearance of the brain. Today's study is intermittently motion degraded.   Electronically Signed   By: Lars Pinks M.D.   On: 04/14/2014 10:49   Dg Foot Complete Left  04/13/2014   CLINICAL DATA:  Left ankle and foot pain  EXAM: LEFT FOOT - COMPLETE 3+ VIEW  COMPARISON:  04/13/2014, 08/11/2004  FINDINGS: Diffuse left foot soft tissue swelling on the lateral view. Bones are osteopenic. Degenerative changes of the left talonavicular articulation with joint space loss, sclerosis and subchondral cyst formation. Normal alignment without acute fracture.  IMPRESSION: Soft tissue swelling.  Midfoot osteoarthritis  No acute osseous finding or fracture   Electronically Signed   By: Daryll Brod M.D.   On: 04/13/2014 19:00     Assessment/Plan: Discitis/Osteo  Aspirate January (-) on anbx, vanco/ceftriaxone  Repeat MRI 4-24- worsening of osteo/discitis T10-11. Started on Dapto/cefepime 5-4, stopped 5-16 Encephalopathy, seizures (due to cefepime)  ARF  Pancreatitis (lipase 1219)  Total days of antibiotics: 1 dapto  Will resume her gram negative coverage with IV merrem.  Would ask neuro to comment on  her status, need to address EOL?         [facility] Infect[facility] (pager) [phone] [email or web address] 04/14/2014, 2:41 PM  LOS: 11 days   **Disclaimer: This note may have been dictated with voice recognition software. Similar sounding words can inadvertently be transcribed and this note may contain transcription errors which may not have been corrected upon publication of note.**

## 2014-04-14 NOTE — Progress Notes (Signed)
  I have seen and examined the patient, and reviewed the daily progress note by Jori Moll L. [place], [place] [address] and discussed the care of the patient with them. Please see my progress note from 04/14/2014 for further details regarding assessment and plan.    Signed:  Juluis Mire, MD 04/14/2014, 6:08 PM

## 2014-04-14 NOTE — Progress Notes (Signed)
Subjective: No change in extent of responsiveness with minimal reaction to external stimuli. No seizure activity has been reported. He said no abnormal movements of extremities.  Objective: Current vital signs: BP 156/50  Pulse 96  Temp(Src) 97.9 F (36.6 C) (Oral)  Resp 14  Ht 5\' 2"  (1.575 m)  Wt 95 kg (209 lb 7 oz)  BMI 38.30 kg/m2  SpO2 96%  Neurologic Exam: Respirations were of normal rate and regular. Patient responded only minimally to external tactile stimuli with partial eye-opening bilaterally. She did not follow any commands. She also did not blink to visual threat. Pupils were equal and reacted normally to light. Extraocular movements were intact with oculocephalic maneuvers. Face was symmetrical with no signs of focal weakness. Muscle tone was flaccid throughout. Patient had no abnormal posturing. Deep tendon reflexes are 1+ in upper extremities and absent at knees and ankles. Plantar responses were extensor bilaterally.  Medications: I have reviewed the patient's current medications.  Assessment/Plan: Continued severe encephalopathy of unclear etiology. MRI of the brain earlier today showed no change from previous study on 04/04/2014 with no signs of cerebral infarction or mass lesion. There was also no ventriculomegaly no extra-axial fluid collection. Metabolic/toxic encephalopathy suspected.  I will discontinue Depakote at this point since EEG showed no indications of nonconvulsive epileptic activity. It's possible that Depakote is a contributor to patient's encephalopathic state. If so, she will hopefully improve clinically.  We will continue to follow patient with you.  C.R. Nicole Kindred, MD [facility] [phone]  04/14/2014  1:17 PM

## 2014-04-14 NOTE — Care Management Note (Signed)
    Page 1 of 1   04/14/2014     1:08:22 PM CARE MANAGEMENT NOTE 04/14/2014  Patient:  [name] [name], [name] [name]   Account Number:  [id number]  Date Initiated:  04/06/2014  Documentation initiated by:  Sandi Mariscal  Subjective/Objective Assessment:   neuro changes; in ICU     Action/Plan:   living at SNF PTA   Anticipated DC Date:  04/11/2014   Anticipated DC Plan:  [place]  In-house referral  Clinical Social Worker         Status of service:  In process, will continue to follow Medicare Important Message given?  YES (If response is "NO", the following Medicare IM given date fields will be blank) Date Medicare IM given:  04/14/2014 Date Additional Medicare IM given:    Per UR Regulation:  Reviewed for med. necessity/level of care/duration of stay  If discussed at [place] of Stay Meetings, dates discussed:   04/08/2014    Comments:  04/13/14  [place] RN MSN BSN CCM Transferred to [place].  CSW following for transfer to SNF when medically stable.

## 2014-04-14 NOTE — Progress Notes (Signed)
[place] updated clinicals so they can submit for insurance auth if needed. CSW following. Understand daughter would like to take pt home with home care if she feels she can manage pt's needs. At this time, plan is likely discharge back to [facility].   Ky Barban, MSW, [facility] Clinical Social Worker [phone]

## 2014-04-14 NOTE — Progress Notes (Addendum)
Subjective:  Pt seen and examined in AM. No seizure activity overnight. Still lethargic and barely arousable. Not opening eyes or following commands but continues to be responsive to painful stimuli. Left ankle still swollen and painful.    Objective: Vital signs in last 24 hours: Filed Vitals:   04/14/14 0026 04/14/14 0250 04/14/14 0412 04/14/14 0741  BP: 100/40 100/41 116/40 128/53  Pulse: 78 76 83 97  Temp:   98 F (36.7 C) 98.8 F (37.1 C)  TempSrc:   Oral Oral  Resp: _0 Height:      Weight:   95 kg (209 lb 7 oz)   SpO2: 91% 92% 94% 94%   Weight change:   Intake/Output Summary (Last 24 hours) at 04/14/14 1233 Last data filed at 04/14/14 1100  Gross per 24 hour  Intake   1790 ml  Output    625 ml  Net   1165 ml   Physical Examination: General: Somnolent   Eyes: Asleep Neck: Normal ROM. Neck supple.  Heart: Normal rate and regular rhythm.  Lungs: Breathing comfortably on RA. Ronchi in anterior fields. No wheezing, ronchi, or rales.  Abdominal: Soft, non-distended, non-tender, minimal BS. No rebound or guarding.  Extremities: SCD's. Left ankle swelling and tenderness to palpation. Neurological: Somnolent. Not arousable to voice or touch. Withdraws to painful stimuli. Unable to follow commands or open eyes. Positive babinski b/l   Lab Results: Basic Metabolic Panel:  Recent Labs Lab 04/08/14 0420 04/09/14 0420  04/13/14 0430 04/14/14 0500  NA 144 143  < > 142 140  K 3.6* 3.8  < > 4.9 5.3  CL 109 108  < > 107 104  CO2 21 22  < > 18* 17*  GLUCOSE 128* 140*  < > 151* 199*  BUN 52* 55*  < > 106* 125*  CREATININE 4.71* 4.65*  < > 5.41* 5.84*  CALCIUM 10.8* 10.7*  < > 11.0* 10.3  MG 2.2 2.3  --   --   --   PHOS 3.8 3.3  < > 2.8 2.7  < > = values in this interval not displayed. Liver Function Tests:  Recent Labs Lab 04/11/14 0500  04/13/14 0430 04/14/14 0500  AST  --   --   --  39*  ALT  --   --   --  24  ALKPHOS  --   --   --  68    BILITOT <0.2*  --   --  <0.2*  PROT  --   --   --  6.3  ALBUMIN 1.6*  < > 1.7* 1.5*  1.5*  < > = values in this interval not displayed.  Recent Labs Lab 04/12/14 0430  LIPASE 89*   CBC:  Recent Labs Lab 04/11/14 0500  04/13/14 0430 04/14/14 0500  WBC 10.8*  < > 11.1* 9.9  NEUTROABS 8.1*  --   --  7.1  HGB 6.8*  < > 8.9* 8.6*  HCT 20.7*  < > 26.2* 25.7*  MCV 85.9  < > 85.1 84.8  PLT 190  < > 205 208  < > = values in this interval not displayed. Cardiac Enzymes:  Recent Labs Lab 04/10/14 0500  CKTOTAL 26   CBG:  Recent Labs Lab 04/13/14 0727 04/13/14 0927 04/13/14 1256 04/13/14 1421 04/13/14 1949 04/14/14 0040  GLUCAP 153* 157* 156* 157* 171* 198*   Urinalysis:  Recent Labs Lab 04/10/14 [place]  LABSPEC  1.018  PHURINE 5.5  GLUCOSEU NEGATIVE  HGBUR MODERATE*  BILIRUBINUR NEGATIVE  KETONESUR NEGATIVE  PROTEINUR 100*  UROBILINOGEN 0.2  NITRITE NEGATIVE  LEUKOCYTESUR LARGE*     Studies/Results: Ct Abdomen Pelvis Wo Contrast  04/13/2014   CLINICAL DATA:  Pancreatitis, renal insufficiency, 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  COMPARISON:  03/30/2014  FINDINGS: Limited exam without IV contrast and diffuse motion artifact.  Lower chest: Nodular bibasilar opacities noted, slightly worse suspicious for bibasilar pneumonia versus aspiration. Heart is enlarged. No pericardial effusion. No significant pleural effusion.  Abdomen: Feeding tube terminates in the distal stomach. Prior cholecystectomy noted. No gross biliary dilatation. There is interval improvement in the previously described strandy edema and fluid about the proximal pancreatic head. Findings compatible with resolved pancreatitis. No peripancreatic fluid collection to suggest pseudocyst or abscess. No ductal dilatation  appreciated. Kidneys demonstrate low-density cystic lesions bilaterally compatible with renal cysts. No renal obstruction or hydronephrosis.  Aortic atherosclerosis noted without aneurysm.  Negative for bowel obstruction, dilatation, or ileus. Normal appendix demonstrated.  No abdominal fluid collection, abscess, hemorrhage, or adenopathy.  Pelvis: Large amount of stool in the rectum. No pelvic fluid collection, hemorrhage, definite abscess, inguinal abnormality, or hernia. Bladder is collapsed by Foley catheter.  T10-11 osseous destructive process involving the disc space noted compatible with known spondylo discitis. Degenerative changes elsewhere in the thoracic and lumbar spine.  IMPRESSION: Limited exam with motion artifact.  Resolved edema and fluid about the proximal pancreas, compatible with improving pancreatitis.  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   On: 04/13/2014 18:27   Dg Ankle Complete Left  04/13/2014   CLINICAL DATA:  Left ankle and foot pain  EXAM: LEFT ANKLE COMPLETE - 3+ VIEW  COMPARISON:  04/13/2014  FINDINGS: Bones are osteopenic. Normal left ankle alignment without fracture. Distal tibia, fibula, talus and calcaneus appear intact. Vascular calcifications noted. Degenerative changes of the talonavicular articulation.  IMPRESSION: No acute osseous finding.   Electronically Signed   By: Daryll Brod M.D.   On: 04/13/2014 18:57   Mr Brain Wo Contrast  04/14/2014   CLINICAL DATA:  80 year old female with altered mental status. Abnormal EEG. Initial encounter.  EXAM: MRI HEAD WITHOUT CONTRAST  TECHNIQUE: Multiplanar, multiecho pulse sequences of the brain and surrounding structures were obtained without intravenous contrast.  COMPARISON:  Brain MRI 04/04/2014.  Head CT 03/30/2014.  FINDINGS: Study is intermittently degraded by motion artifact despite  repeated imaging attempts.  No restricted diffusion to suggest acute infarction. No midline shift, mass effect, evidence of mass lesion, ventriculomegaly, extra-axial collection or acute intracranial hemorrhage. Cervicomedullary junction and pituitary are within normal limits. Major intracranial vascular flow voids are stable. Stable gray and white matter signal in the brain. Tentorial calcifications thought responsible for the T2* foci on series 10, image 10. Essentially normal for age gray and white matter signal. Stable visualized upper cervical spine. Stable orbits, paranasal sinuses, mastoids, and scalp soft tissues.  IMPRESSION: Stable and essentially normal for age non contrast MRI appearance of the brain. Today's study is intermittently motion degraded.   Electronically Signed   By: Lars Pinks M.D.   On: 04/14/2014 10:49   Dg Foot Complete Left  04/13/2014   CLINICAL DATA:  Left  ankle and foot pain  EXAM: LEFT FOOT - COMPLETE 3+ VIEW  COMPARISON:  04/13/2014, 08/11/2004  FINDINGS: Diffuse left foot soft tissue swelling on the lateral view. Bones are osteopenic. Degenerative changes of the left talonavicular articulation with joint space loss, sclerosis and subchondral cyst formation. Normal alignment without acute fracture.  IMPRESSION: Soft tissue swelling.  Midfoot osteoarthritis  No acute osseous finding or fracture   Electronically Signed   By: Daryll Brod M.D.   On: 04/13/2014 19:00   Medications: I have reviewed the patient's current medications. Scheduled Meds: . antiseptic oral rinse  15 mL Mouth Rinse q12n4p  . carvedilol  25 mg Per Tube BID WC  . chlorhexidine  15 mL Mouth Rinse BID  . cloNIDine  0.1 mg Transdermal Weekly  . [START ON 04/15/2014] DAPTOmycin (CUBICIN)  IV  500 mg Intravenous Q48H  . darbepoetin (ARANESP) injection - NON-DIALYSIS  100 mcg Subcutaneous Q Tue-1800  . fluconazole  100 mg Oral Daily  . heparin subcutaneous  5,000 Units Subcutaneous 3 times per day  .  hydrALAZINE  25 mg Oral 3 times per day  . insulin aspart  0-15 Units Subcutaneous 6 times per day  . sodium bicarbonate  1,300 mg Per Tube TID   Continuous Infusions: . feeding supplement (VITAL AF 1.2 CAL) 1,000 mL (04/13/14 1642)   PRN Meds:.acetaminophen (TYLENOL) oral liquid 160 mg/5 mL, levalbuterol, sodium chloride Assessment/Plan:  Acute Encephalopathy in setting of Non-convulsive Status Epilepticus & Metabolic Toxicity -  No recent seizure activity. Last EEG on 5/25.  Etiology thought to be due to cefepime neuro toxicity.  -Appreciate neurology recommendations -Obtain repeat MRI brain today ---> normal -Frequent neuro checks -D/C valproic acid 500 mg TID per tube per neurology  -Avoid sedative medications  -Hold home cymbalta 60 mg daily and norco 10-325 mg Q 6 hr PRN  -Consider LP if continues to unrespond  -Obtain PCT level -IV daptomycin and meropenem per ID for osteomyelitis  -Palliative consult if family agrees   Nonoliguric AKI on CKD Stage 4 - Worsening Cr 5.84, above 4.29 on admission and above baseline 1.9. Etiology likely due to intrinsic renal (FeNa 4%) due to cefepime toxicity and very mild rhabdomyolysis from status epiliepticus (CK initially elevated).   -Appreciate nephrology recommendations -Avoid nephrotoxins  -Hold home torsemide 20 mg daily  -Give IV lasix 20 mg x 1  -Monitor BMP  -Keep foley catheter  -No HD at this time per nephrology   Metabolic Acidosis - Bicarbonate 17 below baseline. Etiology likely due to acute renal failure.  -Start sodium bicarbonate 1300 mg TID  Possible Bibasilar Pneumonia -  Pt afebrile with no cough or leukocytosis. CT ab on 5/26 with worsening nodular bibasilar opacities -Obtain 2-view CXR -Repeat pro-BNP levels -IV lasix 20 mg x 1  -Currently on IV daptomycin and meropenem per ID for osteomyelitis   Destructive Osteomyelitis of T10-T11 - ESR & CRP elevated from admission.  Diagnosed Jan 2015. Pt has received 2 weeks  of IV ceftriaxone and vancomycin; doxycyline and amoxicillin; and now IV cefepime and daptomycin since 5/4 -5/16. CT revealing destructive process at T10-T11 secondary to osteomyelitis and discitis. PICC line placed about 3 weeks ago.  -Appreciate ID recommendations -Continue IV daptomycin (started 5/26) and start IV meropenem   -Obtain CK level -Follow leukocytosis and fever curve -Monitor PICC line for signs of infection -Outpatient ID F/U scheudled 04/26/14  History of gout with left ankle pain and swelling  - Pt with left foot swelling  and tenderness on exam. Pt was not on gout medications at home.  -Obtain xray left ankle ---> soft tissue swelling, midfoot OA, no acute fracture -Obtain b/l doppler LE [place] ---> no DVT -SQ heparin TID and SCD's for DVT Ppx -Obtain uric acid level --> 8.9 -Consider colchicine 1.2 mg x 1 then 0.6 mg 1 hr later (no adjustment for CrCl <30),  CrCl currently 8.1   Acute on Chronic Normocytic Anemia - Hg 8.6 near baseline Hg 9 with no hemodynamic instability or active bleeding. Pt received 2 pRBC's on 5/24 due to Hg 6.8.  Anemia panel 5/17 with reticulocytes (wnl), ferritin(253), iron (33 L), TIB (135 L), Iron sat (24%), folate (wnl), B12 (wnl). Etiology possibly due to autoimmune hemolytic anemia in setting of recent antibiotics (Coombs test +).  -Monitor for bleeding -Monitor CBC  -F/U hemolytic work-up---> technician smear review (burr cells, mild left shift)  -Obtain fibrinogen levels --> >800 (H), not suggestive of DIC -Transfuse if Hg <7    -Aranesp weekly (Tuesdays) per renal  -Consider corticosteroids if worsens  Hypoalbuminemia - Pt with low albumin levels in setting acute renal failure and infection.  -Obtain nutrition consult  Complicated Cystitis - UA with large leukocytes, pyuria (too numerous), few bacteriuria, and many yeast. Urine cultures with  > 100 K yeast -No antibiotics indicated -Continue flucanazole 100 mg Day 4/7  -Keep foley catheter     Acute Non-Biliary Pancreatitis - Improved/resolved.  Etiology unclear, possibly due to uncontrolled hypercalcemia.   -Appreciate GI recommendations -Diet NPO, feeding supplements per tube -Obtain CT abd/pelvis with oral contrast --> resolved edema and fluid about the proximal pancreas  Chronic Hypercalcemia in setting of secondary hyperparathyroidism with vitamin D insufficiency - Corrected calcium high 12.3.  Last PTH with 163.8 (H), vitamin D25-OH (23 L) on 04/18/2014.  Also questionable sarcoidosis per pulmonology. Pt received  IV pamidronate x 1 on 5/25  -F/U PTH levels ---> 241.9  Chronic Grade 1 Diastolic CHF -  Last 2D -echo on 12/17/13 with EF 60-65% and grade 1 diastolic dysfunction.   -Hold home torsemide 20 mg daily  -Hold home imdur 15 mg daily  -IV lasix 20 mg x 1  -Obtain repeat pro-BNP and 2-view CXR -Monitor daily weights and strict I & O's   Hypertension - Currently normotensive.    -Continue clonidine 0.1 mg patch weekly (at home on clonidine 0.1 mg BID) -Continue hydralazine 25 mg TID (at home on 50 mg QID) -Continue home carvedilol 25 mg BID  -Hold home torsemide 20 mg daily  -Hold home imdur 15 mg daily   Constipation - CT abd on 5/26 with large amount of stool in the rectum. -Miralax daily   Non-insulin dependent Type II DM - Last A1c of 6 on 5/16. Pt does not appear to be on medications at home.  -Sensitive SSI  -CBG monitoring Q 4 hrs while NPO   Hypokalemia - Resolved.  Magnesium levels normal. Etiology due to decreased PO intake. -Replete with caution in setting of CKD -Monitor BMP  Diet: NPO, tube feeding  DVT Ppx: SQ Heparin TID, SCD's  Code: Full  Dispo: SNF once medically stable   The patient does have a current PCP (No Pcp Per Patient) and does need an [facility] hospital follow-up appointment after discharge.  The patient does have transportation limitations that hinder transportation to clinic appointments.  .Services Needed at time of discharge: Y  = Yes, Blank = No PT:   OT:   RN:   Equipment:  Other:     LOS: 11 days   Juluis Mire, MD 04/14/2014, 12:33 PM

## 2014-04-14 NOTE — Progress Notes (Signed)
Pt has a bed on 2w14, called and gave report to receiving rn, pt has been transferred.------Braxen Dobek, rn

## 2014-04-14 NOTE — Progress Notes (Signed)
VASCULAR LAB PRELIMINARY  PRELIMINARY  PRELIMINARY  PRELIMINARY  Bilateral lower extremity venous duplex  completed.    Preliminary report:  Bilateral:  No evidence of DVT, superficial thrombosis, or Baker's Cyst.    Nani Ravens, RVT 04/14/2014, 4:11 PM

## 2014-04-14 NOTE — Progress Notes (Signed)
    Day 11 of stay      Patient name: [name] [name]  Medical record number: [id number]  Date of birth: 09/23/1930  Patient same as before.   Filed Vitals:   04/14/14 0412 04/14/14 0741 04/14/14 1200 04/14/14 1614  BP: 116/40 128/53 156/50 120/48  Pulse: 83 97 96 98  Temp: 98 F (36.7 C) 98.8 F (37.1 C) 97.9 F (36.6 C)   TempSrc: Oral Oral Oral   Resp: 17 17 14 17   Height:      Weight: 209 lb 7 oz (95 kg)     SpO2: 94% 94% 96% 97%   Exam significant for some crackles/rhonchi in lungs, systolic murmur and for 2+ edema LE bilateral. Neuro exam unchanged from yesterday.   Recent Labs Lab 04/08/14 0420 04/09/14 0420 04/10/14 0500 04/11/14 0500 04/12/14 0430 04/13/14 0430 04/14/14 0500  NA 144 143 144 143 141 142 140  K 3.6* 3.8 4.0 4.1 4.3 4.9 5.3  CL 109 108 109 110 108 107 104  CO2 21 22 20 19 19  18* 17*  GLUCOSE 128* 140* 175* 169* 139* 151* 199*  BUN 52* 55* 59* 74* 88* 106* 125*  CREATININE 4.71* 4.65* 4.59* 4.78* 5.15* 5.41* 5.84*  CALCIUM 10.8* 10.7* 10.4 10.4 10.9* 11.0* 10.3  MG 2.2 2.3  --   --   --   --   --   PHOS 3.8 3.3 2.6 1.7* 2.8 2.8 2.7    Recent Labs Lab 04/12/14 0430 04/13/14 0430 04/14/14 0500  HGB 9.5* 8.9* 8.6*  HCT 27.7* 26.2* 25.7*  WBC 13.4* 11.1* 9.9  PLT 205 205 208    Assessment and Plan    CHF - Positive balance more than 10 litre this admission. Weight gain 195 lbs to 209 lbs. We will do a CXR and start IV lasix 20 mg daily. Follow upon creatinine, and touch base with nephrology regarding this tomorrow.   Hypoalbuminemia - Nutrition consult today.   Goals of care discussion - Ongoing. Family remains hopeful and wants full support.   Neurology, ID and nephrology recs reviewed. Thank you for the inputs.   I have discussed the care of this patient with my IM team residents. Please see the resident note for details.  Mikalia Fessel 04/14/2014, 4:27 PM.

## 2014-04-15 DIAGNOSIS — E872 Acidosis, unspecified: Secondary | ICD-10-CM

## 2014-04-15 DIAGNOSIS — R0989 Other specified symptoms and signs involving the circulatory and respiratory systems: Secondary | ICD-10-CM | POA: Diagnosis not present

## 2014-04-15 DIAGNOSIS — M7989 Other specified soft tissue disorders: Secondary | ICD-10-CM

## 2014-04-15 DIAGNOSIS — K5900 Constipation, unspecified: Secondary | ICD-10-CM

## 2014-04-15 DIAGNOSIS — E8809 Other disorders of plasma-protein metabolism, not elsewhere classified: Secondary | ICD-10-CM

## 2014-04-15 LAB — GLUCOSE, CAPILLARY
GLUCOSE-CAPILLARY: 162 mg/dL — AB (ref 70–99)
GLUCOSE-CAPILLARY: 186 mg/dL — AB (ref 70–99)
GLUCOSE-CAPILLARY: 224 mg/dL — AB (ref 70–99)
Glucose-Capillary: 177 mg/dL — ABNORMAL HIGH (ref 70–99)
Glucose-Capillary: 194 mg/dL — ABNORMAL HIGH (ref 70–99)
Glucose-Capillary: 158 mg/dL — ABNORMAL HIGH (ref 70–99)
Glucose-Capillary: 166 mg/dL — ABNORMAL HIGH (ref 70–99)
Glucose-Capillary: 189 mg/dL — ABNORMAL HIGH (ref 70–99)
Glucose-Capillary: 200 mg/dL — ABNORMAL HIGH (ref 70–99)
Glucose-Capillary: 196 mg/dL — ABNORMAL HIGH (ref 70–99)
Glucose-Capillary: 229 mg/dL — ABNORMAL HIGH (ref 70–99)

## 2014-04-15 LAB — CBC
HEMATOCRIT: 25.7 % — AB (ref 36.0–46.0)
Hemoglobin: 8.7 g/dL — ABNORMAL LOW (ref 12.0–15.0)
MCH: 28.8 pg (ref 26.0–34.0)
MCHC: 33.9 g/dL (ref 30.0–36.0)
MCV: 85.1 fL (ref 78.0–100.0)
PLATELETS: 182 10*3/uL (ref 150–400)
RBC: 3.02 MIL/uL — ABNORMAL LOW (ref 3.87–5.11)
RDW: 16.3 % — AB (ref 11.5–15.5)
WBC: 9.3 10*3/uL (ref 4.0–10.5)

## 2014-04-15 LAB — BASIC METABOLIC PANEL
BUN: 144 mg/dL — ABNORMAL HIGH (ref 6–23)
CO2: 16 meq/L — AB (ref 19–32)
Calcium: 9.9 mg/dL (ref 8.4–10.5)
Chloride: 105 mEq/L (ref 96–112)
Creatinine, Ser: 5.95 mg/dL — ABNORMAL HIGH (ref 0.50–1.10)
GFR calc Af Amer: 7 mL/min — ABNORMAL LOW (ref >90)
GFR calc non Af Amer: 6 mL/min — ABNORMAL LOW (ref >90)
Glucose, Bld: 259 mg/dL — ABNORMAL HIGH (ref 70–99)
POTASSIUM: 5.1 meq/L (ref 3.7–5.3)
SODIUM: 142 meq/L (ref 137–147)

## 2014-04-15 LAB — RENAL FUNCTION PANEL
Albumin: 1.4 g/dL — ABNORMAL LOW (ref 3.5–5.2)
BUN: 139 mg/dL — ABNORMAL HIGH (ref 6–23)
CO2: 16 meq/L — AB (ref 19–32)
Calcium: 9.7 mg/dL (ref 8.4–10.5)
Chloride: 105 mEq/L (ref 96–112)
Creatinine, Ser: 5.89 mg/dL — ABNORMAL HIGH (ref 0.50–1.10)
GFR, EST AFRICAN AMERICAN: 7 mL/min — AB (ref >90)
GFR, EST NON AFRICAN AMERICAN: 6 mL/min — AB (ref >90)
Glucose, Bld: 235 mg/dL — ABNORMAL HIGH (ref 70–99)
PHOSPHORUS: 2.9 mg/dL (ref 2.3–4.6)
Potassium: 5.7 mEq/L — ABNORMAL HIGH (ref 3.7–5.3)
SODIUM: 142 meq/L (ref 137–147)

## 2014-04-15 LAB — PRO B NATRIURETIC PEPTIDE: PRO B NATRI PEPTIDE: 13216 pg/mL — AB (ref 0–450)

## 2014-04-15 LAB — PROCALCITONIN: Procalcitonin: 0.67 ng/mL

## 2014-04-15 LAB — MAGNESIUM: Magnesium: 2.6 mg/dL — ABNORMAL HIGH (ref 1.5–2.5)

## 2014-04-15 MED ORDER — SODIUM POLYSTYRENE SULFONATE 15 GM/60ML PO SUSP
30.0000 g | Freq: Once | ORAL | Status: AC
  Administered 2014-04-15: 30 g
  Filled 2014-04-15: qty 120

## 2014-04-15 MED ORDER — FUROSEMIDE 10 MG/ML IJ SOLN
80.0000 mg | Freq: Two times a day (BID) | INTRAMUSCULAR | Status: DC
  Administered 2014-04-15 – 2014-04-16 (×3): 80 mg via INTRAVENOUS
  Filled 2014-04-15 (×5): qty 8

## 2014-04-15 MED ORDER — FUROSEMIDE 10 MG/ML IJ SOLN: 20.0000 mg | Freq: Every day | INTRAMUSCULAR | Status: DC

## 2014-04-15 MED ORDER — FUROSEMIDE 10 MG/ML IJ SOLN
80.0000 mg | Freq: Once | INTRAMUSCULAR | Status: AC
  Administered 2014-04-15: 80 mg via INTRAVENOUS
  Filled 2014-04-15: qty 8

## 2014-04-15 NOTE — Progress Notes (Signed)
Subjective: No significant overnight adverse clinical events reported.  Objective: Current vital signs: BP 126/64  Pulse 72  Temp(Src) 98.4 F (36.9 C) (Oral)  Resp 14  Ht 5\' 2"  (1.575 m)  Wt 100.971 kg (222 lb 9.6 oz)  BMI 40.70 kg/m2  SpO2 95%  Neurologic Exam: Lethargic but easy to arouse than she was 24 hours ago. Patient has minimal verbalization response to verbal or tactile stimulation. Speech is unintelligible, however. Patient did not follow any verbal commands. Pupils were equal and reacted normally to light. Extraocular movements were full and conjugate with right and left lateral gaze. No facial weakness noted. Equal withdrawal of extremities to noxious stimuli. Plantar responses were extensor bilaterally.  Medications: I have reviewed the patient's current medications.  Assessment/Plan: Encephalopathic state likely multifactorial with metabolic as well as toxic etiologies. Depakote was discontinued yesterday. Patient is slightly more responsive. It's unclear if she is actually conscious.  I am recommending no changes in current management. We will continue to follow this patient with you.  C.R. Nicole Kindred, MD [facility] [phone]  04/15/2014  9:20 AM

## 2014-04-15 NOTE — Progress Notes (Signed)
INFECTIOUS DISEASE PROGRESS NOTE  ID: [name] [name] is a 80 y.o. female with  Principal Problem:   Seizures Active Problems:   Osteomyelitis of thoracic spine   Acute encephalopathy   Pancreatitis, acute   Acute on chronic renal failure   Acute on chronic kidney failure   Non-convulsive status epilepticus   Protein-calorie malnutrition, severe   Anemia of chronic disease   Pulmonary vascular congestion  Subjective: Does not awaken  Abtx:  Anti-infectives   Start     Dose/Rate Route Frequency Ordered Stop   04/15/14 1700  DAPTOmycin (CUBICIN) 500 mg in sodium chloride 0.9 % IVPB     500 mg 220 mL/hr over 30 Minutes Intravenous Every 48 hours 04/13/14 1639     04/15/14 1515  DAPTOmycin (CUBICIN) 500 mg in sodium chloride 0.9 % IVPB  Status:  Discontinued     500 mg 220 mL/hr over 30 Minutes Intravenous Every 48 hours 04/13/14 1516 04/13/14 1639   04/14/14 1600  meropenem (MERREM) 500 mg in sodium chloride 0.9 % 50 mL IVPB     500 mg 100 mL/hr over 30 Minutes Intravenous Every 24 hours 04/14/14 1545     04/13/14 1515  DAPTOmycin (CUBICIN) 500 mg in sodium chloride 0.9 % IVPB  Status:  Discontinued     500 mg 220 mL/hr over 30 Minutes Intravenous Every 24 hours 04/13/14 1514 04/13/14 1516   04/11/14 1000  fluconazole (DIFLUCAN) 40 MG/ML suspension 100 mg     100 mg Oral Daily 04/11/14 0917 04/18/14 0959   04/11/14 1000  ciprofloxacin (CIPRO) 500 MG/5ML (10%) suspension 500 mg  Status:  Discontinued     500 mg Oral Daily 04/11/14 0917 04/12/14 1624   04/04/14 1000  ceFEPIme (MAXIPIME) 1 g in dextrose 5 % 50 mL IVPB  Status:  Discontinued     1 g 100 mL/hr over 30 Minutes Intravenous Every 24 hours 03/28/2014 1821 04/04/14 0931   04/04/14 1000  DAPTOmycin (CUBICIN) 530.5 mg in sodium chloride 0.9 % IVPB  Status:  Discontinued     6 mg/kg  88.4 kg 221.2 mL/hr over 30 Minutes Intravenous Every 24 hours 04/10/2014 1821 04/04/14 0138   04/15/2014 1230  ciprofloxacin (CIPRO) IVPB 400  mg     400 mg 200 mL/hr over 60 Minutes Intravenous  Once 04/10/2014 1225 04/02/2014 1423   03/28/2014 1200  cefTRIAXone (ROCEPHIN) 1 g in dextrose 5 % 50 mL IVPB  Status:  Discontinued     1 g 100 mL/hr over 30 Minutes Intravenous  Once 03/30/2014 1147 04/14/2014 1147   03/25/2014 1200  ceFEPIme (MAXIPIME) 2 g in dextrose 5 % 50 mL IVPB  Status:  Discontinued     2 g 100 mL/hr over 30 Minutes Intravenous Every 12 hours 04/02/2014 1148 04/01/2014 1225      Medications:  Scheduled: . antiseptic oral rinse  15 mL Mouth Rinse q12n4p  . carvedilol  25 mg Per Tube BID WC  . chlorhexidine  15 mL Mouth Rinse BID  . cloNIDine  0.1 mg Transdermal Weekly  . DAPTOmycin (CUBICIN)  IV  500 mg Intravenous Q48H  . darbepoetin (ARANESP) injection - NON-DIALYSIS  100 mcg Subcutaneous Q Tue-1800  . fluconazole  100 mg Oral Daily  . furosemide  80 mg Intravenous Once  . heparin subcutaneous  5,000 Units Subcutaneous 3 times per day  . hydrALAZINE  25 mg Oral 3 times per day  . insulin aspart  0-15 Units Subcutaneous 6 times per day  .  meropenem (MERREM) IV  500 mg Intravenous Q24H  . polyethylene glycol  17 g Per Tube Daily  . sodium bicarbonate  1,300 mg Per Tube TID  . sodium polystyrene  30 g Per Tube Once    Objective: Vital signs in last 24 hours: Temp:  [97.8 F (36.6 C)-98.4 F (36.9 C)] 98.4 F (36.9 C) (05/28 0400) Pulse Rate:  [65-98] 72 (05/28 0400) Resp:  [13-17] 14 (05/28 0400) BP: (118-156)/(37-64) 126/64 mmHg (05/28 0549) SpO2:  [95 %-97 %] 95 % (05/28 0400) Weight:  [100.971 kg (222 lb 9.6 oz)] 100.971 kg (222 lb 9.6 oz) (05/28 0400)   General appearance: no distress Resp: clear to auscultation bilaterally Cardio: regular rate and rhythm GI: normal findings: bowel sounds normal and soft, non-tender  Lab Results  Recent Labs  04/14/14 0500 04/15/14 0610 04/15/14 0855  WBC 9.9 9.3  --   HGB 8.6* 8.7*  --   HCT 25.7* 25.7*  --   NA 140  --  142  K 5.3  --  5.7*  CL 104  --  105   CO2 17*  --  16*  BUN 125*  --  139*  CREATININE 5.84*  --  5.89*   Liver Panel  Recent Labs  04/14/14 0500 04/15/14 0855  PROT 6.3  --   ALBUMIN 1.5*  1.5* 1.4*  AST 39*  --   ALT 24  --   ALKPHOS 68  --   BILITOT <0.2*  --   BILIDIR <0.2  --   IBILI NOT CALCULATED  --    Sedimentation Rate No results found for this basename: ESRSEDRATE,  in the last 72 hours C-Reactive Protein No results found for this basename: CRP,  in the last 72 hours  Microbiology: Recent Results (from the past 240 hour(s))  URINE CULTURE     Status: None   Collection Time    04/10/14  5:17 PM      Result Value Ref Range Status   Specimen Description URINE, CATHETERIZED   Final   Special Requests NONE   Final   Culture  Setup Time     Final   Value: 04/11/2014 03:48     Performed at [facility]     Final   Value: >=100,000 COLONIES/ML     Performed at [facility]   Culture     Final   Value: YEAST     Performed at [facility]   Report Status 04/12/2014 FINAL   Final    Studies/Results: Ct Abdomen Pelvis Wo Contrast  04/13/2014   CLINICAL DATA:  Pancreatitis, renal insufficiency, 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  COMPARISON:  03/25/2014  FINDINGS: Limited exam without IV contrast and diffuse motion artifact.  Lower chest: Nodular bibasilar opacities noted, slightly worse suspicious for bibasilar pneumonia versus aspiration. Heart is enlarged. No pericardial effusion. No significant pleural effusion.  Abdomen: Feeding tube terminates in the distal stomach. Prior cholecystectomy noted. No gross biliary dilatation. There is interval improvement in the previously described strandy edema and fluid about the proximal pancreatic head. Findings compatible with resolved pancreatitis. No  peripancreatic fluid collection to suggest pseudocyst or abscess. No ductal dilatation appreciated. Kidneys demonstrate low-density cystic lesions bilaterally compatible with renal cysts. No renal obstruction or hydronephrosis.  Aortic atherosclerosis noted without aneurysm.  Negative  for bowel obstruction, dilatation, or ileus. Normal appendix demonstrated.  No abdominal fluid collection, abscess, hemorrhage, or adenopathy.  Pelvis: Large amount of stool in the rectum. No pelvic fluid collection, hemorrhage, definite abscess, inguinal abnormality, or hernia. Bladder is collapsed by Foley catheter.  T10-11 osseous destructive process involving the disc space noted compatible with known spondylo discitis. Degenerative changes elsewhere in the thoracic and lumbar spine.  IMPRESSION: Limited exam with motion artifact.  Resolved edema and fluid about the proximal pancreas, compatible with improving pancreatitis.  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   On: 04/13/2014 18:27   Dg Chest 2 View  04/15/2014   CLINICAL DATA:  Shortness of breath, cough, question pneumonia  EXAM: CHEST  2 VIEW  COMPARISON:  04/04/2014  FINDINGS: Rotated to the RIGHT.  RIGHT arm PICC line tip projects over SVC.  Enlargement of cardiac silhouette with mild pulmonary vascular congestion.  Atherosclerotic calcification of a tortuous thoracic aorta.  Persistent pulmonary infiltrates bilaterally suggesting pulmonary edema.  Atelectasis at medial LEFT base.  No pleural effusion or pneumothorax.  Multilevel endplate spur formation thoracic spine.  IMPRESSION: Enlargement of cardiac silhouette with pulmonary vascular congestion.  Persistent pulmonary infiltrates question pulmonary edema.   Electronically Signed   By: Lavonia Dana M.D.   On: 04/15/2014 03:11   Dg Ankle Complete Left  04/13/2014   CLINICAL  DATA:  Left ankle and foot pain  EXAM: LEFT ANKLE COMPLETE - 3+ VIEW  COMPARISON:  04/13/2014  FINDINGS: Bones are osteopenic. Normal left ankle alignment without fracture. Distal tibia, fibula, talus and calcaneus appear intact. Vascular calcifications noted. Degenerative changes of the talonavicular articulation.  IMPRESSION: No acute osseous finding.   Electronically Signed   By: Daryll Brod M.D.   On: 04/13/2014 18:57   Mr Brain Wo Contrast  04/14/2014   CLINICAL DATA:  80 year old female with altered mental status. Abnormal EEG. Initial encounter.  EXAM: MRI HEAD WITHOUT CONTRAST  TECHNIQUE: Multiplanar, multiecho pulse sequences of the brain and surrounding structures were obtained without intravenous contrast.  COMPARISON:  Brain MRI 04/04/2014.  Head CT Apr 19, 2014.  FINDINGS: Study is intermittently degraded by motion artifact despite repeated imaging attempts.  No restricted diffusion to suggest acute infarction. No midline shift, mass effect, evidence of mass lesion, ventriculomegaly, extra-axial collection or acute intracranial hemorrhage. Cervicomedullary junction and pituitary are within normal limits. Major intracranial vascular flow voids are stable. Stable gray and white matter signal in the brain. Tentorial calcifications thought responsible for the T2* foci on series 10, image 10. Essentially normal for age gray and white matter signal. Stable visualized upper cervical spine. Stable orbits, paranasal sinuses, mastoids, and scalp soft tissues.  IMPRESSION: Stable and essentially normal for age non contrast MRI appearance of the brain. Today's study is intermittently motion degraded.   Electronically Signed   By: Lars Pinks M.D.   On: 04/14/2014 10:49   Dg Foot Complete Left  04/13/2014   CLINICAL DATA:  Left ankle and foot pain  EXAM: LEFT FOOT - COMPLETE 3+ VIEW  COMPARISON:  04/13/2014, 08/11/2004  FINDINGS: Diffuse left foot soft tissue swelling on the lateral view. Bones are osteopenic.  Degenerative changes of the left talonavicular articulation with joint space loss, sclerosis and subchondral cyst formation. Normal alignment without acute fracture.  IMPRESSION: Soft tissue swelling.  Midfoot osteoarthritis  No acute osseous finding or fracture  Electronically Signed   By: Daryll Brod M.D.   On: 04/13/2014 19:00     Assessment/Plan: Discitis/Osteo  Aspirate January (-) on anbx, vanco/ceftriaxone  Repeat MRI 4-24- worsening of osteo/discitis T10-11. Started on Dapto/cefepime 5-4, stopped 5-16  Encephalopathy, seizures (due to cefepime)  ARF  Pancreatitis (lipase 1219 --> 89), resolved    Total days of antibiotics: 2 dapto/merrem  Will continue to watch her on her new anbx regimen.  Will ask Dr Tommy Medal for comment on this as he has followed her in outpt ID clinic.  Will defer to primary regarding EOL issues.          [facility] (pager) [phone] [email or web address] 04/15/2014, 11:28 AM  LOS: 12 days   **Disclaimer: This note may have been dictated with voice recognition software. Similar sounding words can inadvertently be transcribed and this note may contain transcription errors which may not have been corrected upon publication of note.**

## 2014-04-15 NOTE — Progress Notes (Signed)
  I have seen and examined the patient, and reviewed the daily progress note by Jori Moll L. Willey, [place] [facility] and discussed the care of the patient with them. Please see my progress note from 04/15/2014 for further details regarding assessment and plan.    Signed:  Juluis Mire, MD 04/15/2014, 9:45 PM

## 2014-04-15 NOTE — Progress Notes (Signed)
Day 12 of stay      Patient name: [name] [name]  Medical record number: [id number]  Date of birth: May 16, 1930  Patient was minimally responsive to verbal cues of calling her name today. She tried to open her eyes.   Filed Vitals:   04/15/14 0015 04/15/14 0200 04/15/14 0400 04/15/14 0549  BP: 128/58 120/64 118/50 126/64  Pulse:   72   Temp:   98.4 F (36.9 C)   TempSrc:   Oral   Resp:   14   Height:      Weight:   222 lb 9.6 oz (100.971 kg)   SpO2: 95% 95% 95%     Physical Exam   General: Lying in bed. Minimally responsive. HEENT: PERRL, no scleral icterus, ocular movements could not be tested. Heart: RRR, systolic murmur.  Lungs: rales all fields. Abdomen: Soft, nontender, nondistended, BS present. Extremities: Warm, left ankle has edema, tender to touch.  Neuro: Alert and oriented X3, plantars upgoing, withdraws to painful stimuli.   Labs   Recent Labs Lab 04/09/14 0420  04/11/14 0500 04/12/14 0430 04/13/14 0430 04/14/14 0500 04/15/14 0610 04/15/14 0855  NA 143  < > 143 141 142 140  --  142  K 3.8  < > 4.1 4.3 4.9 5.3  --  5.7*  CL 108  < > 110 108 107 104  --  105  CO2 22  < > 19 19 18* 17*  --  16*  GLUCOSE 140*  < > 169* 139* 151* 199*  --  235*  BUN 55*  < > 74* 88* 106* 125*  --  139*  CREATININE 4.65*  < > 4.78* 5.15* 5.41* 5.84*  --  5.89*  CALCIUM 10.7*  < > 10.4 10.9* 11.0* 10.3  --  9.7  MG 2.3  --   --   --   --   --  2.6*  --   PHOS 3.3  < > 1.7* 2.8 2.8 2.7  --  2.9  < > = values in this interval not displayed.   Recent Labs Lab 04/13/14 0430 04/14/14 0500 04/15/14 0610  HGB 8.9* 8.6* 8.7*  HCT 26.2* 25.7* 25.7*  WBC 11.1* 9.9 9.3  PLT 205 208 182    Medications   . antiseptic oral rinse  15 mL Mouth Rinse q12n4p  . carvedilol  25 mg Per Tube BID WC  . chlorhexidine  15 mL Mouth Rinse BID  . cloNIDine  0.1 mg Transdermal Weekly  . DAPTOmycin (CUBICIN)  IV  500 mg Intravenous Q48H  . darbepoetin (ARANESP) injection - NON-DIALYSIS   100 mcg Subcutaneous Q Tue-1800  . fluconazole  100 mg Oral Daily  . furosemide  80 mg Intravenous Once  . heparin subcutaneous  5,000 Units Subcutaneous 3 times per day  . hydrALAZINE  25 mg Oral 3 times per day  . insulin aspart  0-15 Units Subcutaneous 6 times per day  . meropenem (MERREM) IV  500 mg Intravenous Q24H  . polyethylene glycol  17 g Per Tube Daily  . sodium bicarbonate  1,300 mg Per Tube TID  . sodium polystyrene  30 g Per Tube Once    Assessment and Plan    Encephalopathy - The patient showed some response for the first time to verbal cues. Does not appear to be conscious or arousable. Neurology recommends no changes in management. Depakote has been stopped for now. Electrolytes are being managed as needed on a day to day  basis - mild hyperkalemia today.   CHF - We tried 20 mg IV lasix yesterday with mild increase in creatinine seen today. Per nephrology suggestion, we will try increased dose of 160 mg lasix IV given as 80 twice a day, and monitor kidney function.   CKD - following up creatinine with nephrology. Bicarb started for acidosis today.  Nutrition - TF started today, risk of aspiration will be explained to the family.   Code - remains full.  I have discussed the care of this patient with my IM team residents. Please see the resident note for details.  [name] 04/15/2014, 11:18 AM.

## 2014-04-15 NOTE — Progress Notes (Signed)
Pt transferred to 2W. Provided handoff. This CSW signing off; unit CSW to follow up.   Ky Barban, MSW, [facility] Clinical Social Worker [phone]

## 2014-04-15 NOTE — Progress Notes (Signed)
Subjective:  Patient seen and examined in AM. No seizure activity overnight. When we walked into the room and called her name, she showed slight movement of her eyelids in response to her name. She continues to grimace in pain in response to stimuli on her left foot. Patient's family members were not in the room during the time of the exam.     Objective: Vital signs in last 24 hours: Filed Vitals:   04/15/14 0015 04/15/14 0200 04/15/14 0400 04/15/14 0549  BP: 128/58 120/64 118/50 126/64  Pulse:   72   Temp:   98.4 F (36.9 C)   TempSrc:   Oral   Resp:   14   Height:      Weight:   100.971 kg (222 lb 9.6 oz)   SpO2: 95% 95% 95%    Weight change: 5.971 kg (13 lb 2.6 oz)  Intake/Output Summary (Last 24 hours) at 04/15/14 0658 Last data filed at 04/15/14 7412  Gross per 24 hour  Intake   2370 ml  Output    800 ml  Net   1570 ml   Physical Examination: General: Somnolent   Eyes: Asleep Neck: Normal ROM. Neck supple.  Heart: Normal rate and regular rhythm.  Lungs: Breathing comfortably on RA. Ronchi in anterior fields. No wheezing, ronchi, or rales.  Abdominal: Soft, non-distended, non-tender, minimal BS. No rebound or guarding.  Extremities: SCD's. Left ankle swelling and tenderness to palpation. Neurological: Somnolent. Not arousable to voice or touch. Withdraws to painful stimuli. Unable to follow commands or open eyes. Positive babinski b/l   Lab Results: Basic Metabolic Panel:  Recent Labs Lab 04/09/14 0420  04/13/14 0430 04/14/14 0500  NA 143  < > 142 140  K 3.8  < > 4.9 5.3  CL 108  < > 107 104  CO2 22  < > 18* 17*  GLUCOSE 140*  < > 151* 199*  BUN 55*  < > 106* 125*  CREATININE 4.65*  < > 5.41* 5.84*  CALCIUM 10.7*  < > 11.0* 10.3  MG 2.3  --   --   --   PHOS 3.3  < > 2.8 2.7  < > = values in this interval not displayed. Liver Function Tests:  Recent Labs Lab 04/11/14 0500  04/13/14 0430 04/14/14 0500  AST  --   --   --  39*  ALT  --   --   --  24   ALKPHOS  --   --   --  68  BILITOT <0.2*  --   --  <0.2*  PROT  --   --   --  6.3  ALBUMIN 1.6*  < > 1.7* 1.5*  1.5*  < > = values in this interval not displayed.  Recent Labs Lab 04/12/14 0430  LIPASE 89*   CBC:  Recent Labs Lab 04/11/14 0500  04/13/14 0430 04/14/14 0500  WBC 10.8*  < > 11.1* 9.9  NEUTROABS 8.1*  --   --  7.1  HGB 6.8*  < > 8.9* 8.6*  HCT 20.7*  < > 26.2* 25.7*  MCV 85.9  < > 85.1 84.8  PLT 190  < > 205 208  < > = values in this interval not displayed. Cardiac Enzymes:  Recent Labs Lab 04/10/14 0500  CKTOTAL 26   CBG:  Recent Labs Lab 04/14/14 0040 04/14/14 0411 04/14/14 0734 04/14/14 1229 04/14/14 1613 04/14/14 2020  GLUCAP 198* 177* 194* 162* 158* 166*   Urinalysis:  Recent Labs Lab 04/10/14 0947  COLORURINE YELLOW  LABSPEC 1.018  PHURINE 5.5  GLUCOSEU NEGATIVE  HGBUR MODERATE*  BILIRUBINUR NEGATIVE  KETONESUR NEGATIVE  PROTEINUR 100*  UROBILINOGEN 0.2  NITRITE NEGATIVE  LEUKOCYTESUR LARGE*     Studies/Results: Ct Abdomen Pelvis Wo Contrast  04/13/2014   CLINICAL DATA:  Pancreatitis, renal insufficiency, 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  COMPARISON:  04/05/2014  FINDINGS: Limited exam without IV contrast and diffuse motion artifact.  Lower chest: Nodular bibasilar opacities noted, slightly worse suspicious for bibasilar pneumonia versus aspiration. Heart is enlarged. No pericardial effusion. No significant pleural effusion.  Abdomen: Feeding tube terminates in the distal stomach. Prior cholecystectomy noted. No gross biliary dilatation. There is interval improvement in the previously described strandy edema and fluid about the proximal pancreatic head. Findings compatible with resolved pancreatitis. No peripancreatic fluid collection to suggest pseudocyst or  abscess. No ductal dilatation appreciated. Kidneys demonstrate low-density cystic lesions bilaterally compatible with renal cysts. No renal obstruction or hydronephrosis.  Aortic atherosclerosis noted without aneurysm.  Negative for bowel obstruction, dilatation, or ileus. Normal appendix demonstrated.  No abdominal fluid collection, abscess, hemorrhage, or adenopathy.  Pelvis: Large amount of stool in the rectum. No pelvic fluid collection, hemorrhage, definite abscess, inguinal abnormality, or hernia. Bladder is collapsed by Foley catheter.  T10-11 osseous destructive process involving the disc space noted compatible with known spondylo discitis. Degenerative changes elsewhere in the thoracic and lumbar spine.  IMPRESSION: Limited exam with motion artifact.  Resolved edema and fluid about the proximal pancreas, compatible with improving pancreatitis.  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   On: 04/13/2014 18:27   Dg Chest 2 View  04/15/2014   CLINICAL DATA:  Shortness of breath, cough, question pneumonia  EXAM: CHEST  2 VIEW  COMPARISON:  04/04/2014  FINDINGS: Rotated to the RIGHT.  RIGHT arm PICC line tip projects over SVC.  Enlargement of cardiac silhouette with mild pulmonary vascular congestion.  Atherosclerotic calcification of a tortuous thoracic aorta.  Persistent pulmonary infiltrates bilaterally suggesting pulmonary edema.  Atelectasis at medial LEFT base.  No pleural effusion or pneumothorax.  Multilevel endplate spur formation thoracic spine.  IMPRESSION: Enlargement of cardiac silhouette with pulmonary vascular congestion.  Persistent pulmonary infiltrates question pulmonary edema.   Electronically Signed   By: Lavonia Dana M.D.   On: 04/15/2014 03:11   Dg Ankle Complete Left  04/13/2014   CLINICAL DATA:  Left ankle and foot pain  EXAM: LEFT ANKLE COMPLETE -  3+ VIEW  COMPARISON:  04/13/2014  FINDINGS: Bones are osteopenic. Normal left ankle alignment without fracture. Distal tibia, fibula, talus and calcaneus appear intact. Vascular calcifications noted. Degenerative changes of the talonavicular articulation.  IMPRESSION: No acute osseous finding.   Electronically Signed   By: Daryll Brod M.D.   On: 04/13/2014 18:57   Mr Brain Wo Contrast  04/14/2014   CLINICAL DATA:  80 year old female with altered mental status. Abnormal EEG. Initial encounter.  EXAM: MRI HEAD WITHOUT CONTRAST  TECHNIQUE: Multiplanar, multiecho pulse sequences of the brain and surrounding structures were obtained without intravenous contrast.  COMPARISON:  Brain MRI 04/04/2014.  Head CT 04/16/2014.  FINDINGS: Study is intermittently degraded by motion artifact despite repeated imaging attempts.  No restricted  diffusion to suggest acute infarction. No midline shift, mass effect, evidence of mass lesion, ventriculomegaly, extra-axial collection or acute intracranial hemorrhage. Cervicomedullary junction and pituitary are within normal limits. Major intracranial vascular flow voids are stable. Stable gray and white matter signal in the brain. Tentorial calcifications thought responsible for the T2* foci on series 10, image 10. Essentially normal for age gray and white matter signal. Stable visualized upper cervical spine. Stable orbits, paranasal sinuses, mastoids, and scalp soft tissues.  IMPRESSION: Stable and essentially normal for age non contrast MRI appearance of the brain. Today's study is intermittently motion degraded.   Electronically Signed   By: Lars Pinks M.D.   On: 04/14/2014 10:49   Dg Foot Complete Left  04/13/2014   CLINICAL DATA:  Left ankle and foot pain  EXAM: LEFT FOOT - COMPLETE 3+ VIEW  COMPARISON:  04/13/2014, 08/11/2004  FINDINGS: Diffuse left foot soft tissue swelling on the lateral view. Bones are osteopenic. Degenerative changes of the left talonavicular articulation  with joint space loss, sclerosis and subchondral cyst formation. Normal alignment without acute fracture.  IMPRESSION: Soft tissue swelling.  Midfoot osteoarthritis  No acute osseous finding or fracture   Electronically Signed   By: Daryll Brod M.D.   On: 04/13/2014 19:00   Medications: I have reviewed the patient's current medications. Scheduled Meds: . antiseptic oral rinse  15 mL Mouth Rinse q12n4p  . carvedilol  25 mg Per Tube BID WC  . chlorhexidine  15 mL Mouth Rinse BID  . cloNIDine  0.1 mg Transdermal Weekly  . DAPTOmycin (CUBICIN)  IV  500 mg Intravenous Q48H  . darbepoetin (ARANESP) injection - NON-DIALYSIS  100 mcg Subcutaneous Q Tue-1800  . fluconazole  100 mg Oral Daily  . heparin subcutaneous  5,000 Units Subcutaneous 3 times per day  . hydrALAZINE  25 mg Oral 3 times per day  . insulin aspart  0-15 Units Subcutaneous 6 times per day  . meropenem (MERREM) IV  500 mg Intravenous Q24H  . polyethylene glycol  17 g Per Tube Daily  . sodium bicarbonate  1,300 mg Per Tube TID   Continuous Infusions: . feeding supplement (VITAL AF 1.2 CAL) 1,000 mL (04/14/14 1738)   PRN Meds:.acetaminophen (TYLENOL) oral liquid 160 mg/5 mL, levalbuterol, sodium chloride Assessment/Plan:  Acute Encephalopathy in setting of Non-convulsive Status Epilepticus & Metabolic Toxicity -  No recent seizure activity. Last EEG on 5/25.  Etiology thought to be due to cefepime neuro toxicity.  -Appreciate neurology recommendations - MRI brain on 5/27 ---> normal -Frequent neuro checks -D/C valproic acid 500 mg TID per tube per neurology  -Avoid sedative medications  -Hold home cymbalta 60 mg daily and norco 10-325 mg Q 6 hr PRN  -Consider LP if continues be unresponsive  -Obtain PCT level -IV daptomycin and meropenem per ID for osteomyelitis  -Palliative consult if family agrees   Nonoliguric AKI on CKD Stage 4 - Worsening Cr 5.89, above 4.29 on admission and above baseline 1.9. Etiology likely due to  intrinsic renal (FeNa 4%) due to cefepime toxicity and very mild rhabdomyolysis from status epiliepticus (CK initially elevated).   -Appreciate nephrology recommendations -Avoid nephrotoxins  -Hold home torsemide 20 mg daily  -Give IV lasix 80 mg x 1 -Monitor BMP  -Keep foley catheter  -No HD at this time per nephrology   Metabolic Acidosis - Bicarbonate 16 below baseline. Etiology likely due to acute renal failure.  -Started sodium bicarbonate 1300 mg TID - 05/27  Possible Bibasilar Pneumonia -  Pt afebrile with no cough or leukocytosis. CT ab on 5/26 with worsening nodular bibasilar opacities -Obtain 2-view CXR -Repeat pro-BNP levels -IV lasix 80 mg x 1  due to xray suggestive of pulmonary edema -Currently on IV daptomycin and meropenem per ID for osteomyelitis   Destructive Osteomyelitis of T10-T11 - ESR & CRP elevated from admission.  Diagnosed Jan 2015. Pt has received 2 weeks of IV ceftriaxone and vancomycin; doxycyline and amoxicillin; and now IV cefepime and daptomycin since 5/4 -5/16. CT revealing destructive process at T10-T11 secondary to osteomyelitis and discitis. PICC line placed about 3 weeks ago.  -Appreciate ID recommendations -Continue IV daptomycin (started 5/26) and started IV meropenem on 05/27   -Obtain CK level -Follow leukocytosis and fever curve -Monitor PICC line for signs of infection -Outpatient ID F/U scheudled 04/26/14  History of gout with left ankle pain and swelling  - Pt with left foot swelling and tenderness on exam. Pt was not on gout medications at home.  -Obtain xray left ankle ---> soft tissue swelling, midfoot OA, no acute fracture -Obtain b/l doppler LE [place] ---> no DVT -SQ heparin TID and SCD's for DVT Ppx -Obtain uric acid level --> 8.9 -Consider colchicine 1.2 mg x 1 then 0.6 mg 1 hr later (no adjustment for CrCl <30),  CrCl currently 8.1   Acute on Chronic Normocytic Anemia - Hg 8.6 near baseline Hg 9 with no hemodynamic instability or active  bleeding. Pt received 2 pRBC's on 5/24 due to Hg 6.8.  Anemia panel 5/17 with reticulocytes (wnl), ferritin(253), iron (33 L), TIB (135 L), Iron sat (24%), folate (wnl), B12 (wnl). Etiology possibly due to autoimmune hemolytic anemia in setting of recent antibiotics (Coombs test +).  -Monitor for bleeding -Monitor CBC  -F/U hemolytic work-up---> technician smear review (burr cells, mild left shift)  -Obtain fibrinogen levels --> >800 (H), not suggestive of DIC -Transfuse if Hg <7    -Aranesp weekly (Tuesdays) per renal  -Consider corticosteroids if worsens  Hypoalbuminemia - Pt with low albumin levels in setting acute renal failure and infection.  -Obtain nutrition consult  Complicated Cystitis - UA with large leukocytes, pyuria (too numerous), few bacteriuria, and many yeast. Urine cultures with  > 100 K yeast -No antibiotics indicated -Continue flucanazole 100 mg Day 5/7  -Keep foley catheter   Acute Non-Biliary Pancreatitis - Improved/resolved.  Etiology unclear, possibly due to uncontrolled hypercalcemia.   -Appreciate GI recommendations -Diet NPO, feeding supplements per tube -Obtain CT abd/pelvis with oral contrast --> resolved edema and fluid about the proximal pancreas  Chronic Hypercalcemia in setting of secondary hyperparathyroidism with vitamin D insufficiency - Corrected calcium high 12.3.  Last PTH with 163.8 (H), vitamin D25-OH (23 L) on 03/19/2014.  Also questionable sarcoidosis per pulmonology. Pt received  IV pamidronate x 1 on 5/25  -F/U PTH levels ---> 241.9  Chronic Grade 1 Diastolic CHF -  Last 2D -echo on 12/17/13 with EF 60-65% and grade 1 diastolic dysfunction.   -Hold home torsemide 20 mg daily  -Hold home imdur 15 mg daily  -IV lasix 80 mg x 1 due to xray suggestive of pulmonary edema -Obtain repeat pro-BNP and 2-view CXR -Monitor daily weights and strict I & O's   Hypertension - Currently normotensive.    -Continue clonidine 0.1 mg patch weekly (at home on  clonidine 0.1 mg BID) -Continue hydralazine 25 mg TID (at home on 50 mg QID) -Continue home carvedilol 25 mg BID  -Hold home torsemide 20 mg daily  -Hold  home imdur 15 mg daily   Constipation - CT abd on 5/26 with large amount of stool in the rectum. -Miralax daily   Non-insulin dependent Type II DM - Last A1c of 6 on 5/16. Pt does not appear to be on medications at home.  -Sensitive SSI  -CBG monitoring Q 4 hrs while NPO   Hypokalemia - Resolved.  Magnesium levels normal. Etiology due to decreased PO intake. -Replete with caution in setting of CKD -Monitor BMP  Diet: NPO, tube feeding - nutrition consult warranted if family wants to continue treatment DVT Ppx: SQ Heparin TID, SCD's  Code: Full  Dispo: SNF once medically stable   The patient does have a current PCP (No Pcp Per Patient) and does need an [facility] hospital follow-up appointment after discharge.  The patient does have transportation limitations that hinder transportation to clinic appointments.  .Services Needed at time of discharge: Y = Yes, Blank = No PT:   OT:   RN:   Equipment:   Other:     LOS: 12 days   [name] [name], Med Student 04/15/2014, 6:58 AM

## 2014-04-15 NOTE — Consult Note (Signed)
Pharmacy student rounding with [facility]. Consulted by IM team with question regarding the presentation and time to resolution of cefepime neurotoxicity. Neurological deterioration has been shown to present within the range of 1-10 days following the initiation of cefepime treatment. Initial symptoms include confusion, agitation, myoclonus, decreased consciousness, convulsions, and coma.   Cefepime is renally eliminated (85% unchanged), with a half-life approximately 5 times longer in patients with severe renal impairment (<10 ml/min).  Patients are more likely to develop cefepime neurotoxicity if they are not dosed based on renal clearance.  Cefepime levels ?22 mg/L have a 50% probability of inducing neurotoxicity, presenting the usefulness of monitoring blood concentrations to avoid over dosage and accumulation.  Results regarding improvement are inconclusive at this time.  Following discontinuation of therapy, case reports have shown neurological improvement within 10 days, and return to baseline mental status by day 17. Conversely, a retrospective study of patients with renal impairment followed eight patients who all died, despite discontinuation of cefepime on average 2.4 days after neurological symptoms began. Recovery has been shown to be dependent on an early diagnosis and withdrawal of cefepime, without pharmacological intervention.  Hemodialysis can decrease the half-life of cefepime to approximately 2 hours, but recovery remains dependent on early diagnosis and discontinuation of therapy. At this time, there is no distinct correlation between the time to discontinuation and rate of neurological improvement.   Percell Locus, PharmD Candidate, [place] Have reviewed note and agree with assessment, literature review and content of note. Jorene Guest, PharmD, Professor of Pharmacy, [facility] [employer], Patent attorney Professor of Medicine, [facility]

## 2014-04-15 NOTE — Progress Notes (Signed)
Subjective:  Pt seen and examined in AM. No seizure activity overnight.  Pt more arousable and trying to open eyes. Was able to say a few words today. Still unable to follow commands but continues to be responsive to painful stimuli. Left ankle still swollen and painful.    Objective: Vital signs in last 24 hours: Filed Vitals:   04/15/14 0015 04/15/14 0200 04/15/14 0400 04/15/14 0549  BP: 128/58 120/64 118/50 126/64  Pulse:   72   Temp:   98.4 F (36.9 C)   TempSrc:   Oral   Resp:   14   Height:      Weight:   100.971 kg (222 lb 9.6 oz)   SpO2: 95% 95% 95%    Weight change: 5.971 kg (13 lb 2.6 oz)  Intake/Output Summary (Last 24 hours) at 04/15/14 1056 Last data filed at 04/15/14 2831  Gross per 24 hour  Intake   2340 ml  Output    800 ml  Net   1540 ml   Physical Examination: General: Somnolent but minimally arousable   Eyes: PERRL Neck: Normal ROM. Neck supple.  Heart: Normal rate and regular rhythm.  Lungs: Breathing comfortably on RA. Ronchi in anterior fields. No wheezing, ronchi, or rales.  Abdominal: Soft, non-distended, non-tender, minimal BS. No rebound or guarding.  Extremities: SCD's. Left ankle swelling and tenderness to palpation. Neurological: Somnolent. Not arousable to voice or touch. Withdraws to painful stimuli. Unable to follow commands or open eyes. Positive babinski b/l   Lab Results: Basic Metabolic Panel:  Recent Labs Lab 04/09/14 0420  04/14/14 0500 04/15/14 0610 04/15/14 0855  NA 143  < > 140  --  142  K 3.8  < > 5.3  --  5.7*  CL 108  < > 104  --  105  CO2 22  < > 17*  --  16*  GLUCOSE 140*  < > 199*  --  235*  BUN 55*  < > 125*  --  139*  CREATININE 4.65*  < > 5.84*  --  5.89*  CALCIUM 10.7*  < > 10.3  --  9.7  MG 2.3  --   --  2.6*  --   PHOS 3.3  < > 2.7  --  2.9  < > = values in this interval not displayed. Liver Function Tests:  Recent Labs Lab 04/11/14 0500  04/14/14 0500 04/15/14 0855  AST  --   --  39*   --   ALT  --   --  24  --   ALKPHOS  --   --  68  --   BILITOT <0.2*  --  <0.2*  --   PROT  --   --  6.3  --   ALBUMIN 1.6*  < > 1.5*  1.5* 1.4*  < > = values in this interval not displayed.  Recent Labs Lab 04/12/14 0430  LIPASE 89*   CBC:  Recent Labs Lab 04/11/14 0500  04/14/14 0500 04/15/14 0610  WBC 10.8*  < > 9.9 9.3  NEUTROABS 8.1*  --  7.1  --   HGB 6.8*  < > 8.6* 8.7*  HCT 20.7*  < > 25.7* 25.7*  MCV 85.9  < > 84.8 85.1  PLT 190  < > 208 182  < > = values in this interval not displayed. Cardiac Enzymes:  Recent Labs Lab 04/10/14 0500  CKTOTAL 26   CBG:  Recent Labs Lab 04/14/14  1229 04/14/14 1613 04/14/14 2020 04/14/14 2330 04/15/14 0410 04/15/14 0803  GLUCAP 162* 158* 166* 186* 189* 200*   Urinalysis:  Recent Labs Lab 04/10/14 0947  COLORURINE YELLOW  LABSPEC 1.018  PHURINE 5.5  GLUCOSEU NEGATIVE  HGBUR MODERATE*  BILIRUBINUR NEGATIVE  KETONESUR NEGATIVE  PROTEINUR 100*  UROBILINOGEN 0.2  NITRITE NEGATIVE  LEUKOCYTESUR LARGE*     Studies/Results: Ct Abdomen Pelvis Wo Contrast  04/13/2014   CLINICAL DATA:  Pancreatitis, renal insufficiency, 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  COMPARISON:  03/24/2014  FINDINGS: Limited exam without IV contrast and diffuse motion artifact.  Lower chest: Nodular bibasilar opacities noted, slightly worse suspicious for bibasilar pneumonia versus aspiration. Heart is enlarged. No pericardial effusion. No significant pleural effusion.  Abdomen: Feeding tube terminates in the distal stomach. Prior cholecystectomy noted. No gross biliary dilatation. There is interval improvement in the previously described strandy edema and fluid about the proximal pancreatic head. Findings compatible with resolved pancreatitis. No peripancreatic fluid collection to  suggest pseudocyst or abscess. No ductal dilatation appreciated. Kidneys demonstrate low-density cystic lesions bilaterally compatible with renal cysts. No renal obstruction or hydronephrosis.  Aortic atherosclerosis noted without aneurysm.  Negative for bowel obstruction, dilatation, or ileus. Normal appendix demonstrated.  No abdominal fluid collection, abscess, hemorrhage, or adenopathy.  Pelvis: Large amount of stool in the rectum. No pelvic fluid collection, hemorrhage, definite abscess, inguinal abnormality, or hernia. Bladder is collapsed by Foley catheter.  T10-11 osseous destructive process involving the disc space noted compatible with known spondylo discitis. Degenerative changes elsewhere in the thoracic and lumbar spine.  IMPRESSION: Limited exam with motion artifact.  Resolved edema and fluid about the proximal pancreas, compatible with improving pancreatitis.  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   On: 04/13/2014 18:27   Dg Chest 2 View  04/15/2014   CLINICAL DATA:  Shortness of breath, cough, question pneumonia  EXAM: CHEST  2 VIEW  COMPARISON:  04/04/2014  FINDINGS: Rotated to the RIGHT.  RIGHT arm PICC line tip projects over SVC.  Enlargement of cardiac silhouette with mild pulmonary vascular congestion.  Atherosclerotic calcification of a tortuous thoracic aorta.  Persistent pulmonary infiltrates bilaterally suggesting pulmonary edema.  Atelectasis at medial LEFT base.  No pleural effusion or pneumothorax.  Multilevel endplate spur formation thoracic spine.  IMPRESSION: Enlargement of cardiac silhouette with pulmonary vascular congestion.  Persistent pulmonary infiltrates question pulmonary edema.   Electronically Signed   By: Lavonia Dana M.D.   On: 04/15/2014 03:11   Dg Ankle Complete Left  04/13/2014   CLINICAL DATA:  Left ankle and foot pain  EXAM:  LEFT ANKLE COMPLETE - 3+ VIEW  COMPARISON:  04/13/2014  FINDINGS: Bones are osteopenic. Normal left ankle alignment without fracture. Distal tibia, fibula, talus and calcaneus appear intact. Vascular calcifications noted. Degenerative changes of the talonavicular articulation.  IMPRESSION: No acute osseous finding.   Electronically Signed   By: Daryll Brod M.D.   On: 04/13/2014 18:57   Mr Brain Wo Contrast  04/14/2014   CLINICAL DATA:  80 year old female with altered mental status. Abnormal EEG. Initial encounter.  EXAM: MRI HEAD WITHOUT CONTRAST  TECHNIQUE: Multiplanar, multiecho pulse sequences of the brain and surrounding structures were obtained without intravenous contrast.  COMPARISON:  Brain MRI 04/04/2014.  Head CT 04/04/2014.  FINDINGS: Study is intermittently degraded by motion artifact despite repeated imaging attempts.  No restricted diffusion to suggest acute infarction. No midline shift, mass effect, evidence of mass lesion, ventriculomegaly, extra-axial collection or acute intracranial hemorrhage. Cervicomedullary junction and pituitary are within normal limits. Major intracranial vascular flow voids are stable. Stable gray and white matter signal in the brain. Tentorial calcifications thought responsible for the T2* foci on series 10, image 10. Essentially normal for age gray and white matter signal. Stable visualized upper cervical spine. Stable orbits, paranasal sinuses, mastoids, and scalp soft tissues.  IMPRESSION: Stable and essentially normal for age non contrast MRI appearance of the brain. Today's study is intermittently motion degraded.   Electronically Signed   By: Lars Pinks M.D.   On: 04/14/2014 10:49   Dg Foot Complete Left  04/13/2014   CLINICAL DATA:  Left ankle and foot pain  EXAM: LEFT FOOT - COMPLETE 3+ VIEW  COMPARISON:  04/13/2014, 08/11/2004  FINDINGS: Diffuse left foot soft tissue swelling on the lateral view. Bones are osteopenic. Degenerative changes of the left  talonavicular articulation with joint space loss, sclerosis and subchondral cyst formation. Normal alignment without acute fracture.  IMPRESSION: Soft tissue swelling.  Midfoot osteoarthritis  No acute osseous finding or fracture   Electronically Signed   By: Daryll Brod M.D.   On: 04/13/2014 19:00   Medications: I have reviewed the patient's current medications. Scheduled Meds: . antiseptic oral rinse  15 mL Mouth Rinse q12n4p  . carvedilol  25 mg Per Tube BID WC  . chlorhexidine  15 mL Mouth Rinse BID  . cloNIDine  0.1 mg Transdermal Weekly  . DAPTOmycin (CUBICIN)  IV  500 mg Intravenous Q48H  . darbepoetin (ARANESP) injection - NON-DIALYSIS  100 mcg Subcutaneous Q Tue-1800  . fluconazole  100 mg Oral Daily  . furosemide  80 mg Intravenous Once  . heparin subcutaneous  5,000 Units Subcutaneous 3 times per day  . hydrALAZINE  25 mg Oral 3 times per day  . insulin aspart  0-15 Units Subcutaneous 6 times per day  . meropenem (MERREM) IV  500 mg Intravenous Q24H  . polyethylene glycol  17 g Per Tube Daily  . sodium bicarbonate  1,300 mg Per Tube TID   Continuous Infusions: . feeding supplement (VITAL AF 1.2 CAL) 1,000 mL (04/14/14 1738)   PRN Meds:.acetaminophen (TYLENOL) oral liquid 160 mg/5 mL, levalbuterol, sodium chloride Assessment/Plan:  Acute Encephalopathy in setting of Non-convulsive Status Epilepticus & Metabolic Toxicity - Minimally responsive, however improved from yesterday. No recent seizure activity. Last EEG on 5/25. MRI on 5/27 normal.  Etiology thought to be due to cefepime neuro toxicity complicated by uremia.  -Appreciate neurology recommendations -Frequent neuro checks -Monitor for seizures, currently holding AED   -Avoid sedative medications  -Hold home cymbalta 60 mg daily and norco 10-325 mg Q 6 hr PRN   -Continue IV daptomycin and IV meropenem per ID for osteomyelitis  -Per pharmacy, cefepime toxicity may take 17 days to return to baseline mental  status -Palliative consult if no improvement and family agreeable   Pulmonary Edema - Currently volume overloaded in setting of IVF's for acute pancreatitis.  -Obtain 2-view CXR --> Enlargement of cardiac silhouette with pulmonary vascular congestion -Increase IV lasix 20 mg to 80 mg    Acute on Chronic Grade 1 Diastolic CHF -  Last 2D -echo on 12/17/13 with EF 60-65% and grade 1 diastolic dysfunction.   -Hold  home torsemide 20 mg daily  -Hold home imdur 15 mg daily  -IV lasix 80 mg   -Repeat pro-BNP levels---> 13216 (worse from 6433 on 5/17) -Monitor daily weights and strict I & O's   Hyperkalemia - K 5.7 this AM. Etiology most likely due to worsening renal failure. Magnesium also elevated at 2.6. -Administer kayexalate as needed -Monitor BMP  Nonoliguric AKI on CKD Stage 4 - Cr 5.88, above 4.29 on admission and above baseline 1.9. Etiology likely due to intrinsic renal (FeNa 4%) due to cefepime toxicity and very mild rhabdomyolysis from status epiliepticus (CK initially elevated).   -Appreciate nephrology recommendations -Avoid nephrotoxins  -Hold home torsemide 20 mg daily  -IV lasix 80 mg  -Monitor BMP  -Keep foley catheter  -No HD at this time per nephrology   Metabolic Acidosis - Bicarbonate 16 below baseline. Etiology likely due to acute renal failure.  -Continue sodium bicarbonate 1300 mg TID per tube  Destructive Osteomyelitis of T10-T11 - ESR & CRP elevated from admission.  Diagnosed Jan 2015. Pt has received 2 weeks of IV ceftriaxone and vancomycin; doxycyline and amoxicillin; and now IV cefepime and daptomycin since 5/4 -5/16. CT revealing destructive process at T10-T11 secondary to osteomyelitis and discitis. PICC line placed about 3 weeks ago.  -Appreciate ID recommendations -Continue IV daptomycin (started 5/26) and IV meropenem (started on 5/27) -Obtain PCT level ---> 0.67 -Monitor CK level while on daptomycin -Follow leukocytosis and fever curve -Monitor PCT for  response -Monitor PICC line for signs of infection -Outpatient ID F/U scheduled 04/26/14  History of gout with left ankle pain and swelling and hyperuricemia - Pt with left foot swelling and tenderness on exam. Pt was not on gout medications at home. Xray foot/left ankle on 5/26 with soft tissue swelling, midfoot OA, and no acute fracture.  -SQ heparin TID and SCD's for DVT Ppx -Consider colchicine 1.2 mg x 1 then 0.6 mg 1 hr later (no adjustment for CrCl <30),  CrCl currently 8.1   Acute on Chronic Normocytic Anemia - Hg 8.6 near baseline Hg 9 with no hemodynamic instability or active bleeding. Pt received 2 pRBC's on 5/24 due to Hg 6.8.  Anemia panel 5/17 with reticulocytes (wnl), ferritin(253), iron (33 L), TIB (135 L), Iron sat (24%), folate (wnl), B12 (wnl). Etiology possibly due to autoimmune hemolytic anemia in setting of recent antibiotics (Coombs test +).  -Monitor for bleeding -Monitor CBC and transfuse if Hg <7    -Aranesp weekly (Tuesdays) per renal   Severe protein malnutrition and hypoalbuminemia - Pt with low albumin levels in setting acute renal failure and infection.  -Appreciate nutrition consult -Continue tube feedings -Discussion with family regarding long-term feeding options if continued AMS  Complicated Cystitis - UA with large leukocytes, pyuria (too numerous), few bacteriuria, and many yeast. Urine cultures with  > 100 K yeast -No antibiotics indicated -Continue flucanazole 100 mg Day 5/7  -Keep foley catheter   Acute Non-Biliary Pancreatitis - Improved/resolved. Last CT abd on 5/26 with resolved edema and fluid about the proximal pancreas. Etiology unclear, possibly due to uncontrolled hypercalcemia.   -Appreciate GI recommendations -Diet NPO, feeding supplements per tube  Chronic Hypercalcemia in setting of secondary hyperparathyroidism with vitamin D insufficiency - Corrected calcium high 11.8.  Last PTH with 241.9 (H) on 5/26. Vitamin D25-OH (23 L) on 04/02/2014.   Also questionable sarcoidosis per pulmonology. Pt received  IV pamidronate x 1 on 5/25  -Continue to monitor   Hypertension - Currently normotensive.    -  Continue clonidine 0.1 mg patch weekly (at home on clonidine 0.1 mg BID) -Continue hydralazine 25 mg TID (at home on 50 mg QID) -Continue home carvedilol 25 mg BID  -Hold home torsemide 20 mg daily  -Hold home imdur 15 mg daily   Constipation - CT abd on 5/26 with large amount of stool in the rectum. -Miralax daily   Non-insulin dependent Type II DM - Last A1c of 6 on 5/16. Pt does not appear to be on medications at home.  -Sensitive SSI  -CBG monitoring Q 4 hrs while NPO   Hypokalemia - Resolved.  Magnesium levels normal. Etiology due to decreased PO intake. -Replete with caution in setting of CKD -Monitor BMP  Diet: NPO, tube feeding  DVT Ppx: SQ Heparin TID, SCD's  Code: Full  Dispo: SNF once medically stable   The patient does have a current PCP (No Pcp Per Patient) and does need an [facility] hospital follow-up appointment after discharge.  The patient does have transportation limitations that hinder transportation to clinic appointments.  .Services Needed at time of discharge: Y = Yes, Blank = No PT:   OT:   RN:   Equipment:   Other:     LOS: 12 days   Juluis Mire, MD 04/15/2014, 10:56 AM

## 2014-04-15 NOTE — Progress Notes (Signed)
Subjective: Interval History: none.  Objective: Vital signs in last 24 hours: Temp:  [97.8 F (36.6 C)-98.4 F (36.9 C)] 98.4 F (36.9 C) (05/28 0400) Pulse Rate:  [65-98] 72 (05/28 0400) Resp:  [13-17] 14 (05/28 0400) BP: (118-156)/(37-64) 126/64 mmHg (05/28 0549) SpO2:  [95 %-97 %] 95 % (05/28 0400) Weight:  [100.971 kg (222 lb 9.6 oz)] 100.971 kg (222 lb 9.6 oz) (05/28 0400) Weight change: 5.971 kg (13 lb 2.6 oz)  Intake/Output from previous day: 05/27 0701 - 05/28 0700 In: 2370 [NG/GT:2320; IV Piggyback:50] Out: 800 [Urine:800] Intake/Output this shift:    General appearance: morbidly obese and respond to physical stim only Resp: diminished breath sounds bilaterally and rhonchi bilaterally Cardio: S1, S2 normal and systolic murmur: holosystolic 2/6, blowing at apex GI: obese, pos bs, soft Extremities: edema 2+  Lab Results:  Recent Labs  04/14/14 0500 04/15/14 0610  WBC 9.9 9.3  HGB 8.6* 8.7*  HCT 25.7* 25.7*  PLT 208 182   BMET:  Recent Labs  04/13/14 0430 04/14/14 0500  NA 142 140  K 4.9 5.3  CL 107 104  CO2 18* 17*  GLUCOSE 151* 199*  BUN 106* 125*  CREATININE 5.41* 5.84*  CALCIUM 11.0* 10.3    Recent Labs  04/13/14 0430  PTH 241.9*   Iron Studies: No results found for this basename: IRON, TIBC, TRANSFERRIN, FERRITIN,  in the last 72 hours  Studies/Results: Ct Abdomen Pelvis Wo Contrast  04/13/2014   CLINICAL DATA:  Pancreatitis, renal insufficiency, 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  COMPARISON:  03/25/2014  FINDINGS: Limited exam without IV contrast and diffuse motion artifact.  Lower chest: Nodular bibasilar opacities noted, slightly worse suspicious for bibasilar pneumonia versus aspiration. Heart is enlarged. No pericardial effusion. No significant pleural effusion.  Abdomen:  Feeding tube terminates in the distal stomach. Prior cholecystectomy noted. No gross biliary dilatation. There is interval improvement in the previously described strandy edema and fluid about the proximal pancreatic head. Findings compatible with resolved pancreatitis. No peripancreatic fluid collection to suggest pseudocyst or abscess. No ductal dilatation appreciated. Kidneys demonstrate low-density cystic lesions bilaterally compatible with renal cysts. No renal obstruction or hydronephrosis.  Aortic atherosclerosis noted without aneurysm.  Negative for bowel obstruction, dilatation, or ileus. Normal appendix demonstrated.  No abdominal fluid collection, abscess, hemorrhage, or adenopathy.  Pelvis: Large amount of stool in the rectum. No pelvic fluid collection, hemorrhage, definite abscess, inguinal abnormality, or hernia. Bladder is collapsed by Foley catheter.  T10-11 osseous destructive process involving the disc space noted compatible with known spondylo discitis. Degenerative changes elsewhere in the thoracic and lumbar spine.  IMPRESSION: Limited exam with motion artifact.  Resolved edema and fluid about the proximal pancreas, compatible with improving pancreatitis.  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   On: 04/13/2014 18:27   Dg Chest 2 View  04/15/2014   CLINICAL DATA:  Shortness of breath, cough, question pneumonia  EXAM: CHEST  2 VIEW  COMPARISON:  04/04/2014  FINDINGS: Rotated to the RIGHT.  RIGHT arm PICC line tip projects over SVC.  Enlargement of cardiac silhouette with mild pulmonary vascular congestion.  Atherosclerotic calcification of a tortuous thoracic aorta.  Persistent pulmonary infiltrates bilaterally suggesting pulmonary edema.  Atelectasis at medial  LEFT base.  No pleural effusion or pneumothorax.  Multilevel endplate spur formation thoracic  spine.  IMPRESSION: Enlargement of cardiac silhouette with pulmonary vascular congestion.  Persistent pulmonary infiltrates question pulmonary edema.   Electronically Signed   By: Lavonia Dana M.D.   On: 04/15/2014 03:11   Dg Ankle Complete Left  04/13/2014   CLINICAL DATA:  Left ankle and foot pain  EXAM: LEFT ANKLE COMPLETE - 3+ VIEW  COMPARISON:  04/13/2014  FINDINGS: Bones are osteopenic. Normal left ankle alignment without fracture. Distal tibia, fibula, talus and calcaneus appear intact. Vascular calcifications noted. Degenerative changes of the talonavicular articulation.  IMPRESSION: No acute osseous finding.   Electronically Signed   By: Daryll Brod M.D.   On: 04/13/2014 18:57   Mr Brain Wo Contrast  04/14/2014   CLINICAL DATA:  80 year old female with altered mental status. Abnormal EEG. Initial encounter.  EXAM: MRI HEAD WITHOUT CONTRAST  TECHNIQUE: Multiplanar, multiecho pulse sequences of the brain and surrounding structures were obtained without intravenous contrast.  COMPARISON:  Brain MRI 04/04/2014.  Head CT April 26, 2014.  FINDINGS: Study is intermittently degraded by motion artifact despite repeated imaging attempts.  No restricted diffusion to suggest acute infarction. No midline shift, mass effect, evidence of mass lesion, ventriculomegaly, extra-axial collection or acute intracranial hemorrhage. Cervicomedullary junction and pituitary are within normal limits. Major intracranial vascular flow voids are stable. Stable gray and white matter signal in the brain. Tentorial calcifications thought responsible for the T2* foci on series 10, image 10. Essentially normal for age gray and white matter signal. Stable visualized upper cervical spine. Stable orbits, paranasal sinuses, mastoids, and scalp soft tissues.  IMPRESSION: Stable and essentially normal for age non contrast MRI appearance of the brain. Today's study is intermittently motion degraded.   Electronically Signed   By: Lars Pinks M.D.    On: 04/14/2014 10:49   Dg Foot Complete Left  04/13/2014   CLINICAL DATA:  Left ankle and foot pain  EXAM: LEFT FOOT - COMPLETE 3+ VIEW  COMPARISON:  04/13/2014, 08/11/2004  FINDINGS: Diffuse left foot soft tissue swelling on the lateral view. Bones are osteopenic. Degenerative changes of the left talonavicular articulation with joint space loss, sclerosis and subchondral cyst formation. Normal alignment without acute fracture.  IMPRESSION: Soft tissue swelling.  Midfoot osteoarthritis  No acute osseous finding or fracture   Electronically Signed   By: Daryll Brod M.D.   On: 04/13/2014 19:00    I have reviewed the patient's current medications.  Assessment/Plan: 1 AKI/CKD chem P . Bicarb started.  If concerned of vol xs, rec Lasix 160mg  iv to help comfort. bicarb may help breathing comfort 2 AMS no change 3 Obesity 4 Osteo of spinne  5 Anemia on epo  6 Pulm fibrosis 7 OSA P conservative mgmt, discussed with HS, comfort, review chem  LOS: 12 days   Tidus Upchurch L Michiel Sivley 04/15/2014,9:17 AM

## 2014-04-15 NOTE — Progress Notes (Signed)
CSW spoke to [name] [name] to follow up with insurance auth. [place] still waiting on insurance auth. Per MD, patient is not medically stable for dc today. CSW continues to follow.  [name], MSW, [place]

## 2014-04-15 NOTE — Progress Notes (Signed)
NUTRITION CONSULT/FOLLOW UP  DOCUMENTATION CODES Per approved criteria  -Severe malnutrition in the context of acute illness or injury -Obesity Unspecified   INTERVENTION: Continue current TF regimen RD to follow for nutrition care plan  NUTRITION DIAGNOSIS: Inadequate oral intake related to inability to eat as evidenced by NPO status, ongoing  Goal: Pt to meet >/= 90% of their estimated nutrition needs, met  Monitor:  TF regimen & tolerance, weight, labs, I/O's  ASSESSMENT: 80 year old female with PMH of HTN, non-insulin dependent Type II DM, CHF, CKD Stage 4, and osteomyelitis; presented from [place] facility with altered mental status.  MRI head without significant disease.  RD spoke with patient's daughter at bedside; report her Mom wasn't eating well for approximately 3 weeks PTA (at [facility]); pt would consume breakfast (eggs, bacon and coffee), however, not eat lunch or dinner; daughter also reports patient has lost 11 lbs during this time frame; per weight readings, pt has had a 14% weight loss since February 2015 (severe for time frame); patient does like Glucerna Shake oral supplement.  Patient transferred to 2W-Cardiac from 2C-Stepdown 5/27.  Vital AF 1.2 formula continues to infuse at 60 ml/hr via NGT (tip in distal stomach) providing 1728 kcal, 108 grams of protein, and 1167 ml of free water.  Neurology notes reviewed.  S/p EEG 5/25.  Patient remains minimally responsive.  RD consulted for low albumin.  Albumin has a half-life of 21 days and is strongly affected by stress response and inflammatory process, therefore, do not expect to see an improvement in this lab value during acute hospitalization.  Height: Ht Readings from Last 1 Encounters:  03/20/2014 5' 2"  (1.575 m)    Weight: Wt Readings from Last 1 Encounters:  04/15/14 222 lb 9.6 oz (100.971 kg)    BMI:  Body mass index is 40.7 kg/(m^2).  Estimated Nutritional Needs: Kcal:  1600-1800 Protein: 95-110 gm Fluid: >1.5 L/d  Skin: Intact  Diet Order: NPO   Intake/Output Summary (Last 24 hours) at 04/15/14 0951 Last data filed at 04/15/14 3825  Gross per 24 hour  Intake   2340 ml  Output    800 ml  Net   1540 ml    Labs:   Recent Labs Lab 04/09/14 0420  04/12/14 0430 04/13/14 0430 04/14/14 0500 04/15/14 0610  NA 143  < > 141 142 140  --   K 3.8  < > 4.3 4.9 5.3  --   CL 108  < > 108 107 104  --   CO2 22  < > 19 18* 17*  --   BUN 55*  < > 88* 106* 125*  --   CREATININE 4.65*  < > 5.15* 5.41* 5.84*  --   CALCIUM 10.7*  < > 10.9* 11.0* 10.3  --   MG 2.3  --   --   --   --  2.6*  PHOS 3.3  < > 2.8 2.8 2.7  --   GLUCOSE 140*  < > 139* 151* 199*  --   < > = values in this interval not displayed.  CBG (last 3)   Recent Labs  04/14/14 2330 04/15/14 0410 04/15/14 0803  GLUCAP 186* 189* 200*    Scheduled Meds: . antiseptic oral rinse  15 mL Mouth Rinse q12n4p  . carvedilol  25 mg Per Tube BID WC  . chlorhexidine  15 mL Mouth Rinse BID  . cloNIDine  0.1 mg Transdermal Weekly  . DAPTOmycin (CUBICIN)  IV  500  mg Intravenous Q48H  . darbepoetin (ARANESP) injection - NON-DIALYSIS  100 mcg Subcutaneous Q Tue-1800  . fluconazole  100 mg Oral Daily  . furosemide  80 mg Intravenous Once  . heparin subcutaneous  5,000 Units Subcutaneous 3 times per day  . hydrALAZINE  25 mg Oral 3 times per day  . insulin aspart  0-15 Units Subcutaneous 6 times per day  . meropenem (MERREM) IV  500 mg Intravenous Q24H  . polyethylene glycol  17 g Per Tube Daily  . sodium bicarbonate  1,300 mg Per Tube TID    Continuous Infusions: . feeding supplement (VITAL AF 1.2 CAL) 1,000 mL (04/14/14 1738)    Arthur Holms, RD, LDN Pager #: [phone] After-Hours Pager #: [phone]

## 2014-04-16 ENCOUNTER — Inpatient Hospital Stay (HOSPITAL_COMMUNITY): Payer: Medicare HMO

## 2014-04-16 DIAGNOSIS — M8960 Osteopathy after poliomyelitis, unspecified site: Secondary | ICD-10-CM

## 2014-04-16 DIAGNOSIS — E43 Unspecified severe protein-calorie malnutrition: Secondary | ICD-10-CM

## 2014-04-16 LAB — CBC WITH DIFFERENTIAL/PLATELET
Basophils Absolute: 0.1 10*3/uL (ref 0.0–0.1)
Basophils Relative: 1 % (ref 0–1)
EOS PCT: 2 % (ref 0–5)
Eosinophils Absolute: 0.2 10*3/uL (ref 0.0–0.7)
HCT: 25.8 % — ABNORMAL LOW (ref 36.0–46.0)
Hemoglobin: 8.9 g/dL — ABNORMAL LOW (ref 12.0–15.0)
Lymphocytes Relative: 21 % (ref 12–46)
Lymphs Abs: 2.4 10*3/uL (ref 0.7–4.0)
MCH: 29.2 pg (ref 26.0–34.0)
MCHC: 34.5 g/dL (ref 30.0–36.0)
MCV: 84.6 fL (ref 78.0–100.0)
Monocytes Absolute: 0.8 10*3/uL (ref 0.1–1.0)
Monocytes Relative: 7 % (ref 3–12)
NEUTROS PCT: 69 % (ref 43–77)
Neutro Abs: 7.9 10*3/uL — ABNORMAL HIGH (ref 1.7–7.7)
Platelets: 201 10*3/uL (ref 150–400)
RBC: 3.05 MIL/uL — AB (ref 3.87–5.11)
RDW: 16.3 % — ABNORMAL HIGH (ref 11.5–15.5)
WBC: 11.4 10*3/uL — ABNORMAL HIGH (ref 4.0–10.5)

## 2014-04-16 LAB — RENAL FUNCTION PANEL
Albumin: 1.6 g/dL — ABNORMAL LOW (ref 3.5–5.2)
BUN: 152 mg/dL — ABNORMAL HIGH (ref 6–23)
CHLORIDE: 106 meq/L (ref 96–112)
CO2: 18 mEq/L — ABNORMAL LOW (ref 19–32)
Calcium: 9.6 mg/dL (ref 8.4–10.5)
Creatinine, Ser: 5.9 mg/dL — ABNORMAL HIGH (ref 0.50–1.10)
GFR calc Af Amer: 7 mL/min — ABNORMAL LOW (ref >90)
GFR calc non Af Amer: 6 mL/min — ABNORMAL LOW (ref >90)
Glucose, Bld: 194 mg/dL — ABNORMAL HIGH (ref 70–99)
Phosphorus: 2.7 mg/dL (ref 2.3–4.6)
Potassium: 4.8 mEq/L (ref 3.7–5.3)
Sodium: 145 mEq/L (ref 137–147)

## 2014-04-16 LAB — GLUCOSE, CAPILLARY
GLUCOSE-CAPILLARY: 176 mg/dL — AB (ref 70–99)
Glucose-Capillary: 174 mg/dL — ABNORMAL HIGH (ref 70–99)
Glucose-Capillary: 182 mg/dL — ABNORMAL HIGH (ref 70–99)
Glucose-Capillary: 164 mg/dL — ABNORMAL HIGH (ref 70–99)
Glucose-Capillary: 163 mg/dL — ABNORMAL HIGH (ref 70–99)
Glucose-Capillary: 191 mg/dL — ABNORMAL HIGH (ref 70–99)

## 2014-04-16 MED ORDER — IOHEXOL 300 MG/ML  SOLN
50.0000 mL | Freq: Once | INTRAMUSCULAR | Status: AC | PRN
  Administered 2014-04-16: 40 mL

## 2014-04-16 NOTE — Progress Notes (Signed)
PT was working with pt, pt pulled panda tube partly out. RN advanced tubing further. Xray ordered. Rec. Tube be advanced 10cm further. Made dr. Naaman Plummer aware. RN advance tube, tubing began to visibly coil in pt throat. Pt fighting and pulling tube, ng tube out. Orders to reinsert tube.

## 2014-04-16 NOTE — Evaluation (Signed)
Physical Therapy Evaluation Patient Details Name: LEDIA HANFORD MRN: [id number] DOB: 1930/05/18 Today's Date: 04/16/2014   History of Present Illness  80 yo female with HTN, DM, pancreatitis, CKD4, vertebral osteomyelitis on IV abx ( recently changed to Cefepime ) admitted 5/16 with AMS.  Evaluated by neurology and continuous EEG revealed status epilepticus.  Clinical Impression  Pt admitted with above. Pt currently with functional limitations due to the deficits listed below (see PT Problem List).  Pt will benefit from skilled PT to increase their independence and safety with mobility to allow discharge to the venue listed below. Pt aroused to voice and with mobility. Answering some yes/no questions.      Follow Up Recommendations SNF    Equipment Recommendations  None recommended by PT    Recommendations for Other Services       Precautions / Restrictions Precautions Precautions: Fall      Mobility  Bed Mobility Overal bed mobility: Needs Assistance Bed Mobility: Supine to Sit;Sit to Supine     Supine to sit: +2 for physical assistance;Total assist;HOB elevated Sit to supine: +2 for physical assistance;Total assist   General bed mobility comments: Pt not assisting with any mobility.  Transfers                    Ambulation/Gait                Stairs            Wheelchair Mobility    Modified Rankin (Stroke Patients Only)       Balance Overall balance assessment: Needs assistance Sitting-balance support: Bilateral upper extremity supported Sitting balance-Leahy Scale: Zero Sitting balance - Comments: Pt sat EOB x 8 minutes with max A. Postural control: Posterior lean;Right lateral lean                                   Pertinent Vitals/Pain No signs of pain.    Home Living Family/patient expects to be discharged to:: Skilled nursing facility                 Additional Comments: Adm from [facility] and plans  to return    Prior Function Level of Independence: Needs assistance   Gait / Transfers Assistance Needed: Per goddaughter in room pt requires 2 person assist to get OOB at SNF.           Hand Dominance        Extremity/Trunk Assessment               Lower Extremity Assessment: RLE deficits/detail;LLE deficits/detail RLE Deficits / Details: Assist minimally with active assist LLE Deficits / Details: Assist minimally with active assist     Communication      Cognition Arousal/Alertness: Lethargic Behavior During Therapy: Flat affect Overall Cognitive Status: Difficult to assess                      General Comments      Exercises        Assessment/Plan    PT Assessment Patient needs continued PT services  PT Diagnosis Generalized weakness;Altered mental status   PT Problem List Decreased strength;Decreased activity tolerance;Decreased balance;Decreased mobility;Decreased cognition  PT Treatment Interventions Functional mobility training;Therapeutic activities;Therapeutic exercise;Balance training;Patient/family education   PT Goals (Current goals can be found in the Care Plan section) Acute Rehab PT Goals Patient Stated Goal: Pt didn't  state PT Goal Formulation: Patient unable to participate in goal setting Time For Goal Achievement: 04/30/14 Potential to Achieve Goals: Fair    Frequency Min 1X/week   Barriers to discharge        Co-evaluation               End of Session   Activity Tolerance: Patient limited by fatigue Patient left: in bed;with call bell/phone within reach;with bed alarm set;with family/visitor present Nurse Communication: Mobility status;Need for lift equipment         Time: [phone] PT Time Calculation (min): 15 min   Charges:   PT Evaluation $Initial PT Evaluation Tier I: 1 Procedure PT Treatments $Therapeutic Activity: 8-22 mins   PT G CodesShary Decamp Derotha Fishbaugh 04/16/2014, 10:24 AM  Suanne Marker PT [phone]

## 2014-04-16 NOTE — Progress Notes (Signed)
    Day 13 of stay      Patient name: [name] [name]  Medical record number: [id number]  Date of birth: [birth year]   Ms Kennebrew showed some more response to verbal cues. No overnight events.   Filed Vitals:   04/15/14 1405 04/15/14 2000 04/16/14 0423 04/16/14 0456  BP: 141/63 152/63  99/39  Pulse: 72 80  81  Temp: 98.8 F (37.1 C) 98.4 F (36.9 C)  97.7 F (36.5 C)  TempSrc: Axillary Oral  Oral  Resp: 18 18  18   Height:      Weight:   221 lb 1.6 oz (100.29 kg)   SpO2: 95% 98%  97%   No acute distress. Somnolent. But appears that she tries to follow verbal cues.  PERRL.  Moving all extremities.  Tender left ankle.   Labs and imaging reviewed.    Intake/Output Summary (Last 24 hours) at 04/16/14 1202 Last data filed at 04/16/14 0354  Gross per 24 hour  Intake      0 ml  Output   1350 ml  Net  -1350 ml     Assessment and Plan    We continue current treatment. I have discussed the care of this patient with my IM team residents. Please see the resident note for details.  Breona Cherubin 04/16/2014, 12:00 PM.

## 2014-04-16 NOTE — Progress Notes (Signed)
Subjective: Interval History: has no complaint.  Objective: Vital signs in last 24 hours: Temp:  [97.7 F (36.5 C)-98.8 F (37.1 C)] 97.7 F (36.5 C) (05/29 0456) Pulse Rate:  [72-81] 81 (05/29 0456) Resp:  [18] 18 (05/29 0456) BP: (99-152)/(39-63) 99/39 mmHg (05/29 0456) SpO2:  [95 %-98 %] 97 % (05/29 0456) Weight:  [100.29 kg (221 lb 1.6 oz)] 100.29 kg (221 lb 1.6 oz) (05/29 0423) Weight change: -0.68 kg (-1 lb 8 oz)  Intake/Output from previous day: 05/28 0701 - 05/29 0700 In: 0  Out: 1350 [Urine:1350] Intake/Output this shift: Total I/O In: -  Out: 950 [Urine:950]  General appearance: mouth a few words, not intelligible Resp: diminished breath sounds bilaterally and rhonchi bilaterally Cardio: S1, S2 normal and systolic murmur: holosystolic 2/6, blowing at apex GI: obese, pos bs, soft Extremities: edema 1+  Lab Results:  Recent Labs  04/15/14 0610 04/16/14 0450  WBC 9.3 11.4*  HGB 8.7* 8.9*  HCT 25.7* 25.8*  PLT 182 201   BMET:  Recent Labs  04/15/14 1630 04/16/14 0450  NA 142 145  K 5.1 4.8  CL 105 106  CO2 16* 18*  GLUCOSE 259* 194*  BUN 144* 152*  CREATININE 5.95* 5.90*  CALCIUM 9.9 9.6   No results found for this basename: PTH,  in the last 72 hours Iron Studies: No results found for this basename: IRON, TIBC, TRANSFERRIN, FERRITIN,  in the last 72 hours  Studies/Results: Dg Chest 2 View  04/15/2014   CLINICAL DATA:  Shortness of breath, cough, question pneumonia  EXAM: CHEST  2 VIEW  COMPARISON:  04/04/2014  FINDINGS: Rotated to the RIGHT.  RIGHT arm PICC line tip projects over SVC.  Enlargement of cardiac silhouette with mild pulmonary vascular congestion.  Atherosclerotic calcification of a tortuous thoracic aorta.  Persistent pulmonary infiltrates bilaterally suggesting pulmonary edema.  Atelectasis at medial LEFT base.  No pleural effusion or pneumothorax.  Multilevel endplate spur formation thoracic spine.  IMPRESSION: Enlargement of cardiac  silhouette with pulmonary vascular congestion.  Persistent pulmonary infiltrates question pulmonary edema.   Electronically Signed   By: Lavonia Dana M.D.   On: 04/15/2014 03:11   Mr Brain Wo Contrast  04/14/2014   CLINICAL DATA:  80 year old female with altered mental status. Abnormal EEG. Initial encounter.  EXAM: MRI HEAD WITHOUT CONTRAST  TECHNIQUE: Multiplanar, multiecho pulse sequences of the brain and surrounding structures were obtained without intravenous contrast.  COMPARISON:  Brain MRI 04/04/2014.  Head CT 04/02/2014.  FINDINGS: Study is intermittently degraded by motion artifact despite repeated imaging attempts.  No restricted diffusion to suggest acute infarction. No midline shift, mass effect, evidence of mass lesion, ventriculomegaly, extra-axial collection or acute intracranial hemorrhage. Cervicomedullary junction and pituitary are within normal limits. Major intracranial vascular flow voids are stable. Stable gray and white matter signal in the brain. Tentorial calcifications thought responsible for the T2* foci on series 10, image 10. Essentially normal for age gray and white matter signal. Stable visualized upper cervical spine. Stable orbits, paranasal sinuses, mastoids, and scalp soft tissues.  IMPRESSION: Stable and essentially normal for age non contrast MRI appearance of the brain. Today's study is intermittently motion degraded.   Electronically Signed   By: Lars Pinks M.D.   On: 04/14/2014 10:49    I have reviewed the patient's current medications.  Assessment/Plan: 1 CKD/AKI  Appears to have Cr plateaued.  BUN ^ with catabolic state.  Making more urine . Need to avoid low bps. Pos 9 l  this admit . Bicarb stabilized to better. 2 SZ on meds, high risk with uremia 3 Osteo of spine 4 Pulm Htn 5 Obesity 6 debill  7 OSA Rec stop hydral, allow to diurese on her own if will do, so. Cont bicarb.   LOS: 13 days   Vishruth Seoane L Tiffay Pinette 04/16/2014,6:49 AM

## 2014-04-16 NOTE — Progress Notes (Signed)
[facility] is attempting to seek [facility] authorization for patient but require updated therapy notes.  PT signed off on patient on 04/07/14. CSW contacted unit charge nurse who will facilitate PT order. CSW notified Larene Beach in the PT office and she stated that they would follow up with patient this morning.  Once PT note is in place- will forward additional clinicals to [facility] to assist with [place] Staggers. Lorie Phenix. [place], [place]

## 2014-04-16 NOTE — Progress Notes (Addendum)
Subjective:  Pt seen and examined in AM. No seizure activity overnight. Pt speaking and said "pain all over" and "good." Trying to open eyes and was able to raise right arm. Still with left ankle pain.    Objective: Vital signs in last 24 hours: Filed Vitals:   04/15/14 1405 04/15/14 2000 04/16/14 0423 04/16/14 0456  BP: 141/63 152/63  99/39  Pulse: 72 80  81  Temp: 98.8 F (37.1 C) 98.4 F (36.9 C)  97.7 F (36.5 C)  TempSrc: Axillary Oral  Oral  Resp: 18 18  18   Height:      Weight:   100.29 kg (221 lb 1.6 oz)   SpO2: 95% 98%  97%   Weight change: -0.68 kg (-1 lb 8 oz)  Intake/Output Summary (Last 24 hours) at 04/16/14 1025 Last data filed at 04/16/14 0354  Gross per 24 hour  Intake      0 ml  Output   1350 ml  Net  -1350 ml   Physical Examination: General: Alert at times   Eyes: PERRL, tries to open eyes Neck: Normal ROM. Neck supple.  Heart: Normal rate and regular rhythm.  Lungs: Breathing comfortably on RA. Ronchi in anterior fields. No wheezing, ronchi, or rales.  Abdominal: Soft, non-distended, non-tender, minimal BS. No rebound or guarding.  Extremities: SCD's. Left ankle swelling and tenderness to palpation. Neurological:  Arousable to voice and trying to communicate. Withdraws to painful stimuli.  Tries to follow commands.     Lab Results: Basic Metabolic Panel:  Recent Labs Lab 04/15/14 0610 04/15/14 0855 04/15/14 1630 04/16/14 0450  NA  --  142 142 145  K  --  5.7* 5.1 4.8  CL  --  105 105 106  CO2  --  16* 16* 18*  GLUCOSE  --  235* 259* 194*  BUN  --  139* 144* 152*  CREATININE  --  5.89* 5.95* 5.90*  CALCIUM  --  9.7 9.9 9.6  MG 2.6*  --   --   --   PHOS  --  2.9  --  2.7   Liver Function Tests:  Recent Labs Lab 04/11/14 0500  04/14/14 0500 04/15/14 0855 04/16/14 0450  AST  --   --  39*  --   --   ALT  --   --  24  --   --   ALKPHOS  --   --  68  --   --   BILITOT <0.2*  --  <0.2*  --   --   PROT  --   --  6.3  --    --   ALBUMIN 1.6*  < > 1.5*  1.5* 1.4* 1.6*  < > = values in this interval not displayed.  Recent Labs Lab 04/12/14 0430  LIPASE 89*   CBC:  Recent Labs Lab 04/14/14 0500 04/15/14 0610 04/16/14 0450  WBC 9.9 9.3 11.4*  NEUTROABS 7.1  --  7.9*  HGB 8.6* 8.7* 8.9*  HCT 25.7* 25.7* 25.8*  MCV 84.8 85.1 84.6  PLT 208 182 201   Cardiac Enzymes:  Recent Labs Lab 04/10/14 0500  CKTOTAL 26   CBG:  Recent Labs Lab 04/15/14 1126 04/15/14 1613 04/15/14 2013 04/15/14 2358 04/16/14 0453 04/16/14 0809  GLUCAP 196* 229* 224* 176* 174* 182*   Urinalysis:  Recent Labs Lab 04/10/14 0947  COLORURINE YELLOW  LABSPEC 1.018  PHURINE 5.5  GLUCOSEU NEGATIVE  HGBUR MODERATE*  BILIRUBINUR NEGATIVE  KETONESUR NEGATIVE  PROTEINUR 100*  UROBILINOGEN 0.2  NITRITE NEGATIVE  LEUKOCYTESUR LARGE*     Studies/Results: Dg Chest 2 View  04/15/2014   CLINICAL DATA:  Shortness of breath, cough, question pneumonia  EXAM: CHEST  2 VIEW  COMPARISON:  04/04/2014  FINDINGS: Rotated to the RIGHT.  RIGHT arm PICC line tip projects over SVC.  Enlargement of cardiac silhouette with mild pulmonary vascular congestion.  Atherosclerotic calcification of a tortuous thoracic aorta.  Persistent pulmonary infiltrates bilaterally suggesting pulmonary edema.  Atelectasis at medial LEFT base.  No pleural effusion or pneumothorax.  Multilevel endplate spur formation thoracic spine.  IMPRESSION: Enlargement of cardiac silhouette with pulmonary vascular congestion.  Persistent pulmonary infiltrates question pulmonary edema.   Electronically Signed   By: Lavonia Dana M.D.   On: 04/15/2014 03:11   Dg Chest Port 1 View  04/16/2014   CLINICAL DATA:  Feeding tube placement  EXAM: PORTABLE CHEST - 1 VIEW  COMPARISON:  Apr 14, 2014  FINDINGS: Feeding tube tip is in the stomach immediately below the gastroesophageal junction. There is a central catheter with the tip at the cavoatrial junction. No pneumothorax.  There  is a slight degree of interstitial edema. Lungs elsewhere clear. Heart is enlarged with pulmonary vascularity within normal limits. Note that patient is rotated.  IMPRESSION: Feeding tube tip is just beyond the gastroesophageal junction. It may be prudent to consider advancing the tube for 8-10 cm to have the tube tip well within the confines of the stomach.  There is mild edema and cardiomegaly consistent with mild congestive heart failure. There is no appreciable airspace consolidation.   Electronically Signed   By: Lowella Grip M.D.   On: 04/16/2014 10:14   Medications: I have reviewed the patient's current medications. Scheduled Meds: . antiseptic oral rinse  15 mL Mouth Rinse q12n4p  . carvedilol  25 mg Per Tube BID WC  . chlorhexidine  15 mL Mouth Rinse BID  . cloNIDine  0.1 mg Transdermal Weekly  . DAPTOmycin (CUBICIN)  IV  500 mg Intravenous Q48H  . darbepoetin (ARANESP) injection - NON-DIALYSIS  100 mcg Subcutaneous Q Tue-1800  . fluconazole  100 mg Oral Daily  . furosemide  80 mg Intravenous BID  . heparin subcutaneous  5,000 Units Subcutaneous 3 times per day  . hydrALAZINE  25 mg Oral 3 times per day  . insulin aspart  0-15 Units Subcutaneous 6 times per day  . meropenem (MERREM) IV  500 mg Intravenous Q24H  . polyethylene glycol  17 g Per Tube Daily  . sodium bicarbonate  1,300 mg Per Tube TID   Continuous Infusions: . feeding supplement (VITAL AF 1.2 CAL) 1,000 mL (04/14/14 1738)   PRN Meds:.acetaminophen (TYLENOL) oral liquid 160 mg/5 mL, levalbuterol, sodium chloride Assessment/Plan:  Acute Encephalopathy in setting of Non-convulsive Status Epilepticus & Metabolic Toxicity -  Improving mental status after discontinuation of AED on 5/27.  No recent seizure activity. Last EEG on 5/25. MRI on 5/27 normal. Etiology thought to be due to cefepime neuro toxicity complicated by uremia.  -Appreciate neurology recommendations -Frequent neuro checks -Monitor for seizures,  currently holding AED   -Avoid sedative medications   -Hold home cymbalta 60 mg daily and norco 10-325 mg Q 6 hr PRN   -Continue IV daptomycin and IV meropenem per ID for osteomyelitis  -Per pharmacy, cefepime toxicity may take 17 days to return to baseline mental status -Obtain beside swallow evaluation   Acute on Chronic Grade 1 Diastolic CHF -  Last 2D -echo on 12/17/13 with EF 60-65% and grade 1 diastolic dysfunction.   -Hold home torsemide 20 mg daily  -Hold home imdur 15 mg daily  -Continue IV lasix 80 mg BID  -Monitor daily weights and strict I & O's   Nonoliguric AKI on CKD Stage 4 - Cr 5.90, above 4.29 on admission and above baseline 1.9. Etiology likely due to intrinsic renal (FeNa 4%) due to cefepime toxicity and very mild rhabdomyolysis from status epiliepticus (CK initially elevated).   -Appreciate nephrology recommendations -Avoid nephrotoxins  -Hold home torsemide 20 mg daily  -Continue IV lasix 80 mg BID -Monitor BMP  -Keep foley catheter  -No HD at this time per nephrology   Metabolic Acidosis - Bicarbonate 18 (L)  below baseline. Etiology likely due to acute renal failure.  -Continue sodium bicarbonate 1300 mg TID per tube  Destructive Osteomyelitis of T10-T11 - ESR & CRP elevated from admission.  Diagnosed Jan 2015. Pt has received 2 weeks of IV ceftriaxone and vancomycin; doxycyline and amoxicillin; and now IV cefepime and daptomycin since 5/4 -5/16. CT revealing destructive process at T10-T11 secondary to osteomyelitis and discitis. PICC line placed about 3 weeks ago.  -Appreciate ID recommendations -Continue IV daptomycin (started 5/26) and IV meropenem (started on 5/27) for total 6 weeks  -Monitor CK level while on daptomycin -Follow leukocytosis and fever curve -Monitor PICC line for signs of infection -Outpatient ID F/U scheduled 04/26/14  History of gout with left ankle pain and swelling and hyperuricemia - Pt with left foot swelling and tenderness on exam. Pt  was not on gout medications at home. Xray foot/left ankle on 5/26 with soft tissue swelling, midfoot OA, and no acute fracture.  -SQ heparin TID and SCD's for DVT Ppx -Consider colchicine 1.2 mg x 1 then 0.6 mg 1 hr later (no adjustment for CrCl <30),  CrCl currently 8.3   Acute on Chronic Normocytic Anemia - Hg 8.9 near baseline Hg 9 with no hemodynamic instability or active bleeding. Pt received 2 pRBC's on 5/24 due to Hg 6.8.  Anemia panel 5/17 with reticulocytes (wnl), ferritin(253), iron (33 L), TIB (135 L), Iron sat (24%), folate (wnl), B12 (wnl). Etiology possibly due to autoimmune hemolytic anemia in setting of recent antibiotics (Coombs test +).  -Monitor for bleeding -Monitor CBC and transfuse if Hg <7    -Aranesp weekly (Tuesdays) per renal   Severe protein malnutrition and hypoalbuminemia - Pt with low albumin levels in setting acute renal failure and infection.  -Appreciate nutrition consult -Obtain bedside swallow evaluation  -Continue tube feedings  Complicated Cystitis - UA with large leukocytes, pyuria (too numerous), few bacteriuria, and many yeast. Urine cultures with  > 100 K yeast -No antibiotics indicated -Continue flucanazole 100 mg Day 6/7  -Keep foley catheter   Acute Non-Biliary Pancreatitis - Improved/resolved. Last CT abd on 5/26 with resolved edema and fluid about the proximal pancreas. Etiology unclear, possibly due to uncontrolled hypercalcemia.   -Appreciate GI recommendations -Obtain swallow evaluation  -Diet NPO, continue tube feeding supplements per tube  Chronic Hypercalcemia in setting of secondary hyperparathyroidism with vitamin D insufficiency - Corrected calcium high 11.5.  Last PTH with 241.9 (H) on 5/26. Vitamin D25-OH (23 L) on 04/02/2014.  Also questionable sarcoidosis per pulmonology. Pt received  IV pamidronate x 1 on 5/25  -Continue to monitor   Hypertension - Currently normotensive.    -Continue clonidine 0.1 mg patch weekly (at home on  clonidine 0.1 mg BID) -Continue hydralazine 25 mg TID (  at home on 50 mg QID) -Continue home carvedilol 25 mg BID  -Continue IV lasix 80 BID  -Hold home torsemide 20 mg daily  -Hold home imdur 15 mg daily   Constipation - CT abd on 5/26 with large amount of stool in the rectum. -Miralax daily   Non-insulin dependent Type II DM - Last A1c of 6 on 5/16. Pt does not appear to be on medications at home.  -Sensitive SSI  -CBG monitoring Q 4 hrs while NPO   Hyperkalemia - Resolved. K 5.7 on 5/28. Etiology most likely due to worsening renal failure. Magnesium also elevated at 2.6. -Administer kayexalate as needed -Monitor BMP  Hypokalemia - Resolved.  Magnesium levels normal. Etiology due to decreased PO intake. -Replete with caution in setting of CKD -Monitor BMP  Diet: NPO, tube feeding  DVT Ppx: SQ Heparin TID, SCD's  Code: Full  Dispo: SNF once medically stable   The patient does have a current PCP (No Pcp Per Patient) and does need an [facility] hospital follow-up appointment after discharge.  The patient does have transportation limitations that hinder transportation to clinic appointments.  .Services Needed at time of discharge: Y = Yes, Blank = No PT:   OT:   RN:   Equipment:   Other:     LOS: 13 days   Juluis Mire, MD 04/16/2014, 10:25 AM

## 2014-04-16 NOTE — Progress Notes (Signed)
INFECTIOUS DISEASE PROGRESS NOTE  ID: [name] [name] is a 80 y.o. female with  Principal Problem:   Seizures Active Problems:   Osteomyelitis of thoracic spine   Acute encephalopathy   Pancreatitis, acute   Acute on chronic renal failure   Acute on chronic kidney failure   Non-convulsive status epilepticus   Protein-calorie malnutrition, severe   Anemia of chronic disease   Pulmonary vascular congestion  Subjective: Awakens and speaks  Abtx:  Anti-infectives   Start     Dose/Rate Route Frequency Ordered Stop   04/15/14 1700  DAPTOmycin (CUBICIN) 500 mg in sodium chloride 0.9 % IVPB     500 mg 220 mL/hr over 30 Minutes Intravenous Every 48 hours 04/13/14 1639     04/15/14 1515  DAPTOmycin (CUBICIN) 500 mg in sodium chloride 0.9 % IVPB  Status:  Discontinued     500 mg 220 mL/hr over 30 Minutes Intravenous Every 48 hours 04/13/14 1516 04/13/14 1639   04/14/14 1600  meropenem (MERREM) 500 mg in sodium chloride 0.9 % 50 mL IVPB     500 mg 100 mL/hr over 30 Minutes Intravenous Every 24 hours 04/14/14 1545     04/13/14 1515  DAPTOmycin (CUBICIN) 500 mg in sodium chloride 0.9 % IVPB  Status:  Discontinued     500 mg 220 mL/hr over 30 Minutes Intravenous Every 24 hours 04/13/14 1514 04/13/14 1516   04/11/14 1000  fluconazole (DIFLUCAN) 40 MG/ML suspension 100 mg     100 mg Oral Daily 04/11/14 0917 04/18/14 0959   04/11/14 1000  ciprofloxacin (CIPRO) 500 MG/5ML (10%) suspension 500 mg  Status:  Discontinued     500 mg Oral Daily 04/11/14 0917 04/12/14 1624   04/04/14 1000  ceFEPIme (MAXIPIME) 1 g in dextrose 5 % 50 mL IVPB  Status:  Discontinued     1 g 100 mL/hr over 30 Minutes Intravenous Every 24 hours 04/08/2014 1821 04/04/14 0931   04/04/14 1000  DAPTOmycin (CUBICIN) 530.5 mg in sodium chloride 0.9 % IVPB  Status:  Discontinued     6 mg/kg  88.4 kg 221.2 mL/hr over 30 Minutes Intravenous Every 24 hours 03/24/2014 1821 04/04/14 0138   04/10/2014 1230  ciprofloxacin (CIPRO) IVPB  400 mg     400 mg 200 mL/hr over 60 Minutes Intravenous  Once 03/29/2014 1225 04/02/2014 1423   04/01/2014 1200  cefTRIAXone (ROCEPHIN) 1 g in dextrose 5 % 50 mL IVPB  Status:  Discontinued     1 g 100 mL/hr over 30 Minutes Intravenous  Once 04/15/2014 1147 03/25/2014 1147   04/16/2014 1200  ceFEPIme (MAXIPIME) 2 g in dextrose 5 % 50 mL IVPB  Status:  Discontinued     2 g 100 mL/hr over 30 Minutes Intravenous Every 12 hours 04/12/2014 1148 03/24/2014 1225      Medications:  Scheduled: . antiseptic oral rinse  15 mL Mouth Rinse q12n4p  . carvedilol  25 mg Per Tube BID WC  . chlorhexidine  15 mL Mouth Rinse BID  . cloNIDine  0.1 mg Transdermal Weekly  . DAPTOmycin (CUBICIN)  IV  500 mg Intravenous Q48H  . darbepoetin (ARANESP) injection - NON-DIALYSIS  100 mcg Subcutaneous Q Tue-1800  . fluconazole  100 mg Oral Daily  . furosemide  80 mg Intravenous BID  . heparin subcutaneous  5,000 Units Subcutaneous 3 times per day  . hydrALAZINE  25 mg Oral 3 times per day  . insulin aspart  0-15 Units Subcutaneous 6 times per day  .  meropenem (MERREM) IV  500 mg Intravenous Q24H  . polyethylene glycol  17 g Per Tube Daily  . sodium bicarbonate  1,300 mg Per Tube TID    Objective: Vital signs in last 24 hours: Temp:  [97.7 F (36.5 C)-98.4 F (36.9 C)] 98 F (36.7 C) (05/29 1350) Pulse Rate:  [76-81] 76 (05/29 1350) Resp:  [18] 18 (05/29 1350) BP: (99-157)/(39-78) 157/78 mmHg (05/29 1350) SpO2:  [97 %-98 %] 97 % (05/29 1350) Weight:  [100.29 kg (221 lb 1.6 oz)] 100.29 kg (221 lb 1.6 oz) (05/29 0423)   General appearance: no distress Resp: clear to auscultation bilaterally Cardio: regular rate and rhythm GI: normal findings: bowel sounds normal and soft, non-tender  Lab Results  Recent Labs  04/15/14 0610  04/15/14 1630 04/16/14 0450  WBC 9.3  --   --  11.4*  HGB 8.7*  --   --  8.9*  HCT 25.7*  --   --  25.8*  NA  --   < > 142 145  K  --   < > 5.1 4.8  CL  --   < > 105 106  CO2  --   < >  16* 18*  BUN  --   < > 144* 152*  CREATININE  --   < > 5.95* 5.90*  < > = values in this interval not displayed. Liver Panel  Recent Labs  04/14/14 0500 04/15/14 0855 04/16/14 0450  PROT 6.3  --   --   ALBUMIN 1.5*  1.5* 1.4* 1.6*  AST 39*  --   --   ALT 24  --   --   ALKPHOS 68  --   --   BILITOT <0.2*  --   --   BILIDIR <0.2  --   --   IBILI NOT CALCULATED  --   --    Sedimentation Rate No results found for this basename: ESRSEDRATE,  in the last 72 hours C-Reactive Protein No results found for this basename: CRP,  in the last 72 hours  Microbiology: Recent Results (from the past 240 hour(s))  URINE CULTURE     Status: None   Collection Time    04/10/14  5:17 PM      Result Value Ref Range Status   Specimen Description URINE, CATHETERIZED   Final   Special Requests NONE   Final   Culture  Setup Time     Final   Value: 04/11/2014 03:48     Performed at [facility]     Final   Value: >=100,000 COLONIES/ML     Performed at [facility]   Culture     Final   Value: YEAST     Performed at [facility]   Report Status 04/12/2014 FINAL   Final    Studies/Results: Dg Chest 2 View  04/15/2014   CLINICAL DATA:  Shortness of breath, cough, question pneumonia  EXAM: CHEST  2 VIEW  COMPARISON:  04/04/2014  FINDINGS: Rotated to the RIGHT.  RIGHT arm PICC line tip projects over SVC.  Enlargement of cardiac silhouette with mild pulmonary vascular congestion.  Atherosclerotic calcification of a tortuous thoracic aorta.  Persistent pulmonary infiltrates bilaterally suggesting pulmonary edema.  Atelectasis at medial LEFT base.  No pleural effusion or pneumothorax.  Multilevel endplate spur formation thoracic spine.  IMPRESSION: Enlargement of cardiac silhouette with pulmonary vascular congestion.  Persistent pulmonary infiltrates question pulmonary edema.   Electronically Signed  By: Lavonia Dana M.D.   On: 04/15/2014 03:11   Dg Chest Port 1  View  04/16/2014   CLINICAL DATA:  Feeding tube placement  EXAM: PORTABLE CHEST - 1 VIEW  COMPARISON:  Apr 14, 2014  FINDINGS: Feeding tube tip is in the stomach immediately below the gastroesophageal junction. There is a central catheter with the tip at the cavoatrial junction. No pneumothorax.  There is a slight degree of interstitial edema. Lungs elsewhere clear. Heart is enlarged with pulmonary vascularity within normal limits. Note that patient is rotated.  IMPRESSION: Feeding tube tip is just beyond the gastroesophageal junction. It may be prudent to consider advancing the tube for 8-10 cm to have the tube tip well within the confines of the stomach.  There is mild edema and cardiomegaly consistent with mild congestive heart failure. There is no appreciable airspace consolidation.   Electronically Signed   By: Lowella Grip M.D.   On: 04/16/2014 10:14     Assessment/Plan: Discitis/Osteo  Aspirate January (-) on anbx, vanco/ceftriaxone  Repeat MRI 4-24- worsening of osteo/discitis T10-11. Started on Dapto/cefepime 5-4, stopped 5-16  Encephalopathy, seizures (due to cefepime)  ARF  Pancreatitis (lipase 1219 --> 89), resolved  Severe Protein Calorie Malnutrition  Total days of antibiotics: 3 dapto/merrem  Would continue her on dapto, merrem. Plan for 6 weeks.  Mental status somewhat improved after stopping depakote Agree with planning for SNF.  ID available if questions         [facility] Infectious Diseases (pager) [phone] [email or web address] 04/16/2014, 3:17 PM  LOS: 13 days   **Disclaimer: This note may have been dictated with voice recognition software. Similar sounding words can inadvertently be transcribed and this note may contain transcription errors which may not have been corrected upon publication of note.**

## 2014-04-16 NOTE — Progress Notes (Signed)
Subjective: No overnight adverse clinical events reported.  Objective: Current vital signs: BP 99/39  Pulse 81  Temp(Src) 97.7 F (36.5 C) (Oral)  Resp 18  Ht 5\' 2"  (1.575 m)  Wt 100.29 kg (221 lb 1.6 oz)  BMI 40.43 kg/m2  SpO2 97%  Neurologic Exam: Patient was awake but slightly lethargic. She was in no acute distress. She was able to respond verbally although speech was dysarthric, but intelligible. She was able to follow simple commands. Pupils were equal and reacted normally to light. Extraocular movements were intact with right left lateral gaze. No clear focal facial weakness was noted. Patient was able to move extremities slightly to command, appropriately.  Medications: I have reviewed the patient's current medications.  Assessment/Plan: Encephalopathic state, most likely secondary to metabolic and toxic etiologies. I suspect Depakote was a contributing factor to patient's obtundation. Patient's mental status has improved. She is awake and following commands appropriately as well as responding verbally.  No changes in current management recommended. Agree with plans for placement in a skilled nursing facility when patient is medically stable.  C.R. Nicole Kindred, MD [facility] [phone]  04/16/2014  10:42 AM

## 2014-04-17 ENCOUNTER — Encounter (HOSPITAL_COMMUNITY): Payer: Self-pay | Admitting: *Deleted

## 2014-04-17 DIAGNOSIS — E46 Unspecified protein-calorie malnutrition: Secondary | ICD-10-CM | POA: Diagnosis present

## 2014-04-17 LAB — RENAL FUNCTION PANEL
ALBUMIN: 1.4 g/dL — AB (ref 3.5–5.2)
BUN: 156 mg/dL — ABNORMAL HIGH (ref 6–23)
CALCIUM: 9.5 mg/dL (ref 8.4–10.5)
CO2: 18 mEq/L — ABNORMAL LOW (ref 19–32)
Chloride: 106 mEq/L (ref 96–112)
Creatinine, Ser: 5.95 mg/dL — ABNORMAL HIGH (ref 0.50–1.10)
GFR calc non Af Amer: 6 mL/min — ABNORMAL LOW (ref >90)
GFR, EST AFRICAN AMERICAN: 7 mL/min — AB (ref >90)
Glucose, Bld: 190 mg/dL — ABNORMAL HIGH (ref 70–99)
PHOSPHORUS: 3.2 mg/dL (ref 2.3–4.6)
POTASSIUM: 4.8 meq/L (ref 3.7–5.3)
SODIUM: 147 meq/L (ref 137–147)

## 2014-04-17 LAB — CBC WITH DIFFERENTIAL/PLATELET
BASOS ABS: 0.1 10*3/uL (ref 0.0–0.1)
Basophils Relative: 1 % (ref 0–1)
EOS ABS: 0.1 10*3/uL (ref 0.0–0.7)
Eosinophils Relative: 1 % (ref 0–5)
HCT: 24.3 % — ABNORMAL LOW (ref 36.0–46.0)
Hemoglobin: 8.2 g/dL — ABNORMAL LOW (ref 12.0–15.0)
LYMPHS ABS: 1.3 10*3/uL (ref 0.7–4.0)
Lymphocytes Relative: 11 % — ABNORMAL LOW (ref 12–46)
MCH: 28.6 pg (ref 26.0–34.0)
MCHC: 33.7 g/dL (ref 30.0–36.0)
MCV: 84.7 fL (ref 78.0–100.0)
MONOS PCT: 15 % — AB (ref 3–12)
Monocytes Absolute: 1.8 10*3/uL — ABNORMAL HIGH (ref 0.1–1.0)
NEUTROS ABS: 8.5 10*3/uL — AB (ref 1.7–7.7)
Neutrophils Relative %: 72 % (ref 43–77)
PLATELETS: 197 10*3/uL (ref 150–400)
RBC: 2.87 MIL/uL — ABNORMAL LOW (ref 3.87–5.11)
RDW: 16.4 % — AB (ref 11.5–15.5)
WBC: 11.8 10*3/uL — ABNORMAL HIGH (ref 4.0–10.5)

## 2014-04-17 LAB — GLUCOSE, CAPILLARY
GLUCOSE-CAPILLARY: 160 mg/dL — AB (ref 70–99)
GLUCOSE-CAPILLARY: 160 mg/dL — AB (ref 70–99)
GLUCOSE-CAPILLARY: 180 mg/dL — AB (ref 70–99)
Glucose-Capillary: 158 mg/dL — ABNORMAL HIGH (ref 70–99)
Glucose-Capillary: 158 mg/dL — ABNORMAL HIGH (ref 70–99)
Glucose-Capillary: 201 mg/dL — ABNORMAL HIGH (ref 70–99)
Glucose-Capillary: 183 mg/dL — ABNORMAL HIGH (ref 70–99)

## 2014-04-17 LAB — CK: CK TOTAL: 54 U/L (ref 7–177)

## 2014-04-17 NOTE — Progress Notes (Signed)
Subjective: Interval History: has no complaint.  Objective: Vital signs in last 24 hours: Temp:  [97.9 F (36.6 C)-98.1 F (36.7 C)] 98.1 F (36.7 C) (05/30 0420) Pulse Rate:  [73-88] 88 (05/30 0420) Resp:  [18-20] 18 (05/30 0420) BP: (157-165)/(63-78) 158/63 mmHg (05/30 0420) SpO2:  [97 %-100 %] 97 % (05/30 0420) Weight:  [95.573 kg (210 lb 11.2 oz)] 95.573 kg (210 lb 11.2 oz) (05/30 0654) Weight change: -4.717 kg (-10 lb 6.4 oz)  Intake/Output from previous day: 05/29 0701 - 05/30 0700 In: 0  Out: 800 [Urine:800] Intake/Output this shift:    General appearance: moderately obese and mouths some words, not intelligible Resp: diminished breath sounds bilaterally and rhonchi bibasilar Cardio: regular rate and rhythm and systolic murmur: holosystolic 2/6, blowing at apex GI: obese,pos bs, soft, getting NG TF Extremities: edema 1+  Lab Results:  Recent Labs  04/16/14 0450 04/17/14 0515  WBC 11.4* 11.8*  HGB 8.9* 8.2*  HCT 25.8* 24.3*  PLT 201 197   BMET:  Recent Labs  04/16/14 0450 04/17/14 0515  NA 145 147  K 4.8 4.8  CL 106 106  CO2 18* 18*  GLUCOSE 194* 190*  BUN 152* 156*  CREATININE 5.90* 5.95*  CALCIUM 9.6 9.5   No results found for this basename: PTH,  in the last 72 hours Iron Studies: No results found for this basename: IRON, TIBC, TRANSFERRIN, FERRITIN,  in the last 72 hours  Studies/Results: Dg Abd 1 View  04/16/2014   CLINICAL DATA:  Feeding catheter placement  EXAM: ABDOMEN - 1 VIEW  COMPARISON:  None.  FINDINGS: Feeding catheter was placed without difficulty into the stomach and advanced under fluoroscopic guidance into the proximal jejunum. 3 min of 51 seconds of fluoroscopy was utilized. 40 mL Omnipaque 350 was utilized to confirm catheter position.  IMPRESSION: Successful placement of a feeding catheter into the proximal jejunum.   Electronically Signed   By: Inez Catalina M.D.   On: 04/16/2014 15:55   Dg Chest Port 1 View  04/16/2014    CLINICAL DATA:  Feeding tube placement  EXAM: PORTABLE CHEST - 1 VIEW  COMPARISON:  Apr 14, 2014  FINDINGS: Feeding tube tip is in the stomach immediately below the gastroesophageal junction. There is a central catheter with the tip at the cavoatrial junction. No pneumothorax.  There is a slight degree of interstitial edema. Lungs elsewhere clear. Heart is enlarged with pulmonary vascularity within normal limits. Note that patient is rotated.  IMPRESSION: Feeding tube tip is just beyond the gastroesophageal junction. It may be prudent to consider advancing the tube for 8-10 cm to have the tube tip well within the confines of the stomach.  There is mild edema and cardiomegaly consistent with mild congestive heart failure. There is no appreciable airspace consolidation.   Electronically Signed   By: Lowella Grip M.D.   On: 04/16/2014 10:14   Dg Loyce Dys Tube Plc W/fl-no Rad  04/16/2014   CLINICAL DATA: replace panda ng tube.   NASO G TUBE PLACEMENT WITH FLUORO  Fluoroscopy was utilized by the requesting physician.  No radiographic  interpretation.     I have reviewed the patient's current medications.  Assessment/Plan: 1 AKI Cr plateau.  Bun ^, TF and catabolic state.  Would hold diuretics and see if will cont to diurese on  Her own.  2 Osteo of spine 3 Confusion some intermittent improvement 4 Anemia 5 Pulm HTN 6 ^ Ca better with 1 dose pamidronate. Still mild ^.  7 Nutrition TF 8 SZ stable Rec hold Lasix, cont epo, discussed with daughter.  AB .  Not offering much to your mgmt, will follow at a distance.    LOS: 14 days   [name] [name] 04/17/2014,7:13 AM

## 2014-04-17 NOTE — Progress Notes (Signed)
Subjective: Pt someone what talking when prompted but not really conversational.  She c/o abdominal pain but cant tell me where specifically or more details. Daughter at bedside and requests someone fill out her brother's FMLA when it gets faxed to [facility].   Objective: Vital signs in last 24 hours: Filed Vitals:   04/16/14 1350 04/16/14 2149 04/17/14 0420 04/17/14 0654  BP: 157/78 165/67 158/63   Pulse: 76 73 88   Temp: 98 F (36.7 C) 97.9 F (36.6 C) 98.1 F (36.7 C)   TempSrc: Oral Oral Oral   Resp: 18 20 18    Height:      Weight:    210 lb 11.2 oz (95.573 kg)  SpO2: 97% 100% 97%    Weight change: -10 lb 6.4 oz (-4.717 kg)  Intake/Output Summary (Last 24 hours) at 04/17/14 0729 Last data filed at 04/17/14 0658  Gross per 24 hour  Intake      0 ml  Output    800 ml  Net   -800 ml   Vitals reviewed. General: resting in bed, nad, moaning, and saying few simple words  HEENT: [place]/at, no scleral icterus Cardiac: RRR, no rubs, murmurs or gallops Pulm: clear to auscultation bilaterally, no wheezes, rales, or rhonchi (anteriorly) Abd: soft, nontender with deep palpation, nondistended, BS present Ext: warm and well perfused, 1 to 2+ pedal edema, 1+ edema b/l upper ext Neuro: eyes closed, moaning and saying few simple words intermittently, following commands intermittently  Lab Results: Basic Metabolic Panel:  Recent Labs Lab 04/15/14 0610  04/16/14 0450 04/17/14 0515  NA  --   < > 145 147  K  --   < > 4.8 4.8  CL  --   < > 106 106  CO2  --   < > 18* 18*  GLUCOSE  --   < > 194* 190*  BUN  --   < > 152* 156*  CREATININE  --   < > 5.90* 5.95*  CALCIUM  --   < > 9.6 9.5  MG 2.6*  --   --   --   PHOS  --   < > 2.7 3.2  < > = values in this interval not displayed. Liver Function Tests:  Recent Labs Lab 04/11/14 0500  04/14/14 0500  04/16/14 0450 04/17/14 0515  AST  --   --  39*  --   --   --   ALT  --   --  24  --   --   --   ALKPHOS  --   --  68  --   --    --   BILITOT <0.2*  --  <0.2*  --   --   --   PROT  --   --  6.3  --   --   --   ALBUMIN 1.6*  < > 1.5*  1.5*  < > 1.6* 1.4*  < > = values in this interval not displayed.  Recent Labs Lab 04/12/14 0430  LIPASE 89*    Recent Labs Lab 04/12/14 0900  AMMONIA 25   CBC:  Recent Labs Lab 04/16/14 0450 04/17/14 0515  WBC 11.4* 11.8*  NEUTROABS 7.9* 8.5*  HGB 8.9* 8.2*  HCT 25.8* 24.3*  MCV 84.6 84.7  PLT 201 197   Cardiac Enzymes:  Recent Labs Lab 04/17/14 0515  CKTOTAL 54   BNP:  Recent Labs Lab 04/15/14 0610  PROBNP 13216.0*   CBG:  Recent Labs  Lab 04/16/14 0809 04/16/14 1133 04/16/14 1557 04/16/14 2059 04/17/14 0009 04/17/14 0417  GLUCAP 182* 164* 163* 191* 160* 158*   Coagulation:  Recent Labs Lab 04/12/14 0430  LABPROT 15.3*  INR 1.24   Anemia Panel:  Recent Labs Lab 04/12/14 0430  RETICCTPCT 1.3   Urinalysis:  Recent Labs Lab 04/10/14 0947  COLORURINE YELLOW  LABSPEC 1.018  PHURINE 5.5  GLUCOSEU NEGATIVE  HGBUR MODERATE*  BILIRUBINUR NEGATIVE  KETONESUR NEGATIVE  PROTEINUR 100*  UROBILINOGEN 0.2  NITRITE NEGATIVE  LEUKOCYTESUR LARGE*   Misc. Labs: None   Micro Results: Recent Results (from the past 240 hour(s))  URINE CULTURE     Status: None   Collection Time    04/10/14  5:17 PM      Result Value Ref Range Status   Specimen Description URINE, CATHETERIZED   Final   Special Requests NONE   Final   Culture  Setup Time     Final   Value: 04/11/2014 03:48     Performed at [facility]     Final   Value: >=100,000 COLONIES/ML     Performed at [facility]   Culture     Final   Value: YEAST     Performed at [facility]   Report Status 04/12/2014 FINAL   Final   Studies/Results: Dg Abd 1 View  04/16/2014   CLINICAL DATA:  Feeding catheter placement  EXAM: ABDOMEN - 1 VIEW  COMPARISON:  None.  FINDINGS: Feeding catheter was placed without difficulty into the stomach and  advanced under fluoroscopic guidance into the proximal jejunum. 3 min of 51 seconds of fluoroscopy was utilized. 40 mL Omnipaque 350 was utilized to confirm catheter position.  IMPRESSION: Successful placement of a feeding catheter into the proximal jejunum.   Electronically Signed   By: Inez Catalina M.D.   On: 04/16/2014 15:55   Dg Chest Port 1 View  04/16/2014   CLINICAL DATA:  Feeding tube placement  EXAM: PORTABLE CHEST - 1 VIEW  COMPARISON:  Apr 14, 2014  FINDINGS: Feeding tube tip is in the stomach immediately below the gastroesophageal junction. There is a central catheter with the tip at the cavoatrial junction. No pneumothorax.  There is a slight degree of interstitial edema. Lungs elsewhere clear. Heart is enlarged with pulmonary vascularity within normal limits. Note that patient is rotated.  IMPRESSION: Feeding tube tip is just beyond the gastroesophageal junction. It may be prudent to consider advancing the tube for 8-10 cm to have the tube tip well within the confines of the stomach.  There is mild edema and cardiomegaly consistent with mild congestive heart failure. There is no appreciable airspace consolidation.   Electronically Signed   By: Lowella Grip M.D.   On: 04/16/2014 10:14   Dg Loyce Dys Tube Plc W/fl-no Rad  04/16/2014   CLINICAL DATA: replace panda ng tube.   NASO G TUBE PLACEMENT WITH FLUORO  Fluoroscopy was utilized by the requesting physician.  No radiographic  interpretation.    Medications: none  Scheduled Meds: . antiseptic oral rinse  15 mL Mouth Rinse q12n4p  . carvedilol  25 mg Per Tube BID WC  . chlorhexidine  15 mL Mouth Rinse BID  . cloNIDine  0.1 mg Transdermal Weekly  . DAPTOmycin (CUBICIN)  IV  500 mg Intravenous Q48H  . darbepoetin (ARANESP) injection - NON-DIALYSIS  100 mcg Subcutaneous Q Tue-1800  . fluconazole  100 mg Oral Daily  .  furosemide  80 mg Intravenous BID  . heparin subcutaneous  5,000 Units Subcutaneous 3 times per day  . hydrALAZINE  25  mg Oral 3 times per day  . insulin aspart  0-15 Units Subcutaneous 6 times per day  . meropenem (MERREM) IV  500 mg Intravenous Q24H  . polyethylene glycol  17 g Per Tube Daily  . sodium bicarbonate  1,300 mg Per Tube TID   Continuous Infusions: . feeding supplement (VITAL AF 1.2 CAL) 1,000 mL (04/14/14 1738)   PRN Meds:.acetaminophen (TYLENOL) oral liquid 160 mg/5 mL, levalbuterol, sodium chloride Assessment/Plan: 80 y.o woman PMH HTN, HLD, DM, CKD, chronic diastolic CHF, O67/E72 osteomyelitis (since 11/2013), hyperparathyroidism.  She presented 5/16 with acute encephalopathy found to have non convulsive status epilepticus seizures and acute pancreatitis (now improving).  Encephalopathy likely 2/2 Cefepime.  Seizures have since resolved and encephalopathy slowly improving though patient still not at baseline function.    # Acute encephalopathy -thought initially 2/2 Cefepime then 2/2 Depakene.  Mental status has slowly improved since stopping Depakene a few days ago but not at baseline -Neuro still following   #Osteomyelitis of T10/T11 -On Dapto/Meropenem. Rec tx x 6 weeks   -ID following and available for questions  -Monitor CK levels    #Acute on chronic renal failure -initially thought 2/2 decreased po, n/v prior to admission associated with antibiotics.  Renal function has not yet recovered this admission cr 5.95 today with i/o 800 cc in 24 hours -monitoring RFP -renal following  -will hold Lasix today per renal recs  #Likely acute on chronic diastolic heart failure -Pulmonary vascular congestion and volume overload noted likely 2/2 IVF initially treated for pancreatitis with h/o chronic diastolic heart failure.  -Now IVF off. -holding Lasix today to watch for renal recover -will need to reassess need for lasix in the future   #Anemia  -likely of chronic disease though DAT IgG + this admission and fobt neg. H/o 2 units this admission  -Hbg stable 8.2 -trend  CBC  #HTN -Coreg 25 mg bid, Hydralazine 25 mg tid  -continue to monitor   #Protein-calorie malnutrition, severe -Continue TF  #F/E/N -NSL -monitor electrolytes  -tube feeds. Repeat swallow eval today to f/u   #DVT px  -heparin   Of note updated daughter Juliann Pulse at bedside if she is not at bedside she would like to be updated daily [phone]  Dispo: unknown about d/c but will need to go back to [facility]   The patient does have a current PCP at [facility] and does not need an [facility] hospital follow-up appointment after discharge.  The patient does not have transportation limitations that hinder transportation to clinic appointments.  .Services Needed at time of discharge: Y = Yes, Blank = No PT: SNF  OT: SNF  RN:   Equipment:   Other:     LOS: 14 days   Cresenciano Genre, MD [phone] 04/17/2014, 7:29 AM

## 2014-04-17 NOTE — Progress Notes (Addendum)
ANTIBIOTIC CONSULT NOTE - Follow-up  Pharmacy Consult for Daptomycin and Merrem Indication: T10/T11 Osteomyelitis  Allergies  Allergen Reactions  . Cefepime     Seizures, neurotoxicity  . Motrin [Ibuprofen] Hives   Patient Measurements: Height: 5\' 2"  (157.5 cm) Weight: 210 lb 11.2 oz (95.573 kg) IBW/kg (Calculated) : 50.1 Vital Signs: Temp: 98.1 F (36.7 C) (05/30 0420) Temp src: Oral (05/30 0420) BP: 158/63 mmHg (05/30 0420) Pulse Rate: 88 (05/30 0420) Intake/Output from previous day: 05/29 0701 - 05/30 0700 In: 0  Out: 800 [Urine:800] Intake/Output from this shift:    Labs:  Recent Labs  04/15/14 0610  04/15/14 1630 04/16/14 0450 04/17/14 0515  WBC 9.3  --   --  11.4* 11.8*  HGB 8.7*  --   --  8.9* 8.2*  PLT 182  --   --  201 197  CREATININE  --   < > 5.95* 5.90* 5.95*  < > = values in this interval not displayed. Estimated Creatinine Clearance: 8 ml/min (by C-G formula based on Cr of 5.95). No results found for this basename: Letta Median, VANCORANDOM, GENTTROUGH, GENTPEAK, GENTRANDOM, TOBRATROUGH, TOBRAPEAK, TOBRARND, AMIKACINPEAK, AMIKACINTROU, AMIKACIN,  in the last 72 hours   Microbiology: 5/17 MRSA PCR -negative 5/16 Urine - insignificant growth 5/16 Blood - negative 5/23 Urine - >100K yeast  Medications:  Anti-infectives   Start     Dose/Rate Route Frequency Ordered Stop   04/15/14 1700  DAPTOmycin (CUBICIN) 500 mg in sodium chloride 0.9 % IVPB     500 mg 220 mL/hr over 30 Minutes Intravenous Every 48 hours 04/13/14 1639     04/15/14 1515  DAPTOmycin (CUBICIN) 500 mg in sodium chloride 0.9 % IVPB  Status:  Discontinued     500 mg 220 mL/hr over 30 Minutes Intravenous Every 48 hours 04/13/14 1516 04/13/14 1639   04/14/14 1600  meropenem (MERREM) 500 mg in sodium chloride 0.9 % 50 mL IVPB     500 mg 100 mL/hr over 30 Minutes Intravenous Every 24 hours 04/14/14 1545     04/13/14 1515  DAPTOmycin (CUBICIN) 500 mg in sodium chloride 0.9 %  IVPB  Status:  Discontinued     500 mg 220 mL/hr over 30 Minutes Intravenous Every 24 hours 04/13/14 1514 04/13/14 1516   04/11/14 1000  fluconazole (DIFLUCAN) 40 MG/ML suspension 100 mg     100 mg Oral Daily 04/11/14 0917 04/17/14 0837   04/11/14 1000  ciprofloxacin (CIPRO) 500 MG/5ML (10%) suspension 500 mg  Status:  Discontinued     500 mg Oral Daily 04/11/14 0917 04/12/14 1624   04/04/14 1000  ceFEPIme (MAXIPIME) 1 g in dextrose 5 % 50 mL IVPB  Status:  Discontinued     1 g 100 mL/hr over 30 Minutes Intravenous Every 24 hours 04/18/2014 1821 04/04/14 0931   04/04/14 1000  DAPTOmycin (CUBICIN) 530.5 mg in sodium chloride 0.9 % IVPB  Status:  Discontinued     6 mg/kg  88.4 kg 221.2 mL/hr over 30 Minutes Intravenous Every 24 hours 04/12/2014 1821 04/04/14 0138   03/26/2014 1230  ciprofloxacin (CIPRO) IVPB 400 mg     400 mg 200 mL/hr over 60 Minutes Intravenous  Once 04/12/2014 1225 03/19/2014 1423   04/09/2014 1200  cefTRIAXone (ROCEPHIN) 1 g in dextrose 5 % 50 mL IVPB  Status:  Discontinued     1 g 100 mL/hr over 30 Minutes Intravenous  Once 03/19/2014 1147 04/17/2014 1147   03/24/2014 1200  ceFEPIme (MAXIPIME) 2 g in dextrose 5 %  50 mL IVPB  Status:  Discontinued     2 g 100 mL/hr over 30 Minutes Intravenous Every 12 hours 04/09/2014 1148 04/13/2014 1225     Assessment: 83 YOF from SNF who had been treated as outpatient (Daptomycin and Cefepime 5/4-5/16) by Dr. Tommy Medal for osteomyelitis/discitis. Antibiotics stopped 5/16 due to elevated CK and seizures. Daptomycin restarted 5/26 and Merrem added 5/27 (Day #4 antibiotic restart). Noted plan for 6 weeks of antibiotics. ID following.  S/p Fluconazole x 7 days for yeast in urine. WBC slightly elevated. Afeb. 5/30 CK 54 (wnl).  Renal: SCr 5.95, est CrCl <10. Lytes ok. Appears that AKI/SCr have plateaued. Not a candidate for HD/CRRT per renal. Renal following.  Goal of Therapy:  Clinical resolution of infection  Plan:  Continue Dapto 500mg  IV q48h.  Weekly  CK - next 6/5 (will change to Fri so doesn't fall on weekend). Continue Merrem 500mg  IV q24h.   Sherlon Handing, PharmD, BCPS Clinical pharmacist, pager [phone] 04/17/2014,1:05 PM

## 2014-04-17 NOTE — Evaluation (Signed)
Clinical/Bedside Swallow Evaluation Patient Details  Name: [name] [name] MRN: [id number] Date of Birth: 12/25/1929  Today's Date: 04/17/2014 Time: 1027-2536 SLP Time Calculation (min): 20 min  Past Medical History:  Past Medical History  Diagnosis Date  . Hypertension   . Gout   . Hypercholesteremia   . Diabetes mellitus   . CHF (congestive heart failure)   . Renal disorder   . Cancer   . Edema   . Chronic diastolic CHF (congestive heart failure), NYHA class 2 12/27/2013  . Hypertensive heart disease 12/28/2013  . Pleural effusion on right 12/13/2013  . HCAP (healthcare-associated pneumonia) 12/22/2013  . Osteomyelitis of thoracic spine 11/29/2013  . Discitis of thoracic region 11/29/2013  . CKD (chronic kidney disease) stage 4, GFR 15-29 ml/min 11/20/2013  . Hyperparathyroidism 01/10/2014  . Generalized weakness 11/30/2013   Past Surgical History:  Past Surgical History  Procedure Laterality Date  . Cesarean section    . Rotator cuff repair    . Tonsillectomy    . Tubal ligation     HPI:  80 yo female with PMH of HTN, DM, pancreatitis, CKD4, vertebral osteomyelitis on IV abx ( recently changed to Cefepime ) admitted 5/16 with AMS. Diagnosed with acute encephalopathy in setting of non-convulsive status epilepticus and metabolic toxicity (thought to be due to cefepime neuro toxicity). Also with possible bibasilar PNA.     Assessment / Plan / Recommendation Clinical Impression  Bedside swallow evaluation complete. AMS is largest contributing factor to inability to safely consume pos at this time. Vocal quality wet at baseline with intermittent throat clearing in attempts to clear what this SLP suspects to be secretions on top (and possibly below) vocal cords. SLP provided verbal cueing for increased strength of cough and dry swallow which successfully cleared vocal quality, hopefully indicative of increased airway protection. Oral care provided however patient not alert enough for pos  today. Will continue to f/u for diagnostic treatment at bedside. Daughter present and supportive. Education complete with daugther regarding importance of oral care and cueing for throat clear/cough with presentation of wet vocal quality, both for decreased risk of aspiration or an aspiration related infection.     Aspiration Risk  Severe    Diet Recommendation NPO;Alternative means - temporary   Medication Administration: Via alternative means    Other  Recommendations Oral Care Recommendations:  (QID)   Follow Up Recommendations   (TBD)    Frequency and Duration min 3x week  1 week   Pertinent Vitals/Pain n/a    Swallow Study    General HPI: 80 yo female with PMH of HTN, DM, pancreatitis, CKD4, vertebral osteomyelitis on IV abx ( recently changed to Cefepime ) admitted 5/16 with AMS. Diagnosed with acute encephalopathy in setting of non-convulsive status epilepticus and metabolic toxicity (thought to be due to cefepime neuro toxicity). Also with possible bibasilar PNA.   Type of Study: Bedside swallow evaluation Previous Swallow Assessment: none Diet Prior to this Study: NPO;Panda Temperature Spikes Noted: No Respiratory Status: Room air History of Recent Intubation: No Behavior/Cognition: Lethargic;Requires cueing Oral Cavity - Dentition: Edentulous Self-Feeding Abilities: Total assist Patient Positioning: Upright in bed Baseline Vocal Quality: Low vocal intensity;Wet Volitional Cough: Cognitively unable to elicit Volitional Swallow: Unable to elicit    Oral/Motor/Sensory Function Overall Oral Motor/Sensory Function: Impaired Labial ROM: Reduced right;Reduced left (right greater than left) Labial Symmetry: Abnormal symmetry right (although leaning to the right which may be impacting) Labial Strength: Reduced Lingual ROM:  (unable to assess due to  AMS)   Ice Chips Ice chips: Not tested   Thin Liquid Thin Liquid: Not tested    Nectar Thick Nectar Thick Liquid: Not tested    Honey Thick Honey Thick Liquid: Not tested   Puree Puree: Not tested   Solid   GO   Kelechi Astarita MA, CCC-SLP [phone]  Solid: Not tested       Jaeceon Michelin Meryl Michiah Mudry 04/17/2014,9:07 AM

## 2014-04-18 LAB — CBC WITH DIFFERENTIAL/PLATELET
Basophils Absolute: 0.2 10*3/uL — ABNORMAL HIGH (ref 0.0–0.1)
Basophils Relative: 1 % (ref 0–1)
EOS PCT: 1 % (ref 0–5)
Eosinophils Absolute: 0.2 10*3/uL (ref 0.0–0.7)
HCT: 23.1 % — ABNORMAL LOW (ref 36.0–46.0)
HEMOGLOBIN: 7.9 g/dL — AB (ref 12.0–15.0)
Lymphocytes Relative: 12 % (ref 12–46)
Lymphs Abs: 1.8 10*3/uL (ref 0.7–4.0)
MCH: 28.9 pg (ref 26.0–34.0)
MCHC: 34.2 g/dL (ref 30.0–36.0)
MCV: 84.6 fL (ref 78.0–100.0)
MONO ABS: 2.3 10*3/uL — AB (ref 0.1–1.0)
Monocytes Relative: 15 % — ABNORMAL HIGH (ref 3–12)
NEUTROS PCT: 71 % (ref 43–77)
Neutro Abs: 10.5 10*3/uL — ABNORMAL HIGH (ref 1.7–7.7)
Platelets: 203 10*3/uL (ref 150–400)
RBC: 2.73 MIL/uL — AB (ref 3.87–5.11)
RDW: 16.5 % — ABNORMAL HIGH (ref 11.5–15.5)
WBC MORPHOLOGY: INCREASED
WBC: 15 10*3/uL — AB (ref 4.0–10.5)

## 2014-04-18 LAB — RENAL FUNCTION PANEL
ALBUMIN: 1.5 g/dL — AB (ref 3.5–5.2)
BUN: 167 mg/dL — ABNORMAL HIGH (ref 6–23)
CHLORIDE: 106 meq/L (ref 96–112)
CO2: 18 meq/L — AB (ref 19–32)
CREATININE: 5.94 mg/dL — AB (ref 0.50–1.10)
Calcium: 8.9 mg/dL (ref 8.4–10.5)
GFR calc Af Amer: 7 mL/min — ABNORMAL LOW (ref >90)
GFR, EST NON AFRICAN AMERICAN: 6 mL/min — AB (ref >90)
Glucose, Bld: 198 mg/dL — ABNORMAL HIGH (ref 70–99)
POTASSIUM: 4.7 meq/L (ref 3.7–5.3)
Phosphorus: 3.4 mg/dL (ref 2.3–4.6)
Sodium: 146 mEq/L (ref 137–147)

## 2014-04-18 LAB — GLUCOSE, CAPILLARY
Glucose-Capillary: 197 mg/dL — ABNORMAL HIGH (ref 70–99)
Glucose-Capillary: 202 mg/dL — ABNORMAL HIGH (ref 70–99)
Glucose-Capillary: 175 mg/dL — ABNORMAL HIGH (ref 70–99)
Glucose-Capillary: 215 mg/dL — ABNORMAL HIGH (ref 70–99)
Glucose-Capillary: 216 mg/dL — ABNORMAL HIGH (ref 70–99)

## 2014-04-18 LAB — CLOSTRIDIUM DIFFICILE BY PCR: Toxigenic C. Difficile by PCR: NEGATIVE

## 2014-04-18 MED ORDER — HYDROCORTISONE 1 % EX CREA
TOPICAL_CREAM | Freq: Three times a day (TID) | CUTANEOUS | Status: DC
  Administered 2014-04-19: 1 via TOPICAL
  Filled 2014-04-18 (×2): qty 28

## 2014-04-18 MED ORDER — LOPERAMIDE HCL 1 MG/5ML PO LIQD
2.0000 mg | ORAL | Status: DC | PRN
  Administered 2014-04-18: 2 mg
  Filled 2014-04-18: qty 10

## 2014-04-18 MED ORDER — BARRIER CREAM NON-SPECIFIED
1.0000 "application " | TOPICAL_CREAM | Freq: Two times a day (BID) | TOPICAL | Status: DC | PRN
  Filled 2014-04-18: qty 1

## 2014-04-18 NOTE — Progress Notes (Signed)
SLP Cancellation Note  Patient Details Name: [name] [name] MRN: [id number] DOB: [birth year]   Cancelled treatment:       Reason Eval/Treat Not Completed: Fatigue/lethargy limiting ability to participate  Gabriel Rainwater [place], [place] [phone]  HGDJ Meryl Amiee Wiley 04/18/2014, 9:52 AM

## 2014-04-18 NOTE — Progress Notes (Addendum)
Subjective: Pt aware that someone is in the room, moaning, non verbal with no specific complaints, just had a BM. No family at bedside.   Objective: Vital signs in last 24 hours: Filed Vitals:   04/17/14 1423 04/17/14 2118 04/18/14 0447 04/18/14 0618  BP: 170/84 158/69 146/47 156/49  Pulse: 85 92 86 85  Temp: 98.6 F (37 C) 99.9 F (37.7 C) 98.7 F (37.1 C)   TempSrc: Oral Axillary Axillary   Resp: 18 18 18    Height:      Weight:   211 lb 6.7 oz (95.9 kg)   SpO2: 97% 98% 95%    Weight change: 11.5 oz (0.327 kg)  Intake/Output Summary (Last 24 hours) at 04/18/14 1233 Last data filed at 04/18/14 0644  Gross per 24 hour  Intake    150 ml  Output   1401 ml  Net  -1251 ml   Vitals reviewed.  General: resting in bed, nad, moaning, and saying few simple words  HEENT: [place]/at, no scleral icterus  Cardiac: RRR, no rubs, murmurs or gallops  Pulm:no wheezes, no respiratory distress Abd: soft, nontender with deep palpation, non distended Ext: warm and well perfused, 1 to 2+ pedal edema, 1+ edema b/l upper ext  GU: External rectum examined with posterior small area of skin breakdown with some oozing of blood.  Neuro: eyes closed, moaning and saying few simple words intermittently, following commands intermittently  Lab Results: Basic Metabolic Panel:  Recent Labs Lab 04/15/14 0610  04/17/14 0515 04/18/14 0531  NA  --   < > 147 146  K  --   < > 4.8 4.7  CL  --   < > 106 106  CO2  --   < > 18* 18*  GLUCOSE  --   < > 190* 198*  BUN  --   < > 156* 167*  CREATININE  --   < > 5.95* 5.94*  CALCIUM  --   < > 9.5 8.9  MG 2.6*  --   --   --   PHOS  --   < > 3.2 3.4  < > = values in this interval not displayed. Liver Function Tests:  Recent Labs Lab 04/14/14 0500  04/17/14 0515 04/18/14 0531  AST 39*  --   --   --   ALT 24  --   --   --   ALKPHOS 68  --   --   --   BILITOT <0.2*  --   --   --   PROT 6.3  --   --   --   ALBUMIN 1.5*  1.5*  < > 1.4* 1.5*  < > = values  in this interval not displayed.  Recent Labs Lab 04/12/14 0430  LIPASE 89*    Recent Labs Lab 04/12/14 0900  AMMONIA 25   CBC:  Recent Labs Lab 04/17/14 0515 04/18/14 0531  WBC 11.8* 15.0*  NEUTROABS 8.5* 10.5*  HGB 8.2* 7.9*  HCT 24.3* 23.1*  MCV 84.7 84.6  PLT 197 203   Cardiac Enzymes:  Recent Labs Lab 04/17/14 0515  CKTOTAL 54   BNP:  Recent Labs Lab 04/15/14 0610  PROBNP 13216.0*   CBG:  Recent Labs Lab 04/17/14 1603 04/17/14 2020 04/17/14 2353 04/18/14 0446 04/18/14 0857 04/18/14 1157  GLUCAP 201* 183* 180* 197* 202* 175*   Coagulation:  Recent Labs Lab 04/12/14 0430  LABPROT 15.3*  INR 1.24   Anemia Panel:  Recent Labs Lab 04/12/14 0430  RETICCTPCT 1.3    Micro Results: Recent Results (from the past 240 hour(s))  URINE CULTURE     Status: None   Collection Time    04/10/14  5:17 PM      Result Value Ref Range Status   Specimen Description URINE, CATHETERIZED   Final   Special Requests NONE   Final   Culture  Setup Time     Final   Value: 04/11/2014 03:48     Performed at [facility]     Final   Value: >=100,000 COLONIES/ML     Performed at [facility]   Culture     Final   Value: YEAST     Performed at [facility]   Report Status 04/12/2014 FINAL   Final  CLOSTRIDIUM DIFFICILE BY PCR     Status: None   Collection Time    04/17/14  8:45 PM      Result Value Ref Range Status   C difficile by pcr NEGATIVE  NEGATIVE Final   Studies/Results: Dg Abd 1 View  04/16/2014   CLINICAL DATA:  Feeding catheter placement  EXAM: ABDOMEN - 1 VIEW  COMPARISON:  None.  FINDINGS: Feeding catheter was placed without difficulty into the stomach and advanced under fluoroscopic guidance into the proximal jejunum. 3 min of 51 seconds of fluoroscopy was utilized. 40 mL Omnipaque 350 was utilized to confirm catheter position.  IMPRESSION: Successful placement of a feeding catheter into the proximal  jejunum.   Electronically Signed   By: Inez Catalina M.D.   On: 04/16/2014 15:55   Dg Addison Bailey G Tube Plc W/fl-no Rad  04/16/2014   CLINICAL DATA: replace panda ng tube.   NASO G TUBE PLACEMENT WITH FLUORO  Fluoroscopy was utilized by the requesting physician.  No radiographic  interpretation.    Medications: I have reviewed the patient's current medications. Scheduled Meds: . antiseptic oral rinse  15 mL Mouth Rinse q12n4p  . carvedilol  25 mg Per Tube BID WC  . chlorhexidine  15 mL Mouth Rinse BID  . cloNIDine  0.1 mg Transdermal Weekly  . DAPTOmycin (CUBICIN)  IV  500 mg Intravenous Q48H  . darbepoetin (ARANESP) injection - NON-DIALYSIS  100 mcg Subcutaneous Q Tue-1800  . heparin subcutaneous  5,000 Units Subcutaneous 3 times per day  . hydrALAZINE  25 mg Oral 3 times per day  . hydrocortisone cream   Topical TID  . insulin aspart  0-15 Units Subcutaneous 6 times per day  . meropenem (MERREM) IV  500 mg Intravenous Q24H  . polyethylene glycol  17 g Per Tube Daily  . sodium bicarbonate  1,300 mg Per Tube TID   Continuous Infusions: . feeding supplement (VITAL AF 1.2 CAL) 1,000 mL (04/18/14 0850)   PRN Meds:.acetaminophen (TYLENOL) oral liquid 160 mg/5 mL, levalbuterol, loperamide, sodium chloride Assessment/Plan: 80 y.o woman PMH HTN, HLD, DM, CKD, chronic diastolic CHF, P61/P50 osteomyelitis (since 11/2013), hyperparathyroidism. She presented 5/16 with acute encephalopathy found to have non convulsive status epilepticus seizures and acute pancreatitis (now improving). Encephalopathy likely 2/2 Cefepime. Seizures have since resolved and encephalopathy slowly improving though patient still not at baseline function.   # Acute encephalopathy  -thought initially 2/2 Cefepime then 2/2 Depakene. Mental status has slowly improved since stopping Depakene but not at baseline  -Neuro still following   #Osteomyelitis of T10/T11  -On Dapto/Meropenem. Rec tx x 6 weeks  -ID following and available  for questions  -Monitor CK  levels   #Diarrhea. Per nursing, pt has had diarrhea since tube feeds were started. C. diff negative. Rectal area with skin break down, oozing small amount of blood.  -Barrier cream -Preparation H PRN -Imodium 2mg  liq per tube PRN (up to 16mg  per day)  #Acute on chronic renal failure  -initially thought 2/2 decreased po, n/v prior to admission associated with antibiotics. Renal function has not yet recovered this admission cr 5.94 today but with adequate UOP of 1453ml in 24 hours  -monitoring RFP  -renal following (though has commented that pt would not be a good candidate for HD at this time) -will continue holding Lasix today per renal recs   #Likely acute on chronic diastolic heart failure  -Pulmonary vascular congestion and volume overload noted likely 2/2 IVF initially treated for pancreatitis with h/o chronic diastolic heart failure.  -Now IVF off.  -holding Lasix today to watch for renal recover  -will need to reassess need for lasix in the future   #Anemia  -likely of chronic disease though DAT IgG + this admission and fobt neg. H/o 2 units this admission  -Hbg stable 8.2  -trend CBC   #HTN  -Coreg 25 mg bid, Hydralazine 25 mg tid, mildly elevated this morning.  -continue to monitor   #Protein-calorie malnutrition, severe  -Continue TF   #F/E/N  -NSL  -monitor electrolytes  -tube feeds. Repeat swallow eval once mental status improved   #DVT px  -heparin   Of note updated daughter Juliann Pulse with brief [place] discussion over the phone (she requested this to be done today over the phone) and she will consider Palliative Medicine Consult for further [facility] discussion while maximizing her mom's comfort. I explained to the daughter that the pt's renal function is tenuous and Nephrology has found HD not to be a good option for her at this time but we will continue trending her SCr and UOP. There is no advance directive that Juliann Pulse is aware of, she will discuss  her mom's status with her brother who lives in [place]. She would like a phone call on 6/1 from the team to further discuss her mom's status and will let us know if she wants to proceed with a Palliative Care consult.   The daughter would like to be updated daily [phone].    Dispo: Disposition is deferred at this time, awaiting improvement of current medical problems.   The patient does not have a current PCP (No Pcp Per Patient) and does not need an [facility] hospital follow-up appointment after discharge.  The patient does not have transportation limitations that hinder transportation to clinic appointments.  .Services Needed at time of discharge: Y = Yes, Blank = No PT:   OT:   RN:   Equipment:   Other:     LOS: 15 days   Blain Pais, MD 04/18/2014, 12:33 PM

## 2014-04-19 ENCOUNTER — Inpatient Hospital Stay (HOSPITAL_COMMUNITY): Payer: Medicare HMO

## 2014-04-19 LAB — COMPREHENSIVE METABOLIC PANEL
ALBUMIN: 1.4 g/dL — AB (ref 3.5–5.2)
ALT: 46 U/L — ABNORMAL HIGH (ref 0–35)
AST: 100 U/L — AB (ref 0–37)
Alkaline Phosphatase: 77 U/L (ref 39–117)
BUN: 178 mg/dL — ABNORMAL HIGH (ref 6–23)
CO2: 19 mEq/L (ref 19–32)
CREATININE: 5.74 mg/dL — AB (ref 0.50–1.10)
Calcium: 9.3 mg/dL (ref 8.4–10.5)
Chloride: 109 mEq/L (ref 96–112)
GFR calc Af Amer: 7 mL/min — ABNORMAL LOW (ref >90)
GFR, EST NON AFRICAN AMERICAN: 6 mL/min — AB (ref >90)
Glucose, Bld: 197 mg/dL — ABNORMAL HIGH (ref 70–99)
Potassium: 5.1 mEq/L (ref 3.7–5.3)
Sodium: 150 mEq/L — ABNORMAL HIGH (ref 137–147)
Total Bilirubin: <0.2 mg/dL — ABNORMAL LOW (ref 0.3–1.2)
Total Protein: 5.7 g/dL — ABNORMAL LOW (ref 6.0–8.3)

## 2014-04-19 LAB — CBC WITH DIFFERENTIAL/PLATELET
BASOS PCT: 0 % (ref 0–1)
Basophils Absolute: 0.1 10*3/uL (ref 0.0–0.1)
Basophils Absolute: 0 10*3/uL (ref 0.0–0.1)
Basophils Relative: 0 % (ref 0–1)
EOS ABS: 0.2 10*3/uL (ref 0.0–0.7)
Eosinophils Absolute: 0.1 10*3/uL (ref 0.0–0.7)
Eosinophils Relative: 1 % (ref 0–5)
Eosinophils Relative: 1 % (ref 0–5)
HCT: 21.3 % — ABNORMAL LOW (ref 36.0–46.0)
HCT: 20.6 % — ABNORMAL LOW (ref 36.0–46.0)
HEMOGLOBIN: 7.2 g/dL — AB (ref 12.0–15.0)
Hemoglobin: 6.8 g/dL — CL (ref 12.0–15.0)
LYMPHS ABS: 1.7 10*3/uL (ref 0.7–4.0)
LYMPHS PCT: 11 % — AB (ref 12–46)
Lymphocytes Relative: 13 % (ref 12–46)
Lymphs Abs: 1.9 10*3/uL (ref 0.7–4.0)
MCH: 28.8 pg (ref 26.0–34.0)
MCH: 28.7 pg (ref 26.0–34.0)
MCHC: 33.8 g/dL (ref 30.0–36.0)
MCHC: 33 g/dL (ref 30.0–36.0)
MCV: 85.2 fL (ref 78.0–100.0)
MCV: 86.9 fL (ref 78.0–100.0)
MONO ABS: 2.2 10*3/uL — AB (ref 0.1–1.0)
MONOS PCT: 15 % — AB (ref 3–12)
MONOS PCT: 10 % (ref 3–12)
Monocytes Absolute: 1.5 10*3/uL — ABNORMAL HIGH (ref 0.1–1.0)
NEUTROS ABS: 10.2 10*3/uL — AB (ref 1.7–7.7)
NEUTROS PCT: 78 % — AB (ref 43–77)
Neutro Abs: 12 10*3/uL — ABNORMAL HIGH (ref 1.7–7.7)
Neutrophils Relative %: 71 % (ref 43–77)
PLATELETS: 167 10*3/uL (ref 150–400)
Platelets: 198 10*3/uL (ref 150–400)
RBC: 2.5 MIL/uL — ABNORMAL LOW (ref 3.87–5.11)
RBC: 2.37 MIL/uL — AB (ref 3.87–5.11)
RDW: 16.7 % — ABNORMAL HIGH (ref 11.5–15.5)
RDW: 17 % — ABNORMAL HIGH (ref 11.5–15.5)
WBC: 14.6 10*3/uL — ABNORMAL HIGH (ref 4.0–10.5)
WBC: 15.4 10*3/uL — AB (ref 4.0–10.5)

## 2014-04-19 LAB — BLOOD GAS, ARTERIAL
ACID-BASE DEFICIT: 7.2 mmol/L — AB (ref 0.0–2.0)
Bicarbonate: 17.9 mEq/L — ABNORMAL LOW (ref 20.0–24.0)
Drawn by: 313941
FIO2: 1 %
O2 SAT: 99.6 %
PATIENT TEMPERATURE: 98.6
PEEP/CPAP: 5 cmH2O
PO2 ART: 355 mmHg — AB (ref 80.0–100.0)
RATE: 20 resp/min
TCO2: 19.1 mmol/L (ref 0–100)
VT: 400 mL
pCO2 arterial: 37 mmHg (ref 35.0–45.0)
pH, Arterial: 7.306 — ABNORMAL LOW (ref 7.350–7.450)

## 2014-04-19 LAB — RENAL FUNCTION PANEL
Albumin: 1.5 g/dL — ABNORMAL LOW (ref 3.5–5.2)
BUN: 171 mg/dL — ABNORMAL HIGH (ref 6–23)
CALCIUM: 9.3 mg/dL (ref 8.4–10.5)
CO2: 18 meq/L — AB (ref 19–32)
CREATININE: 5.7 mg/dL — AB (ref 0.50–1.10)
Chloride: 110 mEq/L (ref 96–112)
GFR calc Af Amer: 7 mL/min — ABNORMAL LOW (ref >90)
GFR calc non Af Amer: 6 mL/min — ABNORMAL LOW (ref >90)
GLUCOSE: 186 mg/dL — AB (ref 70–99)
Phosphorus: 4.4 mg/dL (ref 2.3–4.6)
Potassium: 4.9 mEq/L (ref 3.7–5.3)
Sodium: 150 mEq/L — ABNORMAL HIGH (ref 137–147)

## 2014-04-19 LAB — GLUCOSE, CAPILLARY
GLUCOSE-CAPILLARY: 166 mg/dL — AB (ref 70–99)
GLUCOSE-CAPILLARY: 154 mg/dL — AB (ref 70–99)
GLUCOSE-CAPILLARY: 148 mg/dL — AB (ref 70–99)
GLUCOSE-CAPILLARY: 125 mg/dL — AB (ref 70–99)
Glucose-Capillary: 198 mg/dL — ABNORMAL HIGH (ref 70–99)
Glucose-Capillary: 160 mg/dL — ABNORMAL HIGH (ref 70–99)
Glucose-Capillary: 169 mg/dL — ABNORMAL HIGH (ref 70–99)

## 2014-04-19 LAB — LACTIC ACID, PLASMA: Lactic Acid, Venous: 1 mmol/L (ref 0.5–2.2)

## 2014-04-19 LAB — TROPONIN I: Troponin I: <0.3 ng/mL (ref <0.30)

## 2014-04-19 MED ORDER — PANTOPRAZOLE SODIUM 40 MG IV SOLR
40.0000 mg | INTRAVENOUS | Status: DC
  Administered 2014-04-19: 40 mg via INTRAVENOUS
  Filled 2014-04-19 (×2): qty 40

## 2014-04-19 MED ORDER — SODIUM CHLORIDE 0.9 % IV SOLN
INTRAVENOUS | Status: DC
  Administered 2014-04-20 – 2014-04-21 (×2): via INTRAVENOUS

## 2014-04-19 MED ORDER — IPRATROPIUM-ALBUTEROL 0.5-2.5 (3) MG/3ML IN SOLN
3.0000 mL | Freq: Four times a day (QID) | RESPIRATORY_TRACT | Status: DC
  Administered 2014-04-19 – 2014-04-21 (×5): 3 mL via RESPIRATORY_TRACT
  Filled 2014-04-19 (×5): qty 3

## 2014-04-19 NOTE — Progress Notes (Signed)
CRITICAL VALUE ALERT  Critical value received:  HGB=6.8  Date of notification:  04/19/14  Time of notification:  9163  Critical value read back:yes  Nurse who received alert:  Cindra Presume, RN  MD notified (1st page):  Dr. Titus Mould  Time of first page:  Notified at bedside  MD notified (2nd page):  Time of second page:  Responding MD:    Time MD responded:

## 2014-04-19 NOTE — Progress Notes (Signed)
Physical Therapy Discharge Patient Details Name: [name] [name] MRN: [id number] DOB: 08-07-1930 Today's Date: 04/19/2014 Time:  -     Patient discharged from PT services secondary to medical decline - will need to re-order PT to resume therapy services.  Please see latest therapy progress note for current level of functioning and progress toward goals.    Progress and discharge plan discussed with patient and/or caregiver: Patient unable to participate in discharge planning and no caregivers available  GP     Shary Decamp [facility] 04/19/2014, 9:19 AM

## 2014-04-19 NOTE — Code Documentation (Signed)
Code blue called at [place].  See code blue sheet.  ROSC at 0854.  Patient transported to 2 H 11 via bed with zoll and 100% oxygen via ambu bag.

## 2014-04-19 NOTE — Progress Notes (Signed)
Patient transferred to [facility]. CSW provided handoff to unit social worker. This CSW signing off.  [name], MSW, [place]

## 2014-04-19 NOTE — Progress Notes (Signed)
Chaplain responded to code blue. No family present. Chaplain available if family arrives and needs support.

## 2014-04-19 NOTE — Progress Notes (Signed)
PULMONARY / CRITICAL CARE MEDICINE   Name: [name] [name] MRN: [id number] DOB: 11/03/1930    ADMISSION DATE:  03/22/2014 CONSULTATION DATE:  04/05/14  PRIMARY SERVICE: IMTS  CHIEF COMPLAINT:  AMS  BRIEF PATIENT DESCRIPTION: 80yo female with hx CKD4, HTN, DM, pancreatitis, verterbal osteomyelitis on IV cefepime.  Admitted 5/16 with AMS, continuous EEG revealed status epilepticus thought r/t cefepime and PCCM originally consulted for tx ICU and potential need for ETT/propofol.  This was not needed, pt remained fairly stable (although no improvement AMS), status epilepticus resolved and pt tx to floor, PCCM signed off.  Continued to have ongoing issues with acute on chronic renal failure, AMS.  Goals of care discussion planned for 6/1 given prolonged AMS and poor prognosis. Developed significant diarrhea (CDiff neg) 5/30, with subsequent hypernatremia.  On 6/1 had bradycardic arrest, ~10 mins CPR, intubated, tx ICU and PCCM called back.   SIGNIFICANT EVENTS / STUDIES:  5/27 MRI brain>>> Stable and essentially normal for age non contrast MRI appearance of the brain.  LINES / TUBES: RUA PICC (pta)>>>  CULTURES: CDiff PCR 5/23>>>neg Urine 5/16>>> BC x2 5/16>>>neg   ANTIBIOTICS: Daptomycin 5/16>>> Merropenem 5/16>>>   SUBJECTIVE/ INTERVAL Hx:  See HPI.  S/p cardiac arrest.    VITAL SIGNS: Temp:  [98.6 F (37 C)-99.8 F (37.7 C)] 99.8 F (37.7 C) (06/01 0518) Pulse Rate:  [75-103] 75 (06/01 0945) Resp:  [19-24] 24 (06/01 0945) BP: (90-149)/(46-70) 90/46 mmHg (06/01 0945) SpO2:  [94 %-100 %] 100 % (06/01 0945) FiO2 (%):  [100 %] 100 % (06/01 0910) Weight:  [209 lb 14.1 oz (95.2 kg)-218 lb 14.7 oz (99.3 kg)] 209 lb 14.1 oz (95.2 kg) (06/01 0945) HEMODYNAMICS:   VENTILATOR SETTINGS: Vent Mode:  [-] PRVC FiO2 (%):  [100 %] 100 % Set Rate:  [20 bmp] 20 bmp Vt Set:  [400 mL] 400 mL PEEP:  [5 cmH20] 5 cmH20 INTAKE / OUTPUT: Intake/Output     05/31 0701 - 06/01 0700 06/01 0701 -  06/02 0700   NG/GT     Total Intake(mL/kg)     Urine (mL/kg/hr) 850 (0.4)    Stool 2 (0)    Total Output 852     Net -852            PHYSICAL EXAMINATION: General:  Chronically ill appearing female, NAD post arrest  Neuro:  No response to pain, pupils pinpoint, +gag HEENT:  Mm moist, no JVD, ETT Cardiovascular:  s1s2 rrr Lungs:  resps even non labored on vent, scattered wheeze  Abdomen:  Soft, non distended, -bs, profuse diarrhea  Musculoskeletal:  Warm and dry, scant BLE edema   LABS:  CBC  Recent Labs Lab 04/17/14 0515 04/18/14 0531 04/19/14 0420  WBC 11.8* 15.0* 14.6*  HGB 8.2* 7.9* 7.2*  HCT 24.3* 23.1* 21.3*  PLT 197 203 198   Coag's No results found for this basename: APTT, INR,  in the last 168 hours BMET  Recent Labs Lab 04/17/14 0515 04/18/14 0531 04/19/14 0420  NA 147 146 150*  K 4.8 4.7 4.9  CL 106 106 110  CO2 18* 18* 18*  BUN 156* 167* 171*  CREATININE 5.95* 5.94* 5.70*  GLUCOSE 190* 198* 186*   Electrolytes  Recent Labs Lab 04/15/14 0610  04/17/14 0515 04/18/14 0531 04/19/14 0420  CALCIUM  --   < > 9.5 8.9 9.3  MG 2.6*  --   --   --   --   PHOS  --   < >  3.2 3.4 4.4  < > = values in this interval not displayed. Sepsis Markers  Recent Labs Lab 04/15/14 0610  PROCALCITON 0.67   ABG No results found for this basename: PHART, PCO2ART, PO2ART,  in the last 168 hours Liver Enzymes  Recent Labs Lab 04/14/14 0500  04/17/14 0515 04/18/14 0531 04/19/14 0420  AST 39*  --   --   --   --   ALT 24  --   --   --   --   ALKPHOS 68  --   --   --   --   BILITOT <0.2*  --   --   --   --   ALBUMIN 1.5*  1.5*  < > 1.4* 1.5* 1.5*  < > = values in this interval not displayed. Cardiac Enzymes  Recent Labs Lab 04/15/14 0610  PROBNP 13216.0*   Glucose  Recent Labs Lab 04/18/14 1157 04/18/14 1549 04/18/14 2005 04/19/14 0146 04/19/14 0509 04/19/14 0807  GLUCAP 175* 215* 216* 198* 166* 154*    Imaging No results  found.   CXR: pending   ASSESSMENT / PLAN:  PULMONARY Acute respiratory failure - post bradycardic arrest  Likely hyperkalemia contribution P:   Vent support - 8cc/kg  With increase in rate with K  bicarb Stat ABG Stat CXR Add BD   CARDIOVASCULAR bradycardic arrest - ?r/t hyperkalemia v respiratory arrest Acute on chronic dCHF HTN P:  Cont hold diuresis with acute on chronic renal failure  F/u CXR  Gentle volume with hypernatremia, large volume diarrhea , consider use saline still post arrest with risk brain edema F/u enzymes F/u EKG  Hold anti-HTN F/u lactate   RENAL Acute on CKD4 - ?r/t abx.  Baseline Scr ~2 At risk IN from ABX  Hypernatremia, now arrest at risk brain edema ?hyperkalemia P:   Poor dialysis candidate per renal  F/u chem  Hold diuresis  Gentle volume  Consider further urine studies   GASTROINTESTINAL Diarrhea - CDiff neg. ?etiology of acute onset diarrhea preceding arrest. ?combination TF and abx v underlying pathology such as ischemic bowel, perf, etc.  Hx pancreatitis  protein calorie malnutrition  P:   Stat KUB  Hold TF LFT  HEMATOLOGIC Anemia - presume r/t chronic disease. No obvious source bleeding but hgb trending down P:  Stat cbc  SQ heparin, sd's  Transfuse per ICU guidelines  Hemoccult stool   INFECTIOUS Osteomyelitis of T10/T11  P:   ID following  abx as above - rec tx x 6 weeks   ENDOCRINE DM   P:   SSI   NEUROLOGIC AMS - initially thought r/t seizures in setting cefepime, then thought ?r/t depakote. Minimal improvement.  Seizures  P:   RASS goal: 0 Poor prognosis overall given prolonged AMS, poor recovery Neuro following  Need to establish goals of care with family - awaiting daughter's arrival  Consider comfort care   Nickolas Madrid, NP 04/19/2014  10:08 AM Pager: [phone] or [phone]  Spoke with pts daughter and grandson, updated at length.  They understand that she has remained very ill  with little recovery of mental status and prognosis is very poor, especially with arrest this am.  They wish to make pt DNR and will likely want to discuss goals of care, comfort measure but wish to wait until they can discuss via phone with pts son who lives in [place].  Will f/u  I have personally obtained a history, examined the patient, evaluated laboratory and  imaging results, formulated the assessment and plan and placed orders. CRITICAL CARE: The patient is critically ill with multiple organ systems failure and requires high complexity decision making for assessment and support, frequent evaluation and titration of therapies, application of advanced monitoring technologies and extensive interpretation of multiple databases. Critical Care Time devoted to patient care services described in this note is 40 minutes.   Lavon Paganini. Titus Mould, MD, [place] Pgr: [facility]

## 2014-04-19 NOTE — Code Documentation (Signed)
  Patient Name: MARLAINA COBURN   MRN: [id number]   Date of Birth/ Sex: 04-Jun-1930 , female      Admission Date: 03/27/2014  Attending Provider: Resident responding- Randell Loop, MD.   Madilyn Fireman, MD   Primary Diagnosis: Seizures    Indication: Pt was in her usual state of health until this AM, when she was noted to be in respiratory arrest and bradycardia, asystole. Code blue was subsequently called. At the time of arrival on scene, ACLS protocol was underway.   Technical Description:  - CPR performance duration:   11 mins  - Was defibrillation or cardioversion used? No   - Was external pacer placed? No  - Was patient intubated pre/post CPR? Yes,    Medications Administered: Y = Yes; Blank = No Amiodarone    Atropine    Calcium    Epinephrine  yes  Lidocaine    Magnesium    Norepinephrine    Phenylephrine    Sodium bicarbonate  yes  Vasopressin     Post CPR evaluation:  - Final Status - Was patient successfully resuscitated ? Yes - What is current rhythm? Sinus Tach - What is current hemodynamic status? stable  Miscellaneous Information:  - Labs sent, including: No  - Primary team notified?  yes  - Family Notified? yes  - Additional notes/ transfer status: Transferred to ICU under the care of PCCM.     Jenetta Downer, MD PGY-1.  04/19/2014, 9:23 AM

## 2014-04-19 NOTE — Progress Notes (Signed)
Reported off at bedside to [facility], [place].

## 2014-04-19 NOTE — Procedures (Signed)
Intubation Procedure Note [name] [name] [id number] 1930-06-08  Procedure: Intubation Indications: Respiratory insufficiency  Procedure Details Consent: Unable to obtain consent because of emergent medical necessity. Time Out: Verified patient identification, verified procedure, site/side was marked, verified correct patient position, special equipment/implants available, medications/allergies/relevent history reviewed, required imaging and test results available.  Performed  Maximum sterile technique was used including gloves.  MAC 4    Evaluation Hemodynamic Status: BP stable throughout; O2 sats: stable throughout and currently acceptable Patient's Current Condition: unstable Complications: No apparent complications Patient did tolerate procedure well. Chest X-ray ordered to verify placement.  CXR: pending.  Called to room for Code Blue.  Upon arrival, CPR being performed.  Intubated patient by direct visualization using Mac 4 Blade.   A 7.5 ETT was inserted after 1 attempt, and secured at 21 at the lip.  BBS equal, and easy cap end tidal color change was positive. Ventilated via ambu bag on 100% O2 for transport to ICU.  Placed on vent upon arrival.  Will continue to monitor.   [name] [name] 04/19/2014

## 2014-04-19 NOTE — Progress Notes (Signed)
Central tele notification of asystole at [phone]. Pt found to be pulseless. Code Blue initiated, CPR started.

## 2014-04-19 NOTE — Progress Notes (Signed)
  Date: 04/19/2014  Patient name: [name] [name]  Medical record number: [id number]  Date of birth: 1930-07-18   This patient has been seen and the plan of care was discussed with the house staff. Please see their note for complete details. I concur with their findings with the following additions/corrections: Dr Ellwood Dense turned service over to me today. Ms Nilsen coded about 9 AM, intubated, and transferred to ICU. Dr Titus Mould is now attending of record. He is discussing end of life issues with daughter. IM ([facility] service) will re-assume care if & when needed.   Bartholomew Crews, MD 04/19/2014, 2:26 PM

## 2014-04-19 DEATH — deceased

## 2014-04-20 ENCOUNTER — Inpatient Hospital Stay (HOSPITAL_COMMUNITY): Payer: Medicare HMO

## 2014-04-20 LAB — CBC WITH DIFFERENTIAL/PLATELET
Basophils Absolute: 0 10*3/uL (ref 0.0–0.1)
Basophils Relative: 0 % (ref 0–1)
EOS ABS: 0.2 10*3/uL (ref 0.0–0.7)
Eosinophils Relative: 1 % (ref 0–5)
HCT: 23 % — ABNORMAL LOW (ref 36.0–46.0)
Hemoglobin: 7.6 g/dL — ABNORMAL LOW (ref 12.0–15.0)
LYMPHS ABS: 1.9 10*3/uL (ref 0.7–4.0)
LYMPHS PCT: 13 % (ref 12–46)
MCH: 28.7 pg (ref 26.0–34.0)
MCHC: 33 g/dL (ref 30.0–36.0)
MCV: 86.8 fL (ref 78.0–100.0)
Monocytes Absolute: 1.7 10*3/uL — ABNORMAL HIGH (ref 0.1–1.0)
Monocytes Relative: 11 % (ref 3–12)
NEUTROS PCT: 75 % (ref 43–77)
Neutro Abs: 11.3 10*3/uL — ABNORMAL HIGH (ref 1.7–7.7)
PLATELETS: 232 10*3/uL (ref 150–400)
RBC: 2.65 MIL/uL — AB (ref 3.87–5.11)
RDW: 16.9 % — AB (ref 11.5–15.5)
WBC: 15.1 10*3/uL — AB (ref 4.0–10.5)

## 2014-04-20 LAB — RENAL FUNCTION PANEL
ALBUMIN: 1.5 g/dL — AB (ref 3.5–5.2)
BUN: 180 mg/dL — AB (ref 6–23)
CHLORIDE: 109 meq/L (ref 96–112)
CO2: 19 mEq/L (ref 19–32)
Calcium: 10.6 mg/dL — ABNORMAL HIGH (ref 8.4–10.5)
Creatinine, Ser: 5.81 mg/dL — ABNORMAL HIGH (ref 0.50–1.10)
GFR calc Af Amer: 7 mL/min — ABNORMAL LOW (ref >90)
GFR, EST NON AFRICAN AMERICAN: 6 mL/min — AB (ref >90)
Glucose, Bld: 139 mg/dL — ABNORMAL HIGH (ref 70–99)
PHOSPHORUS: 6.2 mg/dL — AB (ref 2.3–4.6)
POTASSIUM: 5.3 meq/L (ref 3.7–5.3)
SODIUM: 150 meq/L — AB (ref 137–147)

## 2014-04-20 LAB — GLUCOSE, CAPILLARY
GLUCOSE-CAPILLARY: 115 mg/dL — AB (ref 70–99)
GLUCOSE-CAPILLARY: 117 mg/dL — AB (ref 70–99)
Glucose-Capillary: 132 mg/dL — ABNORMAL HIGH (ref 70–99)

## 2014-04-20 MED ORDER — SODIUM CHLORIDE 0.9 % IV SOLN
10.0000 mg/h | INTRAVENOUS | Status: DC
  Administered 2014-04-20 – 2014-04-21 (×2): 2 mg/h via INTRAVENOUS
  Filled 2014-04-20 (×2): qty 10

## 2014-04-20 MED ORDER — MIDAZOLAM BOLUS VIA INFUSION
5.0000 mg | INTRAVENOUS | Status: DC | PRN
  Filled 2014-04-20: qty 20

## 2014-04-20 MED ORDER — MORPHINE SULFATE 10 MG/ML IJ SOLN
10.0000 mg/h | INTRAVENOUS | Status: DC
  Administered 2014-04-20 – 2014-04-21 (×2): 5 mg/h via INTRAVENOUS
  Filled 2014-04-20 (×2): qty 10

## 2014-04-20 MED ORDER — MORPHINE BOLUS VIA INFUSION
5.0000 mg | INTRAVENOUS | Status: DC | PRN
  Filled 2014-04-20: qty 20

## 2014-04-20 MED FILL — Medication: Qty: 1 | Status: AC

## 2014-04-20 NOTE — Progress Notes (Signed)
PULMONARY / CRITICAL CARE MEDICINE   Name: [name] [name] MRN: [id number] DOB: Jul 20, 1930    ADMISSION DATE:  2014-04-24 CONSULTATION DATE:  04/05/14  PRIMARY SERVICE: IMTS  CHIEF COMPLAINT:  AMS  BRIEF PATIENT DESCRIPTION: 80yo female with hx CKD4, HTN, DM, pancreatitis, verterbal osteomyelitis on IV cefepime.  Admitted 5/16 with AMS, continuous EEG revealed status epilepticus thought r/t cefepime and PCCM originally consulted for tx ICU and potential need for ETT/propofol.  This was not needed, pt remained fairly stable (although no improvement AMS), status epilepticus resolved and pt tx to floor, PCCM signed off.  Continued to have ongoing issues with acute on chronic renal failure, AMS.  Goals of care discussion planned for 6/1 given prolonged AMS and poor prognosis. Developed significant diarrhea (CDiff neg) 5/30, with subsequent hypernatremia.  On 6/1 had bradycardic arrest, ~10 mins CPR, intubated, tx ICU and PCCM called back.   SIGNIFICANT EVENTS / STUDIES:  5/27 MRI brain>>> Stable and essentially normal for age non contrast MRI appearance of the brain. 6/1 - code, anoxia, likely hyperkalemia related  LINES / TUBES: RUA PICC (pta)>>> ett 6/1>>>  CULTURES: CDiff PCR 5/23>>>neg Urine 5/16>>> BC x2 5/16>>>neg   ANTIBIOTICS: Daptomycin 5/16>>> Merropenem 5/16>>>   SUBJECTIVE/ INTERVAL Hx:  DNR established poor neur oassessment   VITAL SIGNS: Temp:  [97.7 F (36.5 C)-98.8 F (37.1 C)] 97.7 F (36.5 C) (06/02 0400) Pulse Rate:  [62-81] 77 (06/02 0900) Resp:  [13-28] 19 (06/02 0900) BP: (90-190)/(29-96) 172/47 mmHg (06/02 0900) SpO2:  [100 %] 100 % (06/02 0900) FiO2 (%):  [40 %-100 %] 40 % (06/02 0802) Weight:  [95.2 kg (209 lb 14.1 oz)-96.1 kg (211 lb 13.8 oz)] 96.1 kg (211 lb 13.8 oz) (06/02 0500) HEMODYNAMICS:   VENTILATOR SETTINGS: Vent Mode:  [-] PRVC FiO2 (%):  [40 %-100 %] 40 % Set Rate:  [20 bmp] 20 bmp Vt Set:  [400 mL] 400 mL PEEP:  [5 cmH20] 5  cmH20 Plateau Pressure:  [18 cmH20-21 cmH20] 18 cmH20 INTAKE / OUTPUT: Intake/Output     06/01 0701 - 06/02 0700 06/02 0701 - 06/03 0700   I.V. (mL/kg) 1050 (10.9)    NG/GT 120    IV Piggyback 580    Total Intake(mL/kg) 1750 (18.2)    Urine (mL/kg/hr) 1000 (0.4)    Emesis/NG output 310 (0.1)    Stool     Total Output 1310     Net +440          Stool Occurrence 4 x      PHYSICAL EXAMINATION: General:  Chronically ill appearing female Neuro:  Per 4 sluggish, moves toes slight, not follows commands, no loaclization, min cough, min gag HEENT:  Mm moist, no JVD, ETT Cardiovascular:  s1s2 rrr Lungs:  ronchi  Abdomen:  Soft, non distended, -bs, profuse diarrhea  Musculoskeletal:  Warm and dry, scant BLE edema   LABS:  CBC  Recent Labs Lab 04/19/14 0420 04/19/14 0934 04/20/14 0450  WBC 14.6* 15.4* 15.1*  HGB 7.2* 6.8* 7.6*  HCT 21.3* 20.6* 23.0*  PLT 198 167 232   Coag's No results found for this basename: APTT, INR,  in the last 168 hours BMET  Recent Labs Lab 04/19/14 0420 04/19/14 0934 04/20/14 0450  NA 150* 150* 150*  K 4.9 5.1 5.3  CL 110 109 109  CO2 18* 19 19  BUN 171* 178* 180*  CREATININE 5.70* 5.74* 5.81*  GLUCOSE 186* 197* 139*   Electrolytes  Recent Labs Lab 04/15/14 0610  04/18/14 0531 04/19/14 0420 04/19/14 0934 04/20/14 0450  CALCIUM  --   < > 8.9 9.3 9.3 10.6*  MG 2.6*  --   --   --   --   --   PHOS  --   < > 3.4 4.4  --  6.2*  < > = values in this interval not displayed. Sepsis Markers  Recent Labs Lab 04/15/14 0610 04/19/14 0934  LATICACIDVEN  --  1.0  PROCALCITON 0.67  --    ABG  Recent Labs Lab 04/19/14 1030  PHART 7.306*  PCO2ART 37.0  PO2ART 355.0*   Liver Enzymes  Recent Labs Lab 04/14/14 0500  04/19/14 0420 04/19/14 0934 04/20/14 0450  AST 39*  --   --  100*  --   ALT 24  --   --  46*  --   ALKPHOS 68  --   --  77  --   BILITOT <0.2*  --   --  <0.2*  --   ALBUMIN 1.5*  1.5*  < > 1.5* 1.4* 1.5*  < >  = values in this interval not displayed. Cardiac Enzymes  Recent Labs Lab 04/15/14 0610 04/19/14 1000  TROPONINI  --  <0.30  PROBNP 13216.0*  --    Glucose  Recent Labs Lab 04/19/14 1149 04/19/14 1520 04/19/14 2020 04/19/14 2354 04/20/14 0449 04/20/14 0759  GLUCAP 169* 148* 125* 117* 132* 115*    Imaging Dg Chest Port 1 View  04/20/2014   CLINICAL DATA:  Check endotracheal tube position  EXAM: PORTABLE CHEST - 1 VIEW  COMPARISON:  04/19/2014  FINDINGS: A right-sided PICC line, endotracheal tube and feeding catheter are all noted in satisfactory position. The cardiac shadow is stable. Increasing vascular congestion and pulmonary edema is noted. No focal confluent infiltrate is seen.  IMPRESSION: Tubes and lines as described above.  Increased vascular congestion and pulmonary edema.   Electronically Signed   By: Inez Catalina M.D.   On: 04/20/2014 06:58   Dg Chest Port 1 View  04/19/2014   CLINICAL DATA:  Evaluate endotracheal tube placement, questioning free air within the abdomen  EXAM: PORTABLE CHEST - 1 VIEW  COMPARISON:  04/16/2014; 04/14/2014 12/27/2013; chest CT- 01/19/2014  FINDINGS: Grossly unchanged cardiac silhouette and mediastinal contours with atherosclerotic plaque within the thoracic aorta. Interval intubation with endotracheal tube overlying the tracheal air column with tip superior the chronic. Interval placement of transcutaneous pacer pads overlying the heart. Interval advancement of enteric tube with weighted tip now projecting below the left hemidiaphragm. Stable positioning of right upper extremity approach PICC line with tip projected over the distal SVC. Pulmonary vasculature is indistinct with cephalization of flow. Minimally improved aeration of lung bases with persistent perihilar and medial basilar opacities. No new focal airspace opacities. Trace bilateral effusions are not excluded. 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   IMPRESSION: 1. Interval intubation with endotracheal tube overlying the tracheal air column with tip superior to the carina. No pneumothorax. 2. Interval advancement of enteric tube with the weighted tip now projecting below the left hemidiaphragm. 3. Epicardial pacer pads now overlie the heart. 4. Similar findings of pulmonary edema superimposed on fibrotic change. 5. Minimally improved aeration of the lungs with persistent perihilar and bilateral medial basilar opacities, atelectasis versus infiltrate.   Electronically Signed   By: Sandi Mariscal M.D.   On: 04/19/2014 10:13   Dg Abd Portable 1v  04/19/2014   CLINICAL DATA:  Assess for free air  EXAM: PORTABLE ABDOMEN - 1 VIEW  COMPARISON:  CT 04/13/2014  FINDINGS: Feeding tube is in place with the tip in the proximal jejunum. Nonobstructive bowel gas pattern. No visible free air on this supine image. No organomegaly. Prior cholecystectomy.  IMPRESSION: No evidence of bowel obstruction.  No supine evidence of free air.  Feeding tube tip in the proximal jejunum.   Electronically Signed   By: Rolm Baptise M.D.   On: 04/19/2014 10:12     CXR:  Edema increased, ett wnl  ASSESSMENT / PLAN:  PULMONARY Acute respiratory failure - post bradycardic arrest  Likely hyperkalemia contribution P:   Concern for weaning, at risk arrest hyperkalemia if acidosis  No SBT today, contraindicated Will discuss comfort plan and if for comfort then will assess cpap 5 ps 5 for 1 min to titrate morphine needs Not candidate HD  Diuretics as able  CARDIOVASCULAR bradycardic arrest - ?r/t hyperkalemia v respiratory arrest Acute on chronic dCHF HTN P:  No pressors, will not change outcome Tele May need re add clonidine as some reboud today, if no comfort  RENAL Acute on CKD4 - ?r/t abx.  Baseline Scr ~2 At risk IN from ABX  Hypernatremia, now arrest at risk brain edema Mild hyperkalemia P:   No hd per renal Bicarb drip if continued support May need repeat  kayxlate Chem in am if continued support  GASTROINTESTINAL Diarrhea - CDiff neg. ?etiology of acute onset diarrhea preceding arrest. ?combination TF and abx v underlying pathology such as ischemic bowel, perf, etc.  Hx pancreatitis  protein calorie malnutrition  P:   Npo ppi  HEMATOLOGIC Anemia - presume r/t chronic disease. No obvious source bleeding but hgb trending down P:  prbc will not change outcome scd  INFECTIOUS Osteomyelitis of T10/T11  P:   ID following  abx as above - rec tx x 6 weeks   ENDOCRINE DM   P:   SSI   NEUROLOGIC AMS - initially thought r/t seizures in setting cefepime, then thought ?r/t depakote. Minimal improvement.  Seizures  Anoxic brain injury P:   See pulm Consider comfort care, will discuss with family  Global: will discuss comfort with family I have personally obtained a history, examined the patient, evaluated laboratory and imaging results, formulated the assessment and plan and placed orders. CRITICAL CARE: The patient is critically ill with multiple organ systems failure and requires high complexity decision making for assessment and support, frequent evaluation and titration of therapies, application of advanced monitoring technologies and extensive interpretation of multiple databases. Critical Care Time devoted to patient care services described in this note is 40 minutes.   Lavon Paganini. Titus Mould, MD, [place] Pgr: [place] [facility]

## 2014-04-20 NOTE — Progress Notes (Signed)
Chaplain talked with family per consult.  Family appears to be grieving appropriately at this time, and has mentioned having their own pastoral support.  The chaplain was informed that the family pastor would be here "later on".  Chaplain was speaking with pt family when their emotions response was to take some time outside of the pt's room.  Chaplain assessed this as a healthy response to their grief.  But at that time the chaplain ended the visit with the family.

## 2014-04-20 NOTE — Progress Notes (Signed)
NUTRITION FOLLOW UP  DOCUMENTATION CODES Per approved criteria  -Severe malnutrition in the context of acute illness or injury -Obesity Unspecified   INTERVENTION: If pt unable to be extubated in the next 24-48 hours and family want TF, recommend Vital High Protein via OGT, start at 65m/hr increase by 140mevery 4 hours to goal of 5032mr. This will provide 1200 calories, 105g protein, 1003m73mee water and meet 75% estimated calorie needs (24 kcal/kg ideal body weight) and 100% estimated protein needs. If IVF d/c, recommend 120ml43mer flushes BID RD to follow for nutrition care plan  NUTRITION DIAGNOSIS: Inadequate oral intake related to inability to eat as evidenced by NPO status, ongoing  Goal: Pt to meet >/= 90% of their estimated nutrition needs, not met, TF d/c   If family want TF, goal would be for enteral nutrition to provide 60-70% of estimated calorie needs (22-25 kcals/kg ideal body weight) and 100% of estimated protein needs, based on ASPEN guidelines for permissive underfeeding in critically ill obese individuals  Monitor:  Weights, labs, goals of care  ASSESSMENT: 83 ye89 old female with PMH of HTN, non-insulin dependent Type II DM, CHF, CKD Stage 4, and osteomyelitis; [facility] with [facility].  MRI head without significant disease.  RD spoke with patient's daughter at bedside; report her Mom wasn't eating well for approximately 3 weeks [facility] would consume breakfast (eggs, bacon and coffee), however, not eat lunch or dinner; daughter also reports patient has lost 11 lbs during this time frame; per weight readings, pt has had a 14% weight loss since February 2015 (severe for time frame); patient does like Glucerna Shake oral supplement.  5/28: -Patient transferred to 2W-Cardiac from 2C-Stepdown 5/27. -Vital AF 1.2 formula continues to infuse at 60 ml/hr via NGT (tip in distal stomach) providing 1728 kcal,  108 grams of protein, and 1167 ml of free water. -Neurology notes reviewed.  S/p EEG 5/25.  Patient remains minimally responsive. -RD consulted for low albumin. -Albumin has a half-life of 21 days and is strongly affected by stress response and inflammatory process, therefore, do not expect to see an improvement in this lab value during acute hospitalization.   6/2: -Pt pulled out panda tube partly 5/29, MD gave orders to reinsert tube -Had SLP evaluation 5/30 and was noted to have severe aspiration risk with recommendations for NPO -Pt found to pulseless yesterday, code blue called and CPR started, pt intubated for respiratory insufficiency -TF held yesterday due to diarrhea per CCM MD note, was d/c yesterday morning  -MD discussing end of life issues with family  -Weight up 17 pounds since admission   Patient is currently intubated on ventilator support MV: 9.3 L/min Temp (24hrs), Avg:98.3 F (36.8 C), Min:97.7 F (36.5 C), Max:98.8 F (37.1 C)  Propofol: off   BUN significantly elevated at 180 mg/dL Cr significantly elevated at 5.81 mg/dL GFR very low at 7mL/m40mAST/ALT elevated   Height: Ht Readings from Last 1 Encounters:  04/02/2014 5' 2"  (1.575 m)    Weight: Wt Readings from Last 1 Encounters:  04/20/14 211 lb 13.8 oz (96.1 kg)  Admit wt         194 lb 12.8 oz (88.3 kg) Net I/Os: +7.4L  BMI:  Body mass index is 38.74 kg/(m^2).  Estimated Nutritional Needs: Kcal: 1592 P0100in: 100-110g/day  Fluid: >1.5 L/d  Skin: Intact  Diet Order: NPO   Intake/Output Summary (Last 24 hours) at 04/20/14 0845 Last data filed  at 04/20/14 0700  Gross per 24 hour  Intake   1750 ml  Output   1310 ml  Net    440 ml    Labs:   Recent Labs Lab 04/15/14 0610  04/18/14 0531 04/19/14 0420 04/19/14 0934 04/20/14 0450  NA  --   < > 146 150* 150* 150*  K  --   < > 4.7 4.9 5.1 5.3  CL  --   < > 106 110 109 109  CO2  --   < > 18* 18* 19 19  BUN  --   < > 167* 171* 178*  180*  CREATININE  --   < > 5.94* 5.70* 5.74* 5.81*  CALCIUM  --   < > 8.9 9.3 9.3 10.6*  MG 2.6*  --   --   --   --   --   PHOS  --   < > 3.4 4.4  --  6.2*  GLUCOSE  --   < > 198* 186* 197* 139*  < > = values in this interval not displayed.  CBG (last 3)   Recent Labs  04/19/14 2354 04/20/14 0449 04/20/14 0759  GLUCAP 117* 132* 115*    Scheduled Meds: . antiseptic oral rinse  15 mL Mouth Rinse q12n4p  . chlorhexidine  15 mL Mouth Rinse BID  . DAPTOmycin (CUBICIN)  IV  500 mg Intravenous Q48H  . darbepoetin (ARANESP) injection - NON-DIALYSIS  100 mcg Subcutaneous Q Tue-1800  . heparin subcutaneous  5,000 Units Subcutaneous 3 times per day  . hydrocortisone cream   Topical TID  . insulin aspart  0-15 Units Subcutaneous 6 times per day  . ipratropium-albuterol  3 mL Nebulization Q6H  . meropenem (MERREM) IV  500 mg Intravenous Q24H  . pantoprazole (PROTONIX) IV  40 mg Intravenous Q24H    Continuous Infusions: . sodium chloride 50 mL/hr at 04/20/14 [place], RD, LDN [phone] Pager [phone] Weekend/After Hours Pager

## 2014-04-20 NOTE — Progress Notes (Signed)
Extensive discussion with family son, daughter We discussed the poor prognosis and likely poor quality of life. Family has decided to offer full comfort care. They are aware that the patient may be transferred to palliative care floor for continued comfort care needs. They have been fully updated on the process and expectations.  They want additional family to visit from [place], likely comfort 6 pm wed they are agreeable to starting low dose comfort meds now, including benzo for myoclonus noted by RN  Lavon Paganini. Titus Mould, MD, [place] Pgr: [place] [facility]

## 2014-04-21 NOTE — Progress Notes (Signed)
Wasted 70ml of Morphine and 57ml of versed. [name] [name] [name] Abler Rn/Sondra Altus.

## 2014-04-21 NOTE — Progress Notes (Signed)
On call Md notified by M. Milana Obey of pt's death and family are at bedside.

## 2014-04-21 NOTE — Progress Notes (Signed)
PULMONARY / CRITICAL CARE MEDICINE   Name: [name] [name] MRN: [id number] DOB: [birth year]    ADMISSION DATE:  03/23/2014 CONSULTATION DATE:  04/05/14  PRIMARY SERVICE: IMTS  CHIEF COMPLAINT:  AMS  BRIEF PATIENT DESCRIPTION: 80yo female with hx CKD4, HTN, DM, pancreatitis, verterbal osteomyelitis on IV cefepime.  Admitted 5/16 with AMS, continuous EEG revealed status epilepticus thought r/t cefepime and PCCM originally consulted for tx ICU and potential need for ETT/propofol.  This was not needed, pt remained fairly stable (although no improvement AMS), status epilepticus resolved and pt tx to floor, PCCM signed off.  Continued to have ongoing issues with acute on chronic renal failure, AMS.  Goals of care discussion planned for 6/1 given prolonged AMS and poor prognosis. Developed significant diarrhea (CDiff neg) 5/30, with subsequent hypernatremia.  On 6/1 had bradycardic arrest, ~10 mins CPR, intubated, tx ICU and PCCM called back.   SIGNIFICANT EVENTS / STUDIES:  5/27 MRI brain>>> Stable and essentially normal for age non contrast MRI appearance of the brain. 6/1 - code, anoxia, likely hyperkalemia related 6/2- morphine, versed started  LINES / TUBES: RUA PICC (pta)>>> ett 6/1>>>  CULTURES: CDiff PCR 5/23>>>neg Urine 5/16>>> BC x2 5/16>>>neg  Urine 5/23>>>yeast  ANTIBIOTICS: Daptomycin 5/16>>> Merropenem 5/16>>>   SUBJECTIVE/ INTERVAL Hx:  Comfortable overnight   VITAL SIGNS: Temp:  [97.4 F (36.3 C)-98.2 F (36.8 C)] 97.4 F (36.3 C) (06/03 0300) Pulse Rate:  [63-80] 70 (06/03 0700) Resp:  [18-23] 20 (06/03 0700) BP: (112-189)/(35-65) 121/38 mmHg (06/03 0600) SpO2:  [100 %] 100 % (06/03 0700) FiO2 (%):  [40 %] 40 % (06/03 0700) HEMODYNAMICS:   VENTILATOR SETTINGS: Vent Mode:  [-] PRVC FiO2 (%):  [40 %] 40 % Set Rate:  [20 bmp] 20 bmp Vt Set:  [400 mL] 400 mL PEEP:  [5 cmH20] 5 cmH20 Plateau Pressure:  [17 cmH20-22 cmH20] 20 cmH20 INTAKE /  OUTPUT: Intake/Output     06/02 0701 - 06/03 0700 06/03 0701 - 06/04 0700   I.V. (mL/kg) 1347 (14)    NG/GT 30    IV Piggyback     Total Intake(mL/kg) 1377 (14.3)    Urine (mL/kg/hr) 875 (0.4)    Emesis/NG output 50 (0)    Total Output 925     Net +452            PHYSICAL EXAMINATION: General:  Chronically ill appearing female Neuro:  Per 3 sluggish, moves toes no changes, not follows commands, no loaclization, min cough, no gag HEENT: no JVD, ETT Cardiovascular:  s1s2 rrr Lungs:  coarse Abdomen:  Soft, non distended, no stool anymore, BS hypo  Musculoskeletal:  Warm and dry, scant BLE edema   LABS:  CBC  Recent Labs Lab 04/19/14 0420 04/19/14 0934 04/20/14 0450  WBC 14.6* 15.4* 15.1*  HGB 7.2* 6.8* 7.6*  HCT 21.3* 20.6* 23.0*  PLT 198 167 232   Coag's No results found for this basename: APTT, INR,  in the last 168 hours BMET  Recent Labs Lab 04/19/14 0420 04/19/14 0934 04/20/14 0450  NA 150* 150* 150*  K 4.9 5.1 5.3  CL 110 109 109  CO2 18* 19 19  BUN 171* 178* 180*  CREATININE 5.70* 5.74* 5.81*  GLUCOSE 186* 197* 139*   Electrolytes  Recent Labs Lab 04/15/14 0610  04/18/14 0531 04/19/14 0420 04/19/14 0934 04/20/14 0450  CALCIUM  --   < > 8.9 9.3 9.3 10.6*  MG 2.6*  --   --   --   --   --  PHOS  --   < > 3.4 4.4  --  6.2*  < > = values in this interval not displayed. Sepsis Markers  Recent Labs Lab 04/15/14 0610 04/19/14 0934  LATICACIDVEN  --  1.0  PROCALCITON 0.67  --    ABG  Recent Labs Lab 04/19/14 1030  PHART 7.306*  PCO2ART 37.0  PO2ART 355.0*   Liver Enzymes  Recent Labs Lab 04/19/14 0420 04/19/14 0934 04/20/14 0450  AST  --  100*  --   ALT  --  46*  --   ALKPHOS  --  77  --   BILITOT  --  <0.2*  --   ALBUMIN 1.5* 1.4* 1.5*   Cardiac Enzymes  Recent Labs Lab 04/15/14 0610 04/19/14 1000  TROPONINI  --  <0.30  PROBNP 13216.0*  --    Glucose  Recent Labs Lab 04/19/14 1149 04/19/14 1520 04/19/14 2020  04/19/14 2354 04/20/14 0449 04/20/14 0759  GLUCAP 169* 148* 125* 117* 132* 115*    Imaging Dg Chest Port 1 View  04/20/2014   CLINICAL DATA:  Check endotracheal tube position  EXAM: PORTABLE CHEST - 1 VIEW  COMPARISON:  04/19/2014  FINDINGS: A right-sided PICC line, endotracheal tube and feeding catheter are all noted in satisfactory position. The cardiac shadow is stable. Increasing vascular congestion and pulmonary edema is noted. No focal confluent infiltrate is seen.  IMPRESSION: Tubes and lines as described above.  Increased vascular congestion and pulmonary edema.   Electronically Signed   By: Inez Catalina M.D.   On: 04/20/2014 06:58   Dg Chest Port 1 View  04/19/2014   CLINICAL DATA:  Evaluate endotracheal tube placement, questioning free air within the abdomen  EXAM: PORTABLE CHEST - 1 VIEW  COMPARISON:  04/16/2014; 04/14/2014 12/27/2013; chest CT- 01/19/2014  FINDINGS: Grossly unchanged cardiac silhouette and mediastinal contours with atherosclerotic plaque within the thoracic aorta. Interval intubation with endotracheal tube overlying the tracheal air column with tip superior the chronic. Interval placement of transcutaneous pacer pads overlying the heart. Interval advancement of enteric tube with weighted tip now projecting below the left hemidiaphragm. Stable positioning of right upper extremity approach PICC line with tip projected over the distal SVC. Pulmonary vasculature is indistinct with cephalization of flow. Minimally improved aeration of lung bases with persistent perihilar and medial basilar opacities. No new focal airspace opacities. Trace bilateral effusions are not excluded. 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  IMPRESSION: 1. Interval intubation with endotracheal tube overlying the tracheal air column with tip superior to the carina. No pneumothorax. 2. Interval advancement of enteric tube with the weighted tip now projecting below the  left hemidiaphragm. 3. Epicardial pacer pads now overlie the heart. 4. Similar findings of pulmonary edema superimposed on fibrotic change. 5. Minimally improved aeration of the lungs with persistent perihilar and bilateral medial basilar opacities, atelectasis versus infiltrate.   Electronically Signed   By: Sandi Mariscal M.D.   On: 04/19/2014 10:13   Dg Abd Portable 1v  04/19/2014   CLINICAL DATA:  Assess for free air  EXAM: PORTABLE ABDOMEN - 1 VIEW  COMPARISON:  CT 04/13/2014  FINDINGS: Feeding tube is in place with the tip in the proximal jejunum. Nonobstructive bowel gas pattern. No visible free air on this supine image. No organomegaly. Prior cholecystectomy.  IMPRESSION: No evidence of bowel obstruction.  No supine evidence of free air.  Feeding tube tip in the proximal jejunum.   Electronically Signed   By: Lennette Bihari  [place] M.D.   On: 04/19/2014 10:12     CXR:  Edema increased, ett wnl  ASSESSMENT / PLAN:  PULMONARY Acute respiratory failure - post bradycardic arrest  Likely hyperkalemia contribution P:   For ocmofrt in pm Will assess cpap5 ps5 for 1 min now with RT Then totrate morphine to rr 12-18 Ensure rate set at 12 for titration  will discuss process again with family Will repeat titration with RT / RN until comfortable  CARDIOVASCULAR bradycardic arrest - ?r/t hyperkalemia v respiratory arrest Acute on chronic dCHF HTN P:  No pressors, will not change outcome Tele  RENAL Acute on CKD4 - ?r/t abx.  Baseline Scr ~2 At risk IN from ABX  Hypernatremia, now arrest at risk brain edema Mild hyperkalemia P:   No bmets  GASTROINTESTINAL Diarrhea - CDiff neg. ?etiology of acute onset diarrhea preceding arrest. ?combination TF and abx v underlying pathology such as ischemic bowel, perf, etc.  Hx pancreatitis  protein calorie malnutrition  P:   Npo ppif stools noted, add pancreatic enzymes for comfort    INFECTIOUS Osteomyelitis of T10/T11  P:   ID following  abx  off  ENDOCRINE DM   P:   SSI dc   NEUROLOGIC AMS - initially thought r/t seizures in setting cefepime, then thought ?r/t depakote. Minimal improvement.  Seizures  Anoxic brain injury P:   Versed for myoclonus moprhine to titrate, see pulm i obtained records from [place] PCP as directed by family   Global: will discuss comfort with family I have personally obtained a history, examined the patient, evaluated laboratory and imaging results, formulated the assessment and plan and placed orders. CRITICAL CARE: The patient is critically ill with multiple organ systems failure and requires high complexity decision making for assessment and support, frequent evaluation and titration of therapies, application of advanced monitoring technologies and extensive interpretation of multiple databases. Critical Care Time devoted to patient care services described in this note is 30 minutes.   Lavon Paganini. Titus Mould, MD, [place] Pgr: [place] [facility]

## 2014-04-21 NOTE — Procedures (Signed)
Extubation Procedure Note  Patient Details:   Name: [name] [name] DOB: [birth year] MRN: [id number]   Airway Documentation:  Airway 7.5 mm (Active)  Secured at (cm) 24 cm 04/25/2014  8:07 AM  Measured From Lips 05/12/2014  8:07 AM  [place] 04/27/2014  8:07 AM  Secured By [employer] 04/27/2014  8:07 AM  Tube Holder Repositioned Yes 04/19/2014  8:07 AM  Site Condition Dry 04/28/2014  8:07 AM    Evaluation  O2 sats: terminal extubation to room air Complications: No apparent complications Patient did tolerate procedure well. Bilateral Breath Sounds: Diminished;Clear Suctioning: Airway No  Patient extubated at this time per MD order. Order was placed on 04/20/2014 at 1003, but we have been waiting on family. Patients respirations are very shallow and slow. Family at bedside. RT will monitor as needed Saunders Glance , 10:20 AM

## 2014-04-21 NOTE — Progress Notes (Signed)
NUTRITION FOLLOW UP  DOCUMENTATION CODES Per approved criteria  -Severe malnutrition in the context of acute illness or injury -Obesity Unspecified   INTERVENTION: Plan is for full comfort care, nutrition signing off, please consult if needed   NUTRITION DIAGNOSIS: Inadequate oral intake related to inability to eat as evidenced by NPO status, ongoing  Goal: Pt to meet >/= 90% of their estimated nutrition needs, not met, TF d/c    ASSESSMENT: 80 year old female with PMH of HTN, non-insulin dependent Type II DM, CHF, CKD Stage 4, and osteomyelitis; presented from [place] facility with altered mental status.  MRI head without significant disease.  RD spoke with patient's daughter at bedside; report her Mom wasn't eating well for approximately 3 weeks PTA (at [facility]); pt would consume breakfast (eggs, bacon and coffee), however, not eat lunch or dinner; daughter also reports patient has lost 11 lbs during this time frame; per weight readings, pt has had a 14% weight loss since February 2015 (severe for time frame); patient does like Glucerna Shake oral supplement.  5/28: -Patient transferred to 2W-Cardiac from 2C-Stepdown 5/27. -Vital AF 1.2 formula continues to infuse at 60 ml/hr via NGT (tip in distal stomach) providing 1728 kcal, 108 grams of protein, and 1167 ml of free water. -Neurology notes reviewed.  S/p EEG 5/25.  Patient remains minimally responsive. -RD consulted for low albumin. -Albumin has a half-life of 21 days and is strongly affected by stress response and inflammatory process, therefore, do not expect to see an improvement in this lab value during acute hospitalization.   6/2: -Pt pulled out panda tube partly 5/29, MD gave orders to reinsert tube -Had SLP evaluation 5/30 and was noted to have severe aspiration risk with recommendations for NPO -Pt found to pulseless yesterday, code blue called and CPR started, pt intubated for respiratory  insufficiency -TF held yesterday due to diarrhea per CCM MD note, was d/c yesterday morning  -MD discussing end of life issues with family  -Weight up 17 pounds since admission   6/3: -Per CCM notes, family decided yesterday for full comfort care -Pancreatic enzymes added this morning for comfort by CCM -Extubated this morning    Height: Ht Readings from Last 1 Encounters:  03/26/2014 5' 2"  (1.575 m)    Weight: Wt Readings from Last 1 Encounters:  04/20/14 211 lb 13.8 oz (96.1 kg)  Admit wt         194 lb 12.8 oz (88.3 kg) Net I/Os: +7.4L  BMI:  Body mass index is 38.74 kg/(m^2).  Estimated Nutritional Needs: Kcal: 1300-1500 Protein: 60-80g/day  Fluid: >1.5 L/d  Skin: Intact  Diet Order: NPO   Intake/Output Summary (Last 24 hours) at 05/02/2014 1121 Last data filed at 05/09/2014 0900  Gross per 24 hour  Intake   1198 ml  Output    925 ml  Net    273 ml    Labs:   Recent Labs Lab 04/15/14 0610  04/18/14 0531 04/19/14 0420 04/19/14 0934 04/20/14 0450  NA  --   < > 146 150* 150* 150*  K  --   < > 4.7 4.9 5.1 5.3  CL  --   < > 106 110 109 109  CO2  --   < > 18* 18* 19 19  BUN  --   < > 167* 171* 178* 180*  CREATININE  --   < > 5.94* 5.70* 5.74* 5.81*  CALCIUM  --   < > 8.9 9.3 9.3 10.6*  MG 2.6*  --   --   --   --   --   PHOS  --   < > 3.4 4.4  --  6.2*  GLUCOSE  --   < > 198* 186* 197* 139*  < > = values in this interval not displayed.  CBG (last 3)   Recent Labs  04/19/14 2354 04/20/14 0449 04/20/14 0759  GLUCAP 117* 132* 115*    Scheduled Meds: . antiseptic oral rinse  15 mL Mouth Rinse q12n4p  . chlorhexidine  15 mL Mouth Rinse BID  . DAPTOmycin (CUBICIN)  IV  500 mg Intravenous Q48H  . darbepoetin (ARANESP) injection - NON-DIALYSIS  100 mcg Subcutaneous Q Tue-1800  . heparin subcutaneous  5,000 Units Subcutaneous 3 times per day  . hydrocortisone cream   Topical TID  . ipratropium-albuterol  3 mL Nebulization Q6H  . meropenem (MERREM) IV   500 mg Intravenous Q24H  . pantoprazole (PROTONIX) IV  40 mg Intravenous Q24H    Continuous Infusions: . sodium chloride 10 mL/hr at 05/05/2014 0800  . midazolam (VERSED) infusion 2 mg/hr (04/28/2014 0800)  . morphine 2 mg/hr (05/07/2014 0800)    Glory Rosebush MS, RD, LDN [phone] Pager [phone] Weekend/After Hours Pager

## 2014-04-26 ENCOUNTER — Ambulatory Visit: Payer: Medicare HMO | Admitting: Infectious Disease

## 2014-04-29 NOTE — Discharge Summary (Signed)
[name] [name], [name] [name] NO.:  [id number]  MEDICAL RECORD NO.:  [id number]  LOCATION:                                 FACILITY:  PHYSICIAN:  Raylene Miyamoto, MD DATE OF BIRTH:  27-Nov-1929  DATE OF ADMISSION:  04/06/2014 DATE OF DISCHARGE:  05/06/14                              DISCHARGE SUMMARY   DEATH SUMMARY:  HISTORY OF PRESENT ILLNESS:  This is an 80 year old female with history of CKD stage IV, hypertension, diabetes, pancreatitis, and vertebral osteomyelitis diagnosed sometime in January here in  [place] initially from [place], on cefepime IV treatment.  Admitted on Apr 03, 2014, with acute mental status changes.  Continuous EEG revealed status epilepticus thought to be secondary to the beta-lactam.  PCCM was emergently consulted for transfer to the ICU and potential need for endotracheal intubation or propofol.  The patient did not require endotracheal intubation or propofol resolved her seizures, was transferred to the floor.  PCCM signed off at that time.  The patient continued to have ongoing issues with acute on chronic renal failure and acute mental status changes.  The patient developed diarrhea on Apr 17, 2014, and subsequently hypernatremia.  On April 19, 2014, had bradycardic arrest for 10 minutes minimum, CPR intubated on the floor, was transferred to the ICU.  Studies for the patient's hospitalization including MRI of the brain showing stable and essentially normal findings for age.  Noncontrast MRI appearance of the brain.  On April 19, 2014, the patient had a cardiac arrest as described above likely secondary to hyperkalemia and worsening renal insufficiency related and respiratory insufficiency complicating the hyperkalemia, although potassium levels were not documented.  On April 24, 2014, continued discussions were had with the family with worsening neurologic improvements.  The patient was suffering from severe anoxic brain injury  secondary to cardiac arrest with multiorgan dysfunction syndrome and likely very poor outcome from a functional quality of life.  Discussions were had with the entire family, who opted for comfort care, which was provided and the patient expired.  FINAL DIAGNOSES UPON DEATH: 1. Severe anoxic brain injury. 2. Status post cardiac arrest to rule out secondary to hyperkalemia     secondary to respiratory insufficiency, acidosis, and renal     insufficiency. 3. Acute on chronic renal failure. 4. Status epilepticus early on admission secondary to likely beta-     lactam administration. 5. Vertebral osteomyelitis.     Raylene Miyamoto, MD    DJF/MEDQ  D:  04/28/2014  T:  04/29/2014  Job:  [id number]

## 2014-05-14 ENCOUNTER — Telehealth: Payer: Self-pay | Admitting: *Deleted

## 2014-05-14 NOTE — Telephone Encounter (Signed)
Deceased Pt's daughter requesting to speak w/ Dr. Tommy Medal about her mother's medical diagnoses.  RN let the daughter know that the message would be sent to Dr. Tommy Medal.

## 2014-05-19 NOTE — Telephone Encounter (Signed)
[name] [name] what is the phone number for the daughter ?  I actually only know what happened BEFORE pt died in the hospital. She apparently had problems with seizures that may or may not have been related to the abx. Dr. Johnnye Sima actually followed her during her stay but I am happy to try to answer her questions

## 2014-05-19 DEATH — deceased

## 2014-05-20 NOTE — Telephone Encounter (Signed)
Daughter, Rockie Neighbours phone is [phone].

## 2015-07-31 IMAGING — CT CT HEAD W/O CM
1 of 3 series · 13 of 30 positions shown, 17 images · non-contrast
Comparison: None.

CLINICAL DATA: Altered level of consciousness

EXAM:
CT HEAD WITHOUT CONTRAST
TECHNIQUE: Contiguous axial images were obtained from the base of the skull
through the vertex without intravenous contrast.

[Series 4: head 5.0 h30s · axial · 0.49mm/px · z∈[+367,+492]mm · 13 of 31 slices shown, 17 images]
[im 3/31  brain]
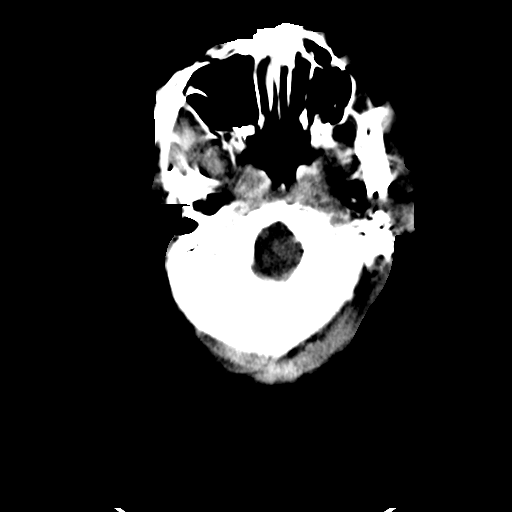
[im 3/31  bone]
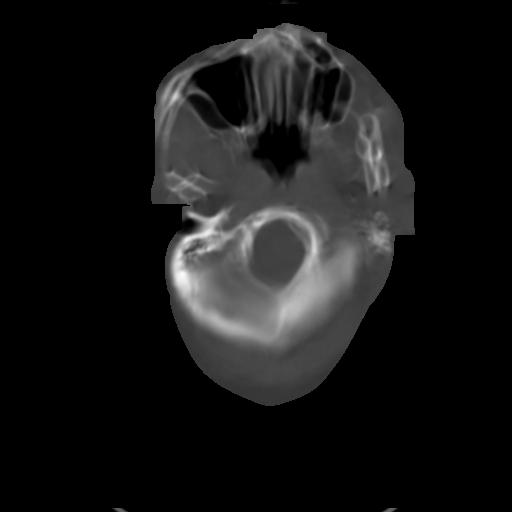
[im 5/31  brain]
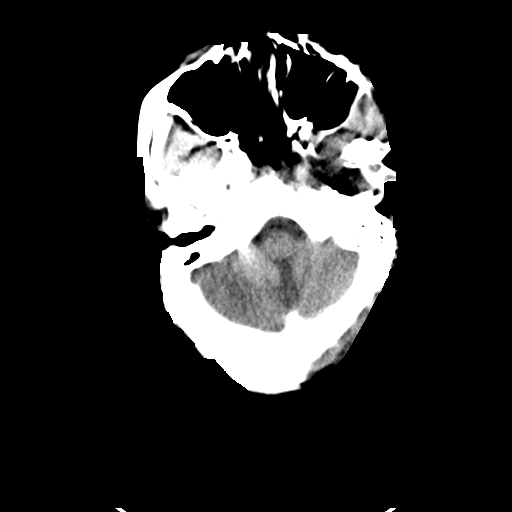
[im 7/31  brain]
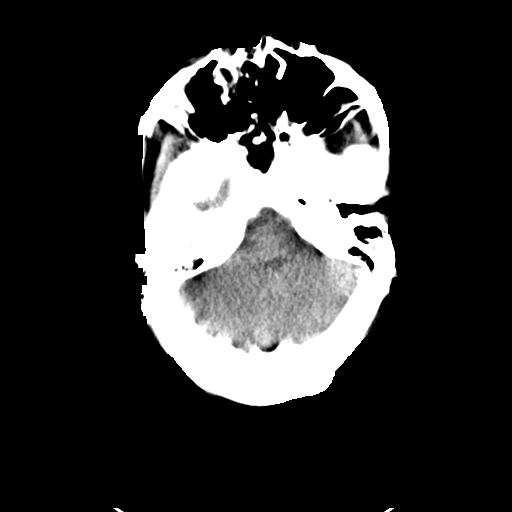
[im 9/31  brain]
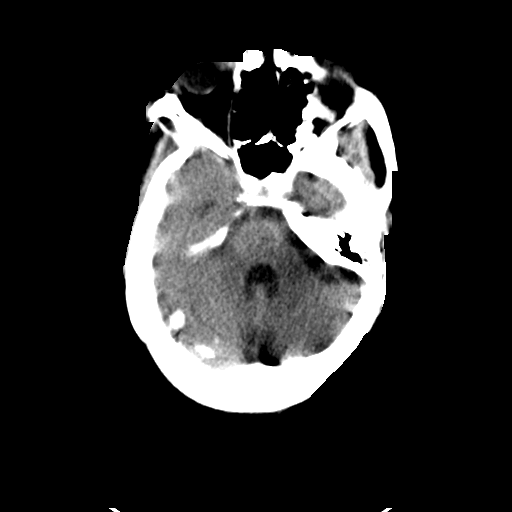
[im 11/31  brain]
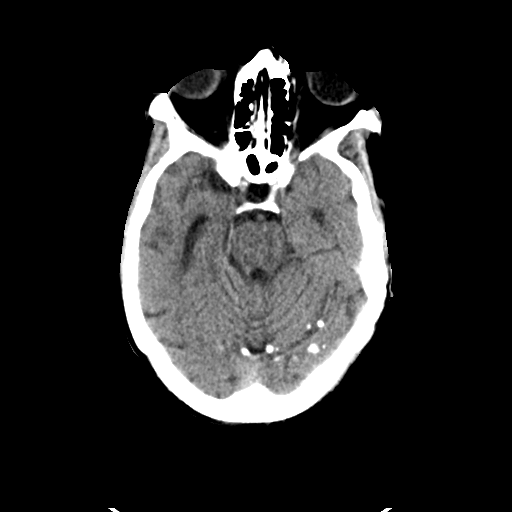
[im 11/31  bone]
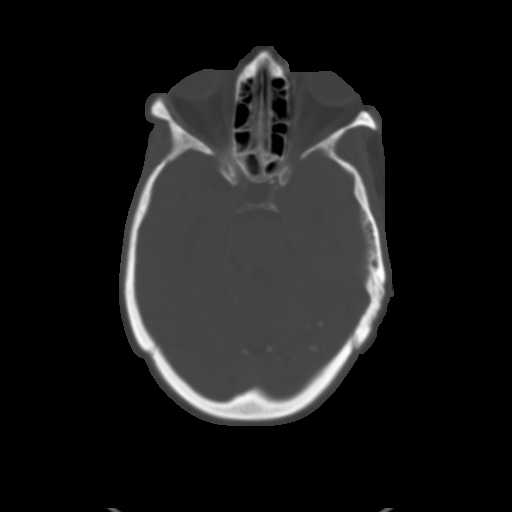
[im 13/31  brain]
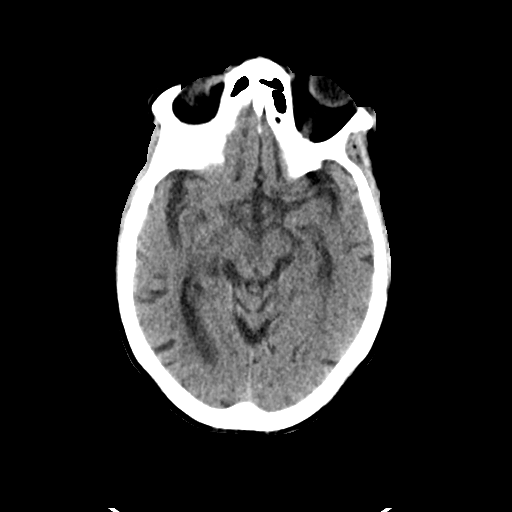
[im 16/31  brain]
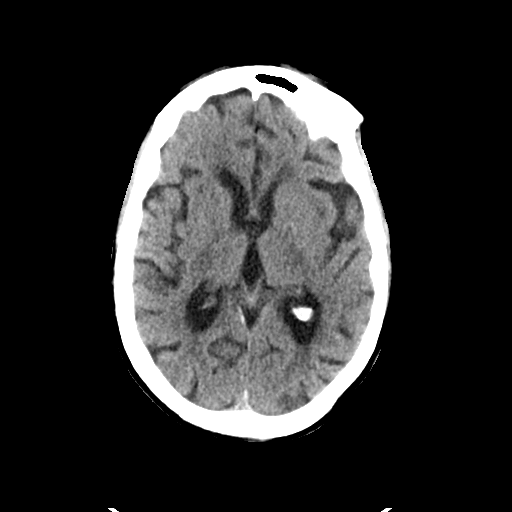
[im 18/31  brain]
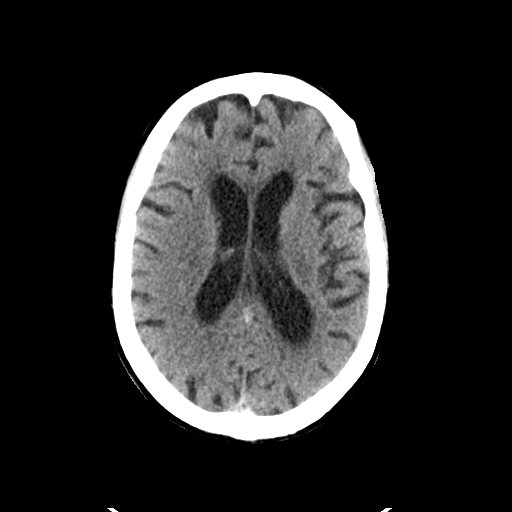
[im 20/31  brain]
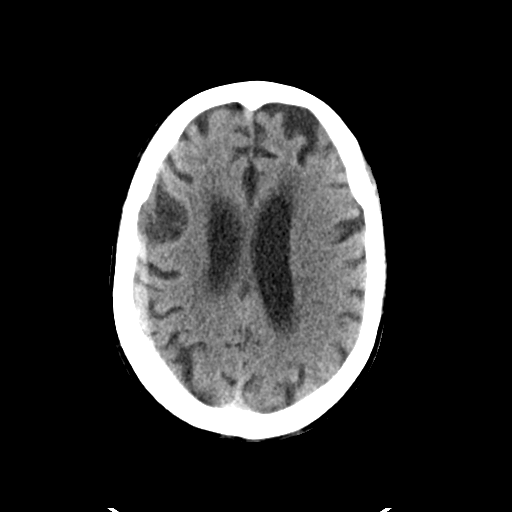
[im 20/31  bone]
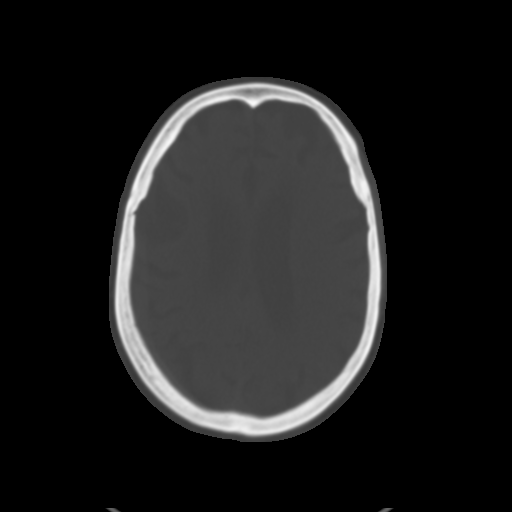
[im 22/31  brain]
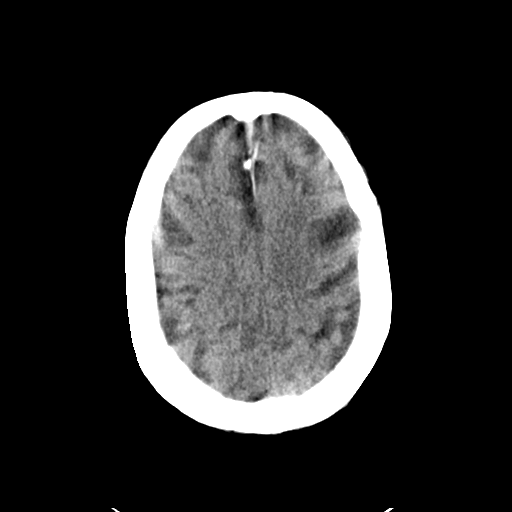
[im 24/31  brain]
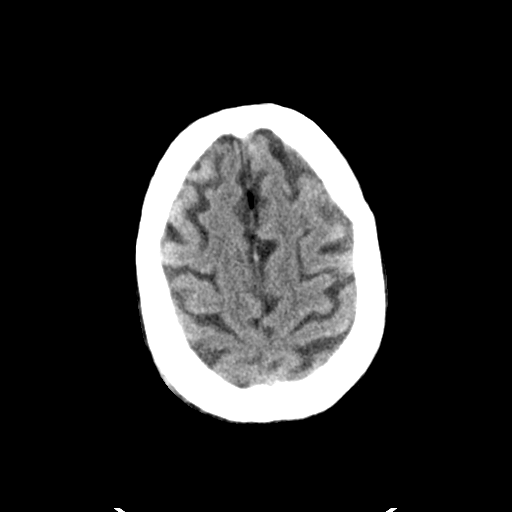
[im 26/31  brain]
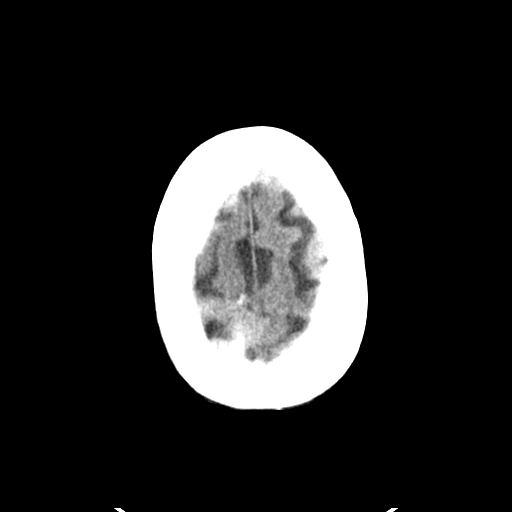
[im 28/31  brain]
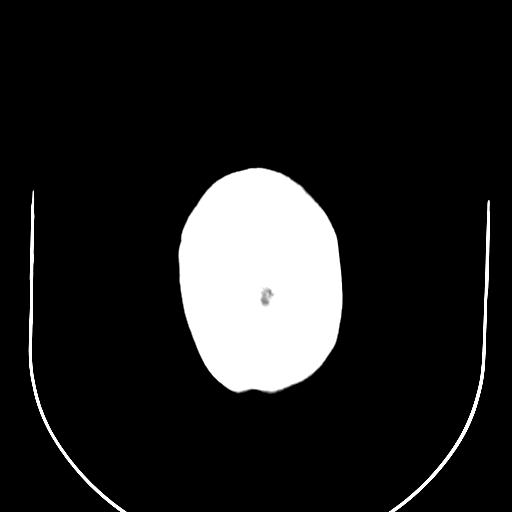
[im 28/31  bone]
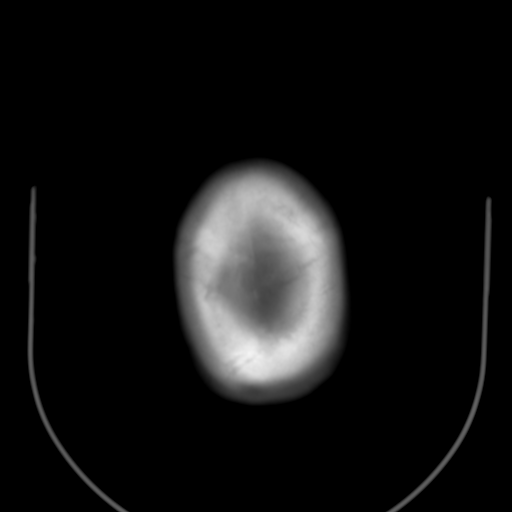

[13 of 30 positions shown; findings below may reference images not displayed]

FINDINGS: Global atrophy. Chronic ischemic changes in the periventricular
white matter.

Motion artifact severely degrades the study despite multiple
attempts at scanning.

Multiple calcifications are seen within the region of the tentorium.
These are likely all extra-axial calcifications. This is difficult
to confirm on axial imaging only.

No mass effect, midline shift, or acute intracranial hemorrhage. No
obvious skull fracture. Go mucous material is present in the
maxillary sinuses.
IMPRESSION: No acute intracranial pathology. Chronic ischemic changes. Motion
artifact degrades the study
